# Patient Record
Sex: Male | Born: 1950 | Race: White | Hispanic: No | Marital: Married | Smoking: Former smoker
Health system: Southern US, Community
[De-identification: ages and names within clinical notes are randomized; demographics above are authoritative.]

## PROBLEM LIST (undated history)

## (undated) DIAGNOSIS — K648 Other hemorrhoids: Secondary | ICD-10-CM

## (undated) DIAGNOSIS — Z8619 Personal history of other infectious and parasitic diseases: Secondary | ICD-10-CM

## (undated) DIAGNOSIS — R972 Elevated prostate specific antigen [PSA]: Secondary | ICD-10-CM

## (undated) DIAGNOSIS — K5790 Diverticulosis of intestine, part unspecified, without perforation or abscess without bleeding: Secondary | ICD-10-CM

## (undated) DIAGNOSIS — M1990 Unspecified osteoarthritis, unspecified site: Secondary | ICD-10-CM

## (undated) DIAGNOSIS — G629 Polyneuropathy, unspecified: Secondary | ICD-10-CM

## (undated) DIAGNOSIS — G3184 Mild cognitive impairment, so stated: Secondary | ICD-10-CM

## (undated) DIAGNOSIS — F32A Depression, unspecified: Secondary | ICD-10-CM

## (undated) DIAGNOSIS — F419 Anxiety disorder, unspecified: Secondary | ICD-10-CM

## (undated) DIAGNOSIS — R29898 Other symptoms and signs involving the musculoskeletal system: Secondary | ICD-10-CM

## (undated) DIAGNOSIS — K649 Unspecified hemorrhoids: Secondary | ICD-10-CM

## (undated) DIAGNOSIS — M542 Cervicalgia: Secondary | ICD-10-CM

## (undated) DIAGNOSIS — K449 Diaphragmatic hernia without obstruction or gangrene: Secondary | ICD-10-CM

## (undated) DIAGNOSIS — E785 Hyperlipidemia, unspecified: Secondary | ICD-10-CM

## (undated) DIAGNOSIS — C801 Malignant (primary) neoplasm, unspecified: Secondary | ICD-10-CM

## (undated) DIAGNOSIS — F329 Major depressive disorder, single episode, unspecified: Secondary | ICD-10-CM

## (undated) DIAGNOSIS — K5710 Diverticulosis of small intestine without perforation or abscess without bleeding: Secondary | ICD-10-CM

## (undated) DIAGNOSIS — G8929 Other chronic pain: Secondary | ICD-10-CM

## (undated) HISTORY — DX: Unspecified hemorrhoids: K64.9

## (undated) HISTORY — PX: APPENDECTOMY: SHX54

## (undated) HISTORY — PX: ANTERIOR FUSION CERVICAL SPINE: SUR626

## (undated) HISTORY — PX: REPLACEMENT TOTAL KNEE: SUR1224

## (undated) HISTORY — PX: TONSILLECTOMY AND ADENOIDECTOMY: SUR1326

## (undated) HISTORY — PX: CHOLECYSTECTOMY: SHX55

## (undated) HISTORY — DX: Depression, unspecified: F32.A

## (undated) HISTORY — PX: BACK SURGERY: SHX140

## (undated) HISTORY — DX: Diverticulosis of small intestine without perforation or abscess without bleeding: K57.10

## (undated) HISTORY — PX: JOINT REPLACEMENT: SHX530

## (undated) HISTORY — DX: Diverticulosis of intestine, part unspecified, without perforation or abscess without bleeding: K57.90

## (undated) HISTORY — DX: Other hemorrhoids: K64.8

## (undated) HISTORY — PX: FRACTURE SURGERY: SHX138

## (undated) HISTORY — DX: Diaphragmatic hernia without obstruction or gangrene: K44.9

## (undated) HISTORY — DX: Major depressive disorder, single episode, unspecified: F32.9

## (undated) HISTORY — PX: SHOULDER SURGERY: SHX246

---

## 1988-05-24 HISTORY — PX: ELBOW FRACTURE SURGERY: SHX616

## 2002-05-24 HISTORY — PX: ELBOW FRACTURE SURGERY: SHX616

## 2003-05-25 HISTORY — PX: TOTAL KNEE ARTHROPLASTY: SHX125

## 2004-06-22 ENCOUNTER — Ambulatory Visit (HOSPITAL_COMMUNITY): Admission: RE | Admit: 2004-06-22 | Discharge: 2004-06-22 | Payer: Self-pay | Admitting: Neurosurgery

## 2005-01-15 ENCOUNTER — Encounter: Admission: RE | Admit: 2005-01-15 | Discharge: 2005-01-15 | Payer: Self-pay | Admitting: Orthopedic Surgery

## 2005-01-19 ENCOUNTER — Ambulatory Visit (HOSPITAL_COMMUNITY): Admission: RE | Admit: 2005-01-19 | Discharge: 2005-01-19 | Payer: Self-pay | Admitting: Orthopedic Surgery

## 2005-02-12 ENCOUNTER — Ambulatory Visit (HOSPITAL_COMMUNITY): Admission: RE | Admit: 2005-02-12 | Discharge: 2005-02-12 | Payer: Self-pay | Admitting: Chiropractic Medicine

## 2005-02-15 ENCOUNTER — Ambulatory Visit (HOSPITAL_COMMUNITY): Admission: RE | Admit: 2005-02-15 | Discharge: 2005-02-15 | Payer: Self-pay | Admitting: Sports Medicine

## 2006-05-24 HISTORY — PX: CERVICAL DISCECTOMY: SHX98

## 2007-07-21 ENCOUNTER — Inpatient Hospital Stay (HOSPITAL_COMMUNITY): Admission: AD | Admit: 2007-07-21 | Discharge: 2007-07-25 | Payer: Self-pay | Admitting: Neurosurgery

## 2007-09-21 ENCOUNTER — Encounter: Admission: RE | Admit: 2007-09-21 | Discharge: 2007-09-21 | Payer: Self-pay | Admitting: Neurosurgery

## 2007-09-22 ENCOUNTER — Inpatient Hospital Stay (HOSPITAL_COMMUNITY): Admission: AD | Admit: 2007-09-22 | Discharge: 2007-10-27 | Payer: Self-pay | Admitting: Neurosurgery

## 2007-09-23 ENCOUNTER — Ambulatory Visit: Payer: Self-pay | Admitting: Internal Medicine

## 2007-11-14 ENCOUNTER — Ambulatory Visit: Payer: Self-pay | Admitting: Infectious Diseases

## 2007-11-14 DIAGNOSIS — M8618 Other acute osteomyelitis, other site: Secondary | ICD-10-CM | POA: Insufficient documentation

## 2007-11-14 LAB — CONVERTED CEMR LAB
CO2: 27 meq/L (ref 19–32)
CRP: 0.1 mg/dL (ref <0.6)
Creatinine, Ser: 0.84 mg/dL (ref 0.40–1.50)
Eosinophils Absolute: 1.9 10*3/uL — ABNORMAL HIGH (ref 0.0–0.7)
Eosinophils Relative: 22 % — ABNORMAL HIGH (ref 0–5)
Glucose, Bld: 86 mg/dL (ref 70–99)
HCT: 39.1 % (ref 39.0–52.0)
Lymphs Abs: 2.6 10*3/uL (ref 0.7–4.0)
MCV: 92.7 fL (ref 78.0–100.0)
Platelets: 276 10*3/uL (ref 150–400)
Sed Rate: 4 mm/hr (ref 0–16)
Total Bilirubin: 0.3 mg/dL (ref 0.3–1.2)
WBC: 8.4 10*3/uL (ref 4.0–10.5)

## 2007-11-15 ENCOUNTER — Ambulatory Visit: Payer: Self-pay | Admitting: Infectious Diseases

## 2007-11-16 ENCOUNTER — Encounter: Payer: Self-pay | Admitting: Infectious Diseases

## 2007-11-16 ENCOUNTER — Emergency Department (HOSPITAL_COMMUNITY): Admission: EM | Admit: 2007-11-16 | Discharge: 2007-11-17 | Payer: Self-pay | Admitting: Emergency Medicine

## 2007-11-29 ENCOUNTER — Encounter: Payer: Self-pay | Admitting: Neurosurgery

## 2007-12-05 ENCOUNTER — Telehealth: Payer: Self-pay | Admitting: Infectious Diseases

## 2007-12-14 ENCOUNTER — Ambulatory Visit: Payer: Self-pay | Admitting: Infectious Diseases

## 2008-01-12 ENCOUNTER — Ambulatory Visit: Payer: Self-pay | Admitting: Unknown Physician Specialty

## 2008-01-18 ENCOUNTER — Encounter: Payer: Self-pay | Admitting: Unknown Physician Specialty

## 2008-01-22 ENCOUNTER — Ambulatory Visit: Payer: Self-pay | Admitting: Unknown Physician Specialty

## 2008-01-23 ENCOUNTER — Encounter: Payer: Self-pay | Admitting: Unknown Physician Specialty

## 2008-01-25 ENCOUNTER — Telehealth: Payer: Self-pay | Admitting: Infectious Diseases

## 2008-02-22 ENCOUNTER — Encounter: Payer: Self-pay | Admitting: Unknown Physician Specialty

## 2008-03-07 ENCOUNTER — Telehealth: Payer: Self-pay | Admitting: Infectious Diseases

## 2008-03-11 ENCOUNTER — Encounter: Admission: RE | Admit: 2008-03-11 | Discharge: 2008-03-11 | Payer: Self-pay | Admitting: Neurosurgery

## 2008-05-02 ENCOUNTER — Ambulatory Visit: Payer: Self-pay | Admitting: Family Medicine

## 2008-09-24 ENCOUNTER — Encounter: Payer: Self-pay | Admitting: Infectious Diseases

## 2009-04-24 ENCOUNTER — Encounter: Admission: RE | Admit: 2009-04-24 | Discharge: 2009-04-24 | Payer: Self-pay | Admitting: Neurosurgery

## 2009-05-13 ENCOUNTER — Encounter: Admission: RE | Admit: 2009-05-13 | Discharge: 2009-05-13 | Payer: Self-pay | Admitting: Neurosurgery

## 2009-05-20 ENCOUNTER — Ambulatory Visit: Payer: Self-pay | Admitting: Sports Medicine

## 2009-05-24 HISTORY — PX: ANTERIOR FUSION CERVICAL SPINE: SUR626

## 2009-05-30 ENCOUNTER — Inpatient Hospital Stay (HOSPITAL_COMMUNITY): Admission: RE | Admit: 2009-05-30 | Discharge: 2009-06-04 | Payer: Self-pay | Admitting: Neurosurgery

## 2009-07-28 ENCOUNTER — Encounter: Admission: RE | Admit: 2009-07-28 | Discharge: 2009-07-28 | Payer: Self-pay | Admitting: Neurosurgery

## 2009-08-12 ENCOUNTER — Ambulatory Visit: Payer: Self-pay | Admitting: Internal Medicine

## 2009-08-13 ENCOUNTER — Ambulatory Visit: Payer: Self-pay | Admitting: Internal Medicine

## 2010-06-13 ENCOUNTER — Encounter: Payer: Self-pay | Admitting: Neurosurgery

## 2010-06-14 ENCOUNTER — Encounter: Payer: Self-pay | Admitting: Orthopedic Surgery

## 2010-06-14 ENCOUNTER — Encounter: Payer: Self-pay | Admitting: Family Medicine

## 2010-08-04 ENCOUNTER — Ambulatory Visit: Payer: Self-pay | Admitting: Neurosurgery

## 2010-08-09 LAB — APTT: aPTT: 31 seconds (ref 24–37)

## 2010-08-09 LAB — CBC
HCT: 37 % — ABNORMAL LOW (ref 39.0–52.0)
Hemoglobin: 12.8 g/dL — ABNORMAL LOW (ref 13.0–17.0)
RBC: 3.91 MIL/uL — ABNORMAL LOW (ref 4.22–5.81)
WBC: 5.3 10*3/uL (ref 4.0–10.5)

## 2010-08-09 LAB — PROTIME-INR: INR: 1.03 (ref 0.00–1.49)

## 2010-08-09 LAB — URINALYSIS, ROUTINE W REFLEX MICROSCOPIC
Bilirubin Urine: NEGATIVE
Ketones, ur: NEGATIVE mg/dL
Nitrite: NEGATIVE
pH: 5.5 (ref 5.0–8.0)

## 2010-08-09 LAB — DIFFERENTIAL
Basophils Absolute: 0 10*3/uL (ref 0.0–0.1)
Basophils Relative: 0 % (ref 0–1)
Eosinophils Absolute: 0.1 10*3/uL (ref 0.0–0.7)
Monocytes Absolute: 0.4 10*3/uL (ref 0.1–1.0)
Neutro Abs: 2.7 10*3/uL (ref 1.7–7.7)
Neutrophils Relative %: 51 % (ref 43–77)

## 2010-08-09 LAB — COMPREHENSIVE METABOLIC PANEL
Alkaline Phosphatase: 104 U/L (ref 39–117)
BUN: 11 mg/dL (ref 6–23)
Chloride: 102 mEq/L (ref 96–112)
Glucose, Bld: 95 mg/dL (ref 70–99)
Potassium: 4.3 mEq/L (ref 3.5–5.1)
Total Bilirubin: 0.2 mg/dL — ABNORMAL LOW (ref 0.3–1.2)

## 2010-08-09 LAB — TYPE AND SCREEN: ABO/RH(D): O POS

## 2010-08-09 LAB — MRSA PCR SCREENING: MRSA by PCR: NEGATIVE

## 2010-08-09 LAB — ABO/RH: ABO/RH(D): O POS

## 2010-08-18 ENCOUNTER — Other Ambulatory Visit: Payer: Self-pay | Admitting: Neurosurgery

## 2010-08-18 DIAGNOSIS — M4722 Other spondylosis with radiculopathy, cervical region: Secondary | ICD-10-CM

## 2010-08-21 ENCOUNTER — Ambulatory Visit
Admission: RE | Admit: 2010-08-21 | Discharge: 2010-08-21 | Disposition: A | Discharge status: Home / Self Care | Payer: 59 | Source: Ambulatory Visit | Attending: Neurosurgery | Admitting: Neurosurgery

## 2010-08-21 ENCOUNTER — Other Ambulatory Visit: Payer: Self-pay | Admitting: Neurosurgery

## 2010-08-21 DIAGNOSIS — M4722 Other spondylosis with radiculopathy, cervical region: Secondary | ICD-10-CM

## 2010-10-06 NOTE — Discharge Summary (Signed)
NAME:  Mike Farley, Mike Farley NO.:  [id number]   MEDICAL RECORD NO.:  [id number]          PATIENT TYPE:  INP   LOCATION:  3019                         FACILITY:  [facility]   PHYSICIAN:  Payton Doughty, M.D.      DATE OF BIRTH:  05-14-1951   DATE OF ADMISSION:  09/23/2007  DATE OF DISCHARGE:  10/27/2007                               DISCHARGE SUMMARY   ADMITTING DIAGNOSES:  1. Esophageal injury.  2. Cervical osteomyelitis.   DISCHARGE DIAGNOSES:  1. Esophageal injury.  2. Cervical osteomyelitis.   PROCEDURES:  1. Incision and drainage.  2. Reexploration of cervical osteomyelitis.  3. Wound change by Dr. Venetia Maxon and Dr. Jearld Fenton.   COMPLICATIONS:  None.   DISCHARGE STATUS:  Alive and well per techs.   HOSPITAL COURSE:  A 60 year old gentleman who underwent anterior  decompression fusion in February.  Initially, he did well, developed  intractable neck pain.  Initial films were unremarkable.  He visited Dr.  Venetia Maxon on Sep 23, 2007, and was found to have probable prevertebral  abscess.  Dr. Venetia Maxon admitted him and took him to the operating room and  explored with Dr. Jearld Fenton.  No esophageal leak was noted and it was felt  that he had a walled-off infection.  Hardware was removed and new bone  graft was placed.  Postoperatively, he had a NG tube in for a while,  then because it was felt that he would need this for a while he had a  PEG placed in mid May approximately 15th.  He did well with that.  He  was seen by ID, placed on intravenous antibiotics which kept going and  stayed in his collar and then had a leak study that showed minimal if  any leak.  At that time, he began undergoing dressing changes by Dr.  Jearld Fenton to his neck.  The incision was explored and found to have no  visible leak.  An another swallow study was done that showed minimal if  any leak.  Third swallow done a week later demonstrated no leak and he  was started on clear liquids and a soft diet.  He was then  switched to  regular diet and he has had no leakages, tolerating and swallowing.  He  does have some hoarseness in his voice suggestive of vocal cord paresis,  but he has no difficulty swallowing.  He has been on IV antibiotics for  a month and ID has recommended that he be switched to oral Augmentin,  which is done today.  Swallow study today showed no leak on a regular  diet, so he is being discharge  home to the care of his family.  He will be on Augmentin and Percocet.  He is going to follow up in my office next week and I will arrange for  his follow up visits with Dr. Jearld Fenton to inspect his vocal cord, Dr.  Sampson Goon from infectious disease and with doctor who placed his PEG  for its removal.           ______________________________  Payton Doughty, M.D.  MWR/MEDQ  D:  10/27/2007  T:  10/28/2007  Job:  [id number]

## 2010-10-06 NOTE — Op Note (Signed)
NAME:  Mike Farley, Mike Farley            ACCOUNT NO.:  [id number]   MEDICAL RECORD NO.:  [id number]          PATIENT TYPE:  AMB   LOCATION:  SDS                          FACILITY:  [facility]   PHYSICIAN:  Payton Doughty, M.D.      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   OPERATIVE PROCEDURE:  C6-7 anterior cervical compression fusion with a  Reflex hybrid plate.   SURGEON:  Payton Doughty, M.D.   ANESTHESIA:  General endotracheal.   PREPARATION:  Betadine prep with alcohol wipe.   COMPLICATIONS:  None.   NURSE ASSISTANT:  [place].   PROCEDURE IN DETAIL:  This is a 59 year old gentleman with disk at C6-7  and left C7 radiculopathy.  He has had a prior disk done at C5-6.  He  was taken to the operating room and smoothly anesthetized and intubated  and placed supine on the operating table.  Following shave, prep and  drape in the usual sterile fashion the skin was incised in the midline  at the medial border of the sternocleidomastoid muscle two  fingerbreadths below the carotid tubercle.  The platysma was identified,  elevated, divided and undermined.  Sternocleidomastoid was identified  and medial dissection revealed the carotid artery tracked laterally to  the left, trachea and esophagus retracted laterally to the right  exposing the bones of the anterior cervical spine.  Marker was placed.  Intraoperative x-ray obtained to confirm correctness level and after  confirming correctness of level the anterior osteophyte was removed,  diskectomy carried out under gross observation.  Disk space spreaders  were placed and the operating microscope was then brought in.  We used  microdissection technique to remove the remaining disks, dissect the  posterior annulus, divide the posterior annulus and the posterior  longitudinal ligament and explore the neural foramina bilaterally.  On  the left side there was a disk fragment extending out into the neural  foramen that was removed without difficulty resulting in decompression  of the left C7 root.  Following complete decompression an 8 mm bone  graft was fashioned from patellar allograft and tapped into place.  A 16  mm Reflex hybrid plate was placed with 12 mm screws, two in C6 and two  in C7.  Intraoperative x-ray showed good placement of bone graft, plate  and screws.  The wound was irrigated.  Hemostasis assured.  The esophagus was inspected and found to be free of  lesions.  Successive layers of 2-0 Vicryl and 3-0 Vicryl were used to  close.  Benzoin and Steri-Strips were placed, occlusive Telfa and OpSite  and the patient returned to the recovery room in good condition.           ______________________________  Payton Doughty, M.D.     MWR/MEDQ  D:  07/21/2007  T:  07/22/2007  Job:  [id number]

## 2010-10-06 NOTE — Op Note (Signed)
NAME:  HERNANDEZ, LOSASSO NO.:  [id number]   MEDICAL RECORD NO.:  [id number]          PATIENT TYPE:  INP   LOCATION:  3307                         FACILITY:  [facility]   PHYSICIAN:  Suzanna Obey, M.D.       DATE OF BIRTH:  Oct 11, 1950   DATE OF PROCEDURE:  09/24/2007  DATE OF DISCHARGE:                               OPERATIVE REPORT   This is a note for assistant on a secondary specialty of laryngology  called for evaluation of esophageal leak.  Dr. Venetia Maxon proceeded with the  opening of the neck and removal of hardware and graft.  The esophagus  was examined and there was no obvious perforation identified.  A bougie  tube was placed and the palpation of the esophagus felt like there was  adequate muscle lining around all of the esophagus.  The screw had  pulled out and there may have been a leak in the very posterior midline  aspect of the esophagus which could not be visualized.  There was no  active leakage of any contents.  A feeding tube was placed by palpation  and was positioned into the stomach and auscultation was performed  identifying its position.  The wound was copiously irrigated and Penrose  drains were placed.  The patient had been informed about this aspect of  his esophageal leak and infection.  We discussed the long-term possible  care necessary for this.  He understands the seriousness and risk of  this leak and all of his questions were answered and consent was  obtained for the esophageal component of the surgery.           ______________________________  Suzanna Obey, M.D.     JB/MEDQ  D:  09/24/2007  T:  09/24/2007  Job:  [id number]

## 2010-10-06 NOTE — Consult Note (Signed)
NAME:  Mike, Farley NO.:  [id number]   MEDICAL RECORD NO.:  [id number]           PATIENT TYPE:   LOCATION:                                 FACILITY:   PHYSICIAN:  Mike Heckler, MD      DATE OF BIRTH:  11/26/1950   DATE OF CONSULTATION:  09/30/2007  DATE OF DISCHARGE:                                 CONSULTATION   REFERRING PHYSICIAN:  Clydene Farley, M.D.   CHIEF COMPLAINT:  Need for enteral nutrition, evaluate for gastrostomy  tube placement.   HISTORY OF PRESENT ILLNESS:  Mike Farley is a pleasant 60 year old  white male from [place], [place].  He is a physical therapist.  The patient had had previous cervical fusion performed by Dr. Jeral Farley  many years ago.  The patient underwent a redo anterior cervical fusion  in February 2009 by Dr. Trey Farley.  Postoperative course was complicated  by infection.  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  General surgery is consulted regarding the need to maintain  enteral nutrition in the possibility of gastrostomy tube placement.   PAST MEDICAL HISTORY:  1. Status post cervical anterior fusion.  2. History of anxiety.  3. Status post laparoscopic cholecystectomy.  4. Status post left total knee arthrodesis on the right.   MEDICATIONS:  See chart.   ALLERGIES:  None known.   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   PHYSICAL EXAMINATION:  GENERAL:  A 60 year old well-developed, well-  nourished white male, in no acute distress.  VITAL SIGNS:  Afebrile.  Vital signs stable.  HEENT:  Shows it to be normocephalic, atraumatic.  Sclerae clear.  Conjunctiva clear.  Dentition fair.  Mucous membranes moist.  Voice  normal.  Cervical collar is in place.  Dressing on the left neck with  dry gauze.  No drainage.  LUNGS:  Clear to auscultation bilaterally.  CARDIAC:  Exam shows regular rate and rhythm without murmur.  Peripheral  pulses are full.  EXTREMITIES:  Nontender without edema.  ABDOMEN:  Soft without distention.  Bowel sounds are present.  Well-  healed surgical wounds consistent with previous laparoscopic  cholecystectomy.  There is a small umbilical hernia which is at least  partially reducible and relatively asymptomatic.  Left upper quadrant is  soft, nontender.  No evidence of hepatosplenomegaly.  NEUROLOGICALLY:  The patient is alert and oriented without focal  deficit.   IMPRESSION:  1. Status post anterior fusion cervical spine  2. Postoperative infection with osteomyelitis, development of      esophageal fistula.  3. Need for enteral nutrition, consider gastrostomy tube placement.   PLAN:  I discussed the case with Dr. Colon Farley.  The patient was also  seen today by Dr. Norton Farley from ENT.   PLAN:  Dr. Jearld Farley' plan is to take the patient to the operating room next  week for wound debridement and open packing.  In the interim they plan  to replace the Panda feeding tube to maintain enteral nutrition.  At  this point, there are essentially 3  options.  First option would be to  simply continue feedings per the Panda tube.  Second option would be to  consider endoscopic percutaneous gastrostomy placement, if the  esophageal injury is minor enough to allow for safe intubation of the  esophagus with the endoscope.  We will require guidance from Dr. Suzanna Farley regarding this procedure.  If this is desired potentially Dr.  Jimmye Farley or Dr. Violeta Farley in our practice could perform this  procedure under sedation and local anesthesia.  Finally, open  gastrostomy is a option in the operating room under general anesthesia  if that is felt to be the only option available to maintain enteral  nourishment in this patient.   I have discussed this with Dr. Colon Farley and with the patient.  He  understands.  We will follow up early next week with Dr. Suzanna Farley and  make a final decision regarding the route and  mode of enteral nutrition  for this pleasant patient.      Mike Heckler, MD  Electronically Signed     TMG/MEDQ  D:  09/30/2007  T:  09/30/2007  Job:  [id number]   cc:   Mike Farley, M.D.

## 2010-10-06 NOTE — H&P (Signed)
NAME:  Mike Farley, Mike Farley NO.:  [id number]   MEDICAL RECORD NO.:  [id number]           PATIENT TYPE:   LOCATION:                                 FACILITY:   PHYSICIAN:  Payton Doughty, M.D.           DATE OF BIRTH:   DATE OF ADMISSION:  07/21/2007  DATE OF DISCHARGE:                              HISTORY & PHYSICAL   ADMITTED DIAGNOSIS:  Herniated disk at C6-C7.   This is a very nice 60 year old right-handed white gentleman who in 1990  had an anterior decompression and fusion by Dr. Jeral Fruit at C5-C6.  He has  done well.  He has had increasing pain in his neck and now into his left  arm.  EMG showed a left C7 radiculopathy.  MR shows disk disease at 3-4  and 4-5 and also herniated disk at 6-7.  He has undergone a couple of  epidural steroids to no avail, and is now admitted for an anterior  decompression fusion.   PAST MEDICAL HISTORY:  Unremarkable.   MEDICATIONS:  He uses Seroquel 200 mg at night and aspirin.   ALLERGIES:  He has no allergies.   PAST SURGICAL HISTORY:  Other surgeries include left elbow tenoplasty  and right knee operations and right knee replacement by Dr. Gavin Potters.   SOCIAL HISTORY:  He does not smoke.  He is a light social drinker.  He  is a physical therapist.   REVIEW OF SYSTEMS:  Marked for glasses, sinus problems, headache, arm  pain, neck pain.   PHYSICAL EXAMINATION:  HEENT:  Within normal limits.  NECK:  He has reasonable range of motion in his neck, but turning his  head towards the left does reproduce his arm pain as does bending his  neck to the left.  CHEST:  Clear.  CARDIAC:  Regular rate and rhythm.  ABDOMEN:  Nontender with no hepatosplenomegaly.  EXTREMITIES:  Without clubbing or cyanosis.  Peripheral pulses are good.  GU:  Deferred.  NEUROLOGICAL:  He is awake, alert, and oriented.  His cranial nerves are  intact.  Motor exam shows 5/5 strength throughout the upper and lower  extremities save for left triceps it is 5-/5.   He has left C7 sensory  dysesthesia.  Reflexes are absent at the left triceps, one at the  left  biceps, two at the right triceps, two at the right biceps.  Hoffmann's  is negative.   MR results been reviewed.   CLINICAL IMPRESSION:  Left C7 radiculopathy related to herniated disk.   PLAN:  For an anterior decompression fusion at C6-C7.  The risks and  benefits have been discussed with him.  He wishes to proceed.           ______________________________  Payton Doughty, M.D.     MWR/MEDQ  D:  07/21/2007  T:  07/22/2007  Job:  [id number]

## 2010-10-06 NOTE — Op Note (Signed)
NAME:  Mike Farley, Mike Farley            ACCOUNT NO.:  [id number]   MEDICAL RECORD NO.:  [id number]          PATIENT TYPE:  INP   LOCATION:  3307                         FACILITY:  [facility]   PHYSICIAN:  Danae Orleans. Venetia Maxon, M.D.  DATE OF BIRTH:  29-Mar-1951   DATE OF PROCEDURE:  09/23/2007  DATE OF DISCHARGE:                               OPERATIVE REPORT   PREOPERATIVE DIAGNOSES:  1. Esophageal perforation.  2. Osteomyelitis.  3. Prevertebral abscess following anterior cervical diskectomy and      fusion.   POSTOPERATIVE DIAGNOSES:  1. Esophageal perforation.  2. Osteomyelitis.  3. Prevertebral abscess following anterior cervical diskectomy and      fusion.   PROCEDURE:  1. Exploration of the neck wound.  2. Removal of hardware.  3. Revision of anterior cervical graft.  4. Placement of pan feeding tube   SURGEON:  Maeola Harman, MD.   ASSISTANT:  Suzanna Obey, MD.   ANESTHESIA:  General endotracheal anesthesia.   ESTIMATED BLOOD LOSS:  Minimal.   COMPLICATIONS:  None.   DISPOSITION:  Recovery.   INDICATIONS:  Mike Farley is a 60 year old man who previously  undergone anterior cervical decompression and fusion in February 2009,  who  recently developed progressively more severe neck pain and was  found to have prevertebral infection and osteomyelitis.  Earlier today,  he had a barium swallow which showed an esophageal perforation.  It was  elected to take the patient to surgery for exploration of the neck and  esophageal perforation.   PROCEDURE:  Mr. Betsch was brought to the operating room.  Following  satisfactory and uncomplicated induction of general endotracheal  anesthesia plus intravenous lines, he was placed in a supine position on  the operating table.  His neck was placed in neutral alignment on  horseshoe head holder.  He was placed in 5 pounds of halter traction.  Previous incision was a C6-7 incision on the left side of the midline.  It was reopened after  prepping and draping in the usual sterile fashion  and infiltrating the skin with local lidocaine.  The previous incision  was reopened using blunt dissection.  The prevertebral fascia was  identified after identifying the sternocleidomastoid muscle and then  using blunt dissection to keep the carotid sheath lateral to the  trachea and the esophagus kept medial.  A bougie was placed into the  esophagus when it became very difficult to tell because of densely  matted granulated tissue where the esophagus was and there appeared to  be a ballotable collection which was not easily entered.  The esophagus  appeared to be pushed higher and more medial than it was typical but  knowing its location, both combination of blunt dissection and hemostat  dissection was performed to ensure the pocket which was full of purulent  material directly overlying the previously placed cervical spinal  hardware.  The previously placed Reflex Hybrid Plating System was  removed.  The bone graft that had been previously placed was destroyed  in multiple pieces and these were removed.  There was a significant  amount of infected material within the interspace  and the endplates were  curetted with a variety of Carlens curettes to more normal-appearing  bleeding bone.  There appeared to be epidural granulation tissue and all  this was decompressed.  It was not felt necessary to go all the way to  the spinal cord dura but to remove this epidural granulation tissue at  the base of the interspace which was done very carefully under loop  magnification, but again not to the dura.  Initially, after trial sizing  a 10-mm machine bone allograft, a wedge was used and tamped into  position but it appeared to be too easily removed and subsequently a 12-  mm graft was selected and using in-line traction on the neck, it was  placed in the interspace and tamped into position and this appeared to  be much more securely fit.  I was  not able to dislodge this with a nerve  hook.  I did not want to put any new hardware in because the esophageal  perforation was just overlying the previously placed plate and Dr. Jearld Fenton  was in the opinion that this would likely be the healing of the  esophageal perforation.  At this point, Dr. Jearld Fenton attempted to identify  the source of the esophageal perforation.  It was felt that it was deep  to the esophagus and it would not be easy to identify and he did not  feel this would be something closable, so consequently it was elected to  place a Penrose drain secured with nylon stitches and not to close his  neck wound in an effort to be able to allow any blood or esophageal  contents or saliva to drain freely from the wound.  A bulky dressing was  placed.  The patient was placed in an Aspen collar at the conclusion of  the case.  Prior to closing the wounds and placing the Penrose drain,  the bougie was removed and a Panda tube was place and air was passed in  through the Selmer with auscultation over the stomach.  It was possible  to hear the air within the stomach.  The lateral C-spine radiograph was  obtained.  While it was difficult to visualize the C6-7 level, it  appeared that the bone graft was well-positioned.  An abdominal x-ray  was obtained to demonstrate the Panda tub within the stomach.  The  patient was then extubated in the operating room and taken to recovery  in stable and satisfactory addition having tolerated this operative  procedure well.      Danae Orleans. Venetia Maxon, M.D.  Electronically Signed     JDS/MEDQ  D:  09/23/2007  T:  09/24/2007  Job:  [id number]   cc:   Payton Doughty, M.D.  Suzanna Obey, M.D.

## 2010-10-06 NOTE — Op Note (Signed)
NAME:  Mike Farley, Mike Farley NO.:  [id number]   MEDICAL RECORD NO.:  [id number]          PATIENT TYPE:  INP   LOCATION:  3019                         FACILITY:  [facility]   PHYSICIAN:  Suzanna Obey, M.D.       DATE OF BIRTH:  Dec 13, 1950   DATE OF PROCEDURE:  10/05/2007  DATE OF DISCHARGE:                               OPERATIVE REPORT   PREOPERATIVE DIAGNOSES:  1. Osteomyelitis of the vertebrae.  2. Esophageal fistula.   POSTOPERATIVE DIAGNOSES:  1. Osteomyelitis of the vertebrae.  2. Esophageal fistula.   SURGICAL PROCEDURE:  Removal of Penrose drain with debridement of wound  and examination of cervical spine by Dr. Trey Sailors and packing with a  clean gauze.   ANESTHESIA:  General.   ESTIMATED BLOOD LOSS:  Less than 5 mL.   INDICATIONS:  This is a 60 year old who has had an infection of his  cervical graft and fusion that was operated on last week, and an  infection was found and an esophageal leak was identified on barium  swallow.  He has now had a Penrose drain for a bit over a week and is  still leaking a brownish slick material consistent with likely saliva.  Because of this, it was felt that it would be more advantages for  granulation tissue formation to perform packing changes, so the patient  was brought to the operating room for examination and packing placement.  He was informed of the risks and benefits of the procedure and options  were discussed.  All questions were answered, and consent was obtained.   OPERATION:  The patient was taken to the operating room and placed in  the supine position.  After adequate LMA anesthesia, the Penrose drain  was removed after prepping and draping in the usual sterile manner.  The  wound was opened with finger dissection down to the cervical spine.  Dr.  Trey Sailors examined the graft and the spine and felt like it was solid and  had no evidence of progressing osteomyelitis.  The wound was debrided of  some exudate, and  it actually looked very clean.  There was no obvious  significant saliva within the wound.  The wound was then cleaned up and  then the clean gauze was placed down to the spine and packed the wound.  The patient was then awakened and brought to the recovery room in stable  condition.  Counts were correct.           ______________________________  Suzanna Obey, M.D.    JB/MEDQ  D:  10/05/2007  T:  10/05/2007  Job:  [id number]   cc:   Payton Doughty, M.D.

## 2010-10-06 NOTE — Discharge Summary (Signed)
NAME:  Mike Farley, Mike Farley NO.:  [id number]   MEDICAL RECORD NO.:  [id number]          PATIENT TYPE:  INP   LOCATION:  3016                         FACILITY:  [facility]   PHYSICIAN:  Payton Doughty, M.D.      DATE OF BIRTH:  [birth year]   DATE OF ADMISSION:  07/21/2007  DATE OF DISCHARGE:  07/25/2007                               DISCHARGE SUMMARY   ADMISSION DIAGNOSIS:  Herniated disk, C6-7.   DISCHARGE DIAGNOSIS:  Herniated disk, C6-7.   PROCEDURE:  Anterior cervical decompression and fusion at C6-7.   SURGEON:  Payton Doughty, MD   COMPLICATIONS:  None.   DISCHARGE:  Satisfied.   HISTORY:  A 60 year old right-handed white gentleman.  His history and  physical is recounted on the chart.  He had a 5-6 disk done 18 years  ago.  Has spondylitic disease and left C7 radiculopathy.  MRI shows disk  at 6-7 off the left and he is admitted for an anterior decompression and  fusion.   Exam was intact.  He had a left C7 radiculopathy.  He was admitted after  ascertainment of normal laboratory values and underwent an anterior  decompression and fusion at C6-7.  Postoperatively he did relatively  well, had a fair amount of neck pain, probably owing to the rest of the  spondylytic disease in his neck.  Somewhat slow to mobilize.  Had a  little bit of dysphagia.  Films showed good, patent airway.   By July 24, 2007 he was up and about, eating reasonably well.  Strength  is good.  Because of inclement weather he spent one extra night in the  hospital feeling it was unsafe for him to get out and about in snow and  icy conditions.   On July 25, 2007 he is up and about, awake, alert, and oriented.  His  strength is full.  His incision is dry and well healing.  Airway is  fine.  He is being discharged home to the care of his family with  Percocet for pain.  His followup will be in the [facility] offices in  about 10 days with an x-ray.           ______________________________  Payton Doughty, M.D.     MWR/MEDQ  D:  07/25/2007  T:  07/25/2007  Job:  [id number]

## 2011-02-12 LAB — URINALYSIS, ROUTINE W REFLEX MICROSCOPIC
Bilirubin Urine: NEGATIVE
Glucose, UA: NEGATIVE
Hgb urine dipstick: NEGATIVE
Specific Gravity, Urine: 1.006
Urobilinogen, UA: 0.2

## 2011-02-12 LAB — COMPREHENSIVE METABOLIC PANEL
ALT: 17
AST: 21
Albumin: 4.5
Alkaline Phosphatase: 69
Calcium: 9.7
GFR calc Af Amer: >60
Potassium: 4.8
Sodium: 138
Total Protein: 6.4

## 2011-02-12 LAB — CBC
Hemoglobin: 13.9
MCHC: 34.6
Platelets: 247
RDW: 13

## 2011-02-12 LAB — DIFFERENTIAL
Basophils Relative: 1
Eosinophils Absolute: 0.1
Lymphs Abs: 2.1
Monocytes Absolute: 0.5
Monocytes Relative: 8

## 2011-02-12 LAB — PROTIME-INR: INR: 1

## 2011-02-17 LAB — PREALBUMIN: Prealbumin: 18.7

## 2011-02-17 LAB — BASIC METABOLIC PANEL
BUN: 9
CO2: 32
Calcium: 8.8
Chloride: 102
Creatinine, Ser: 0.74
GFR calc Af Amer: >60
GFR calc non Af Amer: >60
Glucose, Bld: 110 — ABNORMAL HIGH
Potassium: 4
Sodium: 142

## 2011-02-17 LAB — COMPREHENSIVE METABOLIC PANEL
Alkaline Phosphatase: 139 — ABNORMAL HIGH
BUN: 15
CO2: 30
Chloride: 101
Glucose, Bld: 117 — ABNORMAL HIGH
Potassium: 3.5
Total Bilirubin: 0.2 — ABNORMAL LOW

## 2011-02-17 LAB — TSH: TSH: 1.194

## 2011-02-18 LAB — POCT I-STAT, CHEM 8
Calcium, Ion: 1.17
Glucose, Bld: 101 — ABNORMAL HIGH
HCT: 37 — ABNORMAL LOW
TCO2: 26

## 2011-12-16 ENCOUNTER — Ambulatory Visit: Payer: Self-pay | Admitting: Neurosurgery

## 2011-12-28 ENCOUNTER — Ambulatory Visit: Payer: Self-pay | Admitting: Neurosurgery

## 2014-12-31 ENCOUNTER — Other Ambulatory Visit: Payer: Self-pay | Admitting: Family Medicine

## 2015-05-12 LAB — CBC AND DIFFERENTIAL
HCT: 40 % — AB (ref 41–53)
Hemoglobin: 13.5 g/dL (ref 13.5–17.5)
Platelets: 299 10*3/uL (ref 150–399)
WBC: 13.7 10^3/mL

## 2015-05-12 LAB — PSA: PSA: 2.19

## 2015-06-27 ENCOUNTER — Encounter: Payer: Self-pay | Admitting: Internal Medicine

## 2015-06-27 ENCOUNTER — Ambulatory Visit (INDEPENDENT_AMBULATORY_CARE_PROVIDER_SITE_OTHER): Payer: 59 | Admitting: Internal Medicine

## 2015-06-27 VITALS — BP 120/72 | HR 81 | Temp 98.1°F | Resp 18 | Ht 68.0 in | Wt 170.0 lb

## 2015-06-27 DIAGNOSIS — M25561 Pain in right knee: Secondary | ICD-10-CM | POA: Diagnosis not present

## 2015-06-27 DIAGNOSIS — R079 Chest pain, unspecified: Secondary | ICD-10-CM | POA: Diagnosis not present

## 2015-06-27 DIAGNOSIS — G479 Sleep disorder, unspecified: Secondary | ICD-10-CM | POA: Diagnosis not present

## 2015-06-27 DIAGNOSIS — Z23 Encounter for immunization: Secondary | ICD-10-CM

## 2015-06-27 DIAGNOSIS — M542 Cervicalgia: Secondary | ICD-10-CM

## 2015-06-27 DIAGNOSIS — M25562 Pain in left knee: Secondary | ICD-10-CM

## 2015-06-27 DIAGNOSIS — Z0000 Encounter for general adult medical examination without abnormal findings: Secondary | ICD-10-CM

## 2015-06-27 NOTE — Progress Notes (Signed)
Patient ID: Mike Farley, male   DOB: 12/26/1950, 65 y.o.   MRN: [id number]   Subjective:    Patient ID: Mike Farley, male    DOB: 17-May-1951, 65 y.o.   MRN: [id number]  HPI  Patient with past history of previous knee surgeries, neck surgery with esophageal injury and resulting chronic pain.  He comes in today to establish care.  Has been seeing Dr Jeananne Rama.  Also sees Dr Yves Dill for prostate evaluation.  Sees Dr Glenna Fellows for his chronic pain and he prescribes oxycodone.  He stays active.  Exercises regularly.  No chest pain with increased activity.  Does report noticing some sob at times.  He also reports a stabbing chest pain.  Started about 4-6 weeks ago.  Last for a few seconds and resolves.  May occur up to 5-6 times per day.  No cough or congestion.  No acid reflux.  No abdominal pain or cramping.  Bowels stable.  Overdue colonoscopy (per his report).     Past Medical History  Diagnosis Date  . Depression     years ago (minor)   Past Surgical History  Procedure Laterality Date  . Cholecystectomy    . Appendectomy    . Tonsillectomy and adenoidectomy    . Total knee arthroplasty Right 2005    x 4  . Elbow fracture surgery Left 2004  . Cervical discectomy  2008    posterior  . Anterior fusion cervical spine  2011   Family History  Problem Relation Age of Onset  . Arthritis Mother   . Cancer Mother     colon & breast cnacer  . Mental illness Mother     Depression & bipolar  . Cancer Father     lung & prostate cancer  . Alcohol abuse Brother   . Kidney disease Brother    Social History   Social History  . Marital Status: Married    Spouse Name: N/A  . Number of Children: N/A  . Years of Education: N/A   Social History Main Topics  . Smoking status: Former Research scientist (life sciences)  . Smokeless tobacco: Current User  . Alcohol Use: No  . Drug Use: None  . Sexual Activity: Not Asked   Other Topics Concern  . None   Social History Narrative    Outpatient Encounter  Prescriptions as of 06/27/2015  Medication Sig  . Oxycodone HCl 20 MG TABS   . SEROQUEL XR 300 MG 24 hr tablet TAKE ONE TABLET AT BEDTIME  . TESTIM 50 MG/5GM (1%) GEL    No facility-administered encounter medications on file as of 06/27/2015.    Review of Systems  Constitutional: Negative for appetite change and unexpected weight change.  HENT: Negative for congestion and sinus pressure.   Eyes: Negative for pain and visual disturbance.  Respiratory: Positive for shortness of breath. Negative for cough and chest tightness.   Cardiovascular: Positive for chest pain. Negative for palpitations and leg swelling.  Gastrointestinal: Negative for nausea, vomiting, abdominal pain and diarrhea.  Genitourinary: Negative for dysuria and difficulty urinating.  Musculoskeletal: Negative for joint swelling.       Has chronic neck pain.  Followed by Dr Glenna Fellows.   Skin: Negative for color change and rash.  Neurological: Negative for dizziness, light-headedness and headaches.  Hematological: Negative for adenopathy. Does not bruise/bleed easily.  Psychiatric/Behavioral: Negative for dysphoric mood and agitation.       Has been on seroquel for years.  Helps him sleep.  Objective:    Physical Exam  Constitutional: He appears well-developed and well-nourished. No distress.  HENT:  Nose: Nose normal.  Mouth/Throat: Oropharynx is clear and moist.  Eyes: Conjunctivae are normal. Right eye exhibits no discharge. Left eye exhibits no discharge.  Neck: Neck supple. No thyromegaly present.  Cardiovascular: Normal rate and regular rhythm.   Pulmonary/Chest: Effort normal and breath sounds normal. No respiratory distress.  Abdominal: Soft. Bowel sounds are normal. There is no tenderness.  Musculoskeletal: He exhibits no edema or tenderness.  Skin: No rash noted. No erythema.  Psychiatric: He has a normal mood and affect. His behavior is normal.    BP 120/72 mmHg  Pulse 81  Temp(Src) 98.1 F  (36.7 C) (Oral)  Resp 18  Ht 5\' 8"  (1.727 m)  Wt 170 lb (77.111 kg)  BMI 25.85 kg/m2  SpO2 97% Wt Readings from Last 3 Encounters:  06/27/15 170 lb (77.111 kg)  12/14/07 162 lb 4.8 oz (73.619 kg)  11/14/07 161 lb 14.4 oz (73.437 kg)       Assessment & Plan:   Problem List Items Addressed This Visit    Chest pain - Primary    Chest pain as outlined.  EKG revealed SR with no acute ischemic changes.  The sharp pain is atypical.  Does report some sob with exertion at times.  Still exercises regularly.  Will have cardiology evaluate with question of need for any further cardiac w/up.  Check cxr.  Check routine labs, including cholesterol.        Relevant Orders   EKG 12-Lead (Completed)   DG Chest 2 View   Ambulatory referral to Cardiology   Comprehensive metabolic panel   Lipid panel   Health care maintenance    Sees Dr Yves Dill for prostate checks.  He follows psa.  Check cholesterol.  Obtain records when due f/u colonoscopy.        Knee pain    Has had multiple knee surgeries.  Stable pain.  Exercises.  Does not keep him from doing things he needs to do.  Follow.        Neck pain    Has chronic neck pain s/p multiple surgeries.  Sees Dr Carloyn Manner.  He prescribes his pain medication.        Sleep difficulties    Has been on seroquel for years.  Helps him sleep.  Follow.  Sleeps well on the medication.        Relevant Orders   CBC with Differential/Platelet   TSH    Other Visit Diagnoses    Routine general medical examination at a health care facility        Relevant Orders    Varicella zoster antibody, IgG      I spent 45 minutes with the patient and more than 50% of the time was spent in consultation regarding the above.     Einar Pheasant, MD

## 2015-06-27 NOTE — Progress Notes (Signed)
Pre-visit discussion using our clinic review tool. No additional management support is needed unless otherwise documented below in the visit note.  

## 2015-06-29 ENCOUNTER — Encounter: Payer: Self-pay | Admitting: Internal Medicine

## 2015-06-29 DIAGNOSIS — M25569 Pain in unspecified knee: Secondary | ICD-10-CM | POA: Insufficient documentation

## 2015-06-29 DIAGNOSIS — G479 Sleep disorder, unspecified: Secondary | ICD-10-CM | POA: Insufficient documentation

## 2015-06-29 DIAGNOSIS — M542 Cervicalgia: Secondary | ICD-10-CM | POA: Insufficient documentation

## 2015-06-29 DIAGNOSIS — R079 Chest pain, unspecified: Secondary | ICD-10-CM | POA: Insufficient documentation

## 2015-06-29 DIAGNOSIS — Z0000 Encounter for general adult medical examination without abnormal findings: Secondary | ICD-10-CM | POA: Insufficient documentation

## 2015-06-29 NOTE — Assessment & Plan Note (Signed)
Sees Dr Yves Dill for prostate checks.  He follows psa.  Check cholesterol.  Obtain records when due f/u colonoscopy.

## 2015-06-29 NOTE — Assessment & Plan Note (Signed)
Chest pain as outlined.  EKG revealed SR with no acute ischemic changes.  The sharp pain is atypical.  Does report some sob with exertion at times.  Still exercises regularly.  Will have cardiology evaluate with question of need for any further cardiac w/up.  Check cxr.  Check routine labs, including cholesterol.

## 2015-06-29 NOTE — Assessment & Plan Note (Signed)
Has been on seroquel for years.  Helps him sleep.  Follow.  Sleeps well on the medication.

## 2015-06-29 NOTE — Assessment & Plan Note (Signed)
Has had multiple knee surgeries.  Stable pain.  Exercises.  Does not keep him from doing things he needs to do.  Follow.

## 2015-06-29 NOTE — Assessment & Plan Note (Signed)
Has chronic neck pain s/p multiple surgeries.  Sees Dr Carloyn Manner.  He prescribes his pain medication.

## 2015-06-30 ENCOUNTER — Ambulatory Visit
Admission: RE | Admit: 2015-06-30 | Discharge: 2015-06-30 | Disposition: A | Discharge status: Home / Self Care | Payer: 59 | Source: Ambulatory Visit | Attending: Internal Medicine | Admitting: Internal Medicine

## 2015-06-30 DIAGNOSIS — R079 Chest pain, unspecified: Secondary | ICD-10-CM

## 2015-06-30 DIAGNOSIS — M5134 Other intervertebral disc degeneration, thoracic region: Secondary | ICD-10-CM | POA: Insufficient documentation

## 2015-07-14 DIAGNOSIS — R011 Cardiac murmur, unspecified: Secondary | ICD-10-CM | POA: Insufficient documentation

## 2015-07-18 ENCOUNTER — Other Ambulatory Visit (INDEPENDENT_AMBULATORY_CARE_PROVIDER_SITE_OTHER): Payer: 59

## 2015-07-18 DIAGNOSIS — Z0000 Encounter for general adult medical examination without abnormal findings: Secondary | ICD-10-CM

## 2015-07-18 DIAGNOSIS — R079 Chest pain, unspecified: Secondary | ICD-10-CM

## 2015-07-18 DIAGNOSIS — G479 Sleep disorder, unspecified: Secondary | ICD-10-CM

## 2015-07-18 LAB — TSH: TSH: 4.71 u[IU]/mL — AB (ref 0.35–4.50)

## 2015-07-18 LAB — CBC WITH DIFFERENTIAL/PLATELET
BASOS ABS: 0 10*3/uL (ref 0.0–0.1)
Basophils Relative: 0.3 % (ref 0.0–3.0)
EOS PCT: 1.7 % (ref 0.0–5.0)
Eosinophils Absolute: 0.1 10*3/uL (ref 0.0–0.7)
HEMATOCRIT: 41.2 % (ref 39.0–52.0)
HEMOGLOBIN: 13.9 g/dL (ref 13.0–17.0)
LYMPHS PCT: 33.3 % (ref 12.0–46.0)
Lymphs Abs: 2.1 10*3/uL (ref 0.7–4.0)
MCHC: 33.8 g/dL (ref 30.0–36.0)
MCV: 89.4 fl (ref 78.0–100.0)
MONOS PCT: 7.2 % (ref 3.0–12.0)
Monocytes Absolute: 0.5 10*3/uL (ref 0.1–1.0)
NEUTROS PCT: 57.5 % (ref 43.0–77.0)
Neutro Abs: 3.7 10*3/uL (ref 1.4–7.7)
Platelets: 245 10*3/uL (ref 150.0–400.0)
RBC: 4.61 Mil/uL (ref 4.22–5.81)
RDW: 14.1 % (ref 11.5–15.5)
WBC: 6.4 10*3/uL (ref 4.0–10.5)

## 2015-07-18 LAB — LIPID PANEL
CHOL/HDL RATIO: 3
Cholesterol: 204 mg/dL — ABNORMAL HIGH (ref 0–200)
HDL: 71.6 mg/dL (ref >39.00)
LDL Cholesterol: 115 mg/dL — ABNORMAL HIGH (ref 0–99)
NONHDL: 132.59
TRIGLYCERIDES: 89 mg/dL (ref 0.0–149.0)
VLDL: 17.8 mg/dL (ref 0.0–40.0)

## 2015-07-18 LAB — COMPREHENSIVE METABOLIC PANEL
ALBUMIN: 4.6 g/dL (ref 3.5–5.2)
ALK PHOS: 94 U/L (ref 39–117)
ALT: 19 U/L (ref 0–53)
AST: 21 U/L (ref 0–37)
BILIRUBIN TOTAL: 0.5 mg/dL (ref 0.2–1.2)
BUN: 15 mg/dL (ref 6–23)
CO2: 31 mEq/L (ref 19–32)
Calcium: 9.5 mg/dL (ref 8.4–10.5)
Chloride: 102 mEq/L (ref 96–112)
Creatinine, Ser: 0.92 mg/dL (ref 0.40–1.50)
GFR: 87.72 mL/min (ref >60.00)
GLUCOSE: 109 mg/dL — AB (ref 70–99)
POTASSIUM: 3.8 meq/L (ref 3.5–5.1)
Sodium: 140 mEq/L (ref 135–145)
TOTAL PROTEIN: 6.9 g/dL (ref 6.0–8.3)

## 2015-07-20 ENCOUNTER — Other Ambulatory Visit: Payer: Self-pay | Admitting: Internal Medicine

## 2015-07-20 DIAGNOSIS — R7989 Other specified abnormal findings of blood chemistry: Secondary | ICD-10-CM

## 2015-07-20 DIAGNOSIS — R739 Hyperglycemia, unspecified: Secondary | ICD-10-CM

## 2015-07-20 NOTE — Progress Notes (Signed)
Order placed for f/u labs.  

## 2015-07-21 ENCOUNTER — Encounter: Payer: Self-pay | Admitting: *Deleted

## 2015-07-21 LAB — VARICELLA ZOSTER ANTIBODY, IGG: VARICELLA IGG: 2662 {index} — AB (ref <135.00)

## 2015-07-29 ENCOUNTER — Encounter: Payer: Self-pay | Admitting: Internal Medicine

## 2015-09-01 ENCOUNTER — Other Ambulatory Visit: Payer: 59

## 2015-09-04 ENCOUNTER — Other Ambulatory Visit (INDEPENDENT_AMBULATORY_CARE_PROVIDER_SITE_OTHER): Payer: 59

## 2015-09-04 DIAGNOSIS — R946 Abnormal results of thyroid function studies: Secondary | ICD-10-CM | POA: Diagnosis not present

## 2015-09-04 DIAGNOSIS — R739 Hyperglycemia, unspecified: Secondary | ICD-10-CM

## 2015-09-04 DIAGNOSIS — R7989 Other specified abnormal findings of blood chemistry: Secondary | ICD-10-CM

## 2015-09-04 LAB — HEMOGLOBIN A1C: HEMOGLOBIN A1C: 6 % (ref 4.6–6.5)

## 2015-09-04 LAB — TSH: TSH: 5.81 u[IU]/mL — ABNORMAL HIGH (ref 0.35–4.50)

## 2015-09-05 LAB — GLUCOSE, FASTING: GLUCOSE, FASTING: 113 mg/dL — AB (ref 65–99)

## 2015-09-08 ENCOUNTER — Encounter: Payer: Self-pay | Admitting: *Deleted

## 2015-09-08 ENCOUNTER — Other Ambulatory Visit: Payer: 59

## 2015-09-17 ENCOUNTER — Other Ambulatory Visit: Payer: Self-pay

## 2015-09-17 MED ORDER — QUETIAPINE FUMARATE ER 300 MG PO TB24: 300.0000 mg | ORAL_TABLET | Freq: Every day | ORAL | Status: DC

## 2015-09-17 NOTE — Telephone Encounter (Signed)
[facility] requesting refills

## 2015-10-21 ENCOUNTER — Emergency Department
Admission: EM | Admit: 2015-10-21 | Discharge: 2015-10-21 | Disposition: A | Discharge status: Home / Self Care | Payer: 59 | Attending: Emergency Medicine | Admitting: Emergency Medicine

## 2015-10-21 DIAGNOSIS — Y999 Unspecified external cause status: Secondary | ICD-10-CM | POA: Insufficient documentation

## 2015-10-21 DIAGNOSIS — Z87891 Personal history of nicotine dependence: Secondary | ICD-10-CM | POA: Diagnosis not present

## 2015-10-21 DIAGNOSIS — S0101XA Laceration without foreign body of scalp, initial encounter: Secondary | ICD-10-CM | POA: Diagnosis present

## 2015-10-21 DIAGNOSIS — Y939 Activity, unspecified: Secondary | ICD-10-CM | POA: Diagnosis not present

## 2015-10-21 DIAGNOSIS — W2203XA Walked into furniture, initial encounter: Secondary | ICD-10-CM | POA: Diagnosis not present

## 2015-10-21 DIAGNOSIS — Z23 Encounter for immunization: Secondary | ICD-10-CM | POA: Insufficient documentation

## 2015-10-21 DIAGNOSIS — F329 Major depressive disorder, single episode, unspecified: Secondary | ICD-10-CM | POA: Insufficient documentation

## 2015-10-21 DIAGNOSIS — Y929 Unspecified place or not applicable: Secondary | ICD-10-CM | POA: Diagnosis not present

## 2015-10-21 MED ORDER — LIDOCAINE-EPINEPHRINE-TETRACAINE (LET) SOLUTION
3.0000 mL | Freq: Once | NASAL | Status: AC
  Administered 2015-10-21: 3 mL via TOPICAL

## 2015-10-21 MED ORDER — TETANUS-DIPHTH-ACELL PERTUSSIS 5-2.5-18.5 LF-MCG/0.5 IM SUSP
0.5000 mL | Freq: Once | INTRAMUSCULAR | Status: AC
  Administered 2015-10-21: 0.5 mL via INTRAMUSCULAR

## 2015-10-21 NOTE — Discharge Instructions (Signed)
Laceration Care, Adult  A laceration is a cut that goes through all layers of the skin. The cut also goes into the tissue that is right under the skin. Some cuts heal on their own. Others need to be closed with stitches (sutures), staples, skin adhesive strips, or wound glue. Taking care of your cut lowers your risk of infection and helps your cut to heal better.  HOW TO TAKE CARE OF YOUR CUT  For stitches or staples:  · Keep the wound clean and dry.  · If you were given a bandage (dressing), you should change it at least one time per day or as told by your doctor. You should also change it if it gets wet or dirty.  · Keep the wound completely dry for the first 24 hours or as told by your doctor. After that time, you may take a shower or a bath. However, make sure that the wound is not soaked in water until after the stitches or staples have been removed.  · Clean the wound one time each day or as told by your doctor:    Wash the wound with soap and water.    Rinse the wound with water until all of the soap comes off.    Pat the wound dry with a clean towel. Do not rub the wound.  · After you clean the wound, put a thin layer of antibiotic ointment on it as told by your doctor. This ointment:    Helps to prevent infection.    Keeps the bandage from sticking to the wound.  · Have your stitches or staples removed as told by your doctor.  If your doctor used skin adhesive strips:   · Keep the wound clean and dry.  · If you were given a bandage, you should change it at least one time per day or as told by your doctor. You should also change it if it gets dirty or wet.  · Do not get the skin adhesive strips wet. You can take a shower or a bath, but be careful to keep the wound dry.  · If the wound gets wet, pat it dry with a clean towel. Do not rub the wound.  · Skin adhesive strips fall off on their own. You can trim the strips as the wound heals. Do not remove any strips that are still stuck to the wound. They will  fall off after a while.  If your doctor used wound glue:  · Try to keep your wound dry, but you may briefly wet it in the shower or bath. Do not soak the wound in water, such as by swimming.  · After you take a shower or a bath, gently pat the wound dry with a clean towel. Do not rub the wound.  · Do not do any activities that will make you really sweaty until the skin glue has fallen off on its own.  · Do not apply liquid, cream, or ointment medicine to your wound while the skin glue is still on.  · If you were given a bandage, you should change it at least one time per day or as told by your doctor. You should also change it if it gets dirty or wet.  · If a bandage is placed over the wound, do not let the tape for the bandage touch the skin glue.  · Do not pick at the glue. The skin glue usually stays on for 5-10 days. Then, it   or when wound glue stays in place and the wound is healed. Make sure to wear a sunscreen of at least 30 SPF.  Take over-the-counter and prescription medicines only as told by your doctor.  If you were given antibiotic medicine or ointment, take or apply it as told by your doctor. Do not stop using the antibiotic even if your wound is getting better.  Do not scratch or pick at the wound.  Keep all follow-up visits as told by your doctor. This is important.  Check your wound every day for signs of infection. Watch for:  Redness, swelling, or pain.  Fluid, blood, or pus.  Raise (elevate) the injured area above the level of your heart while you are sitting or lying down, if possible. GET HELP IF:  You got a tetanus shot and you have any of these problems at the injection site:  Swelling.  Very bad pain.  Redness.  Bleeding.  You have a fever.  A wound that was  closed breaks open.  You notice a bad smell coming from your wound or your bandage.  You notice something coming out of the wound, such as wood or glass.  Medicine does not help your pain.  You have more redness, swelling, or pain at the site of your wound.  You have fluid, blood, or pus coming from your wound.  You notice a change in the color of your skin near your wound.  You need to change the bandage often because fluid, blood, or pus is coming from the wound.  You start to have a new rash.  You start to have numbness around the wound. GET HELP RIGHT AWAY IF:  You have very bad swelling around the wound.  Your pain suddenly gets worse and is very bad.  You notice painful lumps near the wound or on skin that is anywhere on your body.  You have a red streak going away from your wound.  The wound is on your hand or foot and you cannot move a finger or toe like you usually can.  The wound is on your hand or foot and you notice that your fingers or toes look pale or bluish.   This information is not intended to replace advice given to you by your health care provider. Make sure you discuss any questions you have with your health care provider.   Document Released: 10/27/2007 Document Revised: 09/24/2014 Document Reviewed: 05/06/2014 Elsevier Interactive Patient Education [employer].   See your provider in 10-12 days for staple removal. Keep the area clean with soap (shampoo) and water.

## 2015-10-21 NOTE — ED Notes (Signed)
Pt discharged to home.  Family member driving.  Discharge instructions reviewed.  Verbalized understanding.  No questions or concerns at this time.  Teach back verified.  Pt in NAD.  No items left in ED.   

## 2015-10-21 NOTE — ED Provider Notes (Signed)
[facility] Emergency Department Provider Note ____________________________________________  Time seen: 2017  I have reviewed the triage vital signs and the nursing notes.  HISTORY  Chief Complaint  Head Laceration  HPI Mike Farley is a 65 y.o. male sensitivity ED from a local urgent care center for evaluation management of a laceration to his scalp. Patient describes he was attempting to empty the trash, when he leaned over and actually hit the top of his head on a neck, causing a laceration of top of the scalp. He presents with bleeding currently controlled. He denies any loss of consciousness, nausea, vomiting, dizziness, or headache.He rates his overall discomfort at a 6/10 in triage.  Past Medical History  Diagnosis Date  . Depression     years ago (minor)    Patient Active Problem List   Diagnosis Date Noted  . Chest pain 06/29/2015  . Neck pain 06/29/2015  . Knee pain 06/29/2015  . Sleep difficulties 06/29/2015  . Health care maintenance 06/29/2015  . ACUTE OSTEOMYELITIS OTHER SPECIFIED SITE 11/14/2007    Past Surgical History  Procedure Laterality Date  . Cholecystectomy    . Appendectomy    . Tonsillectomy and adenoidectomy    . Total knee arthroplasty Right 2005    x 4  . Elbow fracture surgery Left 2004  . Cervical discectomy  2008    posterior  . Anterior fusion cervical spine  2011    Current Outpatient Rx  Name  Route  Sig  Dispense  Refill  . Oxycodone HCl 20 MG TABS               . QUEtiapine (SEROQUEL XR) 300 MG 24 hr tablet   Oral   Take 1 tablet (300 mg total) by mouth at bedtime.   30 tablet   1   . TESTIM 50 MG/5GM (1%) GEL                 Dispense as written.     Allergies Amoxicillin  Family History  Problem Relation Age of Onset  . Arthritis Mother   . Cancer Mother     colon & breast cnacer  . Mental illness Mother     Depression & bipolar  . Cancer Father     lung & prostate cancer   . Alcohol abuse Brother   . Kidney disease Brother     Social History Social History  Substance Use Topics  . Smoking status: Former Research scientist (life sciences)  . Smokeless tobacco: Current User  . Alcohol Use: No   Review of Systems  Constitutional: Negative for fever. Musculoskeletal: Negative for back pain. Skin: Negative for rash. Scalp lac as above Neurological: Negative for headaches, focal weakness or numbness. ____________________________________________  PHYSICAL EXAM:  VITAL SIGNS: ED Triage Vitals  Enc Vitals Group     BP --      Pulse --      Resp --      Temp --      Temp src --      SpO2 --      Weight --      Height --      Head Cir --      Peak Flow --      Pain Score --      Pain Loc --      Pain Edu? --      Excl. in [place]? --     Constitutional: Alert and oriented. Well appearing and in no  distress. Head: Normocephalic and atraumatic, except for a linear laceration to the anterior scalp. No active bleeding.  Respiratory: Normal respiratory effort.  Musculoskeletal: Nontender with normal range of motion in all extremities.  Neurologic:  Normal gait without ataxia. Normal speech and language. No gross focal neurologic deficits are appreciated. Skin:  Skin is warm, dry and intact. No rash noted. ____________________________________________  PROCEDURES  Tdap  LACERATION REPAIR Performed by: Melvenia Needles Authorized by: Melvenia Needles Consent: Verbal consent obtained. Risks and benefits: risks, benefits and alternatives were discussed Consent given by: patient Patient identity confirmed: provided demographic data Prepped and Draped in normal sterile fashion Wound explored  Laceration Location: scalp  Laceration Length: 2.5 cm  No Foreign Bodies seen or palpated  Anesthesia: topical infiltration  Local anesthetic: lidocaine-epinephrine-tetracaine  Anesthetic total: 3 ml  Irrigation method: syringe Amount of cleaning:  standard  Skin closure: staples  Number of staples: 4  Patient tolerance: Patient tolerated the procedure well with no immediate complications. ____________________________________________  INITIAL IMPRESSION / ASSESSMENT AND PLAN / ED COURSE  Patient with an acute scalp laceration status post age for repair. He will be discharged wound care instruction will follow-up with his primary care provider and 7010 days for staple removal. ____________________________________________  FINAL CLINICAL IMPRESSION(S) / ED DIAGNOSES  Final diagnoses:  Scalp laceration, initial encounter     Melvenia Needles, PA-C 10/22/15 0133  Lisa Roca, MD 10/24/15 [phone]

## 2015-10-21 NOTE — ED Notes (Signed)
Pt states he hit head on dresser trying to empty trash.  No LOC.

## 2015-10-23 ENCOUNTER — Other Ambulatory Visit: Payer: Self-pay | Admitting: Family Medicine

## 2015-10-23 DIAGNOSIS — W19XXXS Unspecified fall, sequela: Secondary | ICD-10-CM

## 2015-10-23 NOTE — Progress Notes (Signed)
Patient fell several weeks ago on right shoulder now painful with some limited range of motion will get x-ray and consider referral.

## 2015-10-24 ENCOUNTER — Ambulatory Visit
Admission: RE | Admit: 2015-10-24 | Discharge: 2015-10-24 | Disposition: A | Discharge status: Home / Self Care | Payer: 59 | Source: Ambulatory Visit | Attending: Family Medicine | Admitting: Family Medicine

## 2015-10-24 DIAGNOSIS — M25511 Pain in right shoulder: Secondary | ICD-10-CM | POA: Insufficient documentation

## 2015-10-24 DIAGNOSIS — W19XXXS Unspecified fall, sequela: Secondary | ICD-10-CM

## 2015-10-27 ENCOUNTER — Encounter: Payer: Self-pay | Admitting: Family Medicine

## 2015-10-28 ENCOUNTER — Ambulatory Visit (INDEPENDENT_AMBULATORY_CARE_PROVIDER_SITE_OTHER): Payer: 59 | Admitting: Internal Medicine

## 2015-10-28 ENCOUNTER — Encounter: Payer: Self-pay | Admitting: Internal Medicine

## 2015-10-28 VITALS — BP 110/70 | HR 64 | Temp 98.6°F | Wt 164.8 lb

## 2015-10-28 DIAGNOSIS — R079 Chest pain, unspecified: Secondary | ICD-10-CM

## 2015-10-28 DIAGNOSIS — S0101XD Laceration without foreign body of scalp, subsequent encounter: Secondary | ICD-10-CM | POA: Diagnosis not present

## 2015-10-28 DIAGNOSIS — M25511 Pain in right shoulder: Secondary | ICD-10-CM

## 2015-10-28 MED ORDER — MELOXICAM 7.5 MG PO TABS: 7.5000 mg | ORAL_TABLET | Freq: Every day | ORAL | Status: DC

## 2015-10-28 NOTE — Progress Notes (Signed)
Pre visit review using our clinic review tool, if applicable. No additional management support is needed unless otherwise documented below in the visit note. 

## 2015-10-28 NOTE — Progress Notes (Signed)
Patient ID: Mike Farley, male   DOB: [birth year], 65 y.o.   MRN: [id number]   Subjective:    Patient ID: Mike Farley, male    DOB: 1950/09/04, 65 y.o.   MRN: [id number]  HPI  Patient here for a scheduled follow up.  Recently evaluated in ER for scalp laceration.  Staples in place.  Here to have removed.  No headache.  No dizziness or light headedness.  No chest pain. Saw cardiology.  Negative stress test.  No further chest pain.  No sob.  No acid reflux.  No abdominal pain or cramping.  Bowels stable.  Reports increased right shoulder pain.  Persistent.  Has been doing therapy.  Persistent pain.  Request referral to ortho.     Past Medical History  Diagnosis Date  . Depression     years ago (minor)   Past Surgical History  Procedure Laterality Date  . Cholecystectomy    . Appendectomy    . Tonsillectomy and adenoidectomy    . Total knee arthroplasty Right 2005    x 4  . Elbow fracture surgery Left 2004  . Cervical discectomy  2008    posterior  . Anterior fusion cervical spine  2011   Family History  Problem Relation Age of Onset  . Arthritis Mother   . Cancer Mother     colon & breast cnacer  . Mental illness Mother     Depression & bipolar  . Cancer Father     lung & prostate cancer  . Alcohol abuse Brother   . Kidney disease Brother    Social History   Social History  . Marital Status: Married    Spouse Name: N/A  . Number of Children: N/A  . Years of Education: N/A   Social History Main Topics  . Smoking status: Former Research scientist (life sciences)  . Smokeless tobacco: Current User  . Alcohol Use: No  . Drug Use: None  . Sexual Activity: Not Asked   Other Topics Concern  . None   Social History Narrative    Outpatient Encounter Prescriptions as of 10/28/2015  Medication Sig  . Oxycodone HCl 20 MG TABS   . QUEtiapine (SEROQUEL XR) 300 MG 24 hr tablet Take 1 tablet (300 mg total) by mouth at bedtime.  . TESTIM 50 MG/5GM (1%) GEL   . meloxicam (MOBIC) 7.5 MG tablet  Take 1 tablet (7.5 mg total) by mouth daily.   No facility-administered encounter medications on file as of 10/28/2015.    Review of Systems  Constitutional: Negative for appetite change and unexpected weight change.  HENT: Negative for congestion and sinus pressure.   Respiratory: Negative for cough, chest tightness and shortness of breath.   Cardiovascular: Negative for chest pain, palpitations and leg swelling.  Gastrointestinal: Negative for nausea, vomiting, abdominal pain and diarrhea.  Genitourinary: Negative for dysuria and difficulty urinating.  Musculoskeletal: Negative for back pain.       Right shoulder pain as outlined.   Skin: Negative for color change and rash.  Neurological: Negative for dizziness, light-headedness and headaches.  Psychiatric/Behavioral: Negative for dysphoric mood and agitation.       Objective:    Physical Exam  Constitutional: He appears well-developed and well-nourished. No distress.  HENT:  Nose: Nose normal.  Mouth/Throat: Oropharynx is clear and moist.  Neck: Neck supple. No thyromegaly present.  Cardiovascular: Normal rate and regular rhythm.   Pulmonary/Chest: Effort normal and breath sounds normal. No respiratory distress.  Abdominal: Soft. Bowel sounds are  normal. There is no tenderness.  Musculoskeletal: He exhibits no edema or tenderness.  Increased right shoulder pain with full extension.    Lymphadenopathy:    He has no cervical adenopathy.  Skin: No rash noted. No erythema.  Psychiatric: He has a normal mood and affect. His behavior is normal.    BP 110/70 mmHg  Pulse 64  Temp(Src) 98.6 F (37 C) (Oral)  Wt 164 lb 12.8 oz (74.753 kg) Wt Readings from Last 3 Encounters:  10/28/15 164 lb 12.8 oz (74.753 kg)  10/21/15 164 lb (74.39 kg)  06/27/15 170 lb (77.111 kg)     Lab Results  Component Value Date   WBC 6.4 07/18/2015   HGB 13.9 07/18/2015   HCT 41.2 07/18/2015   PLT 245.0 07/18/2015   GLUCOSE 109* 07/18/2015    CHOL 204* 07/18/2015   TRIG 89.0 07/18/2015   HDL 71.60 07/18/2015   LDLCALC 115* 07/18/2015   ALT 19 07/18/2015   AST 21 07/18/2015   NA 140 07/18/2015   K 3.8 07/18/2015   CL 102 07/18/2015   CREATININE 0.92 07/18/2015   BUN 15 07/18/2015   CO2 31 07/18/2015   TSH 5.81* 09/04/2015   PSA 2.19 05/12/2015   INR 1.03 05/29/2009   HGBA1C 6.0 09/04/2015    Dg Shoulder Right  10/24/2015  CLINICAL DATA:  Pain after fall 6 weeks ago EXAM: RIGHT SHOULDER - 2+ VIEW COMPARISON:  None. FINDINGS: Degenerative changes in the [facility] joint.  No fracture or dislocation. IMPRESSION: Degenerative changes.  No fractures. Electronically Signed   By: Dorise Bullion III M.D   On: 10/24/2015 16:24       Assessment & Plan:   Problem List Items Addressed This Visit    Chest pain    Saw cardiology.  Negative stress test.  No further chest pain.        Right shoulder pain - Primary    Degenerative changes on xray.  Has been doing therapy.  Persistent pain.  Refer to Dr Roland Rack.        Relevant Orders   Ambulatory referral to Orthopedic Surgery   Scalp laceration    Staples in place.  Removed.  He tolerated well.          I spent 25 minutes with the patient and more than 50% of the time was spent in consultation regarding the above.     Einar Pheasant, MD

## 2015-11-02 ENCOUNTER — Encounter: Payer: Self-pay | Admitting: Internal Medicine

## 2015-11-02 DIAGNOSIS — S0101XA Laceration without foreign body of scalp, initial encounter: Secondary | ICD-10-CM | POA: Insufficient documentation

## 2015-11-02 DIAGNOSIS — Z9889 Other specified postprocedural states: Secondary | ICD-10-CM | POA: Insufficient documentation

## 2015-11-02 NOTE — Assessment & Plan Note (Signed)
Saw cardiology.  Negative stress test.  No further chest pain.

## 2015-11-02 NOTE — Assessment & Plan Note (Signed)
Degenerative changes on xray.  Has been doing therapy.  Persistent pain.  Refer to Dr Roland Rack.

## 2015-11-02 NOTE — Assessment & Plan Note (Signed)
Staples in place.  Removed.  He tolerated well.

## 2015-11-19 ENCOUNTER — Telehealth: Payer: Self-pay | Admitting: Internal Medicine

## 2015-11-19 ENCOUNTER — Ambulatory Visit: Payer: Self-pay | Admitting: Family Medicine

## 2015-11-19 NOTE — Telephone Encounter (Signed)
Attempted to reach patient, left a vM to return my call. thanks

## 2015-11-19 NOTE — Telephone Encounter (Signed)
Pt lvm on my extension because he has a question for Dr. Nicki Reaper about his Seroquel.

## 2015-11-20 ENCOUNTER — Telehealth: Payer: Self-pay | Admitting: *Deleted

## 2015-11-20 MED ORDER — QUETIAPINE FUMARATE ER 300 MG PO TB24: 300.0000 mg | ORAL_TABLET | Freq: Every day | ORAL | Status: DC

## 2015-11-20 NOTE — Telephone Encounter (Signed)
Refilled

## 2015-11-20 NOTE — Telephone Encounter (Signed)
Patient has requested a medication refill for seroquel Mike Farley

## 2015-11-24 NOTE — Telephone Encounter (Signed)
Attempted to reach patient, left VM. thanks

## 2016-01-15 ENCOUNTER — Other Ambulatory Visit: Payer: Self-pay | Admitting: Internal Medicine

## 2016-03-08 ENCOUNTER — Other Ambulatory Visit: Payer: Self-pay | Admitting: Internal Medicine

## 2016-05-07 ENCOUNTER — Telehealth: Payer: Self-pay | Admitting: Internal Medicine

## 2016-05-07 NOTE — Telephone Encounter (Signed)
Pt called and wanted is discuss a medication change. He is getting ready to retire and the SEROQUEL XR 300 MG 24 hr tablet will be too much money. Pharmacists advise that he call and see if Dr. Nicki Reaper could prescribe something equivalent without the Xr. Please advise, thank you!  Call [phone]

## 2016-05-07 NOTE — Telephone Encounter (Signed)
Please advise 

## 2016-05-08 NOTE — Telephone Encounter (Signed)
Please call pharmacy and see if we can change to seroquel 300mg  q day.  Will insurance cover this medication?  They should be able to let us know about insurance coverage.  Thanks

## 2016-05-11 NOTE — Telephone Encounter (Signed)
Spoke with Nicole Kindred at [place]. She wanted to make sure that the instructions was the same. I told her that Dr. Nicki Reaper kept it the same

## 2016-05-11 NOTE — Telephone Encounter (Signed)
Called pharmacy and changed to regular instead of extended release. LM on patients phone about the change and if there is any problems to let us know.

## 2016-05-11 NOTE — Telephone Encounter (Signed)
[facility] pharmacy has requested a returned call in reference to the Rx of [place] [phone]

## 2016-05-11 NOTE — Telephone Encounter (Signed)
Pt requested a update  Pt contact [phone]

## 2016-05-12 ENCOUNTER — Telehealth: Payer: Self-pay | Admitting: Internal Medicine

## 2016-05-12 NOTE — Telephone Encounter (Signed)
Called patient back urged to go to [facility] he declined at this time. If symptoms woresn he will . Patient states he has not taken temperature, cough no productive.  Please advise on what you would suggest.

## 2016-05-12 NOTE — Telephone Encounter (Signed)
Pt called and needed to reschedule an appt that he had with Dr. Lacinda Axon on Friday. Pt rescheduled with M. Arnett for 12/26, but in the mean time pt wants to know if Dr. Nicki Reaper would call something in for him. Pt is c/o a low grade fever, chest congestion, and green phlegm. Please advise, thank you!  [place], [place]  Call pt @ [phone]

## 2016-05-12 NOTE — Telephone Encounter (Signed)
Agree with need for evaluation given fever, etc.

## 2016-05-13 ENCOUNTER — Telehealth: Payer: Self-pay | Admitting: Internal Medicine

## 2016-05-13 NOTE — Telephone Encounter (Signed)
Pt returning Kristen's phone call. Please cb [phone]

## 2016-05-13 NOTE — Telephone Encounter (Signed)
Spoke with patient and advised him to go to urgent care for evaluation.   He verbalized understanding.

## 2016-05-13 NOTE — Telephone Encounter (Signed)
Left message to call.

## 2016-05-13 NOTE — Telephone Encounter (Signed)
See previous telephone encounter documentation dated 05/07/16

## 2016-05-14 ENCOUNTER — Ambulatory Visit: Payer: 59 | Admitting: Family Medicine

## 2016-05-18 ENCOUNTER — Ambulatory Visit: Payer: 59 | Admitting: Family

## 2016-05-24 HISTORY — PX: ROTATOR CUFF REPAIR: SHX139

## 2016-06-11 DIAGNOSIS — M4302 Spondylolysis, cervical region: Secondary | ICD-10-CM | POA: Diagnosis not present

## 2016-06-11 DIAGNOSIS — M4722 Other spondylosis with radiculopathy, cervical region: Secondary | ICD-10-CM | POA: Diagnosis not present

## 2016-06-11 DIAGNOSIS — M5030 Other cervical disc degeneration, unspecified cervical region: Secondary | ICD-10-CM | POA: Diagnosis not present

## 2016-07-15 DIAGNOSIS — M19011 Primary osteoarthritis, right shoulder: Secondary | ICD-10-CM | POA: Diagnosis not present

## 2016-07-15 DIAGNOSIS — G8929 Other chronic pain: Secondary | ICD-10-CM | POA: Diagnosis not present

## 2016-07-15 DIAGNOSIS — M75121 Complete rotator cuff tear or rupture of right shoulder, not specified as traumatic: Secondary | ICD-10-CM | POA: Diagnosis not present

## 2016-07-15 DIAGNOSIS — M25511 Pain in right shoulder: Secondary | ICD-10-CM | POA: Diagnosis not present

## 2016-09-04 ENCOUNTER — Other Ambulatory Visit: Payer: Self-pay | Admitting: Internal Medicine

## 2016-09-06 NOTE — Telephone Encounter (Signed)
I don't see this on his current med list, or an old list, please advise for refill, thanks

## 2016-09-07 NOTE — Telephone Encounter (Signed)
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

## 2016-09-07 NOTE — Telephone Encounter (Signed)
Please call pt and see why needed.  Let him know we do not have this on his med list.

## 2016-09-08 NOTE — Telephone Encounter (Signed)
Ok's refill for valtrex.

## 2016-09-10 DIAGNOSIS — G894 Chronic pain syndrome: Secondary | ICD-10-CM | POA: Diagnosis not present

## 2016-09-10 DIAGNOSIS — M4302 Spondylolysis, cervical region: Secondary | ICD-10-CM | POA: Diagnosis not present

## 2016-09-10 DIAGNOSIS — M4722 Other spondylosis with radiculopathy, cervical region: Secondary | ICD-10-CM | POA: Diagnosis not present

## 2016-10-12 DIAGNOSIS — M19011 Primary osteoarthritis, right shoulder: Secondary | ICD-10-CM | POA: Diagnosis not present

## 2016-10-12 DIAGNOSIS — F1190 Opioid use, unspecified, uncomplicated: Secondary | ICD-10-CM | POA: Insufficient documentation

## 2016-10-12 DIAGNOSIS — M75121 Complete rotator cuff tear or rupture of right shoulder, not specified as traumatic: Secondary | ICD-10-CM | POA: Diagnosis not present

## 2016-10-20 ENCOUNTER — Other Ambulatory Visit: Payer: Self-pay | Admitting: Internal Medicine

## 2016-10-20 NOTE — Telephone Encounter (Signed)
ok'd rx for seroquel #30 with 1 refill.  Needs physical scheduled within the next 2 months.

## 2016-10-20 NOTE — Telephone Encounter (Signed)
Last given 03/08/16 Last o/v 10/28/15 No f/u

## 2016-10-22 ENCOUNTER — Encounter: Payer: Self-pay | Admitting: Emergency Medicine

## 2016-10-22 ENCOUNTER — Emergency Department
Admission: EM | Admit: 2016-10-22 | Discharge: 2016-10-22 | Disposition: A | Discharge status: Home / Self Care | Payer: Medicare Other | Attending: Emergency Medicine | Admitting: Emergency Medicine

## 2016-10-22 DIAGNOSIS — Y999 Unspecified external cause status: Secondary | ICD-10-CM | POA: Diagnosis not present

## 2016-10-22 DIAGNOSIS — Y939 Activity, unspecified: Secondary | ICD-10-CM | POA: Diagnosis not present

## 2016-10-22 DIAGNOSIS — F17290 Nicotine dependence, other tobacco product, uncomplicated: Secondary | ICD-10-CM | POA: Diagnosis not present

## 2016-10-22 DIAGNOSIS — Y929 Unspecified place or not applicable: Secondary | ICD-10-CM | POA: Diagnosis not present

## 2016-10-22 DIAGNOSIS — S0502XA Injury of conjunctiva and corneal abrasion without foreign body, left eye, initial encounter: Secondary | ICD-10-CM | POA: Insufficient documentation

## 2016-10-22 DIAGNOSIS — X58XXXA Exposure to other specified factors, initial encounter: Secondary | ICD-10-CM | POA: Insufficient documentation

## 2016-10-22 DIAGNOSIS — Z79899 Other long term (current) drug therapy: Secondary | ICD-10-CM | POA: Insufficient documentation

## 2016-10-22 DIAGNOSIS — S0592XA Unspecified injury of left eye and orbit, initial encounter: Secondary | ICD-10-CM | POA: Diagnosis present

## 2016-10-22 MED ORDER — EYE WASH OPHTH SOLN
1.0000 [drp] | OPHTHALMIC | Status: DC | PRN
  Filled 2016-10-22: qty 118

## 2016-10-22 MED ORDER — DICLOFENAC SODIUM 0.1 % OP SOLN: 1.0000 [drp] | Freq: Four times a day (QID) | OPHTHALMIC | 0 refills | Status: DC

## 2016-10-22 MED ORDER — TETRACAINE HCL 0.5 % OP SOLN
1.0000 [drp] | Freq: Once | OPHTHALMIC | Status: AC
  Administered 2016-10-22: 1 [drp] via OPHTHALMIC

## 2016-10-22 MED ORDER — HYDROCODONE-ACETAMINOPHEN 5-325 MG PO TABS: 1.0000 | ORAL_TABLET | ORAL | 0 refills | Status: DC | PRN

## 2016-10-22 MED ORDER — FLUORESCEIN SODIUM 0.6 MG OP STRP
ORAL_STRIP | OPHTHALMIC | Status: AC
  Filled 2016-10-22: qty 1

## 2016-10-22 MED ORDER — FLUORESCEIN SODIUM 0.6 MG OP STRP
1.0000 | ORAL_STRIP | Freq: Once | OPHTHALMIC | Status: AC
  Administered 2016-10-22: 1 via OPHTHALMIC

## 2016-10-22 MED ORDER — TOBRAMYCIN 0.3 % OP SOLN: 2.0000 [drp] | OPHTHALMIC | 0 refills | Status: DC

## 2016-10-22 MED ORDER — TETRACAINE HCL 0.5 % OP SOLN
OPHTHALMIC | Status: AC
  Filled 2016-10-22: qty 2

## 2016-10-22 NOTE — ED Triage Notes (Addendum)
Pt presents to ED via wheelchair c/o left eye pain since this morning,. Pt denies any known injury to eye. States has tried rinsing eye out without relief. Pt denies welding, states feels like his eye is burning.

## 2016-10-22 NOTE — ED Provider Notes (Signed)
[facility] Emergency Department Provider Note ____________________________________________  Time seen: 7:03 AM  I have reviewed the triage vital signs and the nursing notes.  HISTORY  Chief Complaint  Eye Pain   HPI Mike Farley is a 66 y.o. male is here complaining of left eye pain since this morning. Patient states that he was working with some flowers last evening and went to bed without any eye pain. He is unaware of any foreign body and denies any photophobia. He states that he does have sensation of a foreign body in his eye. Patient tried rinsing his eye out at home without any relief. He rates his pain as a 9/10.     Past Medical History:  Diagnosis Date  . Depression    years ago (minor)    Patient Active Problem List   Diagnosis Date Noted  . Scalp laceration 11/02/2015  . Right shoulder pain 11/02/2015  . Chest pain 06/29/2015  . Neck pain 06/29/2015  . Knee pain 06/29/2015  . Sleep difficulties 06/29/2015  . Health care maintenance 06/29/2015  . ACUTE OSTEOMYELITIS OTHER SPECIFIED SITE 11/14/2007    Past Surgical History:  Procedure Laterality Date  . ANTERIOR FUSION CERVICAL SPINE  2011  . APPENDECTOMY    . CERVICAL DISCECTOMY  2008   posterior  . CHOLECYSTECTOMY    . ELBOW FRACTURE SURGERY Left 2004  . TONSILLECTOMY AND ADENOIDECTOMY    . TOTAL KNEE ARTHROPLASTY Right 2005   x 4    Prior to Admission medications   Medication Sig Start Date End Date Taking? Authorizing Provider  diclofenac (VOLTAREN) 0.1 % ophthalmic solution Place 1 drop into the left eye 4 (four) times daily. 10/22/16   Johnn Hai, PA-C  HYDROcodone-acetaminophen (NORCO/VICODIN) 5-325 MG tablet Take 1 tablet by mouth every 4 (four) hours as needed for moderate pain. 10/22/16   Johnn Hai, PA-C  meloxicam (MOBIC) 7.5 MG tablet Take 1 tablet (7.5 mg total) by mouth daily. 10/28/15   Einar Pheasant, MD  Oxycodone HCl 20 MG TABS  05/27/10    [provider]  QUEtiapine (SEROQUEL) 300 MG tablet TAKE 1 TABLET BY MOUTH AT BEDTIME 10/20/16   Einar Pheasant, MD  SEROQUEL XR 300 MG 24 hr tablet TAKE 1 TABLET BY MOUTH AT BEDTIME 03/08/16   Einar Pheasant, MD  TESTIM 50 MG/5GM (1%) GEL  05/08/15   [provider]  tobramycin (TOBREX) 0.3 % ophthalmic solution Place 2 drops into the left eye every 4 (four) hours. 10/22/16   Johnn Hai, PA-C  valACYclovir (VALTREX) 500 MG tablet TAKE ONE TABLET TWICE A DAY 09/08/16   Einar Pheasant, MD    Allergies Amoxicillin  Family History  Problem Relation Age of Onset  . Arthritis Mother   . Cancer Mother        colon & breast cnacer  . Mental illness Mother        Depression & bipolar  . Cancer Father        lung & prostate cancer  . Alcohol abuse Brother   . Kidney disease Brother     Social History Social History  Substance Use Topics  . Smoking status: Former Research scientist (life sciences)  . Smokeless tobacco: Current User  . Alcohol use No    Review of Systems  Constitutional: Negative for fever. Eyes: Positive for left eye pain. ENT: Negative for sore throat. Cardiovascular: Negative for chest pain. Respiratory: Negative for shortness of breath. Skin: Negative for rash. Neurological: Negative for  headaches, focal weakness or numbness. ____________________________________________  PHYSICAL EXAM:  VITAL SIGNS: ED Triage Vitals  Enc Vitals Group     BP 10/22/16 0651 (!) 154/88     Pulse Rate 10/22/16 0651 80     Resp 10/22/16 0651 20     Temp 10/22/16 0651 98 F (36.7 C)     Temp Source 10/22/16 0651 Oral     SpO2 10/22/16 0651 99 %     Weight 10/22/16 0649 164 lb (74.4 kg)     Height 10/22/16 0649 5\' 9"  (1.753 m)     Head Circumference --      Peak Flow --      Pain Score 10/22/16 0649 9     Pain Loc --      Pain Edu? --      Excl. in [place]? --     Constitutional: Alert and oriented. Well appearing and in no distress. Head: Normocephalic and  atraumatic. Eyes: Left conjunctiva is minimally injected. No obvious foreign bodies noted. PERRL. Normal extraocular movements. Tetracaine was placed in the eye. Upper lid was everted no foreign body was noted. No obvious injury was present. Fluorescein stain was placed and at approximately 5:00 there is a very superficial corneal abrasion without foreign body present. Visual acuity was checked and documented by the nurse.  I was flushed with eyewash. Neck: Supple.  Cardiovascular: Normal rate, regular rhythm. Normal distal pulses. Respiratory: Normal respiratory effort. No wheezes/rales/rhonchi. Neurologic:  Normal gait without ataxia. Normal speech and language. No gross focal neurologic deficits are appreciated. Skin:  Skin is warm, dry and intact. No rash noted. Psychiatric: Mood and affect are normal. Patient exhibits appropriate insight and judgment.   INITIAL IMPRESSION / ASSESSMENT AND PLAN / ED COURSE  Patient tolerated I exam well and stated that after the tetracaine he had no continued pain. He was given a prescription for Tobrex ophthalmic solution 2 drops every 4 hours while awake. Patient was also given a prescription for Norco as needed for pain #12 no refill. Patient requested a number eyedrops for pain. He was given a prescription for Voltaren eyedrops however these were not obtainable at the pharmacy. Patient was called in a prescription for Acular 1 drop left eye 4 times a day. If he continues to have any problems with his eye he is to follow-up with our [place].    ____________________________________________  FINAL CLINICAL IMPRESSION(S) / ED DIAGNOSES  Final diagnoses:  Abrasion of left cornea, initial encounter     Johnn Hai, PA-C 10/22/16 1115    Schuyler Amor, MD 10/22/16 1622

## 2016-10-22 NOTE — Discharge Instructions (Signed)
Follow up with [facility] if any continued problems Norco as needed for pain.  Tobrex to left eye every 4 hours while awake. Wear sunglasses for protection from bright light

## 2016-10-22 NOTE — ED Notes (Signed)
Visual Acuity Screen performed per this RN:  Left Eye 20/50 Right Eye 20/30

## 2016-12-13 ENCOUNTER — Other Ambulatory Visit: Payer: Self-pay | Admitting: Internal Medicine

## 2016-12-13 NOTE — Telephone Encounter (Signed)
Last OV was 10/28/15, please advise for refill, no follow up scheduled, thanks

## 2016-12-14 NOTE — Telephone Encounter (Signed)
Please schedule f/u appt.  Once scheduled, ok to refill until appt.

## 2016-12-14 NOTE — Telephone Encounter (Signed)
Left a message with family member for patient to return a call to schedule a follow up appt with Dr. Nicki Reaper to get refill on medications. Thanks

## 2016-12-15 NOTE — Telephone Encounter (Signed)
Pt wants to know if he can have a refill pt appt was made. Please advise? SEROQUEL XR 300 MG 24 hr tablet  Pharmacy is [place], [place]  Call pt @ [phone]. Thank you!

## 2016-12-15 NOTE — Telephone Encounter (Signed)
Ok to refill 

## 2016-12-15 NOTE — Telephone Encounter (Signed)
Pt called and scheduled an appt for 10/5 @ 3 pm.

## 2016-12-15 NOTE — Telephone Encounter (Signed)
Pharmacy called requesting this medication. Please advise, thank you!

## 2016-12-22 DIAGNOSIS — M4722 Other spondylosis with radiculopathy, cervical region: Secondary | ICD-10-CM | POA: Diagnosis not present

## 2017-01-08 DIAGNOSIS — M67921 Unspecified disorder of synovium and tendon, right upper arm: Secondary | ICD-10-CM | POA: Insufficient documentation

## 2017-01-08 DIAGNOSIS — M19011 Primary osteoarthritis, right shoulder: Secondary | ICD-10-CM | POA: Insufficient documentation

## 2017-01-08 DIAGNOSIS — M7541 Impingement syndrome of right shoulder: Secondary | ICD-10-CM | POA: Insufficient documentation

## 2017-01-13 DIAGNOSIS — N401 Enlarged prostate with lower urinary tract symptoms: Secondary | ICD-10-CM | POA: Diagnosis not present

## 2017-01-13 DIAGNOSIS — E291 Testicular hypofunction: Secondary | ICD-10-CM | POA: Diagnosis not present

## 2017-01-13 DIAGNOSIS — N486 Induration penis plastica: Secondary | ICD-10-CM | POA: Diagnosis not present

## 2017-01-13 DIAGNOSIS — N451 Epididymitis: Secondary | ICD-10-CM | POA: Diagnosis not present

## 2017-01-13 DIAGNOSIS — N5201 Erectile dysfunction due to arterial insufficiency: Secondary | ICD-10-CM | POA: Diagnosis not present

## 2017-01-13 DIAGNOSIS — Z79899 Other long term (current) drug therapy: Secondary | ICD-10-CM | POA: Diagnosis not present

## 2017-02-01 DIAGNOSIS — Z87891 Personal history of nicotine dependence: Secondary | ICD-10-CM | POA: Diagnosis not present

## 2017-02-01 DIAGNOSIS — M7541 Impingement syndrome of right shoulder: Secondary | ICD-10-CM | POA: Diagnosis not present

## 2017-02-01 DIAGNOSIS — M75121 Complete rotator cuff tear or rupture of right shoulder, not specified as traumatic: Secondary | ICD-10-CM | POA: Insufficient documentation

## 2017-02-01 DIAGNOSIS — Z880 Allergy status to penicillin: Secondary | ICD-10-CM | POA: Diagnosis not present

## 2017-02-01 DIAGNOSIS — M12811 Other specific arthropathies, not elsewhere classified, right shoulder: Secondary | ICD-10-CM | POA: Insufficient documentation

## 2017-02-01 DIAGNOSIS — M7551 Bursitis of right shoulder: Secondary | ICD-10-CM | POA: Diagnosis not present

## 2017-02-01 DIAGNOSIS — M65811 Other synovitis and tenosynovitis, right shoulder: Secondary | ICD-10-CM | POA: Diagnosis not present

## 2017-02-01 DIAGNOSIS — M24111 Other articular cartilage disorders, right shoulder: Secondary | ICD-10-CM | POA: Diagnosis not present

## 2017-02-01 DIAGNOSIS — M66311 Spontaneous rupture of flexor tendons, right shoulder: Secondary | ICD-10-CM | POA: Diagnosis not present

## 2017-02-01 DIAGNOSIS — M7521 Bicipital tendinitis, right shoulder: Secondary | ICD-10-CM | POA: Diagnosis not present

## 2017-02-01 DIAGNOSIS — S46011A Strain of muscle(s) and tendon(s) of the rotator cuff of right shoulder, initial encounter: Secondary | ICD-10-CM | POA: Diagnosis not present

## 2017-02-01 DIAGNOSIS — M19011 Primary osteoarthritis, right shoulder: Secondary | ICD-10-CM | POA: Insufficient documentation

## 2017-02-01 DIAGNOSIS — M75101 Unspecified rotator cuff tear or rupture of right shoulder, not specified as traumatic: Secondary | ICD-10-CM | POA: Insufficient documentation

## 2017-02-02 DIAGNOSIS — M19011 Primary osteoarthritis, right shoulder: Secondary | ICD-10-CM | POA: Diagnosis not present

## 2017-02-02 DIAGNOSIS — M7551 Bursitis of right shoulder: Secondary | ICD-10-CM | POA: Diagnosis not present

## 2017-02-02 DIAGNOSIS — S46011A Strain of muscle(s) and tendon(s) of the rotator cuff of right shoulder, initial encounter: Secondary | ICD-10-CM | POA: Diagnosis not present

## 2017-02-02 DIAGNOSIS — M7521 Bicipital tendinitis, right shoulder: Secondary | ICD-10-CM | POA: Diagnosis not present

## 2017-02-02 DIAGNOSIS — M65811 Other synovitis and tenosynovitis, right shoulder: Secondary | ICD-10-CM | POA: Diagnosis not present

## 2017-02-02 DIAGNOSIS — M24111 Other articular cartilage disorders, right shoulder: Secondary | ICD-10-CM | POA: Diagnosis not present

## 2017-02-03 DIAGNOSIS — M25511 Pain in right shoulder: Secondary | ICD-10-CM | POA: Diagnosis not present

## 2017-02-07 DIAGNOSIS — M25511 Pain in right shoulder: Secondary | ICD-10-CM | POA: Diagnosis not present

## 2017-02-10 DIAGNOSIS — M25511 Pain in right shoulder: Secondary | ICD-10-CM | POA: Diagnosis not present

## 2017-02-14 DIAGNOSIS — M25511 Pain in right shoulder: Secondary | ICD-10-CM | POA: Diagnosis not present

## 2017-02-17 DIAGNOSIS — M25511 Pain in right shoulder: Secondary | ICD-10-CM | POA: Diagnosis not present

## 2017-02-21 DIAGNOSIS — M25511 Pain in right shoulder: Secondary | ICD-10-CM | POA: Diagnosis not present

## 2017-02-24 DIAGNOSIS — M25511 Pain in right shoulder: Secondary | ICD-10-CM | POA: Diagnosis not present

## 2017-02-25 ENCOUNTER — Encounter: Payer: Self-pay | Admitting: Internal Medicine

## 2017-02-25 ENCOUNTER — Ambulatory Visit (INDEPENDENT_AMBULATORY_CARE_PROVIDER_SITE_OTHER): Payer: Medicare Other | Admitting: Internal Medicine

## 2017-02-25 VITALS — BP 136/84 | HR 77 | Temp 98.6°F | Resp 12 | Ht 69.0 in | Wt 161.6 lb

## 2017-02-25 DIAGNOSIS — G479 Sleep disorder, unspecified: Secondary | ICD-10-CM

## 2017-02-25 DIAGNOSIS — R972 Elevated prostate specific antigen [PSA]: Secondary | ICD-10-CM | POA: Diagnosis not present

## 2017-02-25 DIAGNOSIS — R739 Hyperglycemia, unspecified: Secondary | ICD-10-CM

## 2017-02-25 DIAGNOSIS — Z9889 Other specified postprocedural states: Secondary | ICD-10-CM

## 2017-02-25 DIAGNOSIS — Z13220 Encounter for screening for lipoid disorders: Secondary | ICD-10-CM | POA: Diagnosis not present

## 2017-02-25 NOTE — Progress Notes (Signed)
Patient ID: Mike Farley, male   DOB: 04-05-1951, 66 y.o.   MRN: [id number]   Subjective:    Patient ID: Mike Farley, male    DOB: 1951/04/13, 66 y.o.   MRN: [id number]  HPI  Patient here for a scheduled follow up.  He is s/p rotator cuff repair 02/01/17.  Extensive surgery.  Has done well.  Still in a brace.  Can only do passive rom now.  States otherwise is doing relatively well.  Seeing urology for elevated psa.  S/p biopsy.  Continues f/u with urology.  No chest pain.  No sob.  No acid reflux.  No abdominal pain.  Bowels moving.     Past Medical History:  Diagnosis Date  . Depression    years ago (minor)   Past Surgical History:  Procedure Laterality Date  . ANTERIOR FUSION CERVICAL SPINE  2011  . APPENDECTOMY    . CERVICAL DISCECTOMY  2008   posterior  . CHOLECYSTECTOMY    . ELBOW FRACTURE SURGERY Left 2004  . TONSILLECTOMY AND ADENOIDECTOMY    . TOTAL KNEE ARTHROPLASTY Right 2005   x 4   Family History  Problem Relation Age of Onset  . Arthritis Mother   . Cancer Mother        colon & breast cnacer  . Mental illness Mother        Depression & bipolar  . Cancer Father        lung & prostate cancer  . Alcohol abuse Brother   . Kidney disease Brother    Social History   Social History  . Marital status: Married    Spouse name: N/A  . Number of children: N/A  . Years of education: N/A   Social History Main Topics  . Smoking status: Former Research scientist (life sciences)  . Smokeless tobacco: Current User  . Alcohol use No  . Drug use: Unknown  . Sexual activity: Not Asked   Other Topics Concern  . None   Social History Narrative  . None    Outpatient Encounter Prescriptions as of 02/25/2017  Medication Sig  . meloxicam (MOBIC) 7.5 MG tablet Take 1 tablet (7.5 mg total) by mouth daily.  . Oxycodone HCl 20 MG TABS   . SEROQUEL XR 300 MG 24 hr tablet TAKE 1 TABLET BY MOUTH AT BEDTIME  . valACYclovir (VALTREX) 500 MG tablet TAKE ONE TABLET TWICE A DAY  . [DISCONTINUED]  HYDROcodone-acetaminophen (NORCO/VICODIN) 5-325 MG tablet Take 1 tablet by mouth every 4 (four) hours as needed for moderate pain.  . TESTIM 50 MG/5GM (1%) GEL   . [DISCONTINUED] diclofenac (VOLTAREN) 0.1 % ophthalmic solution Place 1 drop into the left eye 4 (four) times daily.  . [DISCONTINUED] QUEtiapine (SEROQUEL) 300 MG tablet TAKE 1 TABLET BY MOUTH AT BEDTIME  . [DISCONTINUED] tobramycin (TOBREX) 0.3 % ophthalmic solution Place 2 drops into the left eye every 4 (four) hours.   No facility-administered encounter medications on file as of 02/25/2017.     Review of Systems  Constitutional: Negative for appetite change and unexpected weight change.  HENT: Negative for congestion and sinus pressure.   Respiratory: Negative for cough, chest tightness and shortness of breath.   Cardiovascular: Negative for chest pain, palpitations and leg swelling.  Gastrointestinal: Negative for abdominal pain, diarrhea, nausea and vomiting.  Genitourinary: Negative for difficulty urinating and dysuria.  Musculoskeletal: Negative for joint swelling.       S/p shoulder surgery s/p rotator cuff surgery.   Skin: Negative for  color change and rash.  Neurological: Negative for dizziness, light-headedness and headaches.  Psychiatric/Behavioral: Negative for agitation and dysphoric mood.       Objective:    Physical Exam  Constitutional: He appears well-developed and well-nourished. No distress.  HENT:  Nose: Nose normal.  Mouth/Throat: Oropharynx is clear and moist.  Neck: Neck supple. No thyromegaly present.  Cardiovascular: Normal rate and regular rhythm.   Pulmonary/Chest: Effort normal and breath sounds normal. No respiratory distress.  Abdominal: Soft. Bowel sounds are normal. There is no tenderness.  Musculoskeletal: He exhibits no edema or tenderness.  Arm in sling/brace    Lymphadenopathy:    He has no cervical adenopathy.  Skin: No rash noted. No erythema.  Psychiatric: He has a normal mood  and affect. His behavior is normal.    BP 136/84 (BP Location: Left Arm, Patient Position: Sitting, Cuff Size: Normal)   Pulse 77   Temp 98.6 F (37 C) (Oral)   Resp 12   Ht 5' 9"  (1.753 m)   Wt 161 lb 9.6 oz (73.3 kg)   SpO2 97%   BMI 23.86 kg/m  Wt Readings from Last 3 Encounters:  02/25/17 161 lb 9.6 oz (73.3 kg)  10/22/16 164 lb (74.4 kg)  10/28/15 164 lb 12.8 oz (74.8 kg)     Lab Results  Component Value Date   WBC 6.4 07/18/2015   HGB 13.9 07/18/2015   HCT 41.2 07/18/2015   PLT 245.0 07/18/2015   GLUCOSE 109 (H) 07/18/2015   CHOL 204 (H) 07/18/2015   TRIG 89.0 07/18/2015   HDL 71.60 07/18/2015   LDLCALC 115 (H) 07/18/2015   ALT 19 07/18/2015   AST 21 07/18/2015   NA 140 07/18/2015   K 3.8 07/18/2015   CL 102 07/18/2015   CREATININE 0.92 07/18/2015   BUN 15 07/18/2015   CO2 31 07/18/2015   TSH 5.81 (H) 09/04/2015   PSA 2.19 05/12/2015   INR 1.03 05/29/2009   HGBA1C 6.0 09/04/2015       Assessment & Plan:   Problem List Items Addressed This Visit    Elevated PSA    Followed by urology.  S/p biopsy.        History of shoulder surgery    S/p surgery as outlined.  Continue f/u with ortho.        Hyperglycemia    Low carb diet and exercise.  Follow met b and a1c.        Relevant Orders   CBC with Differential/Platelet   Comprehensive metabolic panel   Hemoglobin A1c   Sleep difficulties    Has been on seroquel for years.  Follow.  Stable.       Relevant Orders   TSH    Other Visit Diagnoses    Screening cholesterol level    -  Primary   Relevant Orders   Lipid panel       Einar Pheasant, MD

## 2017-02-28 ENCOUNTER — Encounter: Payer: Self-pay | Admitting: Internal Medicine

## 2017-02-28 DIAGNOSIS — M25511 Pain in right shoulder: Secondary | ICD-10-CM | POA: Diagnosis not present

## 2017-02-28 DIAGNOSIS — R739 Hyperglycemia, unspecified: Secondary | ICD-10-CM | POA: Insufficient documentation

## 2017-02-28 DIAGNOSIS — R972 Elevated prostate specific antigen [PSA]: Secondary | ICD-10-CM | POA: Insufficient documentation

## 2017-02-28 NOTE — Assessment & Plan Note (Signed)
S/p surgery as outlined.  Continue f/u with ortho.

## 2017-02-28 NOTE — Assessment & Plan Note (Signed)
Followed by urology.  S/p biopsy.

## 2017-02-28 NOTE — Assessment & Plan Note (Signed)
Has been on seroquel for years.  Follow.  Stable.

## 2017-02-28 NOTE — Assessment & Plan Note (Signed)
Low carb diet and exercise.  Follow met b and a1c.   

## 2017-03-01 DIAGNOSIS — Z23 Encounter for immunization: Secondary | ICD-10-CM | POA: Diagnosis not present

## 2017-03-03 DIAGNOSIS — M25511 Pain in right shoulder: Secondary | ICD-10-CM | POA: Diagnosis not present

## 2017-03-10 ENCOUNTER — Other Ambulatory Visit (INDEPENDENT_AMBULATORY_CARE_PROVIDER_SITE_OTHER): Payer: Medicare Other

## 2017-03-10 DIAGNOSIS — Z13220 Encounter for screening for lipoid disorders: Secondary | ICD-10-CM

## 2017-03-10 DIAGNOSIS — R739 Hyperglycemia, unspecified: Secondary | ICD-10-CM

## 2017-03-10 DIAGNOSIS — G479 Sleep disorder, unspecified: Secondary | ICD-10-CM

## 2017-03-10 DIAGNOSIS — M25511 Pain in right shoulder: Secondary | ICD-10-CM | POA: Diagnosis not present

## 2017-03-10 LAB — CBC WITH DIFFERENTIAL/PLATELET
BASOS PCT: 0.3 % (ref 0.0–3.0)
Basophils Absolute: 0 10*3/uL (ref 0.0–0.1)
EOS PCT: 5.2 % — AB (ref 0.0–5.0)
Eosinophils Absolute: 0.3 10*3/uL (ref 0.0–0.7)
HEMATOCRIT: 38.6 % — AB (ref 39.0–52.0)
HEMOGLOBIN: 12.9 g/dL — AB (ref 13.0–17.0)
LYMPHS PCT: 42.1 % (ref 12.0–46.0)
Lymphs Abs: 2.5 10*3/uL (ref 0.7–4.0)
MCHC: 33.5 g/dL (ref 30.0–36.0)
MCV: 91 fl (ref 78.0–100.0)
MONOS PCT: 8.1 % (ref 3.0–12.0)
Monocytes Absolute: 0.5 10*3/uL (ref 0.1–1.0)
Neutro Abs: 2.6 10*3/uL (ref 1.4–7.7)
Neutrophils Relative %: 44.3 % (ref 43.0–77.0)
Platelets: 243 10*3/uL (ref 150.0–400.0)
RBC: 4.25 Mil/uL (ref 4.22–5.81)
RDW: 13 % (ref 11.5–15.5)
WBC: 6 10*3/uL (ref 4.0–10.5)

## 2017-03-10 LAB — LIPID PANEL
Cholesterol: 191 mg/dL (ref 0–200)
HDL: 62.6 mg/dL (ref >39.00)
LDL CALC: 112 mg/dL — AB (ref 0–99)
NONHDL: 128.44
Total CHOL/HDL Ratio: 3
Triglycerides: 83 mg/dL (ref 0.0–149.0)
VLDL: 16.6 mg/dL (ref 0.0–40.0)

## 2017-03-10 LAB — COMPREHENSIVE METABOLIC PANEL
ALBUMIN: 4.4 g/dL (ref 3.5–5.2)
ALT: 25 U/L (ref 0–53)
AST: 26 U/L (ref 0–37)
Alkaline Phosphatase: 91 U/L (ref 39–117)
BILIRUBIN TOTAL: 0.6 mg/dL (ref 0.2–1.2)
BUN: 21 mg/dL (ref 6–23)
CALCIUM: 9.6 mg/dL (ref 8.4–10.5)
CO2: 32 mEq/L (ref 19–32)
CREATININE: 0.92 mg/dL (ref 0.40–1.50)
Chloride: 101 mEq/L (ref 96–112)
GFR: 87.28 mL/min (ref >60.00)
Glucose, Bld: 100 mg/dL — ABNORMAL HIGH (ref 70–99)
Potassium: 4.2 mEq/L (ref 3.5–5.1)
SODIUM: 141 meq/L (ref 135–145)
Total Protein: 6.9 g/dL (ref 6.0–8.3)

## 2017-03-10 LAB — TSH: TSH: 4.56 u[IU]/mL — AB (ref 0.35–4.50)

## 2017-03-10 LAB — HEMOGLOBIN A1C: HEMOGLOBIN A1C: 5.7 % (ref 4.6–6.5)

## 2017-03-14 MED ORDER — ROSUVASTATIN CALCIUM 10 MG PO TABS: 10.0000 mg | ORAL_TABLET | Freq: Every day | ORAL | 1 refills | Status: DC

## 2017-03-14 NOTE — Addendum Note (Signed)
Addended by: Francella Solian on: 03/14/2017 03:59 PM   Modules accepted: Orders

## 2017-03-15 ENCOUNTER — Other Ambulatory Visit: Payer: Self-pay | Admitting: Internal Medicine

## 2017-03-15 DIAGNOSIS — R972 Elevated prostate specific antigen [PSA]: Secondary | ICD-10-CM | POA: Diagnosis not present

## 2017-03-15 DIAGNOSIS — Z1211 Encounter for screening for malignant neoplasm of colon: Secondary | ICD-10-CM

## 2017-03-15 DIAGNOSIS — D649 Anemia, unspecified: Secondary | ICD-10-CM

## 2017-03-15 DIAGNOSIS — N401 Enlarged prostate with lower urinary tract symptoms: Secondary | ICD-10-CM | POA: Diagnosis not present

## 2017-03-15 NOTE — Progress Notes (Signed)
Order placed for GI referral.   

## 2017-03-17 DIAGNOSIS — M25511 Pain in right shoulder: Secondary | ICD-10-CM | POA: Diagnosis not present

## 2017-03-24 DIAGNOSIS — M25511 Pain in right shoulder: Secondary | ICD-10-CM | POA: Diagnosis not present

## 2017-03-29 DIAGNOSIS — M47812 Spondylosis without myelopathy or radiculopathy, cervical region: Secondary | ICD-10-CM | POA: Diagnosis not present

## 2017-03-29 DIAGNOSIS — M25511 Pain in right shoulder: Secondary | ICD-10-CM | POA: Diagnosis not present

## 2017-03-29 DIAGNOSIS — Z6823 Body mass index (BMI) 23.0-23.9, adult: Secondary | ICD-10-CM | POA: Diagnosis not present

## 2017-03-30 DIAGNOSIS — R972 Elevated prostate specific antigen [PSA]: Secondary | ICD-10-CM | POA: Diagnosis not present

## 2017-03-30 DIAGNOSIS — N401 Enlarged prostate with lower urinary tract symptoms: Secondary | ICD-10-CM | POA: Diagnosis not present

## 2017-03-30 DIAGNOSIS — N5201 Erectile dysfunction due to arterial insufficiency: Secondary | ICD-10-CM | POA: Diagnosis not present

## 2017-04-04 ENCOUNTER — Other Ambulatory Visit: Payer: Self-pay | Admitting: Internal Medicine

## 2017-04-04 DIAGNOSIS — M25511 Pain in right shoulder: Secondary | ICD-10-CM | POA: Diagnosis not present

## 2017-04-04 NOTE — Telephone Encounter (Signed)
Last script 03/08/16  #30 4rf O/v 02/25/17 F/u 05/10/17

## 2017-04-05 DIAGNOSIS — M75121 Complete rotator cuff tear or rupture of right shoulder, not specified as traumatic: Secondary | ICD-10-CM | POA: Diagnosis not present

## 2017-04-05 DIAGNOSIS — M7541 Impingement syndrome of right shoulder: Secondary | ICD-10-CM | POA: Diagnosis not present

## 2017-04-05 DIAGNOSIS — M24111 Other articular cartilage disorders, right shoulder: Secondary | ICD-10-CM | POA: Diagnosis not present

## 2017-04-05 DIAGNOSIS — M75101 Unspecified rotator cuff tear or rupture of right shoulder, not specified as traumatic: Secondary | ICD-10-CM | POA: Diagnosis not present

## 2017-04-05 DIAGNOSIS — M67921 Unspecified disorder of synovium and tendon, right upper arm: Secondary | ICD-10-CM | POA: Diagnosis not present

## 2017-04-05 DIAGNOSIS — M12811 Other specific arthropathies, not elsewhere classified, right shoulder: Secondary | ICD-10-CM | POA: Diagnosis not present

## 2017-04-05 DIAGNOSIS — M19011 Primary osteoarthritis, right shoulder: Secondary | ICD-10-CM | POA: Diagnosis not present

## 2017-04-05 NOTE — Telephone Encounter (Signed)
ok'd rx for seroquel #30 with 3 refills.

## 2017-04-08 DIAGNOSIS — Z23 Encounter for immunization: Secondary | ICD-10-CM | POA: Diagnosis not present

## 2017-04-21 DIAGNOSIS — M25511 Pain in right shoulder: Secondary | ICD-10-CM | POA: Diagnosis not present

## 2017-04-25 ENCOUNTER — Other Ambulatory Visit: Payer: Self-pay | Admitting: Internal Medicine

## 2017-04-25 ENCOUNTER — Telehealth: Payer: Self-pay | Admitting: Radiology

## 2017-04-25 DIAGNOSIS — M75121 Complete rotator cuff tear or rupture of right shoulder, not specified as traumatic: Secondary | ICD-10-CM | POA: Diagnosis not present

## 2017-04-25 DIAGNOSIS — E7800 Pure hypercholesterolemia, unspecified: Secondary | ICD-10-CM

## 2017-04-25 DIAGNOSIS — R7989 Other specified abnormal findings of blood chemistry: Secondary | ICD-10-CM

## 2017-04-25 NOTE — Telephone Encounter (Signed)
PT coming in tomorrow for labs, please place future orders. Thank you.

## 2017-04-25 NOTE — Telephone Encounter (Signed)
Order placed for f/u labs.  In non fasting.  Just needs tsh and liver panel checked.

## 2017-04-25 NOTE — Progress Notes (Signed)
Order placed for f/u labs.  

## 2017-04-26 ENCOUNTER — Other Ambulatory Visit (INDEPENDENT_AMBULATORY_CARE_PROVIDER_SITE_OTHER): Payer: Medicare Other

## 2017-04-26 DIAGNOSIS — E7800 Pure hypercholesterolemia, unspecified: Secondary | ICD-10-CM

## 2017-04-26 LAB — HEPATIC FUNCTION PANEL
ALBUMIN: 4.9 g/dL (ref 3.5–5.2)
ALK PHOS: 83 U/L (ref 39–117)
ALT: 15 U/L (ref 0–53)
AST: 20 U/L (ref 0–37)
Bilirubin, Direct: 0.1 mg/dL (ref 0.0–0.3)
TOTAL PROTEIN: 7.3 g/dL (ref 6.0–8.3)
Total Bilirubin: 0.5 mg/dL (ref 0.2–1.2)

## 2017-04-26 LAB — TSH: TSH: 1.07 u[IU]/mL (ref 0.35–4.50)

## 2017-04-27 ENCOUNTER — Encounter: Payer: Self-pay | Admitting: *Deleted

## 2017-05-05 DIAGNOSIS — M25511 Pain in right shoulder: Secondary | ICD-10-CM | POA: Diagnosis not present

## 2017-05-10 ENCOUNTER — Ambulatory Visit: Payer: Medicare Other | Admitting: Internal Medicine

## 2017-05-10 DIAGNOSIS — L03031 Cellulitis of right toe: Secondary | ICD-10-CM | POA: Diagnosis not present

## 2017-05-11 DIAGNOSIS — M25511 Pain in right shoulder: Secondary | ICD-10-CM | POA: Diagnosis not present

## 2017-05-23 DIAGNOSIS — M12811 Other specific arthropathies, not elsewhere classified, right shoulder: Secondary | ICD-10-CM | POA: Diagnosis not present

## 2017-05-23 DIAGNOSIS — M67921 Unspecified disorder of synovium and tendon, right upper arm: Secondary | ICD-10-CM | POA: Diagnosis not present

## 2017-05-23 DIAGNOSIS — M19011 Primary osteoarthritis, right shoulder: Secondary | ICD-10-CM | POA: Diagnosis not present

## 2017-05-23 DIAGNOSIS — M75101 Unspecified rotator cuff tear or rupture of right shoulder, not specified as traumatic: Secondary | ICD-10-CM | POA: Diagnosis not present

## 2017-05-23 DIAGNOSIS — M7541 Impingement syndrome of right shoulder: Secondary | ICD-10-CM | POA: Diagnosis not present

## 2017-05-23 DIAGNOSIS — M75121 Complete rotator cuff tear or rupture of right shoulder, not specified as traumatic: Secondary | ICD-10-CM | POA: Diagnosis not present

## 2017-05-23 DIAGNOSIS — M24111 Other articular cartilage disorders, right shoulder: Secondary | ICD-10-CM | POA: Diagnosis not present

## 2017-05-24 HISTORY — PX: COLONOSCOPY: SHX174

## 2017-05-26 DIAGNOSIS — M21622 Bunionette of left foot: Secondary | ICD-10-CM | POA: Diagnosis not present

## 2017-05-26 DIAGNOSIS — M79672 Pain in left foot: Secondary | ICD-10-CM | POA: Diagnosis not present

## 2017-05-26 DIAGNOSIS — L03031 Cellulitis of right toe: Secondary | ICD-10-CM | POA: Diagnosis not present

## 2017-05-27 ENCOUNTER — Other Ambulatory Visit: Payer: Self-pay | Admitting: Internal Medicine

## 2017-05-30 DIAGNOSIS — Z4789 Encounter for other orthopedic aftercare: Secondary | ICD-10-CM | POA: Diagnosis not present

## 2017-05-31 DIAGNOSIS — D649 Anemia, unspecified: Secondary | ICD-10-CM | POA: Diagnosis not present

## 2017-05-31 DIAGNOSIS — Z1211 Encounter for screening for malignant neoplasm of colon: Secondary | ICD-10-CM | POA: Diagnosis not present

## 2017-06-03 DIAGNOSIS — Z4789 Encounter for other orthopedic aftercare: Secondary | ICD-10-CM | POA: Diagnosis not present

## 2017-06-07 DIAGNOSIS — Z4789 Encounter for other orthopedic aftercare: Secondary | ICD-10-CM | POA: Diagnosis not present

## 2017-06-09 DIAGNOSIS — Z4789 Encounter for other orthopedic aftercare: Secondary | ICD-10-CM | POA: Diagnosis not present

## 2017-06-15 DIAGNOSIS — Z4789 Encounter for other orthopedic aftercare: Secondary | ICD-10-CM | POA: Diagnosis not present

## 2017-06-20 DIAGNOSIS — Z4789 Encounter for other orthopedic aftercare: Secondary | ICD-10-CM | POA: Diagnosis not present

## 2017-06-21 ENCOUNTER — Encounter: Payer: Self-pay | Admitting: Anesthesiology

## 2017-06-21 ENCOUNTER — Other Ambulatory Visit: Payer: Self-pay | Admitting: Podiatry

## 2017-06-21 DIAGNOSIS — M21622 Bunionette of left foot: Secondary | ICD-10-CM | POA: Diagnosis not present

## 2017-06-23 DIAGNOSIS — K640 First degree hemorrhoids: Secondary | ICD-10-CM | POA: Diagnosis not present

## 2017-06-23 DIAGNOSIS — D126 Benign neoplasm of colon, unspecified: Secondary | ICD-10-CM | POA: Diagnosis not present

## 2017-06-23 DIAGNOSIS — K635 Polyp of colon: Secondary | ICD-10-CM | POA: Diagnosis not present

## 2017-06-23 DIAGNOSIS — K5730 Diverticulosis of large intestine without perforation or abscess without bleeding: Secondary | ICD-10-CM | POA: Diagnosis not present

## 2017-06-23 DIAGNOSIS — Z1211 Encounter for screening for malignant neoplasm of colon: Secondary | ICD-10-CM | POA: Diagnosis not present

## 2017-06-23 DIAGNOSIS — K648 Other hemorrhoids: Secondary | ICD-10-CM | POA: Diagnosis not present

## 2017-06-24 ENCOUNTER — Encounter: Payer: Self-pay | Admitting: Internal Medicine

## 2017-06-24 ENCOUNTER — Ambulatory Visit (INDEPENDENT_AMBULATORY_CARE_PROVIDER_SITE_OTHER): Payer: Medicare Other | Admitting: Internal Medicine

## 2017-06-24 VITALS — BP 136/78 | HR 72 | Temp 98.3°F | Resp 18 | Wt 167.6 lb

## 2017-06-24 DIAGNOSIS — Z23 Encounter for immunization: Secondary | ICD-10-CM | POA: Diagnosis not present

## 2017-06-24 DIAGNOSIS — Z9889 Other specified postprocedural states: Secondary | ICD-10-CM | POA: Diagnosis not present

## 2017-06-24 DIAGNOSIS — R739 Hyperglycemia, unspecified: Secondary | ICD-10-CM | POA: Diagnosis not present

## 2017-06-24 NOTE — Progress Notes (Signed)
Patient ID: Mike Farley, male   DOB: December 31, 1950, 67 y.o.   MRN: [id number]   Subjective:    Patient ID: Mike Farley, male    DOB: [birth year], 67 y.o.   MRN: [id number]  HPI  Patient here for a scheduled follow up.  Having increased pain in his shoulder.  Seeing Dr Jene Every at [facility].  Has surgery 02/01/17.  Reinjured.  Now with increased pain.  Affecting his activity.  Limited rom.  Taking oxycodone.  Has MRI scheduled for next week.  No chest pain.  No sob.  No acid reflux.  No abdominal pain.  Bowels moving.     Past Medical History:  Diagnosis Date  . Cervical pain (neck)    limited mobility  . Chronic pain   . Depression    years ago (minor)   Past Surgical History:  Procedure Laterality Date  . ANTERIOR FUSION CERVICAL SPINE  2011  . APPENDECTOMY    . CERVICAL DISCECTOMY  2008   posterior  . CHOLECYSTECTOMY    . ELBOW FRACTURE SURGERY Left 2004  . TONSILLECTOMY AND ADENOIDECTOMY    . TOTAL KNEE ARTHROPLASTY Right 2005   x 4   Family History  Problem Relation Age of Onset  . Arthritis Mother   . Cancer Mother        colon & breast cnacer  . Mental illness Mother        Depression & bipolar  . Cancer Father        lung & prostate cancer  . Alcohol abuse Brother   . Kidney disease Brother    Social History   Socioeconomic History  . Marital status: Married    Spouse name: None  . Number of children: None  . Years of education: None  . Highest education level: None  Social Needs  . Financial resource strain: None  . Food insecurity - worry: None  . Food insecurity - inability: None  . Transportation needs - medical: None  . Transportation needs - non-medical: None  Occupational History  . None  Tobacco Use  . Smoking status: Former Research scientist (life sciences)  . Smokeless tobacco: Current User  Substance and Sexual Activity  . Alcohol use: Yes    Alcohol/week: 0.0 oz    Comment: socially  . Drug use: None  . Sexual activity: None  Other Topics Concern  . None    Social History Narrative  . None    Outpatient Encounter Medications as of 06/24/2017  Medication Sig  . Multiple Vitamin (MULTIVITAMIN) tablet Take 1 tablet by mouth daily.  . Oxycodone HCl 20 MG TABS   . rosuvastatin (CRESTOR) 10 MG tablet TAKE 1 TABLET BY MOUTH DAILY  . SEROQUEL XR 300 MG 24 hr tablet TAKE 1 TABLET BY MOUTH AT BEDTIME  . TESTIM 50 MG/5GM (1%) GEL   . valACYclovir (VALTREX) 500 MG tablet TAKE ONE TABLET TWICE A DAY  . meloxicam (MOBIC) 7.5 MG tablet Take 1 tablet (7.5 mg total) by mouth daily. (Patient not taking: Reported on 06/24/2017)  . [DISCONTINUED] QUEtiapine (SEROQUEL) 300 MG tablet TAKE 1 TABLET BY MOUTH AT BEDTIME (Patient not taking: Reported on 06/21/2017)   No facility-administered encounter medications on file as of 06/24/2017.     Review of Systems  Constitutional: Negative for appetite change and unexpected weight change.  HENT: Negative for congestion and sinus pressure.   Respiratory: Negative for cough, chest tightness and shortness of breath.   Cardiovascular: Negative for chest pain, palpitations and leg  swelling.  Gastrointestinal: Negative for abdominal pain, diarrhea, nausea and vomiting.  Genitourinary: Negative for difficulty urinating and dysuria.  Musculoskeletal: Negative for myalgias.       Shoulder pain as outlined.    Skin: Negative for color change and rash.  Neurological: Negative for dizziness, light-headedness and headaches.  Psychiatric/Behavioral: Negative for agitation and dysphoric mood.       Objective:    Physical Exam  Constitutional: He appears well-developed and well-nourished. No distress.  HENT:  Nose: Nose normal.  Mouth/Throat: Oropharynx is clear and moist.  Neck: Neck supple. No thyromegaly present.  Cardiovascular: Normal rate and regular rhythm.  Pulmonary/Chest: Effort normal and breath sounds normal. No respiratory distress.  Abdominal: Soft. Bowel sounds are normal. There is no tenderness.   Musculoskeletal: He exhibits no edema or tenderness.  Limited rom shoulder with increased pain with abduction.    Lymphadenopathy:    He has no cervical adenopathy.  Skin: No rash noted. No erythema.  Psychiatric: He has a normal mood and affect. His behavior is normal.    BP 136/78 (BP Location: Left Arm, Patient Position: Sitting, Cuff Size: Normal)   Pulse 72   Temp 98.3 F (36.8 C) (Oral)   Resp 18   Wt 167 lb 9.6 oz (76 kg)   SpO2 98%   BMI 24.05 kg/m  Wt Readings from Last 3 Encounters:  06/24/17 167 lb 9.6 oz (76 kg)  02/25/17 161 lb 9.6 oz (73.3 kg)  10/22/16 164 lb (74.4 kg)     Lab Results  Component Value Date   WBC 6.0 03/10/2017   HGB 12.9 (L) 03/10/2017   HCT 38.6 (L) 03/10/2017   PLT 243.0 03/10/2017   GLUCOSE 100 (H) 03/10/2017   CHOL 191 03/10/2017   TRIG 83.0 03/10/2017   HDL 62.60 03/10/2017   LDLCALC 112 (H) 03/10/2017   ALT 15 04/26/2017   AST 20 04/26/2017   NA 141 03/10/2017   K 4.2 03/10/2017   CL 101 03/10/2017   CREATININE 0.92 03/10/2017   BUN 21 03/10/2017   CO2 32 03/10/2017   TSH 1.07 04/26/2017   PSA 2.19 05/12/2015   INR 1.03 05/29/2009   HGBA1C 5.7 03/10/2017       Assessment & Plan:   Problem List Items Addressed This Visit    History of shoulder surgery    S/p shoulder surgery.  Reinjured.  Planning for MRI next week.  States may need another surgery.       Hyperglycemia    Low carb diet and exercise.  Follow met b and a1c.         Other Visit Diagnoses    Need for 23-polyvalent pneumococcal polysaccharide vaccine    -  Primary   Relevant Orders   Pneumococcal polysaccharide vaccine 23-valent greater than or equal to 2yo subcutaneous/IM (Completed)       Einar Pheasant, MD

## 2017-06-26 ENCOUNTER — Encounter: Payer: Self-pay | Admitting: Internal Medicine

## 2017-06-26 NOTE — Assessment & Plan Note (Signed)
S/p shoulder surgery.  Reinjured.  Planning for MRI next week.  States may need another surgery.

## 2017-06-26 NOTE — Assessment & Plan Note (Signed)
Low carb diet and exercise.  Follow met b and a1c.

## 2017-06-28 ENCOUNTER — Other Ambulatory Visit: Payer: Self-pay | Admitting: Internal Medicine

## 2017-06-28 DIAGNOSIS — Z6824 Body mass index (BMI) 24.0-24.9, adult: Secondary | ICD-10-CM | POA: Diagnosis not present

## 2017-06-28 DIAGNOSIS — M47812 Spondylosis without myelopathy or radiculopathy, cervical region: Secondary | ICD-10-CM | POA: Diagnosis not present

## 2017-06-28 NOTE — Discharge Instructions (Signed)
[facility] °[facility] ° °POST OPERATIVE INSTRUCTIONS FOR DR. TROXLER AND DR. FOWLER °[facility] ° ° °1. Take your medication as prescribed.  Pain medication should be taken only as needed. ° °2. Keep the dressing clean, dry and intact. ° °3. Keep your foot elevated above the heart level for the first 48 hours. ° °4. Walking to the bathroom and brief periods of walking are acceptable, unless we have instructed you to be non-weight bearing. ° °5. Always wear your post-op shoe when walking.  Always use your crutches if you are to be non-weight bearing. ° °6. Do not take a shower. Baths are permissible as long as the foot is kept out of the water.  ° °7. Every hour you are awake:  °- Bend your knee 15 times. °- Flex foot 15 times °- Massage calf 15 times ° °8. Call [facility] ([phone]) if any of the following problems occur: °- You develop a temperature or fever. °- The bandage becomes saturated with blood. °- Medication does not stop your pain. °- Injury of the foot occurs. °- Any symptoms of infection including redness, odor, or red streaks running from wound. ° ° °General Anesthesia, Adult, Care After °These instructions provide you with information about caring for yourself after your procedure. Your health care provider may also give you more specific instructions. Your treatment has been planned according to current medical practices, but problems sometimes occur. Call your health care provider if you have any problems or questions after your procedure. °What can I expect after the procedure? °After the procedure, it is common to have: °· Vomiting. °· A sore throat. °· Mental slowness. ° °It is common to feel: °· Nauseous. °· Cold or shivery. °· Sleepy. °· Tired. °· Sore or achy, even in parts of your body where you did not have surgery. ° °Follow these instructions at home: °For at least 24 hours after the procedure: °· Do not: °? Participate in  activities where you could fall or become injured. °? Drive. °? Use heavy machinery. °? Drink alcohol. °? Take sleeping pills or medicines that cause drowsiness. °? Make important decisions or sign legal documents. °? Take care of children on your own. °· Rest. °Eating and drinking °· If you vomit, drink water, juice, or soup when you can drink without vomiting. °· Drink enough fluid to keep your urine clear or pale yellow. °· Make sure you have little or no nausea before eating solid foods. °· Follow the diet recommended by your health care provider. °General instructions °· Have a responsible adult stay with you until you are awake and alert. °· Return to your normal activities as told by your health care provider. Ask your health care provider what activities are safe for you. °· Take over-the-counter and prescription medicines only as told by your health care provider. °· If you smoke, do not smoke without supervision. °· Keep all follow-up visits as told by your health care provider. This is important. °Contact a health care provider if: °· You continue to have nausea or vomiting at home, and medicines are not helpful. °· You cannot drink fluids or start eating again. °· You cannot urinate after 8-12 hours. °· You develop a skin rash. °· You have fever. °· You have increasing redness at the site of your procedure. °Get help right away if: °· You have difficulty breathing. °· You have chest pain. °· You have unexpected bleeding. °· You feel that you   are having a life-threatening or urgent problem. °This information is not intended to replace advice given to you by your health care provider. Make sure you discuss any questions you have with your health care provider. °Document Released: 08/16/2000 Document Revised: 10/13/2015 Document Reviewed: 04/24/2015 °Elsevier Interactive Patient Education © 2018 [employer] ° °

## 2017-06-29 ENCOUNTER — Ambulatory Visit: Admission: RE | Admit: 2017-06-29 | Payer: Medicare Other | Source: Ambulatory Visit | Admitting: Podiatry

## 2017-06-29 HISTORY — DX: Other chronic pain: G89.29

## 2017-06-29 HISTORY — DX: Cervicalgia: M54.2

## 2017-06-29 SURGERY — EXCISION, EXOSTOSIS, TOE
Anesthesia: Choice | Laterality: Left

## 2017-07-01 DIAGNOSIS — J09X2 Influenza due to identified novel influenza A virus with other respiratory manifestations: Secondary | ICD-10-CM | POA: Diagnosis not present

## 2017-07-06 DIAGNOSIS — M24111 Other articular cartilage disorders, right shoulder: Secondary | ICD-10-CM | POA: Diagnosis not present

## 2017-07-06 DIAGNOSIS — M7581 Other shoulder lesions, right shoulder: Secondary | ICD-10-CM | POA: Diagnosis not present

## 2017-07-06 DIAGNOSIS — M19011 Primary osteoarthritis, right shoulder: Secondary | ICD-10-CM | POA: Diagnosis not present

## 2017-07-06 DIAGNOSIS — Z9889 Other specified postprocedural states: Secondary | ICD-10-CM | POA: Diagnosis not present

## 2017-07-10 DIAGNOSIS — J181 Lobar pneumonia, unspecified organism: Secondary | ICD-10-CM | POA: Diagnosis not present

## 2017-07-11 DIAGNOSIS — M75101 Unspecified rotator cuff tear or rupture of right shoulder, not specified as traumatic: Secondary | ICD-10-CM | POA: Diagnosis not present

## 2017-07-11 DIAGNOSIS — M7541 Impingement syndrome of right shoulder: Secondary | ICD-10-CM | POA: Diagnosis not present

## 2017-07-11 DIAGNOSIS — M75121 Complete rotator cuff tear or rupture of right shoulder, not specified as traumatic: Secondary | ICD-10-CM | POA: Diagnosis not present

## 2017-07-11 DIAGNOSIS — M24111 Other articular cartilage disorders, right shoulder: Secondary | ICD-10-CM | POA: Diagnosis not present

## 2017-07-11 DIAGNOSIS — M12811 Other specific arthropathies, not elsewhere classified, right shoulder: Secondary | ICD-10-CM | POA: Diagnosis not present

## 2017-07-12 ENCOUNTER — Ambulatory Visit
Admission: RE | Admit: 2017-07-12 | Discharge: 2017-07-12 | Disposition: A | Discharge status: Home / Self Care | Payer: Medicare Other | Source: Ambulatory Visit | Attending: Family Medicine | Admitting: Family Medicine

## 2017-07-12 ENCOUNTER — Other Ambulatory Visit: Payer: Self-pay | Admitting: Family Medicine

## 2017-07-12 DIAGNOSIS — J440 Chronic obstructive pulmonary disease with acute lower respiratory infection: Secondary | ICD-10-CM | POA: Diagnosis not present

## 2017-07-12 DIAGNOSIS — R05 Cough: Secondary | ICD-10-CM | POA: Diagnosis not present

## 2017-07-12 DIAGNOSIS — J209 Acute bronchitis, unspecified: Secondary | ICD-10-CM | POA: Insufficient documentation

## 2017-07-13 ENCOUNTER — Ambulatory Visit (INDEPENDENT_AMBULATORY_CARE_PROVIDER_SITE_OTHER): Payer: Medicare Other | Admitting: Internal Medicine

## 2017-07-13 ENCOUNTER — Encounter: Payer: Self-pay | Admitting: Internal Medicine

## 2017-07-13 DIAGNOSIS — J069 Acute upper respiratory infection, unspecified: Secondary | ICD-10-CM | POA: Diagnosis not present

## 2017-07-13 DIAGNOSIS — Z9889 Other specified postprocedural states: Secondary | ICD-10-CM

## 2017-07-13 MED ORDER — PREDNISONE 10 MG PO TABS: ORAL_TABLET | ORAL | 0 refills | Status: DC

## 2017-07-13 NOTE — Patient Instructions (Signed)
nasacort nasal spray - 2 sprays each nostril one time per day.    Can use saline nasal spray - flush nose at least 2x/day  Take the prednisone as directed.    Complete the doxycycline.

## 2017-07-13 NOTE — Progress Notes (Signed)
Patient ID: Mike Farley, male   DOB: 12/18/1950, 67 y.o.   MRN: [id number]   Subjective:    Patient ID: Mike Farley, male    DOB: 08/29/1950, 67 y.o.   MRN: [id number]  HPI  Patient here as a work in with concerns regarding persistent cough and congestion.  Symptoms started 3 weeks ago.  Diagnosed with flu.  Flu swab positive.  Has developed persistent increased cough.  Some chest tightness and burning - with the cough.  Cough persistent.  Fatigue from the increased cough.  No vomiting.  No acid reflux.  No diarrhea.  Some sinus congestion and pressure and colored mucus production.  No sore throat now.  Was reevaluated on 07/10/17 secondary to persistent symptoms.  Was placed on doxycycline.  Also having persistent left shoulder pain.   Just evaluated by Dr Marlou Starks.  Planning for PT.     Past Medical History:  Diagnosis Date  . Cervical pain (neck)    limited mobility  . Chronic pain   . Depression    years ago (minor)   Past Surgical History:  Procedure Laterality Date  . ANTERIOR FUSION CERVICAL SPINE  2011  . APPENDECTOMY    . CERVICAL DISCECTOMY  2008   posterior  . CHOLECYSTECTOMY    . ELBOW FRACTURE SURGERY Left 2004  . TONSILLECTOMY AND ADENOIDECTOMY    . TOTAL KNEE ARTHROPLASTY Right 2005   x 4   Family History  Problem Relation Age of Onset  . Arthritis Mother   . Cancer Mother        colon & breast cnacer  . Mental illness Mother        Depression & bipolar  . Cancer Father        lung & prostate cancer  . Alcohol abuse Brother   . Kidney disease Brother    Social History   Socioeconomic History  . Marital status: Married    Spouse name: None  . Number of children: None  . Years of education: None  . Highest education level: None  Social Needs  . Financial resource strain: None  . Food insecurity - worry: None  . Food insecurity - inability: None  . Transportation needs - medical: None  . Transportation needs - non-medical: None  Occupational  History  . None  Tobacco Use  . Smoking status: Former Research scientist (life sciences)  . Smokeless tobacco: Current User  Substance and Sexual Activity  . Alcohol use: Yes    Alcohol/week: 0.0 oz    Comment: socially  . Drug use: None  . Sexual activity: None  Other Topics Concern  . None  Social History Narrative  . None    Outpatient Encounter Medications as of 07/13/2017  Medication Sig  . meloxicam (MOBIC) 7.5 MG tablet Take 1 tablet (7.5 mg total) by mouth daily. (Patient not taking: Reported on 06/24/2017)  . Multiple Vitamin (MULTIVITAMIN) tablet Take 1 tablet by mouth daily.  . Oxycodone HCl 20 MG TABS   . predniSONE (DELTASONE) 10 MG tablet Take 6 tablets x 1 day and then decrease by 1/2 tablet per day until down to zero mg.  Marland Kitchen QUEtiapine (SEROQUEL) 300 MG tablet TAKE 1 TABLET BY MOUTH AT BEDTIME  . rosuvastatin (CRESTOR) 10 MG tablet TAKE 1 TABLET BY MOUTH DAILY  . SEROQUEL XR 300 MG 24 hr tablet TAKE 1 TABLET BY MOUTH AT BEDTIME  . TESTIM 50 MG/5GM (1%) GEL   . valACYclovir (VALTREX) 500 MG tablet TAKE ONE TABLET  TWICE A DAY   No facility-administered encounter medications on file as of 07/13/2017.     Review of Systems  Constitutional: Positive for fatigue. Negative for fever.  HENT: Positive for congestion, postnasal drip and sinus pressure. Negative for sore throat.   Respiratory: Positive for cough. Negative for shortness of breath.   Cardiovascular: Negative for chest pain, palpitations and leg swelling.  Gastrointestinal: Negative for diarrhea, nausea and vomiting.  Genitourinary: Negative for difficulty urinating and dysuria.  Musculoskeletal: Negative for joint swelling and myalgias.  Skin: Negative for color change and rash.  Neurological: Negative for dizziness, light-headedness and headaches.  Psychiatric/Behavioral: Negative for agitation and dysphoric mood.       Objective:    Physical Exam  Constitutional: He appears well-developed and well-nourished. No distress.    HENT:  Minimal tenderness to palpation over the sinuses.  Nares - slightly erythematous turbinates.    Eyes: Conjunctivae are normal. Right eye exhibits no discharge. Left eye exhibits no discharge.  Neck: Neck supple.  Cardiovascular: Normal rate and regular rhythm.  Pulmonary/Chest: Effort normal and breath sounds normal. No respiratory distress.  Some increased cough with forced expiration.    Musculoskeletal: He exhibits no edema.  Lymphadenopathy:    He has no cervical adenopathy.  Skin: No rash noted. No erythema.  Psychiatric: He has a normal mood and affect. His behavior is normal.    BP 120/70 (BP Location: Left Arm, Patient Position: Sitting, Cuff Size: Normal)   Pulse 75   Temp 98.7 F (37.1 C) (Oral)   Resp 20   Wt 166 lb 6.4 oz (75.5 kg)   SpO2 96%   BMI 23.88 kg/m  Wt Readings from Last 3 Encounters:  07/13/17 166 lb 6.4 oz (75.5 kg)  06/24/17 167 lb 9.6 oz (76 kg)  02/25/17 161 lb 9.6 oz (73.3 kg)     Lab Results  Component Value Date   WBC 6.0 03/10/2017   HGB 12.9 (L) 03/10/2017   HCT 38.6 (L) 03/10/2017   PLT 243.0 03/10/2017   GLUCOSE 100 (H) 03/10/2017   CHOL 191 03/10/2017   TRIG 83.0 03/10/2017   HDL 62.60 03/10/2017   LDLCALC 112 (H) 03/10/2017   ALT 15 04/26/2017   AST 20 04/26/2017   NA 141 03/10/2017   K 4.2 03/10/2017   CL 101 03/10/2017   CREATININE 0.92 03/10/2017   BUN 21 03/10/2017   CO2 32 03/10/2017   TSH 1.07 04/26/2017   PSA 2.19 05/12/2015   INR 1.03 05/29/2009   HGBA1C 5.7 03/10/2017    Dg Chest 2 View  Result Date: 07/12/2017 CLINICAL DATA:  Cough. EXAM: CHEST  2 VIEW COMPARISON:  Radiographs of June 30, 2015. FINDINGS: The heart size and mediastinal contours are within normal limits. Both lungs are clear. No pneumothorax or pleural effusion is noted. The visualized skeletal structures are unremarkable. IMPRESSION: No active cardiopulmonary disease. Electronically Signed   By: Marijo Conception, M.D.   On: 07/12/2017  15:46       Assessment & Plan:   Problem List Items Addressed This Visit    History of shoulder surgery    S/p shoulder surgery.  Now with increased pain.  Saw Dr Marlou Starks.  Planning for physical therapy.        URI (upper respiratory infection)    Recently had flu.  Now with persistent cough and congestion as outlined.  On doxycycline.  Will complete secondary to concern regarding possible bacterial infection - after flu.  Will also  treat with prednisone taper as directed.  nasacort nasal spray and saline as directed.  Mucinex.  Follow.  Just had cxr - no pneumonia.  Call with update.            Einar Pheasant, MD

## 2017-07-14 ENCOUNTER — Encounter: Payer: Self-pay | Admitting: Internal Medicine

## 2017-07-16 ENCOUNTER — Other Ambulatory Visit: Payer: Self-pay | Admitting: Internal Medicine

## 2017-07-16 ENCOUNTER — Encounter: Payer: Self-pay | Admitting: Internal Medicine

## 2017-07-16 DIAGNOSIS — J069 Acute upper respiratory infection, unspecified: Secondary | ICD-10-CM | POA: Insufficient documentation

## 2017-07-16 MED ORDER — DOXYCYCLINE HYCLATE 100 MG PO TABS
100.0000 mg | ORAL_TABLET | Freq: Two times a day (BID) | ORAL | 0 refills | Status: DC
Start: 2017-07-16 — End: 2017-09-22

## 2017-07-16 NOTE — Progress Notes (Signed)
rx sent in for doxycycline #14 with no refills.

## 2017-07-16 NOTE — Assessment & Plan Note (Signed)
S/p shoulder surgery.  Now with increased pain.  Saw Dr Marlou Starks.  Planning for physical therapy.

## 2017-07-16 NOTE — Assessment & Plan Note (Signed)
Recently had flu.  Now with persistent cough and congestion as outlined.  On doxycycline.  Will complete secondary to concern regarding possible bacterial infection - after flu.  Will also treat with prednisone taper as directed.  nasacort nasal spray and saline as directed.  Mucinex.  Follow.  Just had cxr - no pneumonia.  Call with update.

## 2017-08-01 ENCOUNTER — Encounter: Payer: Self-pay | Admitting: Internal Medicine

## 2017-08-02 NOTE — Telephone Encounter (Signed)
Left message for patient to call office on home phone called to see how patient is feeling today and to advise symptoms persist he needs to be revaluated.

## 2017-08-03 DIAGNOSIS — M19011 Primary osteoarthritis, right shoulder: Secondary | ICD-10-CM | POA: Diagnosis not present

## 2017-08-03 DIAGNOSIS — Z9889 Other specified postprocedural states: Secondary | ICD-10-CM | POA: Diagnosis not present

## 2017-08-03 DIAGNOSIS — M7541 Impingement syndrome of right shoulder: Secondary | ICD-10-CM | POA: Diagnosis not present

## 2017-09-15 ENCOUNTER — Other Ambulatory Visit: Payer: Self-pay | Admitting: Internal Medicine

## 2017-09-20 ENCOUNTER — Telehealth: Payer: Self-pay | Admitting: Internal Medicine

## 2017-09-20 ENCOUNTER — Encounter: Payer: Self-pay | Admitting: Internal Medicine

## 2017-09-20 DIAGNOSIS — M21622 Bunionette of left foot: Secondary | ICD-10-CM | POA: Diagnosis not present

## 2017-09-20 MED ORDER — VALACYCLOVIR HCL 500 MG PO TABS: 500.0000 mg | ORAL_TABLET | Freq: Two times a day (BID) | ORAL | 0 refills | Status: DC

## 2017-09-20 NOTE — Telephone Encounter (Signed)
Ok to fill? Last filled 08/2016

## 2017-09-20 NOTE — Telephone Encounter (Signed)
Pt needs a refill on valACYclovir (VALTREX) 500 MG tablet sent to  [facility]  He feels like he is about to have a break out

## 2017-09-20 NOTE — Telephone Encounter (Signed)
I sent in a prescription for valtrex to [facility].  Pt notified.

## 2017-09-20 NOTE — Telephone Encounter (Signed)
Need more info.  What symptoms?  How often uses? etc

## 2017-09-20 NOTE — Telephone Encounter (Signed)
See my chart encounter.

## 2017-09-21 ENCOUNTER — Other Ambulatory Visit: Payer: Self-pay | Admitting: Podiatry

## 2017-09-21 NOTE — Anesthesia Preprocedure Evaluation (Addendum)
Anesthesia Evaluation  Patient identified by MRN, date of birth, ID band Patient awake    Reviewed: Allergy & Precautions, H&P , NPO status , Patient's Chart, lab work & pertinent test results  Airway Mallampati: I  TM Distance: >3 FB Neck ROM: Limited   Comment: Neck range of motion is limited, although still ok.  He has a thin neck with a good mallampati- I think will be fine from an airway standpoint.  He otherwise has minimal medical issues. Dental no notable dental hx.    Pulmonary former smoker,   Recent URI but symptoms improving and he feels well. Pulmonary exam normal        Cardiovascular negative cardio ROS Normal cardiovascular exam     Neuro/Psych negative neurological ROS     GI/Hepatic negative GI ROS, Neg liver ROS,   Endo/Other    Renal/GU negative Renal ROS  negative genitourinary   Musculoskeletal   Abdominal   Peds  Hematology negative hematology ROS (+)   Anesthesia Other Findings   Reproductive/Obstetrics negative OB ROS                            Anesthesia Physical Anesthesia Plan  ASA: II  Anesthesia Plan: General LMA   Post-op Pain Management:    Induction:   PONV Risk Score and Plan:   Airway Management Planned:   Additional Equipment:   Intra-op Plan:   Post-operative Plan:   Informed Consent: I have reviewed the patients History and Physical, chart, labs and discussed the procedure including the risks, benefits and alternatives for the proposed anesthesia with the patient or authorized representative who has indicated his/her understanding and acceptance.     Plan Discussed with:   Anesthesia Plan Comments:         Anesthesia Quick Evaluation

## 2017-09-22 ENCOUNTER — Ambulatory Visit (INDEPENDENT_AMBULATORY_CARE_PROVIDER_SITE_OTHER): Payer: Medicare Other | Admitting: Internal Medicine

## 2017-09-22 VITALS — BP 122/78 | HR 80 | Temp 98.2°F | Resp 18 | Wt 164.8 lb

## 2017-09-22 DIAGNOSIS — Z13220 Encounter for screening for lipoid disorders: Secondary | ICD-10-CM | POA: Diagnosis not present

## 2017-09-22 DIAGNOSIS — R7989 Other specified abnormal findings of blood chemistry: Secondary | ICD-10-CM | POA: Diagnosis not present

## 2017-09-22 DIAGNOSIS — R739 Hyperglycemia, unspecified: Secondary | ICD-10-CM | POA: Diagnosis not present

## 2017-09-22 DIAGNOSIS — D649 Anemia, unspecified: Secondary | ICD-10-CM | POA: Diagnosis not present

## 2017-09-22 DIAGNOSIS — R972 Elevated prostate specific antigen [PSA]: Secondary | ICD-10-CM

## 2017-09-22 DIAGNOSIS — G479 Sleep disorder, unspecified: Secondary | ICD-10-CM

## 2017-09-22 DIAGNOSIS — J069 Acute upper respiratory infection, unspecified: Secondary | ICD-10-CM

## 2017-09-22 MED ORDER — DOXYCYCLINE HYCLATE 100 MG PO TABS: 100.0000 mg | ORAL_TABLET | Freq: Two times a day (BID) | ORAL | 0 refills | Status: DC

## 2017-09-22 NOTE — Patient Instructions (Signed)
Saline nasal spray - flush nose at least 2-3x/day  nasacort nasal spray - 2 sprays each nostril one time per day.  Do this in the evening.     Take a probiotic daily while you are on the antibiotics and for two weeks after completing antibiotics.

## 2017-09-22 NOTE — Progress Notes (Signed)
Patient ID: Mike Farley, male   DOB: 10-15-1950, 67 y.o.   MRN: [id number]   Subjective:    Patient ID: Mike Farley, male    DOB: 03/14/1951, 67 y.o.   MRN: [id number]  HPI  Patient here for a scheduled follow up. Reports that over the last 7-10 days has noticed increased sinus pressure and drainage.  Also chest congestion.  Some cough.  No documented fever.  On claritin.  Using afrin.  Some colored mucus.  Symptoms progressing.  Eating.  No nausea or vomiting.  No chest pain or sob.  Bowels moving.  Still with shoulder pain and limitations.  Stable.  Wants to decreased seroquel.  Would like to try something else for sleep.  States was put on seroquel years ago.     Past Medical History:  Diagnosis Date  . Cervical pain (neck)    limited mobility  . Chronic pain   . Depression    years ago (minor)   Past Surgical History:  Procedure Laterality Date  . ANTERIOR FUSION CERVICAL SPINE  2011   4-5 levels  . APPENDECTOMY    . CERVICAL DISCECTOMY  2008   posterior  . CHOLECYSTECTOMY    . ELBOW FRACTURE SURGERY Left 2004  . SHOULDER SURGERY Right   . TONSILLECTOMY AND ADENOIDECTOMY    . TOTAL KNEE ARTHROPLASTY Right 2005   x 4   Family History  Problem Relation Age of Onset  . Arthritis Mother   . Cancer Mother        colon & breast cnacer  . Mental illness Mother        Depression & bipolar  . Cancer Father        lung & prostate cancer  . Alcohol abuse Brother   . Kidney disease Brother    Social History   Socioeconomic History  . Marital status: Married    Spouse name: Not on file  . Number of children: Not on file  . Years of education: Not on file  . Highest education level: Not on file  Occupational History  . Not on file  Social Needs  . Financial resource strain: Not on file  . Food insecurity:    Worry: Not on file    Inability: Not on file  . Transportation needs:    Medical: Not on file    Non-medical: Not on file  Tobacco Use  . Smoking  status: Former Research scientist (life sciences)  . Smokeless tobacco: Current User  Substance and Sexual Activity  . Alcohol use: Yes    Alcohol/week: 0.0 oz    Comment: socially  . Drug use: Not on file  . Sexual activity: Not on file  Lifestyle  . Physical activity:    Days per week: Not on file    Minutes per session: Not on file  . Stress: Not on file  Relationships  . Social connections:    Talks on phone: Not on file    Gets together: Not on file    Attends religious service: Not on file    Active member of club or organization: Not on file    Attends meetings of clubs or organizations: Not on file    Relationship status: Not on file  Other Topics Concern  . Not on file  Social History Narrative  . Not on file    Outpatient Encounter Medications as of 09/22/2017  Medication Sig  . doxycycline (VIBRA-TABS) 100 MG tablet Take 1 tablet (100 mg total) by  mouth 2 (two) times daily.  Marland Kitchen ibuprofen (ADVIL,MOTRIN) 200 MG tablet Take 600 mg by mouth every 6 (six) hours as needed.  . Multiple Vitamin (MULTIVITAMIN) tablet Take 1 tablet by mouth daily.  . naproxen sodium (ALEVE) 220 MG tablet Take 220 mg by mouth.  . Oxycodone HCl 20 MG TABS   . QUEtiapine (SEROQUEL) 300 MG tablet TAKE ONE TABLET AT BEDTIME  . rosuvastatin (CRESTOR) 10 MG tablet TAKE 1 TABLET BY MOUTH DAILY  . valACYclovir (VALTREX) 500 MG tablet Take 1 tablet (500 mg total) by mouth 2 (two) times daily.  . [DISCONTINUED] doxycycline (VIBRA-TABS) 100 MG tablet Take 1 tablet (100 mg total) by mouth 2 (two) times daily. (Patient not taking: Reported on 09/20/2017)  . [DISCONTINUED] meloxicam (MOBIC) 7.5 MG tablet Take 1 tablet (7.5 mg total) by mouth daily. (Patient not taking: Reported on 06/24/2017)  . [DISCONTINUED] predniSONE (DELTASONE) 10 MG tablet Take 6 tablets x 1 day and then decrease by 1/2 tablet per day until down to zero mg. (Patient not taking: Reported on 09/20/2017)  . [DISCONTINUED] TESTIM 50 MG/5GM (1%) GEL    No  facility-administered encounter medications on file as of 09/22/2017.     Review of Systems  Constitutional: Negative for appetite change, fever and unexpected weight change.  HENT: Positive for congestion, postnasal drip and sinus pressure.   Respiratory: Positive for cough. Negative for chest tightness and shortness of breath.        Chest congestion.   Cardiovascular: Negative for chest pain, palpitations and leg swelling.  Gastrointestinal: Negative for abdominal pain, diarrhea, nausea and vomiting.  Genitourinary: Negative for difficulty urinating and dysuria.  Musculoskeletal: Negative for joint swelling and myalgias.  Skin: Negative for color change and rash.  Neurological: Negative for dizziness, light-headedness and headaches.  Psychiatric/Behavioral: Negative for agitation and dysphoric mood.       Objective:    Physical Exam  Constitutional: He appears well-developed and well-nourished. No distress.  HENT:  Mouth/Throat: Oropharynx is clear and moist.  Nares- slightly erythematous turbinates.  Minimal tenderness to palpation over the sinuses.    Neck: Neck supple.  Cardiovascular: Normal rate and regular rhythm.  Pulmonary/Chest: Effort normal and breath sounds normal. No respiratory distress.  Abdominal: Soft. Bowel sounds are normal. There is no tenderness.  Musculoskeletal: He exhibits no edema or tenderness.  Lymphadenopathy:    He has no cervical adenopathy.  Skin: No rash noted. No erythema.  Psychiatric: He has a normal mood and affect. His behavior is normal.    BP 122/78 (BP Location: Left Arm, Patient Position: Sitting, Cuff Size: Normal)   Pulse 80   Temp 98.2 F (36.8 C) (Oral)   Resp 18   Wt 164 lb 12.8 oz (74.8 kg)   SpO2 98%   BMI 23.65 kg/m  Wt Readings from Last 3 Encounters:  09/22/17 164 lb 12.8 oz (74.8 kg)  07/13/17 166 lb 6.4 oz (75.5 kg)  06/24/17 167 lb 9.6 oz (76 kg)     Lab Results  Component Value Date   WBC 6.0 03/10/2017    HGB 12.9 (L) 03/10/2017   HCT 38.6 (L) 03/10/2017   PLT 243.0 03/10/2017   GLUCOSE 100 (H) 03/10/2017   CHOL 191 03/10/2017   TRIG 83.0 03/10/2017   HDL 62.60 03/10/2017   LDLCALC 112 (H) 03/10/2017   ALT 15 04/26/2017   AST 20 04/26/2017   NA 141 03/10/2017   K 4.2 03/10/2017   CL 101 03/10/2017   CREATININE 0.92  03/10/2017   BUN 21 03/10/2017   CO2 32 03/10/2017   TSH 1.07 04/26/2017   PSA 2.19 05/12/2015   INR 1.03 05/29/2009   HGBA1C 5.7 03/10/2017    Dg Chest 2 View  Result Date: 07/12/2017 CLINICAL DATA:  Cough. EXAM: CHEST  2 VIEW COMPARISON:  Radiographs of June 30, 2015. FINDINGS: The heart size and mediastinal contours are within normal limits. Both lungs are clear. No pneumothorax or pleural effusion is noted. The visualized skeletal structures are unremarkable. IMPRESSION: No active cardiopulmonary disease. Electronically Signed   By: Marijo Conception, M.D.   On: 07/12/2017 15:46       Assessment & Plan:   Problem List Items Addressed This Visit    Elevated PSA    S/p biopsy.  Has been followed by urology.       Relevant Orders   PSA   Hyperglycemia    Low carb diet and exercise.  Follow met b and a1c.        Relevant Orders   Hemoglobin A1c   Hepatic function panel   Basic metabolic panel   Sleep difficulties    On seroquel.  Will taper.  Will decrease to 286m q day.  Will notify me how doing with current issues then taper.  Discussed melatonin.  Follow.        URI (upper respiratory infection)    With sinusitis/uri.  Treat with doxycycline.  Saline nasal spray and nasacort nasal spray as directed.  Discussed the need to stop continued use of afrin.  Follow.         Other Visit Diagnoses    Anemia, unspecified type    -  Primary   Relevant Orders   CBC with Differential/Platelet   TSH   Ferritin   Elevated TSH       Screening cholesterol level       Relevant Orders   Lipid panel       SEinar Pheasant MD

## 2017-09-26 NOTE — Discharge Instructions (Signed)
[facility] °[facility] ° °POST OPERATIVE INSTRUCTIONS FOR DR. TROXLER AND DR. FOWLER °[facility] ° ° °1. Take your medication as prescribed.  Pain medication should be taken only as needed. ° °2. Keep the dressing clean, dry and intact. ° °3. Keep your foot elevated above the heart level for the first 48 hours. ° °4. Walking to the bathroom and brief periods of walking are acceptable, unless we have instructed you to be non-weight bearing. ° °5. Always wear your post-op shoe when walking.  Always use your crutches if you are to be non-weight bearing. ° °6. Do not take a shower. Baths are permissible as long as the foot is kept out of the water.  ° °7. Every hour you are awake:  °- Bend your knee 15 times. °- Flex foot 15 times °- Massage calf 15 times ° °8. Call [facility] ([phone]) if any of the following problems occur: °- You develop a temperature or fever. °- The bandage becomes saturated with blood. °- Medication does not stop your pain. °- Injury of the foot occurs. °- Any symptoms of infection including redness, odor, or red streaks running from wound. ° ° °General Anesthesia, Adult, Care After °These instructions provide you with information about caring for yourself after your procedure. Your health care provider may also give you more specific instructions. Your treatment has been planned according to current medical practices, but problems sometimes occur. Call your health care provider if you have any problems or questions after your procedure. °What can I expect after the procedure? °After the procedure, it is common to have: °· Vomiting. °· A sore throat. °· Mental slowness. ° °It is common to feel: °· Nauseous. °· Cold or shivery. °· Sleepy. °· Tired. °· Sore or achy, even in parts of your body where you did not have surgery. ° °Follow these instructions at home: °For at least 24 hours after the procedure: °· Do not: °? Participate in  activities where you could fall or become injured. °? Drive. °? Use heavy machinery. °? Drink alcohol. °? Take sleeping pills or medicines that cause drowsiness. °? Make important decisions or sign legal documents. °? Take care of children on your own. °· Rest. °Eating and drinking °· If you vomit, drink water, juice, or soup when you can drink without vomiting. °· Drink enough fluid to keep your urine clear or pale yellow. °· Make sure you have little or no nausea before eating solid foods. °· Follow the diet recommended by your health care provider. °General instructions °· Have a responsible adult stay with you until you are awake and alert. °· Return to your normal activities as told by your health care provider. Ask your health care provider what activities are safe for you. °· Take over-the-counter and prescription medicines only as told by your health care provider. °· If you smoke, do not smoke without supervision. °· Keep all follow-up visits as told by your health care provider. This is important. °Contact a health care provider if: °· You continue to have nausea or vomiting at home, and medicines are not helpful. °· You cannot drink fluids or start eating again. °· You cannot urinate after 8-12 hours. °· You develop a skin rash. °· You have fever. °· You have increasing redness at the site of your procedure. °Get help right away if: °· You have difficulty breathing. °· You have chest pain. °· You have unexpected bleeding. °· You feel that you   are having a life-threatening or urgent problem. °This information is not intended to replace advice given to you by your health care provider. Make sure you discuss any questions you have with your health care provider. °Document Released: 08/16/2000 Document Revised: 10/13/2015 Document Reviewed: 04/24/2015 °Elsevier Interactive Patient Education © 2018 [employer] ° °

## 2017-09-27 DIAGNOSIS — M47812 Spondylosis without myelopathy or radiculopathy, cervical region: Secondary | ICD-10-CM | POA: Diagnosis not present

## 2017-09-28 ENCOUNTER — Encounter: Payer: Self-pay | Admitting: Internal Medicine

## 2017-09-28 ENCOUNTER — Encounter
Admission: RE | Disposition: A | Discharge status: Home / Self Care | Payer: Self-pay | Source: Ambulatory Visit | Attending: Podiatry

## 2017-09-28 ENCOUNTER — Ambulatory Visit: Payer: Medicare Other | Admitting: Anesthesiology

## 2017-09-28 ENCOUNTER — Ambulatory Visit
Admission: RE | Admit: 2017-09-28 | Discharge: 2017-09-28 | Disposition: A | Discharge status: Home / Self Care | Payer: Medicare Other | Source: Ambulatory Visit | Attending: Podiatry | Admitting: Podiatry

## 2017-09-28 DIAGNOSIS — Z880 Allergy status to penicillin: Secondary | ICD-10-CM | POA: Diagnosis not present

## 2017-09-28 DIAGNOSIS — R011 Cardiac murmur, unspecified: Secondary | ICD-10-CM | POA: Insufficient documentation

## 2017-09-28 DIAGNOSIS — Z96651 Presence of right artificial knee joint: Secondary | ICD-10-CM | POA: Diagnosis not present

## 2017-09-28 DIAGNOSIS — M19011 Primary osteoarthritis, right shoulder: Secondary | ICD-10-CM | POA: Insufficient documentation

## 2017-09-28 DIAGNOSIS — Z885 Allergy status to narcotic agent status: Secondary | ICD-10-CM | POA: Diagnosis not present

## 2017-09-28 DIAGNOSIS — Z87891 Personal history of nicotine dependence: Secondary | ICD-10-CM | POA: Insufficient documentation

## 2017-09-28 DIAGNOSIS — M21622 Bunionette of left foot: Secondary | ICD-10-CM | POA: Diagnosis not present

## 2017-09-28 DIAGNOSIS — Z981 Arthrodesis status: Secondary | ICD-10-CM | POA: Insufficient documentation

## 2017-09-28 HISTORY — PX: BONE EXCISION: SHX6730

## 2017-09-28 SURGERY — BONE EXCISION
Anesthesia: General | Laterality: Left | Wound class: Clean

## 2017-09-28 MED ORDER — LIDOCAINE HCL (PF) 1 % IJ SOLN
INTRAMUSCULAR | Status: DC | PRN
  Administered 2017-09-28: 5 mL

## 2017-09-28 MED ORDER — POVIDONE-IODINE 7.5 % EX SOLN
Freq: Once | CUTANEOUS | Status: AC
  Administered 2017-09-28: 13:00:00 via TOPICAL

## 2017-09-28 MED ORDER — DEXAMETHASONE SODIUM PHOSPHATE 4 MG/ML IJ SOLN
INTRAMUSCULAR | Status: DC | PRN
  Administered 2017-09-28: 4 mg via INTRAVENOUS

## 2017-09-28 MED ORDER — LIDOCAINE HCL (CARDIAC) PF 100 MG/5ML IV SOSY
PREFILLED_SYRINGE | INTRAVENOUS | Status: DC | PRN
  Administered 2017-09-28: 40 mg via INTRATRACHEAL

## 2017-09-28 MED ORDER — CLINDAMYCIN PHOSPHATE 900 MG/50ML IV SOLN
900.0000 mg | INTRAVENOUS | Status: AC
  Administered 2017-09-28: 900 mg via INTRAVENOUS

## 2017-09-28 MED ORDER — PROPOFOL 10 MG/ML IV BOLUS
INTRAVENOUS | Status: DC | PRN
  Administered 2017-09-28: 160 mg via INTRAVENOUS

## 2017-09-28 MED ORDER — MIDAZOLAM HCL 5 MG/5ML IJ SOLN
INTRAMUSCULAR | Status: DC | PRN
  Administered 2017-09-28: 1 mg via INTRAVENOUS

## 2017-09-28 MED ORDER — LACTATED RINGERS IV SOLN
INTRAVENOUS | Status: DC
  Administered 2017-09-28: 13:00:00 via INTRAVENOUS

## 2017-09-28 MED ORDER — GLYCOPYRROLATE 0.2 MG/ML IJ SOLN
INTRAMUSCULAR | Status: DC | PRN
  Administered 2017-09-28: 0.1 mg via INTRAVENOUS

## 2017-09-28 MED ORDER — FENTANYL CITRATE (PF) 100 MCG/2ML IJ SOLN: 25.0000 ug | INTRAMUSCULAR | Status: DC | PRN

## 2017-09-28 MED ORDER — ONDANSETRON HCL 4 MG/2ML IJ SOLN
INTRAMUSCULAR | Status: DC | PRN
  Administered 2017-09-28: 4 mg via INTRAVENOUS

## 2017-09-28 MED ORDER — BUPIVACAINE HCL (PF) 0.5 % IJ SOLN
INTRAMUSCULAR | Status: DC | PRN
  Administered 2017-09-28: 5 mL

## 2017-09-28 MED ORDER — ONDANSETRON HCL 4 MG PO TABS: 4.0000 mg | ORAL_TABLET | Freq: Four times a day (QID) | ORAL | Status: DC | PRN

## 2017-09-28 MED ORDER — FENTANYL CITRATE (PF) 100 MCG/2ML IJ SOLN
INTRAMUSCULAR | Status: DC | PRN
  Administered 2017-09-28: 50 ug via INTRAVENOUS

## 2017-09-28 MED ORDER — ONDANSETRON HCL 4 MG/2ML IJ SOLN: 4.0000 mg | Freq: Four times a day (QID) | INTRAMUSCULAR | Status: DC | PRN

## 2017-09-28 SURGICAL SUPPLY — 48 items
APL SKNCLS STERI-STRIP NONHPOA (GAUZE/BANDAGES/DRESSINGS) ×1
BANDAGE ELASTIC 4 VELCRO NS (GAUZE/BANDAGES/DRESSINGS) ×2 IMPLANT
BENZOIN TINCTURE PRP APPL 2/3 (GAUZE/BANDAGES/DRESSINGS) ×2 IMPLANT
BLADE MED AGGRESSIVE (BLADE) ×1 IMPLANT
BLADE OSC/SAGITTAL MD 5.5X18 (BLADE) IMPLANT
BLADE SURG 15 STRL LF DISP TIS (BLADE) IMPLANT
BLADE SURG 15 STRL SS (BLADE)
BNDG CMPR 75X41 PLY HI ABS (GAUZE/BANDAGES/DRESSINGS) ×1
BNDG COHESIVE 4X5 TAN STRL (GAUZE/BANDAGES/DRESSINGS) ×2 IMPLANT
BNDG ESMARK 4X12 TAN STRL LF (GAUZE/BANDAGES/DRESSINGS) ×2 IMPLANT
BNDG GAUZE 4.5X4.1 6PLY STRL (MISCELLANEOUS) ×2 IMPLANT
BNDG STRETCH 4X75 STRL LF (GAUZE/BANDAGES/DRESSINGS) ×2 IMPLANT
CANISTER SUCT 1200ML W/VALVE (MISCELLANEOUS) ×2 IMPLANT
COVER LIGHT HANDLE UNIVERSAL (MISCELLANEOUS) ×4 IMPLANT
CUFF TOURN SGL QUICK 18 (TOURNIQUET CUFF) ×1 IMPLANT
DRAPE FLUOR MINI C-ARM 54X84 (DRAPES) ×2 IMPLANT
DURAPREP 26ML APPLICATOR (WOUND CARE) ×2 IMPLANT
ELECT REM PT RETURN 9FT ADLT (ELECTROSURGICAL) ×2
ELECTRODE REM PT RTRN 9FT ADLT (ELECTROSURGICAL) ×1 IMPLANT
GAUZE PETRO XEROFOAM 1X8 (MISCELLANEOUS) ×2 IMPLANT
GAUZE SPONGE 4X4 12PLY STRL (GAUZE/BANDAGES/DRESSINGS) ×2 IMPLANT
GLOVE BIO SURGEON STRL SZ7.5 (GLOVE) ×2 IMPLANT
GLOVE INDICATOR 8.0 STRL GRN (GLOVE) ×2 IMPLANT
GOWN STRL REUS W/ TWL LRG LVL3 (GOWN DISPOSABLE) ×2 IMPLANT
GOWN STRL REUS W/TWL LRG LVL3 (GOWN DISPOSABLE) ×4
K-WIRE DBL END TROCAR 6X.045 (WIRE)
K-WIRE DBL END TROCAR 6X.062 (WIRE)
KIT TURNOVER KIT A (KITS) ×2 IMPLANT
KWIRE DBL END TROCAR 6X.045 (WIRE) IMPLANT
KWIRE DBL END TROCAR 6X.062 (WIRE) IMPLANT
NS IRRIG 500ML POUR BTL (IV SOLUTION) ×2 IMPLANT
PACK EXTREMITY ARMC (MISCELLANEOUS) ×2 IMPLANT
PIN BALLS 3/8 F/.045 WIRE (MISCELLANEOUS) ×1 IMPLANT
RASP SM TEAR CROSS CUT (RASP) ×1 IMPLANT
STOCKINETTE IMPERVIOUS LG (DRAPES) ×2 IMPLANT
STRAP BODY AND KNEE 60X3 (MISCELLANEOUS) ×2 IMPLANT
STRIP CLOSURE SKIN 1/4X4 (GAUZE/BANDAGES/DRESSINGS) ×2 IMPLANT
SUT ETHILON 4-0 (SUTURE)
SUT ETHILON 4-0 FS2 18XMFL BLK (SUTURE)
SUT ETHILON 5-0 FS-2 18 BLK (SUTURE) IMPLANT
SUT MNCRL 5-0+ PC-1 (SUTURE) IMPLANT
SUT MONOCRYL 5-0 (SUTURE) ×1
SUT VIC AB 2-0 SH 27 (SUTURE)
SUT VIC AB 2-0 SH 27XBRD (SUTURE) IMPLANT
SUT VIC AB 3-0 SH 27 (SUTURE)
SUT VIC AB 3-0 SH 27X BRD (SUTURE) IMPLANT
SUT VIC AB 4-0 FS2 27 (SUTURE) ×1 IMPLANT
SUTURE ETHLN 4-0 FS2 18XMF BLK (SUTURE) IMPLANT

## 2017-09-28 NOTE — Assessment & Plan Note (Signed)
On seroquel.  Will taper.  Will decrease to 200mg  q day.  Will notify me how doing with current issues then taper.  Discussed melatonin.  Follow.

## 2017-09-28 NOTE — Anesthesia Procedure Notes (Signed)
Procedure Name: LMA Insertion Date/Time: 09/28/2017 1:23 PM Performed by: Mayme Genta, CRNA Pre-anesthesia Checklist: Patient identified, Emergency Drugs available, Suction available, Timeout performed and Patient being monitored Patient Re-evaluated:Patient Re-evaluated prior to induction Oxygen Delivery Method: Circle system utilized Preoxygenation: Pre-oxygenation with 100% oxygen Induction Type: IV induction LMA: LMA inserted LMA Size: 4.0 Number of attempts: 1 Placement Confirmation: positive ETCO2 and breath sounds checked- equal and bilateral Tube secured with: Tape

## 2017-09-28 NOTE — Assessment & Plan Note (Signed)
With sinusitis/uri.  Treat with doxycycline.  Saline nasal spray and nasacort nasal spray as directed.  Discussed the need to stop continued use of afrin.  Follow.

## 2017-09-28 NOTE — Assessment & Plan Note (Signed)
Low carb diet and exercise.  Follow met b and a1c.   

## 2017-09-28 NOTE — H&P (Signed)
HISTORY AND PHYSICAL INTERVAL NOTE:  09/28/2017  1:09 PM  Coy Saunas  has presented today for surgery, with the diagnosis of Talior's bunion-Left foot.  The various methods of treatment have been discussed with the patient.  No guarantees were given.  After consideration of risks, benefits and other options for treatment, the patient has consented to surgery.  I have reviewed the patients' chart and labs.    Patient Vitals for the past 24 hrs:  BP Temp Pulse SpO2 Weight  09/28/17 1233 132/84 98.1 F (36.7 C) 78 99 % 74.4 kg (164 lb)    A history and physical examination was performed in my office.  The patient was reexamined.  There have been no changes to this history and physical examination.  Samara Deist A

## 2017-09-28 NOTE — Anesthesia Postprocedure Evaluation (Signed)
Anesthesia Post Note  Patient: Mike Farley  Procedure(s) Performed: BONE EXCISION-TAILORS  EXOSTECTOMY (Left )  Patient location during evaluation: PACU Anesthesia Type: General Level of consciousness: awake and alert Pain management: pain level controlled Vital Signs Assessment: post-procedure vital signs reviewed and stable Respiratory status: spontaneous breathing Cardiovascular status: blood pressure returned to baseline Postop Assessment: no headache Anesthetic complications: no    Jaci Standard, III,  [place] D

## 2017-09-28 NOTE — Assessment & Plan Note (Signed)
S/p biopsy.  Has been followed by urology.

## 2017-09-28 NOTE — Transfer of Care (Signed)
Immediate Anesthesia Transfer of Care Note  Patient: Mike Farley  Procedure(s) Performed: BONE EXCISION-TAILORS  EXOSTECTOMY (Left )  Patient Location: PACU  Anesthesia Type: General LMA  Level of Consciousness: awake, alert  and patient cooperative  Airway and Oxygen Therapy: Patient Spontanous Breathing and Patient connected to supplemental oxygen  Post-op Assessment: Post-op Vital signs reviewed, Patient's Cardiovascular Status Stable, Respiratory Function Stable, Patent Airway and No signs of Nausea or vomiting  Post-op Vital Signs: Reviewed and stable  Complications: No apparent anesthesia complications

## 2017-09-28 NOTE — Op Note (Signed)
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 that presents today for surgical intervention.  The risks/benefits/alternatives/complications have been discussed and consent has been given.    Procedure:  Patient was brought into the OR and placed on the operating table in thesupine position. After anesthesia was obtained theleft lower extremity was prepped and draped in usual sterile fashion.  Attention was directed to the lateral aspect of the left fifth metatarsal where a dorsomedial incision was performed.  Sharp and blunt dissection carried down to the capsule.  Longitudinal capsulotomy was then performed.  The metatarsophalangeal joint was then exposed.  Prominence of the distal lateral fifth metatarsal head was noted.  At this time with a power saw the prominence was removed.  This was further smoothed with a power rasp both lateral and plantar.  The wound was flushed with copious amounts irrigation.  Good removal of bone was noted.  Closure was performed with a 4-0 Vicryl for the capsule and subcutaneous tissue and a 5-0 Monocryl for skin.  Bulky sterile dressing was then applied.    Patient tolerated the procedure and anesthesia well.  Was transported from the OR to the PACU with all vital signs stable and vascular status intact. To be discharged per routine protocol.  Will follow up in approximately 1 week in the outpatient clinic.

## 2017-09-29 ENCOUNTER — Other Ambulatory Visit: Payer: Self-pay | Admitting: Internal Medicine

## 2017-09-29 ENCOUNTER — Encounter: Payer: Self-pay | Admitting: Podiatry

## 2017-10-03 DIAGNOSIS — M19011 Primary osteoarthritis, right shoulder: Secondary | ICD-10-CM | POA: Diagnosis not present

## 2017-10-03 DIAGNOSIS — Z9889 Other specified postprocedural states: Secondary | ICD-10-CM | POA: Diagnosis not present

## 2017-10-03 DIAGNOSIS — M67921 Unspecified disorder of synovium and tendon, right upper arm: Secondary | ICD-10-CM | POA: Diagnosis not present

## 2017-10-03 DIAGNOSIS — M75121 Complete rotator cuff tear or rupture of right shoulder, not specified as traumatic: Secondary | ICD-10-CM | POA: Diagnosis not present

## 2017-10-03 DIAGNOSIS — M12811 Other specific arthropathies, not elsewhere classified, right shoulder: Secondary | ICD-10-CM | POA: Diagnosis not present

## 2017-10-03 DIAGNOSIS — M7541 Impingement syndrome of right shoulder: Secondary | ICD-10-CM | POA: Diagnosis not present

## 2017-10-03 DIAGNOSIS — M24111 Other articular cartilage disorders, right shoulder: Secondary | ICD-10-CM | POA: Diagnosis not present

## 2017-10-04 DIAGNOSIS — M2012 Hallux valgus (acquired), left foot: Secondary | ICD-10-CM | POA: Diagnosis not present

## 2017-10-06 ENCOUNTER — Ambulatory Visit (INDEPENDENT_AMBULATORY_CARE_PROVIDER_SITE_OTHER): Payer: Medicare Other

## 2017-10-06 ENCOUNTER — Encounter: Payer: Self-pay | Admitting: Internal Medicine

## 2017-10-06 VITALS — BP 138/86 | HR 81 | Temp 98.7°F | Resp 14 | Ht 68.0 in | Wt 164.8 lb

## 2017-10-06 DIAGNOSIS — Z1159 Encounter for screening for other viral diseases: Secondary | ICD-10-CM | POA: Diagnosis not present

## 2017-10-06 DIAGNOSIS — H918X9 Other specified hearing loss, unspecified ear: Secondary | ICD-10-CM | POA: Diagnosis not present

## 2017-10-06 DIAGNOSIS — Z0000 Encounter for general adult medical examination without abnormal findings: Secondary | ICD-10-CM

## 2017-10-06 NOTE — Patient Instructions (Addendum)
Mike Farley , Thank you for taking time to come for your Medicare Wellness Visit. I appreciate your ongoing commitment to your health goals. Please review the following plan we discussed and let me know if I can assist you in the future.   Follow up as needed.    Bring a copy of your [place] and/or Living Will to be scanned into chart.  Audiology referral placed; follow as directed.   Have a great day!  These are the goals we discussed: Goals    . Increase physical activity     Exercise as tolerated       This is a list of the screening recommended for you and due dates:  Health Maintenance  Topic Date Due  .  Hepatitis C: One time screening is recommended by Center for Disease Control  (CDC) for  adults born from [birth year] through 1965.   [birth year]  . Colon Cancer Screening  04/15/2011  . Flu Shot  12/22/2017  . Tetanus Vaccine  10/20/2025  . Pneumonia vaccines  Completed    Colonoscopy, Adult A colonoscopy is an exam to look at the entire large intestine. During the exam, a lubricated, bendable tube is inserted into the anus and then passed into the rectum, colon, and other parts of the large intestine. A colonoscopy is often done as a part of normal colorectal screening or in response to certain symptoms, such as anemia, persistent diarrhea, abdominal pain, and blood in the stool. The exam can help screen for and diagnose medical problems, including:  Tumors.  Polyps.  Inflammation.  Areas of bleeding.  Tell a health care provider about:  Any allergies you have.  All medicines you are taking, including vitamins, herbs, eye drops, creams, and over-the-counter medicines.  Any problems you or family members have had with anesthetic medicines.  Any blood disorders you have.  Any surgeries you have had.  Any medical conditions you have.  Any problems you have had passing stool. What are the risks? Generally, this is a safe procedure.  However, problems may occur, including:  Bleeding.  A tear in the intestine.  A reaction to medicines given during the exam.  Infection (rare).  What happens before the procedure? Eating and drinking restrictions Follow instructions from your health care provider about eating and drinking, which may include:  A few days before the procedure - follow a low-fiber diet. Avoid nuts, seeds, dried fruit, raw fruits, and vegetables.  1-3 days before the procedure - follow a clear liquid diet. Drink only clear liquids, such as clear broth or bouillon, black coffee or tea, clear juice, clear soft drinks or sports drinks, gelatin dessert, and popsicles. Avoid any liquids that contain red or purple dye.  On the day of the procedure - do not eat or drink anything during the 2 hours before the procedure, or within the time period that your health care provider recommends.  Bowel prep If you were prescribed an oral bowel prep to clean out your colon:  Take it as told by your health care provider. Starting the day before your procedure, you will need to drink a large amount of medicated liquid. The liquid will cause you to have multiple loose stools until your stool is almost clear or light green.  If your skin or anus gets irritated from diarrhea, you may use these to relieve the irritation: ? Medicated wipes, such as adult wet wipes with aloe and vitamin E. ? A skin soothing-product  like petroleum jelly.  If you vomit while drinking the bowel prep, take a break for up to 60 minutes and then begin the bowel prep again. If vomiting continues and you cannot take the bowel prep without vomiting, call your health care provider.  General instructions  Ask your health care provider about changing or stopping your regular medicines. This is especially important if you are taking diabetes medicines or blood thinners.  Plan to have someone take you home from the hospital or clinic. What happens during  the procedure?  An IV tube may be inserted into one of your veins.  You will be given medicine to help you relax (sedative).  To reduce your risk of infection: ? Your health care team will wash or sanitize their hands. ? Your anal area will be washed with soap.  You will be asked to lie on your side with your knees bent.  Your health care provider will lubricate a long, thin, flexible tube. The tube will have a camera and a light on the end.  The tube will be inserted into your anus.  The tube will be gently eased through your rectum and colon.  Air will be delivered into your colon to keep it open. You may feel some pressure or cramping.  The camera will be used to take images during the procedure.  A small tissue sample may be removed from your body to be examined under a microscope (biopsy). If any potential problems are found, the tissue will be sent to a lab for testing.  If small polyps are found, your health care provider may remove them and have them checked for cancer cells.  The tube that was inserted into your anus will be slowly removed. The procedure may vary among health care providers and hospitals. What happens after the procedure?  Your blood pressure, heart rate, breathing rate, and blood oxygen level will be monitored until the medicines you were given have worn off.  Do not drive for 24 hours after the exam.  You may have a small amount of blood in your stool.  You may pass gas and have mild abdominal cramping or bloating due to the air that was used to inflate your colon during the exam.  It is up to you to get the results of your procedure. Ask your health care provider, or the department performing the procedure, when your results will be ready. This information is not intended to replace advice given to you by your health care provider. Make sure you discuss any questions you have with your health care provider. Document Released: 05/07/2000 Document  Revised: 03/10/2016 Document Reviewed: 07/22/2015 Elsevier Interactive Patient Education  2018 [employer].   Hearing Loss Hearing loss is a partial or total loss of the ability to hear. This can be temporary or permanent, and it can happen in one or both ears. Hearing loss may be referred to as deafness. Medical care is necessary to treat hearing loss properly and to prevent the condition from getting worse. Your hearing may partially or completely come back, depending on what caused your hearing loss and how severe it is. In some cases, hearing loss is permanent. What are the causes? Common causes of hearing loss include:  Too much wax in the ear canal.  Infection of the ear canal or middle ear.  Fluid in the middle ear.  Injury to the ear or surrounding area.  An object stuck in the ear.  Prolonged exposure to loud  sounds, such as music.  Less common causes of hearing loss include:  Tumors in the ear.  Viral or bacterial infections, such as meningitis.  A hole in the eardrum (perforated eardrum).  Problems with the hearing nerve that sends signals between the brain and the ear.  Certain medicines.  What are the signs or symptoms? Symptoms of this condition may include:  Difficulty telling the difference between sounds.  Difficulty following a conversation when there is background noise.  Lack of response to sounds in your environment. This may be most noticeable when you do not respond to startling sounds.  Needing to turn up the volume on the television, radio, etc.  Ringing in the ears.  Dizziness.  Pain in the ears.  How is this diagnosed? This condition is diagnosed based on a physical exam and a hearing test (audiometry). The audiometry test will be performed by a hearing specialist (audiologist). You may also be referred to an ear, nose, and throat (ENT) specialist (otolaryngologist). How is this treated? Treatment for recent onset of hearing loss may  include:  Ear wax removal.  Being prescribed medicines to prevent infection (antibiotics).  Being prescribed medicines to reduce inflammation (corticosteroids).  Follow these instructions at home:  If you were prescribed an antibiotic medicine, take it as told by your health care provider. Do not stop taking the antibiotic even if you start to feel better.  Take over-the-counter and prescription medicines only as told by your health care provider.  Avoid loud noises.  Return to your normal activities as told by your health care provider. Ask your health care provider what activities are safe for you.  Keep all follow-up visits as told by your health care provider. This is important. Contact a health care provider if:  You feel dizzy.  You develop new symptoms.  You vomit or feel nauseous.  You have a fever. Get help right away if:  You develop sudden changes in your vision.  You have severe ear pain.  You have new or increased weakness.  You have a severe headache. This information is not intended to replace advice given to you by your health care provider. Make sure you discuss any questions you have with your health care provider. Document Released: 05/10/2005 Document Revised: 10/16/2015 Document Reviewed: 09/25/2014 Elsevier Interactive Patient Education  2018 [employer].

## 2017-10-06 NOTE — Progress Notes (Addendum)
Subjective:   Mike Farley is a 67 y.o. male who presents for an Initial Medicare Annual Wellness Visit.  Review of Systems    No ROS.  Medicare Wellness Visit. Additional risk factors are reflected in the social history.  Cardiac Risk Factors include: advanced age (>67men, >44 women);male gender     Objective:    Today's Vitals   10/06/17 1105  BP: 138/86  Pulse: 81  Resp: 14  Temp: 98.7 F (37.1 C)  TempSrc: Oral  SpO2: 98%  Weight: 164 lb 12.8 oz (74.8 kg)  Height: 5\' 8"  (1.727 m)   Body mass index is 25.06 kg/m.  Advanced Directives 09/28/2017 10/22/2016  Does Patient Have a Medical Advance Directive? Yes No  Type of Paramedic of [place];Living will -  Copy of [place] in Chart? No - copy requested -  Would patient like information on creating a medical advance directive? - No - Patient declined    Current Medications (verified) Outpatient Encounter Medications as of 10/06/2017  Medication Sig  . ibuprofen (ADVIL,MOTRIN) 200 MG tablet Take 600 mg by mouth every 6 (six) hours as needed.  . Multiple Vitamin (MULTIVITAMIN) tablet Take 1 tablet by mouth daily.  . naproxen sodium (ALEVE) 220 MG tablet Take 220 mg by mouth.  . Oxycodone HCl 20 MG TABS   . Pseudoephedrine HCl (NASAL DECONGESTANT PO) Take by mouth.  . QUEtiapine (SEROQUEL) 300 MG tablet TAKE ONE TABLET AT BEDTIME  . rosuvastatin (CRESTOR) 10 MG tablet TAKE 1 TABLET BY MOUTH DAILY  . Triamcinolone Acetonide (NASACORT ALLERGY 24HR NA) Place 1 spray into the nose at bedtime as needed.  . valACYclovir (VALTREX) 500 MG tablet TAKE ONE TABLET TWICE DAILY  . [DISCONTINUED] doxycycline (VIBRA-TABS) 100 MG tablet Take 1 tablet (100 mg total) by mouth 2 (two) times daily.   No facility-administered encounter medications on file as of 10/06/2017.     Allergies (verified) Morphine and Amoxicillin   History: Past Medical History:  Diagnosis Date  . Cervical  pain (neck)    limited mobility  . Chronic pain   . Depression    years ago (minor)   Past Surgical History:  Procedure Laterality Date  . ANTERIOR FUSION CERVICAL SPINE  2011   4-5 levels  . APPENDECTOMY    . BONE EXCISION Left 09/28/2017   Procedure: BONE EXCISION-TAILORS  EXOSTECTOMY;  Surgeon: Samara Deist, DPM;  Location: [facility];  Service: Podiatry;  Laterality: Left;  IVA LOCAL  . CERVICAL DISCECTOMY  2008   posterior  . CHOLECYSTECTOMY    . ELBOW FRACTURE SURGERY Left 2004  . SHOULDER SURGERY Right   . TONSILLECTOMY AND ADENOIDECTOMY    . TOTAL KNEE ARTHROPLASTY Right 2005   x 4   Family History  Problem Relation Age of Onset  . Arthritis Mother   . Cancer Mother        colon & breast cnacer  . Mental illness Mother        Depression & bipolar  . Cancer Father        lung & prostate cancer  . Alcohol abuse Brother   . Kidney disease Brother    Social History   Socioeconomic History  . Marital status: Married    Spouse name: Not on file  . Number of children: Not on file  . Years of education: Not on file  . Highest education level: Not on file  Occupational History  . Not on file  Social Needs  . Financial resource strain: Not hard at all  . Food insecurity:    Worry: Never true    Inability: Never true  . Transportation needs:    Medical: No    Non-medical: No  Tobacco Use  . Smoking status: Former Research scientist (life sciences)  . Smokeless tobacco: Current User    Types: Chew  Substance and Sexual Activity  . Alcohol use: Yes    Alcohol/week: 0.0 oz    Comment: socially  . Drug use: Not on file  . Sexual activity: Not on file  Lifestyle  . Physical activity:    Days per week: Not on file    Minutes per session: Not on file  . Stress: Not on file  Relationships  . Social connections:    Talks on phone: Not on file    Gets together: Not on file    Attends religious service: Not on file    Active member of club or organization: Not on file     Attends meetings of clubs or organizations: Not on file    Relationship status: Not on file  Other Topics Concern  . Not on file  Social History Narrative  . Not on file    Tobacco Counseling Ready to quit: Not Answered Counseling given: No   Clinical Intake:                        Activities of Daily Living In your present state of health, do you have any difficulty performing the following activities: 10/06/2017 09/28/2017  Hearing? Y N  Vision? N N  Difficulty concentrating or making decisions? N N  Walking or climbing stairs? Y N  Dressing or bathing? N N  Doing errands, shopping? N -  Preparing Food and eating ? N -  Using the Toilet? N -  In the past six months, have you accidently leaked urine? N -  Do you have problems with loss of bowel control? N -  Managing your Medications? N -  Managing your Finances? N -  Housekeeping or managing your Housekeeping? N -  Some recent data might be hidden     Immunizations and Health Maintenance Immunization History  Administered Date(s) Administered  . Influenza-Unspecified 04/22/2015  . Pneumococcal Conjugate-13 06/27/2015  . Pneumococcal Polysaccharide-23 06/24/2017  . Tdap 10/21/2015   Health Maintenance Due  Topic Date Due  . Hepatitis C Screening  1950-07-29  . COLONOSCOPY  04/15/2011    Patient Care Team: Einar Pheasant, MD as PCP - General (Internal Medicine)  Indicate any recent Medical Services you may have received from other than [facility] providers in the past year (date may be approximate).     Assessment:   This is a routine wellness examination for Mike Farley.  The goal of the wellness visit is to assist the patient how to close the gaps in care and create a preventative care plan for the patient.   The roster of all physicians providing medical care to patient is listed in the Snapshot section of the chart.  Osteoporosis risk reviewed.    Safety issues reviewed; Smoke and carbon monoxide  detectors in the home. No firearms in the home. Wears seatbelts when driving or riding with others. No violence in the home.  They do not have excessive sun exposure.  Discussed the need for sun protection: hats, long sleeves and the use of sunscreen if there is significant sun exposure.  Patient is alert, normal appearance, oriented to  person/place/and time.  Correctly identified the president of the [place] and recalls of 3/3 words. Performs simple calculations and can read correct time from watch face. Displays appropriate judgement.  No new identified risk were noted.  No failures at ADL's or IADL's.    BMI- discussed the importance of a healthy diet, water intake and the benefits of aerobic exercise. Educational material provided.   24 hour diet recall: Regular diet  Dental- every 6 months.  Eye- Visual acuity not assessed per patient preference since they have regular follow up with the ophthalmologist.    Sleep patterns- Sleeps through the night without issues. Taking medication as prescribed.   Colonoscopy discussed. Educational material provided.   Audiology referral placed per patient request.   Patient Concerns: None at this time. Follow up with PCP as needed.  Hearing/Vision screen Hearing Screening Comments: Patient has difficiulty hearing conversational tones in a crowd.  Audiology referral placed.  Vision Screening Comments: Followed by Dr. Chong Sicilian Wears corrective lenses.  Visual acuity not assessed per patient preference since they have regular follow up with the ophthalmologist  Dietary issues and exercise activities discussed: Current Exercise Habits: The patient does not participate in regular exercise at present  Goals    . Increase physical activity     Exercise as tolerated      Depression Screen PHQ 2/9 Scores 10/06/2017 02/25/2017 06/27/2015  PHQ - 2 Score 0 0 0    Fall Risk Fall Risk  10/06/2017 02/25/2017 06/27/2015  Falls in the past year? No No No     Cognitive Function: MMSE - Mini Mental State Exam 10/06/2017  Orientation to time 5  Orientation to Place 5  Registration 3  Attention/ Calculation 5  Recall 3  Language- name 2 objects 2  Language- repeat 1  Language- follow 3 step command 3  Language- read & follow direction 1  Write a sentence 1  Copy design 1  Total score 30        Screening Tests Health Maintenance  Topic Date Due  . Hepatitis C Screening  1951-05-10  . COLONOSCOPY  04/15/2011  . INFLUENZA VACCINE  12/22/2017  . TETANUS/TDAP  10/20/2025  . PNA vac Low Risk Adult  Completed     Plan:    End of life planning; Advance aging; Advanced directives discussed. Copy of current HCPOA/Living Will requested.    I have personally reviewed and noted the following in the patient's chart:   . Medical and social history . Use of alcohol, tobacco or illicit drugs  . Current medications and supplements . Functional ability and status . Nutritional status . Physical activity . Advanced directives . List of other physicians . Hospitalizations, surgeries, and ER visits in previous 12 months . Vitals . Screenings to include cognitive, depression, and falls . Referrals and appointments  In addition, I have reviewed and discussed with patient certain preventive protocols, quality metrics, and best practice recommendations. A written personalized care plan for preventive services as well as general preventive health recommendations were provided to patient.     Varney Biles, LPN   05/24/7508    Reviewed above information.  Agree with assessment and plan.   Dr Nicki Reaper

## 2017-10-10 NOTE — Telephone Encounter (Signed)
Need copy of colonoscopy from Dr Elliot's office.  Please have them fax.

## 2017-10-26 DIAGNOSIS — H903 Sensorineural hearing loss, bilateral: Secondary | ICD-10-CM | POA: Diagnosis not present

## 2017-10-31 ENCOUNTER — Other Ambulatory Visit: Payer: Self-pay | Admitting: Internal Medicine

## 2017-11-10 ENCOUNTER — Other Ambulatory Visit: Payer: Self-pay | Admitting: Internal Medicine

## 2017-11-10 NOTE — Telephone Encounter (Signed)
Last OV 09/22/2017 Next OV 01/31/2018 Last refill 09/15/2017

## 2017-11-11 NOTE — Telephone Encounter (Signed)
Need to clarify what dose of seroquel pt is taking.  Had wanted to decrease dose.  Per our discussion, had discussed decreasing to 200mg  per day.  Do not mind refilling, just need to clarify.

## 2017-11-13 NOTE — Telephone Encounter (Signed)
Need to clarify dose he needs refilled.  Thanks  See previous note.

## 2017-11-14 MED ORDER — QUETIAPINE FUMARATE 200 MG PO TABS: 200.0000 mg | ORAL_TABLET | Freq: Every day | ORAL | 1 refills | Status: DC

## 2017-11-14 NOTE — Telephone Encounter (Signed)
Patient stated that he is willing to try the seroquel at a reduce dose he just had the 300 mg filled 11/09/17 BUt next dosage he would be glad to try 200 mg dose.Marland Kitchen

## 2017-11-14 NOTE — Telephone Encounter (Signed)
rx sent in for seroquel 200mg  #30 with one refill.  If he just got 300mg  seroquel, then have him take 300mg  alternating with 200mg  qod for two weeks and if doing ok, then continue 200mg  q day.  Let us know if any problems.

## 2017-11-18 DIAGNOSIS — L821 Other seborrheic keratosis: Secondary | ICD-10-CM | POA: Diagnosis not present

## 2017-11-18 DIAGNOSIS — D485 Neoplasm of uncertain behavior of skin: Secondary | ICD-10-CM | POA: Diagnosis not present

## 2017-11-23 NOTE — Telephone Encounter (Signed)
Patient notified

## 2017-11-30 ENCOUNTER — Other Ambulatory Visit: Payer: Self-pay | Admitting: Internal Medicine

## 2017-12-08 ENCOUNTER — Other Ambulatory Visit: Payer: Self-pay | Admitting: Internal Medicine

## 2017-12-12 DIAGNOSIS — M7541 Impingement syndrome of right shoulder: Secondary | ICD-10-CM | POA: Diagnosis not present

## 2017-12-12 DIAGNOSIS — M12811 Other specific arthropathies, not elsewhere classified, right shoulder: Secondary | ICD-10-CM | POA: Diagnosis not present

## 2017-12-12 DIAGNOSIS — M75101 Unspecified rotator cuff tear or rupture of right shoulder, not specified as traumatic: Secondary | ICD-10-CM | POA: Diagnosis not present

## 2017-12-12 DIAGNOSIS — M24111 Other articular cartilage disorders, right shoulder: Secondary | ICD-10-CM | POA: Diagnosis not present

## 2017-12-12 DIAGNOSIS — M19011 Primary osteoarthritis, right shoulder: Secondary | ICD-10-CM | POA: Diagnosis not present

## 2017-12-12 DIAGNOSIS — M67921 Unspecified disorder of synovium and tendon, right upper arm: Secondary | ICD-10-CM | POA: Diagnosis not present

## 2017-12-12 DIAGNOSIS — M75121 Complete rotator cuff tear or rupture of right shoulder, not specified as traumatic: Secondary | ICD-10-CM | POA: Diagnosis not present

## 2017-12-12 NOTE — Telephone Encounter (Signed)
Last OV 5/19, last refill 11/14/17. Ok to fill?

## 2017-12-12 NOTE — Telephone Encounter (Signed)
Per last conversation with pt, he wanted to decrease dose to 200mg .  200mg  seroquel was sent in for him.  Should not need to 300mg  dose now.  Let me know if a problem.

## 2017-12-25 DIAGNOSIS — J209 Acute bronchitis, unspecified: Secondary | ICD-10-CM | POA: Diagnosis not present

## 2017-12-25 DIAGNOSIS — R05 Cough: Secondary | ICD-10-CM | POA: Diagnosis not present

## 2017-12-26 ENCOUNTER — Encounter: Payer: Self-pay | Admitting: Internal Medicine

## 2017-12-26 NOTE — Telephone Encounter (Signed)
Called and scheduled patient for appt with Dr. Nicki Reaper tomorrow morning.

## 2017-12-27 ENCOUNTER — Encounter: Payer: Self-pay | Admitting: Internal Medicine

## 2017-12-27 ENCOUNTER — Ambulatory Visit (INDEPENDENT_AMBULATORY_CARE_PROVIDER_SITE_OTHER): Payer: Medicare Other | Admitting: Internal Medicine

## 2017-12-27 DIAGNOSIS — G479 Sleep disorder, unspecified: Secondary | ICD-10-CM | POA: Diagnosis not present

## 2017-12-27 DIAGNOSIS — J069 Acute upper respiratory infection, unspecified: Secondary | ICD-10-CM

## 2017-12-27 MED ORDER — PREDNISONE 10 MG PO TABS: ORAL_TABLET | ORAL | 0 refills | Status: DC

## 2017-12-27 MED ORDER — QUETIAPINE FUMARATE 300 MG PO TABS: 300.0000 mg | ORAL_TABLET | Freq: Every day | ORAL | 1 refills | Status: DC

## 2017-12-27 MED ORDER — DOXYCYCLINE HYCLATE 100 MG PO TABS: 100.0000 mg | ORAL_TABLET | Freq: Two times a day (BID) | ORAL | 0 refills | Status: DC

## 2017-12-27 NOTE — Progress Notes (Signed)
Patient ID: Mike Farley, male   DOB: 1951/05/19, 67 y.o.   MRN: [id number]   Subjective:    Patient ID: Mike Farley, male    DOB: 24-Aug-1950, 67 y.o.   MRN: [id number]  HPI  Patient here as a work in with concerns regarding cough and congestion.  States symptoms started approximately one week ago.  Reports increased sinus congestion.  Some drainage.  States have moved into his chest.  Increased cough - productive.  No wheezing.  No fever.  No vomiting or diarrhea.  Eating.  Was seen at [facility].  Instructed to take mucinex and delsym.  Symptoms have progressed.  He also reports that he feels he needs seroquel dose increased.  Was previously on 300mg  daily.  Wanted to decrease the dose.  Now on 200mg  q day and feels he needs to increase back to the 300mg  dose.     Past Medical History:  Diagnosis Date  . Cervical pain (neck)    limited mobility  . Chronic pain   . Depression    years ago (minor)   Past Surgical History:  Procedure Laterality Date  . ANTERIOR FUSION CERVICAL SPINE  2011   4-5 levels  . APPENDECTOMY    . BONE EXCISION Left 09/28/2017   Procedure: BONE EXCISION-TAILORS  EXOSTECTOMY;  Surgeon: Samara Deist, DPM;  Location: [facility];  Service: Podiatry;  Laterality: Left;  IVA LOCAL  . CERVICAL DISCECTOMY  2008   posterior  . CHOLECYSTECTOMY    . ELBOW FRACTURE SURGERY Left 2004  . SHOULDER SURGERY Right   . TONSILLECTOMY AND ADENOIDECTOMY    . TOTAL KNEE ARTHROPLASTY Right 2005   x 4   Family History  Problem Relation Age of Onset  . Arthritis Mother   . Cancer Mother        colon & breast cnacer  . Mental illness Mother        Depression & bipolar  . Cancer Father        lung & prostate cancer  . Alcohol abuse Brother   . Kidney disease Brother    Social History   Socioeconomic History  . Marital status: Married    Spouse name: Not on file  . Number of children: Not on file  . Years of education: Not on file  . Highest  education level: Not on file  Occupational History  . Not on file  Social Needs  . Financial resource strain: Not hard at all  . Food insecurity:    Worry: Never true    Inability: Never true  . Transportation needs:    Medical: No    Non-medical: No  Tobacco Use  . Smoking status: Former Research scientist (life sciences)  . Smokeless tobacco: Current User    Types: Chew  Substance and Sexual Activity  . Alcohol use: Yes    Alcohol/week: 0.0 oz    Comment: socially  . Drug use: Not on file  . Sexual activity: Not on file  Lifestyle  . Physical activity:    Days per week: Not on file    Minutes per session: Not on file  . Stress: Not on file  Relationships  . Social connections:    Talks on phone: Not on file    Gets together: Not on file    Attends religious service: Not on file    Active member of club or organization: Not on file    Attends meetings of clubs or organizations: Not on file  Relationship status: Not on file  Other Topics Concern  . Not on file  Social History Narrative  . Not on file    Outpatient Encounter Medications as of 12/27/2017  Medication Sig  . doxycycline (VIBRA-TABS) 100 MG tablet Take 1 tablet (100 mg total) by mouth 2 (two) times daily.  Marland Kitchen ibuprofen (ADVIL,MOTRIN) 200 MG tablet Take 600 mg by mouth every 6 (six) hours as needed.  . meloxicam (MOBIC) 15 MG tablet   . Multiple Vitamin (MULTIVITAMIN) tablet Take 1 tablet by mouth daily.  . naproxen sodium (ALEVE) 220 MG tablet Take 220 mg by mouth.  . Oxycodone HCl 20 MG TABS   . predniSONE (DELTASONE) 10 MG tablet Take 6 tablets x 1 day and then decrease by 1/2 tablet per day until down to zero mg.  . Pseudoephedrine HCl (NASAL DECONGESTANT PO) Take by mouth.  . QUEtiapine (SEROQUEL) 300 MG tablet Take 1 tablet (300 mg total) by mouth at bedtime.  . rosuvastatin (CRESTOR) 10 MG tablet TAKE ONE TABLET BY MOUTH EVERY DAY  . Triamcinolone Acetonide (NASACORT ALLERGY 24HR NA) Place 1 spray into the nose at bedtime as  needed.  . valACYclovir (VALTREX) 500 MG tablet TAKE ONE TABLET TWICE DAILY  . [DISCONTINUED] QUEtiapine (SEROQUEL) 200 MG tablet Take 1 tablet (200 mg total) by mouth at bedtime.   No facility-administered encounter medications on file as of 12/27/2017.     Review of Systems  Constitutional: Negative for appetite change and fever.  HENT: Positive for congestion, postnasal drip and sinus pressure.   Respiratory: Positive for cough. Negative for chest tightness, shortness of breath and wheezing.   Cardiovascular: Negative for chest pain, palpitations and leg swelling.  Gastrointestinal: Negative for abdominal pain, diarrhea, nausea and vomiting.  Musculoskeletal: Negative for joint swelling and myalgias.  Skin: Negative for color change and rash.  Neurological: Negative for dizziness, light-headedness and headaches.  Psychiatric/Behavioral: Negative for agitation and dysphoric mood.       Objective:    Physical Exam  Constitutional: He appears well-developed and well-nourished. No distress.  HENT:  Mouth/Throat: Oropharynx is clear and moist.  Nares - slightly erythematous turbinates.    Neck: Neck supple.  Cardiovascular: Normal rate and regular rhythm.  Pulmonary/Chest: Effort normal and breath sounds normal.  Increased cough with forced expiration.    Musculoskeletal: He exhibits no edema.  Lymphadenopathy:    He has no cervical adenopathy.  Skin: No rash noted. No erythema.  Psychiatric: He has a normal mood and affect. His behavior is normal.    BP 120/70 (BP Location: Left Arm, Patient Position: Sitting, Cuff Size: Normal)   Pulse 84   Temp 98.6 F (37 C) (Oral)   Resp 18   Wt 160 lb 9.6 oz (72.8 kg)   SpO2 97%   BMI 24.42 kg/m  Wt Readings from Last 3 Encounters:  12/27/17 160 lb 9.6 oz (72.8 kg)  10/06/17 164 lb 12.8 oz (74.8 kg)  09/28/17 164 lb (74.4 kg)     Lab Results  Component Value Date   WBC 6.0 03/10/2017   HGB 12.9 (L) 03/10/2017   HCT 38.6 (L)  03/10/2017   PLT 243.0 03/10/2017   GLUCOSE 100 (H) 03/10/2017   CHOL 191 03/10/2017   TRIG 83.0 03/10/2017   HDL 62.60 03/10/2017   LDLCALC 112 (H) 03/10/2017   ALT 15 04/26/2017   AST 20 04/26/2017   NA 141 03/10/2017   K 4.2 03/10/2017   CL 101 03/10/2017   CREATININE  0.92 03/10/2017   BUN 21 03/10/2017   CO2 32 03/10/2017   TSH 1.07 04/26/2017   PSA 2.19 05/12/2015   INR 1.03 05/29/2009   HGBA1C 5.7 03/10/2017       Assessment & Plan:   Problem List Items Addressed This Visit    Sleep difficulties    Has been on seroquel 300mg  previously.  Had wanted to taper.  Now on 200mg  daily.  Wants to increase back up to 300mg .  Follow.        URI (upper respiratory infection)    Sinusitis/URI with increased cough.  Will treat with doxycycline.  Probiotic as directed.  Prednisone taper as directed.  Continue mucinex.  Saline nasal spray and nasacort nasal spray as directed.  Follow.            Einar Pheasant, MD

## 2017-12-27 NOTE — Patient Instructions (Signed)
Saline nasal spray - flush nose at least 2-3x/day  nasacort nasal spray - 2 sprays each nostril one time per day.  Do this in the evening.    Take the antibiotic twice a day as directed.    Take a probiotic daily while you are on the antibiotic and for two weeks after completing the antibiotic.    Examples of probiotics:  Florastor, culturelle or align  Take the prednisone taper as directed.

## 2017-12-28 ENCOUNTER — Encounter: Payer: Self-pay | Admitting: Internal Medicine

## 2017-12-28 NOTE — Assessment & Plan Note (Signed)
Sinusitis/URI with increased cough.  Will treat with doxycycline.  Probiotic as directed.  Prednisone taper as directed.  Continue mucinex.  Saline nasal spray and nasacort nasal spray as directed.  Follow.

## 2017-12-28 NOTE — Assessment & Plan Note (Signed)
Has been on seroquel 300mg  previously.  Had wanted to taper.  Now on 200mg  daily.  Wants to increase back up to 300mg .  Follow.

## 2018-01-02 ENCOUNTER — Encounter: Payer: Self-pay | Admitting: Internal Medicine

## 2018-01-02 NOTE — Telephone Encounter (Signed)
Patient was seen last week and treated with doxy and prednisone. Do you recommend reevaluation or trying something different?

## 2018-01-08 ENCOUNTER — Encounter: Payer: Self-pay | Admitting: Internal Medicine

## 2018-01-10 ENCOUNTER — Encounter: Payer: Self-pay | Admitting: Family Medicine

## 2018-01-10 ENCOUNTER — Ambulatory Visit (INDEPENDENT_AMBULATORY_CARE_PROVIDER_SITE_OTHER): Payer: Medicare Other | Admitting: Family Medicine

## 2018-01-10 ENCOUNTER — Encounter

## 2018-01-10 VITALS — BP 132/86 | HR 89 | Temp 98.4°F | Wt 159.0 lb

## 2018-01-10 DIAGNOSIS — J320 Chronic maxillary sinusitis: Secondary | ICD-10-CM

## 2018-01-10 MED ORDER — FEXOFENADINE HCL 180 MG PO TABS: 180.0000 mg | ORAL_TABLET | Freq: Every day | ORAL | 2 refills | Status: DC

## 2018-01-10 MED ORDER — SULFAMETHOXAZOLE-TRIMETHOPRIM 800-160 MG PO TABS: 1.0000 | ORAL_TABLET | Freq: Two times a day (BID) | ORAL | 0 refills | Status: AC

## 2018-01-10 NOTE — Progress Notes (Signed)
Subjective:    Patient ID: Mike Farley, male    DOB: 20-Mar-1951, 67 y.o.   MRN: [id number]  HPI  Presents to clinic c/o congestion for 4 weeks. He was treated with course of doxycycline starting 12/27/17 and feels no better after that medication.  Patient has also been using Nasacort nasal spray with minimal relief.  States nasal discharge was green at first, but now is thick and yellow.  It is draining down back of throat causing him to cough and having to constantly clear his throat.  Reports about 10 years ago he had sinus surgery and felt better for a long time after this, but over the past year or so his severe sinus symptoms have started to return.  He does not take oral antihistamine daily.   Patient Active Problem List   Diagnosis Date Noted  . URI (upper respiratory infection) 07/16/2017  . Elevated PSA 02/28/2017  . Hyperglycemia 02/28/2017  . Scalp laceration 11/02/2015  . History of shoulder surgery 11/02/2015  . Chest pain 06/29/2015  . Neck pain 06/29/2015  . Knee pain 06/29/2015  . Sleep difficulties 06/29/2015  . Health care maintenance 06/29/2015   Social History   Tobacco Use  . Smoking status: Former Research scientist (life sciences)  . Smokeless tobacco: Current User    Types: Chew  Substance Use Topics  . Alcohol use: Yes    Alcohol/week: 0.0 standard drinks    Comment: socially   Review of Systems    Constitutional: Negative for chills, fatigue and fever.  HENT: +congestion, ear pain, sinus pain, clearing throat.   Eyes: Negative.   Respiratory: +cough. Negative for shortness of breath and wheezing.   Cardiovascular: Negative for chest pain, palpitations and leg swelling.  Gastrointestinal: Negative for abdominal pain, diarrhea, nausea and vomiting.  Genitourinary: Negative for dysuria, frequency and urgency.  Musculoskeletal: Negative for arthralgias and myalgias.  Skin: Negative for color change, pallor and rash.  Neurological: Negative for syncope, light-headedness and  headaches.  Psychiatric/Behavioral: The patient is not nervous/anxious.    Objective:   Physical Exam  Constitutional: He is oriented to person, place, and time. He appears well-developed and well-nourished. No distress.  HENT:  Head: Normocephalic and atraumatic.  Nose: Right sinus exhibits maxillary sinus tenderness. Left sinus exhibits maxillary sinus tenderness.  +thick nasal discharge and PND  Eyes: Conjunctivae and EOM are normal. Right eye exhibits no discharge. Left eye exhibits no discharge. No scleral icterus.  Neck: Neck supple. No tracheal deviation present.  Cardiovascular: Normal rate and regular rhythm.  Pulmonary/Chest: Effort normal and breath sounds normal. No respiratory distress. He has no wheezes. He has no rales.  Lung sounds clear, suspect cough is related to severe PND  Neurological: He is alert and oriented to person, place, and time.  Skin: Skin is warm and dry. No pallor.  Psychiatric: He has a normal mood and affect. His behavior is normal.  Nursing note and vitals reviewed.  Vitals:   01/10/18 1329  BP: 132/86  Pulse: 89  Temp: 98.4 F (36.9 C)  SpO2: 94%      Assessment & Plan:    Chronic sinusitis --patient will take course of Bactrim.  Bactrim was chosen due to patient recently being on doxycycline and reporting an amoxicillin allergy.  He will begin daily Allegra to help congestion symptoms.  He is advised to continue Nasacort as well for nasal congestion.   Postnasal drip- Allegra and Nasacort should help improve the symptoms.  Also discussed doing regular saline  nasal washes to help with congestion.  ENT referral given due to long-standing history of chronic sinus issues.  Keep regular follow up in November 2019 as scheduled.

## 2018-01-10 NOTE — Patient Instructions (Signed)
Sinus Rinse What is a sinus rinse? A sinus rinse is a simple home treatment that is used to rinse your sinuses with a sterile mixture of salt and water (saline solution). Sinuses are air-filled spaces in your skull behind the bones of your face and forehead that open into your nasal cavity. You will use the following:  Saline solution.  Neti pot or spray bottle. This releases the saline solution into your nose and through your sinuses. Neti pots and spray bottles can be purchased at your local pharmacy, a health food store, or online.  When would I do a sinus rinse? A sinus rinse can help to clear mucus, dirt, dust, or pollen from the nasal cavity. You may do a sinus rinse when you have a cold, a virus, nasal allergy symptoms, a sinus infection, or stuffiness in the nose or sinuses. If you are considering a sinus rinse:  Ask your child's health care provider before performing a sinus rinse on your child.  Do not do a sinus rinse if you have had ear or nasal surgery, ear infection, or blocked ears.  How do I do a sinus rinse?  Wash your hands.  Disinfect your device according to the directions provided and then dry it.  Use the solution that comes with your device or one that is sold separately in stores. Follow the mixing directions on the package.  Fill your device with the amount of saline solution as directed by the device instructions.  Stand over a sink and tilt your head sideways over the sink.  Place the spout of the device in your upper nostril (the one closer to the ceiling).  Gently pour or squeeze the saline solution into the nasal cavity. The liquid should drain to the lower nostril if you are not overly congested.  Gently blow your nose. Blowing too hard may cause ear pain.  Repeat in the other nostril.  Clean and rinse your device with clean water and then air-dry it. Are there risks of a sinus rinse? Sinus rinse is generally very safe and effective. However,  there are a few risks, which include:  A burning sensation in the sinuses. This may happen if you do not make the saline solution as directed. Make sure to follow all directions when making the saline solution.  Infection from contaminated water. This is rare, but possible.  Nasal irritation.  This information is not intended to replace advice given to you by your health care provider. Make sure you discuss any questions you have with your health care provider. Document Released: 12/05/2013 Document Revised: 04/06/2016 Document Reviewed: 09/25/2013 Elsevier Interactive Patient Education  2017 Else[employer] Inc.  

## 2018-01-17 DIAGNOSIS — J329 Chronic sinusitis, unspecified: Secondary | ICD-10-CM | POA: Diagnosis not present

## 2018-01-19 DIAGNOSIS — J320 Chronic maxillary sinusitis: Secondary | ICD-10-CM | POA: Diagnosis not present

## 2018-01-20 ENCOUNTER — Other Ambulatory Visit: Payer: Self-pay | Admitting: Neurosurgery

## 2018-01-20 DIAGNOSIS — M47812 Spondylosis without myelopathy or radiculopathy, cervical region: Secondary | ICD-10-CM

## 2018-01-20 DIAGNOSIS — Z6823 Body mass index (BMI) 23.0-23.9, adult: Secondary | ICD-10-CM | POA: Diagnosis not present

## 2018-01-27 ENCOUNTER — Other Ambulatory Visit: Payer: Medicare Other

## 2018-01-31 ENCOUNTER — Ambulatory Visit: Payer: Medicare Other | Admitting: Internal Medicine

## 2018-02-07 DIAGNOSIS — J320 Chronic maxillary sinusitis: Secondary | ICD-10-CM | POA: Diagnosis not present

## 2018-02-14 ENCOUNTER — Ambulatory Visit
Admission: RE | Admit: 2018-02-14 | Discharge: 2018-02-14 | Disposition: A | Discharge status: Home / Self Care | Payer: Medicare Other | Source: Ambulatory Visit | Attending: Neurosurgery | Admitting: Neurosurgery

## 2018-02-14 ENCOUNTER — Other Ambulatory Visit: Payer: Self-pay | Admitting: Internal Medicine

## 2018-02-14 DIAGNOSIS — M50321 Other cervical disc degeneration at C4-C5 level: Secondary | ICD-10-CM | POA: Insufficient documentation

## 2018-02-14 DIAGNOSIS — M4802 Spinal stenosis, cervical region: Secondary | ICD-10-CM | POA: Diagnosis not present

## 2018-02-14 DIAGNOSIS — I999 Unspecified disorder of circulatory system: Secondary | ICD-10-CM | POA: Diagnosis not present

## 2018-02-14 DIAGNOSIS — M47812 Spondylosis without myelopathy or radiculopathy, cervical region: Secondary | ICD-10-CM

## 2018-02-14 DIAGNOSIS — Z981 Arthrodesis status: Secondary | ICD-10-CM | POA: Insufficient documentation

## 2018-02-15 NOTE — Telephone Encounter (Signed)
Last OV 01/10/18 Next OV 04/10/18 Last refill 12/27/17

## 2018-03-03 DIAGNOSIS — Z6823 Body mass index (BMI) 23.0-23.9, adult: Secondary | ICD-10-CM | POA: Diagnosis not present

## 2018-03-03 DIAGNOSIS — M47812 Spondylosis without myelopathy or radiculopathy, cervical region: Secondary | ICD-10-CM | POA: Diagnosis not present

## 2018-03-06 ENCOUNTER — Other Ambulatory Visit: Payer: Self-pay | Admitting: Neurosurgery

## 2018-03-06 DIAGNOSIS — M47812 Spondylosis without myelopathy or radiculopathy, cervical region: Secondary | ICD-10-CM

## 2018-03-13 ENCOUNTER — Other Ambulatory Visit: Payer: Self-pay | Admitting: Internal Medicine

## 2018-03-13 DIAGNOSIS — Z23 Encounter for immunization: Secondary | ICD-10-CM | POA: Diagnosis not present

## 2018-03-15 ENCOUNTER — Other Ambulatory Visit: Payer: Self-pay | Admitting: Neurosurgery

## 2018-03-15 DIAGNOSIS — M47812 Spondylosis without myelopathy or radiculopathy, cervical region: Secondary | ICD-10-CM

## 2018-03-16 ENCOUNTER — Ambulatory Visit
Admission: RE | Admit: 2018-03-16 | Discharge: 2018-03-16 | Disposition: A | Discharge status: Home / Self Care | Payer: Medicare Other | Source: Ambulatory Visit | Attending: Neurosurgery | Admitting: Neurosurgery

## 2018-03-16 ENCOUNTER — Other Ambulatory Visit: Payer: Self-pay | Admitting: Neurosurgery

## 2018-03-16 ENCOUNTER — Other Ambulatory Visit: Payer: Self-pay | Admitting: Internal Medicine

## 2018-03-16 ENCOUNTER — Other Ambulatory Visit: Payer: Medicare Other

## 2018-03-16 DIAGNOSIS — M47812 Spondylosis without myelopathy or radiculopathy, cervical region: Secondary | ICD-10-CM

## 2018-03-16 MED ORDER — IOPAMIDOL (ISOVUE-M 300) INJECTION 61%: 1.0000 mL | Freq: Once | INTRAMUSCULAR | Status: DC | PRN

## 2018-03-16 MED ORDER — DEXAMETHASONE SODIUM PHOSPHATE 4 MG/ML IJ SOLN: 6.0000 mg | Freq: Once | INTRAMUSCULAR | Status: DC

## 2018-03-16 MED ORDER — DIAZEPAM 5 MG PO TABS
5.0000 mg | ORAL_TABLET | Freq: Once | ORAL | Status: AC
  Administered 2018-03-16: 5 mg via ORAL

## 2018-03-16 NOTE — Discharge Instructions (Signed)

## 2018-03-20 ENCOUNTER — Other Ambulatory Visit: Payer: Medicare Other

## 2018-03-27 DIAGNOSIS — Z6824 Body mass index (BMI) 24.0-24.9, adult: Secondary | ICD-10-CM | POA: Diagnosis not present

## 2018-03-27 DIAGNOSIS — M47812 Spondylosis without myelopathy or radiculopathy, cervical region: Secondary | ICD-10-CM | POA: Diagnosis not present

## 2018-03-30 ENCOUNTER — Encounter: Payer: Self-pay | Admitting: Internal Medicine

## 2018-03-31 NOTE — Telephone Encounter (Signed)
PT HAS BEEN RESCHEDULED

## 2018-04-03 ENCOUNTER — Other Ambulatory Visit (INDEPENDENT_AMBULATORY_CARE_PROVIDER_SITE_OTHER): Payer: Medicare Other

## 2018-04-03 DIAGNOSIS — R739 Hyperglycemia, unspecified: Secondary | ICD-10-CM

## 2018-04-03 DIAGNOSIS — D649 Anemia, unspecified: Secondary | ICD-10-CM | POA: Diagnosis not present

## 2018-04-03 DIAGNOSIS — R972 Elevated prostate specific antigen [PSA]: Secondary | ICD-10-CM | POA: Diagnosis not present

## 2018-04-03 DIAGNOSIS — Z1159 Encounter for screening for other viral diseases: Secondary | ICD-10-CM | POA: Diagnosis not present

## 2018-04-03 DIAGNOSIS — Z13220 Encounter for screening for lipoid disorders: Secondary | ICD-10-CM | POA: Diagnosis not present

## 2018-04-03 LAB — CBC WITH DIFFERENTIAL/PLATELET
BASOS ABS: 0 10*3/uL (ref 0.0–0.1)
Basophils Relative: 0.3 % (ref 0.0–3.0)
EOS ABS: 0.2 10*3/uL (ref 0.0–0.7)
Eosinophils Relative: 3.9 % (ref 0.0–5.0)
HCT: 36.8 % — ABNORMAL LOW (ref 39.0–52.0)
Hemoglobin: 12.6 g/dL — ABNORMAL LOW (ref 13.0–17.0)
LYMPHS ABS: 1.7 10*3/uL (ref 0.7–4.0)
LYMPHS PCT: 35.9 % (ref 12.0–46.0)
MCHC: 34.4 g/dL (ref 30.0–36.0)
MCV: 91.2 fl (ref 78.0–100.0)
MONO ABS: 0.4 10*3/uL (ref 0.1–1.0)
Monocytes Relative: 9.2 % (ref 3.0–12.0)
NEUTROS ABS: 2.5 10*3/uL (ref 1.4–7.7)
Neutrophils Relative %: 50.7 % (ref 43.0–77.0)
PLATELETS: 188 10*3/uL (ref 150.0–400.0)
RBC: 4.03 Mil/uL — ABNORMAL LOW (ref 4.22–5.81)
RDW: 13 % (ref 11.5–15.5)
WBC: 4.9 10*3/uL (ref 4.0–10.5)

## 2018-04-03 LAB — BASIC METABOLIC PANEL
BUN: 19 mg/dL (ref 6–23)
CHLORIDE: 102 meq/L (ref 96–112)
CO2: 30 mEq/L (ref 19–32)
CREATININE: 0.92 mg/dL (ref 0.40–1.50)
Calcium: 9.4 mg/dL (ref 8.4–10.5)
GFR: 87 mL/min (ref >60.00)
Glucose, Bld: 105 mg/dL — ABNORMAL HIGH (ref 70–99)
Potassium: 4 mEq/L (ref 3.5–5.1)
Sodium: 140 mEq/L (ref 135–145)

## 2018-04-03 LAB — PSA: PSA: 2.7 ng/mL (ref 0.10–4.00)

## 2018-04-03 LAB — LIPID PANEL
CHOL/HDL RATIO: 2
Cholesterol: 163 mg/dL (ref 0–200)
HDL: 71.3 mg/dL (ref >39.00)
LDL CALC: 74 mg/dL (ref 0–99)
NonHDL: 91.84
TRIGLYCERIDES: 91 mg/dL (ref 0.0–149.0)
VLDL: 18.2 mg/dL (ref 0.0–40.0)

## 2018-04-03 LAB — HEPATIC FUNCTION PANEL
ALK PHOS: 95 U/L (ref 39–117)
ALT: 21 U/L (ref 0–53)
AST: 25 U/L (ref 0–37)
Albumin: 4.4 g/dL (ref 3.5–5.2)
BILIRUBIN DIRECT: 0.1 mg/dL (ref 0.0–0.3)
BILIRUBIN TOTAL: 0.4 mg/dL (ref 0.2–1.2)
Total Protein: 6.6 g/dL (ref 6.0–8.3)

## 2018-04-03 LAB — TSH: TSH: 4.14 u[IU]/mL (ref 0.35–4.50)

## 2018-04-03 LAB — HEMOGLOBIN A1C: Hgb A1c MFr Bld: 5.5 % (ref 4.6–6.5)

## 2018-04-03 LAB — FERRITIN: FERRITIN: 78.2 ng/mL (ref 22.0–322.0)

## 2018-04-04 ENCOUNTER — Encounter: Payer: Self-pay | Admitting: Internal Medicine

## 2018-04-04 LAB — HEPATITIS C ANTIBODY
HEP C AB: NONREACTIVE
SIGNAL TO CUT-OFF: 0.02 (ref <1.00)

## 2018-04-05 ENCOUNTER — Ambulatory Visit (INDEPENDENT_AMBULATORY_CARE_PROVIDER_SITE_OTHER): Payer: Medicare Other | Admitting: Internal Medicine

## 2018-04-05 ENCOUNTER — Encounter: Payer: Self-pay | Admitting: Internal Medicine

## 2018-04-05 ENCOUNTER — Telehealth: Payer: Self-pay

## 2018-04-05 VITALS — BP 120/74 | HR 74 | Temp 98.2°F | Resp 18 | Wt 169.4 lb

## 2018-04-05 DIAGNOSIS — G479 Sleep disorder, unspecified: Secondary | ICD-10-CM

## 2018-04-05 DIAGNOSIS — Z01818 Encounter for other preprocedural examination: Secondary | ICD-10-CM | POA: Diagnosis not present

## 2018-04-05 DIAGNOSIS — R739 Hyperglycemia, unspecified: Secondary | ICD-10-CM | POA: Diagnosis not present

## 2018-04-05 DIAGNOSIS — M542 Cervicalgia: Secondary | ICD-10-CM

## 2018-04-05 DIAGNOSIS — D649 Anemia, unspecified: Secondary | ICD-10-CM | POA: Diagnosis not present

## 2018-04-05 DIAGNOSIS — R972 Elevated prostate specific antigen [PSA]: Secondary | ICD-10-CM

## 2018-04-05 LAB — CBC WITH DIFFERENTIAL/PLATELET
BASOS PCT: 0.2 % (ref 0.0–3.0)
Basophils Absolute: 0 10*3/uL (ref 0.0–0.1)
EOS PCT: 2.6 % (ref 0.0–5.0)
Eosinophils Absolute: 0.2 10*3/uL (ref 0.0–0.7)
HEMATOCRIT: 38.2 % — AB (ref 39.0–52.0)
Hemoglobin: 12.9 g/dL — ABNORMAL LOW (ref 13.0–17.0)
LYMPHS PCT: 26.7 % (ref 12.0–46.0)
Lymphs Abs: 1.7 10*3/uL (ref 0.7–4.0)
MCHC: 33.9 g/dL (ref 30.0–36.0)
MCV: 92.1 fl (ref 78.0–100.0)
Monocytes Absolute: 0.6 10*3/uL (ref 0.1–1.0)
Monocytes Relative: 8.9 % (ref 3.0–12.0)
NEUTROS ABS: 3.8 10*3/uL (ref 1.4–7.7)
Neutrophils Relative %: 61.6 % (ref 43.0–77.0)
PLATELETS: 210 10*3/uL (ref 150.0–400.0)
RBC: 4.14 Mil/uL — ABNORMAL LOW (ref 4.22–5.81)
RDW: 13.3 % (ref 11.5–15.5)
WBC: 6.2 10*3/uL (ref 4.0–10.5)

## 2018-04-05 LAB — IBC PANEL
Iron: 83 ug/dL (ref 42–165)
Saturation Ratios: 23.3 % (ref 20.0–50.0)
Transferrin: 254 mg/dL (ref 212.0–360.0)

## 2018-04-05 LAB — VITAMIN B12: Vitamin B-12: 451 pg/mL (ref 211–911)

## 2018-04-05 NOTE — Progress Notes (Signed)
Patient ID: Mike Farley, male   DOB: Sep 01, 1950, 67 y.o.   MRN: [id number]   Subjective:    Patient ID: Mike Farley, male    DOB: 1951/03/27, 67 y.o.   MRN: [id number]  HPI  Patient here for a scheduled follow up.  Planning for surgery 04/10/18.  Planning posterior fusion - C4-5.  Increased neck pain.  Previous injection helped some.  States otherwise he is doing well.  Tries to stay active.  No chest pain.  No sob.  No acid reflux.  No abdominal pain.  Bowels moving.  Discussed labs.  Slightly decreased hgb.  Discussed f/u check today and will check iron and B12.     Past Medical History:  Diagnosis Date  . Cervical pain (neck)    limited mobility  . Chronic pain   . Depression    years ago (minor)   Past Surgical History:  Procedure Laterality Date  . ANTERIOR FUSION CERVICAL SPINE  2011   4-5 levels  . APPENDECTOMY    . BONE EXCISION Left 09/28/2017   Procedure: BONE EXCISION-TAILORS  EXOSTECTOMY;  Surgeon: Samara Deist, DPM;  Location: [facility];  Service: Podiatry;  Laterality: Left;  IVA LOCAL  . CERVICAL DISCECTOMY  2008   posterior  . CHOLECYSTECTOMY    . ELBOW FRACTURE SURGERY Left 2004  . SHOULDER SURGERY Right   . TONSILLECTOMY AND ADENOIDECTOMY    . TOTAL KNEE ARTHROPLASTY Right 2005   x 4   Family History  Problem Relation Age of Onset  . Arthritis Mother   . Cancer Mother        colon & breast cnacer  . Mental illness Mother        Depression & bipolar  . Cancer Father        lung & prostate cancer  . Alcohol abuse Brother   . Kidney disease Brother    Social History   Socioeconomic History  . Marital status: Married    Spouse name: Not on file  . Number of children: Not on file  . Years of education: Not on file  . Highest education level: Not on file  Occupational History  . Not on file  Social Needs  . Financial resource strain: Not hard at all  . Food insecurity:    Worry: Never true    Inability: Never true  .  Transportation needs:    Medical: No    Non-medical: No  Tobacco Use  . Smoking status: Former Research scientist (life sciences)  . Smokeless tobacco: Current User    Types: Chew  Substance and Sexual Activity  . Alcohol use: Yes    Alcohol/week: 0.0 standard drinks    Comment: socially  . Drug use: Not on file  . Sexual activity: Not on file  Lifestyle  . Physical activity:    Days per week: Not on file    Minutes per session: Not on file  . Stress: Not on file  Relationships  . Social connections:    Talks on phone: Not on file    Gets together: Not on file    Attends religious service: Not on file    Active member of club or organization: Not on file    Attends meetings of clubs or organizations: Not on file    Relationship status: Not on file  Other Topics Concern  . Not on file  Social History Narrative  . Not on file    Outpatient Encounter Medications as of 04/05/2018  Medication Sig  . Multiple Vitamin (MULTIVITAMIN) tablet Take 1 tablet by mouth daily.  . Oxycodone HCl 20 MG TABS   . Pseudoephedrine HCl (NASAL DECONGESTANT PO) Take by mouth.  . QUEtiapine (SEROQUEL) 300 MG tablet TAKE ONE TABLET BY MOUTH AT BEDTIME  . rosuvastatin (CRESTOR) 10 MG tablet TAKE ONE TABLET EVERY DAY  . tiZANidine (ZANAFLEX) 4 MG tablet   . valACYclovir (VALTREX) 500 MG tablet TAKE ONE TABLET TWICE DAILY  . [DISCONTINUED] doxycycline (VIBRA-TABS) 100 MG tablet Take 1 tablet (100 mg total) by mouth 2 (two) times daily. (Patient not taking: Reported on 01/10/2018)  . [DISCONTINUED] fexofenadine (ALLEGRA) 180 MG tablet Take 1 tablet (180 mg total) by mouth daily.  . [DISCONTINUED] ibuprofen (ADVIL,MOTRIN) 200 MG tablet Take 600 mg by mouth as needed.   . [DISCONTINUED] meloxicam (MOBIC) 15 MG tablet   . [DISCONTINUED] naproxen sodium (ALEVE) 220 MG tablet Take 220 mg by mouth as needed.   . [DISCONTINUED] predniSONE (DELTASONE) 10 MG tablet Take 6 tablets x 1 day and then decrease by 1/2 tablet per day until down  to zero mg. (Patient not taking: Reported on 01/10/2018)  . [DISCONTINUED] Triamcinolone Acetonide (NASACORT ALLERGY 24HR NA) Place 1 spray into the nose at bedtime as needed.   No facility-administered encounter medications on file as of 04/05/2018.     Review of Systems  Constitutional: Negative for appetite change and unexpected weight change.  HENT: Negative for congestion and sinus pressure.   Respiratory: Negative for cough, chest tightness and shortness of breath.   Cardiovascular: Negative for chest pain, palpitations and leg swelling.  Gastrointestinal: Negative for abdominal pain, diarrhea, nausea and vomiting.  Genitourinary: Negative for difficulty urinating and dysuria.  Musculoskeletal: Positive for neck pain. Negative for joint swelling.  Skin: Negative for color change and rash.  Neurological: Negative for dizziness and headaches.  Psychiatric/Behavioral: Negative for agitation and dysphoric mood.       Objective:     Blood pressure rechecked by me:  130/72  Physical Exam  Constitutional: He appears well-developed and well-nourished. No distress.  HENT:  Nose: Nose normal.  Mouth/Throat: Oropharynx is clear and moist.  Neck: Neck supple.  Cardiovascular: Normal rate and regular rhythm.  Pulmonary/Chest: Effort normal and breath sounds normal. No respiratory distress.  Abdominal: Soft. Bowel sounds are normal. There is no tenderness.  Musculoskeletal: He exhibits no edema or tenderness.  Lymphadenopathy:    He has no cervical adenopathy.  Skin: No rash noted. No erythema.  Psychiatric: He has a normal mood and affect. His behavior is normal.    BP 120/74 (BP Location: Left Arm, Patient Position: Sitting, Cuff Size: Normal)   Pulse 74   Temp 98.2 F (36.8 C) (Oral)   Resp 18   Wt 169 lb 6.4 oz (76.8 kg)   SpO2 98%   BMI 25.76 kg/m  Wt Readings from Last 3 Encounters:  04/05/18 169 lb 6.4 oz (76.8 kg)  01/10/18 159 lb (72.1 kg)  12/27/17 160 lb 9.6 oz  (72.8 kg)     Lab Results  Component Value Date   WBC 6.2 04/05/2018   HGB 12.9 (L) 04/05/2018   HCT 38.2 (L) 04/05/2018   PLT 210.0 04/05/2018   GLUCOSE 105 (H) 04/03/2018   CHOL 163 04/03/2018   TRIG 91.0 04/03/2018   HDL 71.30 04/03/2018   LDLCALC 74 04/03/2018   ALT 21 04/03/2018   AST 25 04/03/2018   NA 140 04/03/2018   K 4.0 04/03/2018   CL  102 04/03/2018   CREATININE 0.92 04/03/2018   BUN 19 04/03/2018   CO2 30 04/03/2018   TSH 4.14 04/03/2018   PSA 2.70 04/03/2018   INR 1.03 05/29/2009   HGBA1C 5.5 04/03/2018    Dg Facet Jt Inj C/t Single Level Right W/fl/ct  Result Date: 03/16/2018 CLINICAL DATA:  Facet mediated cervicalgia. Adjacent segment disease. EXAM: BILATERAL C4-C5 CERVICAL FACET INJECTION FLUOROSCOPY TIME:  1 minutes and 24 seconds corresponding to a Dose Area Product of 109.81 Gy*m2 COMPARISON:  CT cervical spine 02/14/2018. PROCEDURE: Informed written consent was obtained.  Time-out was performed. An appropriate skin entry site was chosen, cleansed with Betadine, and anesthetized with 1% lidocaine. Due to severe bony overgrowth from the superior articular process of C5, the usual inferior to superior approach to the facet joint is not possible in this patient. Instead, a superior to inferior trajectory was used to gain access to the periarticular capsule posteriorly at both levels. A posterior approach was taken to the facet on the RIGHT and LEFT at C4-C5 using a 3.5 inch 25 gauge spinal needle. Periarticular positioning was confirmed by injecting a drop of Isovue-M 300. No vascular opacification is seen. In each joint, 3 mg of Decadron mixed with a few drops of 1% Lidocaine were instilled into the joint. The procedure was poorly tolerated. Despite the fact that he received preprocedure Valium, experienced a panic attack during the injection. This was managed with reassurance and expeditious completion of the injection. The patient was discharged twenty minutes  following the injection in good condition. IMPRESSION: Technically successful BILATERAL C4-C5 facet injection. The patient is not a good candidate for subsequent percutaneous steroid intervention, as he suffers from severe anxiety, not allayed by Valium, and experienced a panic attack during today's procedure. Electronically Signed   By: Staci Righter M.D.   On: 03/16/2018 09:06   Dg Facet Jt Inj L /s Single Level Left W/fl/ct  Result Date: 03/16/2018 CLINICAL DATA:  Facet mediated cervicalgia. Adjacent segment disease. EXAM: BILATERAL C4-C5 CERVICAL FACET INJECTION FLUOROSCOPY TIME:  1 minutes and 24 seconds corresponding to a Dose Area Product of 109.81 Gy*m2 COMPARISON:  CT cervical spine 02/14/2018. PROCEDURE: Informed written consent was obtained.  Time-out was performed. An appropriate skin entry site was chosen, cleansed with Betadine, and anesthetized with 1% lidocaine. Due to severe bony overgrowth from the superior articular process of C5, the usual inferior to superior approach to the facet joint is not possible in this patient. Instead, a superior to inferior trajectory was used to gain access to the periarticular capsule posteriorly at both levels. A posterior approach was taken to the facet on the RIGHT and LEFT at C4-C5 using a 3.5 inch 25 gauge spinal needle. Periarticular positioning was confirmed by injecting a drop of Isovue-M 300. No vascular opacification is seen. In each joint, 3 mg of Decadron mixed with a few drops of 1% Lidocaine were instilled into the joint. The procedure was poorly tolerated. Despite the fact that he received preprocedure Valium, experienced a panic attack during the injection. This was managed with reassurance and expeditious completion of the injection. The patient was discharged twenty minutes following the injection in good condition. IMPRESSION: Technically successful BILATERAL C4-C5 facet injection. The patient is not a good candidate for subsequent  percutaneous steroid intervention, as he suffers from severe anxiety, not allayed by Valium, and experienced a panic attack during today's procedure. Electronically Signed   By: Staci Righter M.D.   On: 03/16/2018 09:06  Assessment & Plan:   Problem List Items Addressed This Visit    Elevated PSA    S/p biopsy.  Has been evaluated by urology.  PSA 04/03/18 - 2.7.        Hyperglycemia    Low carb diet and exercise.  Follow met b and a1c.  a1c 04/03/18 - 5.5.        Neck pain    Is s/p previous neck surgeries.  Just evaluated by Dr Glenna Fellows.  Planning for posterior fusion C4-5.        Pre-op evaluation - Primary    No chest pain.  No sob.  EKG - SR with no acute ischemic changes.  I feel from a cardiac standpoint that pt is at a low risk to proceed with the planned surgery.  Will need close intra op and post op monitoring of his heart rate and blood pressure to avoid extremes.        Relevant Orders   EKG 12-Lead (Completed)   Sleep difficulties    Tried to decrease seroquel.  Did not tolerate decrease.  Doing well on current regimen.  Follow.         Other Visit Diagnoses    Anemia, unspecified type       Relevant Orders   CBC with Differential/Platelet (Completed)   Vitamin B12 (Completed)   IBC panel (Completed)       Einar Pheasant, MD

## 2018-04-05 NOTE — Telephone Encounter (Signed)
Copied from [place] [id number]. Topic: General - Other >> Apr 05, 2018  2:00 PM Leward Quan A wrote: Reason for CRM:   Patient called to leave this fax number for EKG to be faxed over to  Dr Glenna Fellows office. Patient is having surgery on 04/10/18  att.[facility] Fax# [phone]   **I have faxed to [facility]**

## 2018-04-06 ENCOUNTER — Encounter: Payer: Self-pay | Admitting: Internal Medicine

## 2018-04-06 DIAGNOSIS — M4322 Fusion of spine, cervical region: Secondary | ICD-10-CM | POA: Diagnosis not present

## 2018-04-08 ENCOUNTER — Encounter: Payer: Self-pay | Admitting: Internal Medicine

## 2018-04-08 DIAGNOSIS — Z01818 Encounter for other preprocedural examination: Secondary | ICD-10-CM | POA: Insufficient documentation

## 2018-04-08 NOTE — Assessment & Plan Note (Signed)
S/p biopsy.  Has been evaluated by urology.  PSA 04/03/18 - 2.7.

## 2018-04-08 NOTE — Assessment & Plan Note (Signed)
Tried to decrease seroquel.  Did not tolerate decrease.  Doing well on current regimen.  Follow.

## 2018-04-08 NOTE — Assessment & Plan Note (Signed)
Low carb diet and exercise.  Follow met b and a1c.  a1c 04/03/18 - 5.5.

## 2018-04-08 NOTE — Assessment & Plan Note (Signed)
No chest pain.  No sob.  EKG - SR with no acute ischemic changes.  I feel from a cardiac standpoint that pt is at a low risk to proceed with the planned surgery.  Will need close intra op and post op monitoring of his heart rate and blood pressure to avoid extremes.

## 2018-04-08 NOTE — Assessment & Plan Note (Signed)
Is s/p previous neck surgeries.  Just evaluated by Dr Glenna Fellows.  Planning for posterior fusion C4-5.

## 2018-04-10 ENCOUNTER — Ambulatory Visit: Payer: Medicare Other | Admitting: Internal Medicine

## 2018-04-10 DIAGNOSIS — E785 Hyperlipidemia, unspecified: Secondary | ICD-10-CM | POA: Diagnosis not present

## 2018-04-10 DIAGNOSIS — Z981 Arthrodesis status: Secondary | ICD-10-CM | POA: Diagnosis not present

## 2018-04-10 DIAGNOSIS — M47892 Other spondylosis, cervical region: Secondary | ICD-10-CM | POA: Diagnosis not present

## 2018-04-10 DIAGNOSIS — M47812 Spondylosis without myelopathy or radiculopathy, cervical region: Secondary | ICD-10-CM | POA: Diagnosis not present

## 2018-04-11 DIAGNOSIS — E785 Hyperlipidemia, unspecified: Secondary | ICD-10-CM | POA: Diagnosis not present

## 2018-04-14 ENCOUNTER — Other Ambulatory Visit: Payer: Self-pay | Admitting: Internal Medicine

## 2018-04-18 DIAGNOSIS — Z981 Arthrodesis status: Secondary | ICD-10-CM | POA: Diagnosis not present

## 2018-04-18 DIAGNOSIS — M47812 Spondylosis without myelopathy or radiculopathy, cervical region: Secondary | ICD-10-CM | POA: Diagnosis not present

## 2018-04-18 DIAGNOSIS — M4322 Fusion of spine, cervical region: Secondary | ICD-10-CM | POA: Diagnosis not present

## 2018-04-19 DIAGNOSIS — M47812 Spondylosis without myelopathy or radiculopathy, cervical region: Secondary | ICD-10-CM | POA: Insufficient documentation

## 2018-04-19 DIAGNOSIS — M47816 Spondylosis without myelopathy or radiculopathy, lumbar region: Secondary | ICD-10-CM | POA: Insufficient documentation

## 2018-05-12 ENCOUNTER — Other Ambulatory Visit: Payer: Self-pay | Admitting: Internal Medicine

## 2018-05-13 ENCOUNTER — Other Ambulatory Visit: Payer: Self-pay | Admitting: Internal Medicine

## 2018-05-14 ENCOUNTER — Encounter: Payer: Self-pay | Admitting: Internal Medicine

## 2018-05-15 ENCOUNTER — Encounter: Payer: Self-pay | Admitting: Internal Medicine

## 2018-05-15 MED ORDER — VALACYCLOVIR HCL 500 MG PO TABS: 500.0000 mg | ORAL_TABLET | Freq: Two times a day (BID) | ORAL | 0 refills | Status: DC

## 2018-05-16 ENCOUNTER — Other Ambulatory Visit: Payer: Self-pay

## 2018-05-16 MED ORDER — VALACYCLOVIR HCL 500 MG PO TABS: 500.0000 mg | ORAL_TABLET | Freq: Two times a day (BID) | ORAL | 0 refills | Status: DC

## 2018-05-19 ENCOUNTER — Telehealth: Payer: Self-pay

## 2018-05-19 ENCOUNTER — Other Ambulatory Visit: Payer: Self-pay | Admitting: *Deleted

## 2018-05-19 MED ORDER — VALACYCLOVIR HCL 500 MG PO TABS: 500.0000 mg | ORAL_TABLET | Freq: Two times a day (BID) | ORAL | 0 refills | Status: DC

## 2018-05-19 NOTE — Telephone Encounter (Signed)
Patient called to check on appointment status from referral.

## 2018-05-19 NOTE — Telephone Encounter (Signed)
Attempted to call patient.  No answer.  Please call and discuss with patient.

## 2018-05-24 HISTORY — PX: COLONOSCOPY: SHX174

## 2018-05-28 ENCOUNTER — Encounter: Payer: Self-pay | Admitting: Internal Medicine

## 2018-05-29 ENCOUNTER — Other Ambulatory Visit: Payer: Self-pay

## 2018-05-29 NOTE — Progress Notes (Signed)
Last OV 04/05/2018 Next OV 07/20/18 Last refill 04/15/2018  Patient is going out of town on the 8th so he needs an early refill

## 2018-05-29 NOTE — Progress Notes (Signed)
Need more info.  Need to know how long he is going to be gone.  He just got medication refilled 05/15/18.

## 2018-05-29 NOTE — Telephone Encounter (Signed)
Scheduled patient with Ander Purpura for tomorrow afternoon since pt is going out of town on Wednesday

## 2018-05-30 ENCOUNTER — Telehealth: Payer: Self-pay | Admitting: *Deleted

## 2018-05-30 ENCOUNTER — Ambulatory Visit (INDEPENDENT_AMBULATORY_CARE_PROVIDER_SITE_OTHER): Payer: Medicare Other | Admitting: Family Medicine

## 2018-05-30 ENCOUNTER — Other Ambulatory Visit: Payer: Self-pay | Admitting: Internal Medicine

## 2018-05-30 ENCOUNTER — Encounter: Payer: Self-pay | Admitting: Family Medicine

## 2018-05-30 VITALS — BP 124/80 | HR 93 | Temp 99.0°F | Wt 168.4 lb

## 2018-05-30 DIAGNOSIS — R1013 Epigastric pain: Secondary | ICD-10-CM | POA: Diagnosis not present

## 2018-05-30 DIAGNOSIS — R110 Nausea: Secondary | ICD-10-CM

## 2018-05-30 LAB — COMPREHENSIVE METABOLIC PANEL
ALT: 13 U/L (ref 0–53)
AST: 20 U/L (ref 0–37)
Albumin: 4.4 g/dL (ref 3.5–5.2)
Alkaline Phosphatase: 106 U/L (ref 39–117)
BUN: 20 mg/dL (ref 6–23)
CHLORIDE: 99 meq/L (ref 96–112)
CO2: 31 meq/L (ref 19–32)
Calcium: 9.9 mg/dL (ref 8.4–10.5)
Creatinine, Ser: 1.1 mg/dL (ref 0.40–1.50)
GFR: 70.75 mL/min (ref >60.00)
GLUCOSE: 120 mg/dL — AB (ref 70–99)
POTASSIUM: 4.1 meq/L (ref 3.5–5.1)
SODIUM: 137 meq/L (ref 135–145)
Total Bilirubin: 0.3 mg/dL (ref 0.2–1.2)
Total Protein: 6.9 g/dL (ref 6.0–8.3)

## 2018-05-30 LAB — CBC
HEMATOCRIT: 38.2 % — AB (ref 39.0–52.0)
HEMOGLOBIN: 12.7 g/dL — AB (ref 13.0–17.0)
MCHC: 33.2 g/dL (ref 30.0–36.0)
MCV: 89.2 fl (ref 78.0–100.0)
Platelets: 246 10*3/uL (ref 150.0–400.0)
RBC: 4.28 Mil/uL (ref 4.22–5.81)
RDW: 13.3 % (ref 11.5–15.5)
WBC: 8.4 10*3/uL (ref 4.0–10.5)

## 2018-05-30 MED ORDER — ONDANSETRON 4 MG PO TBDP: 4.0000 mg | ORAL_TABLET | Freq: Three times a day (TID) | ORAL | 0 refills | Status: DC | PRN

## 2018-05-30 MED ORDER — OMEPRAZOLE 20 MG PO CPDR: 20.0000 mg | DELAYED_RELEASE_CAPSULE | Freq: Every day | ORAL | 3 refills | Status: DC

## 2018-05-30 NOTE — Progress Notes (Signed)
Called pt  Unable to leave message

## 2018-05-30 NOTE — Progress Notes (Signed)
Subjective:    Patient ID: Mike Farley, male    DOB: [birth year], 68 y.o.   MRN: [id number]  HPI  Patient presents to clinic complaining of nausea feeling for the past 9 to 10 days.  Patient denies any vomiting, diarrhea or urinary issues.  States his stomach just feels off, does not have a great appetite due to this feeling.  Forces himself to eat small amounts of food, but fills up quickly due to nauseous feeling.  Denies fever or chills.  Patient has tried taking over-the-counter Pepto-Bismol as well as some Dramamine, but these have not helped symptoms improve at all.  Denies any feelings of heartburn up into the chest.  Recent lab work reviewed and is unremarkable for any abnormality: CBC Latest Ref Rng & Units 04/05/2018 04/03/2018 03/10/2017  WBC 4.0 - 10.5 K/uL 6.2 4.9 6.0  Hemoglobin 13.0 - 17.0 g/dL 12.9(L) 12.6(L) 12.9(L)  Hematocrit 39.0 - 52.0 % 38.2(L) 36.8(L) 38.6(L)  Platelets 150.0 - 400.0 K/uL 210.0 188.0 243.0   BMP Latest Ref Rng & Units 04/03/2018 03/10/2017 07/18/2015  Glucose 70 - 99 mg/dL 105(H) 100(H) 109(H)  BUN 6 - 23 mg/dL _0 Creatinine 0.40 - 1.50 mg/dL 0.92 0.92 0.92  Sodium 135 - 145 mEq/L 140 141 140  Potassium 3.5 - 5.1 mEq/L 4.0 4.2 3.8  Chloride 96 - 112 mEq/L 102 101 102  CO2 19 - 32 mEq/L 30 32 31  Calcium 8.4 - 10.5 mg/dL 9.4 9.6 9.5   Hepatic Function Latest Ref Rng & Units 04/03/2018 04/26/2017 03/10/2017  Total Protein 6.0 - 8.3 g/dL 6.6 7.3 6.9  Albumin 3.5 - 5.2 g/dL 4.4 4.9 4.4  AST 0 - 37 U/L _1 ALT 0 - 53 U/L _2 Alk Phosphatase 39 - 117 U/L 95 83 91  Total Bilirubin 0.2 - 1.2 mg/dL 0.4 0.5 0.6  Bilirubin, Direct 0.0 - 0.3 mg/dL 0.1 0.1 -    Patient Active Problem List   Diagnosis Date Noted  . Pre-op evaluation 04/08/2018  . URI (upper respiratory infection) 07/16/2017  . Elevated PSA 02/28/2017  . Hyperglycemia 02/28/2017  . Scalp laceration 11/02/2015  . History of shoulder surgery 11/02/2015  .  Chest pain 06/29/2015  . Neck pain 06/29/2015  . Knee pain 06/29/2015  . Sleep difficulties 06/29/2015  . Health care maintenance 06/29/2015   Social History   Tobacco Use  . Smoking status: Former Research scientist (life sciences)  . Smokeless tobacco: Current User    Types: Chew  Substance Use Topics  . Alcohol use: Yes    Alcohol/week: 0.0 standard drinks    Comment: socially   Review of Systems   Constitutional: Negative for chills, fatigue and fever.  HENT: Negative for congestion, ear pain, sinus pain and sore throat.   Eyes: Negative.   Respiratory: Negative for cough, shortness of breath and wheezing.   Cardiovascular: Negative for chest pain, palpitations and leg swelling.  Gastrointestinal: +nausea. Negative for abdominal pain, diarrhea, and vomiting.  Genitourinary: Negative for dysuria, frequency and urgency.  Musculoskeletal: Negative for arthralgias and myalgias.  Skin: Negative for color change, pallor and rash.  Neurological: Negative for syncope, light-headedness and headaches.  Psychiatric/Behavioral: The patient is not nervous/anxious.    Objective:   Physical Exam  Constitutional: He is oriented to person, place, and time. He appears well-developed and well-nourished. No distress.  HENT:  Head: Normocephalic and atraumatic.  Eyes: Pupils are equal, round, and reactive to light. Conjunctivae and  EOM are normal. No scleral icterus.  Neck: Normal range of motion. Neck supple. No tracheal deviation present.  Cardiovascular: Normal rate, regular rhythm and normal heart sounds.  Pulmonary/Chest: Effort normal and breath sounds normal. No respiratory distress. He has no wheezes. He has no rales.   Neurological: He is alert and oriented to person, place, and time. Gait normal  Skin: Skin is warm and dry. He is not diaphoretic. No pallor.  Psychiatric: He has a normal mood and affect. His behavior is normal.   Nursing note and vitals reviewed.  Vitals:   05/30/18 1424  BP: 124/80    Pulse: 93  Temp: 99 F (37.2 C)  SpO2: 97%   Wt Readings from Last 3 Encounters:  05/30/18 168 lb 6.4 oz (76.4 kg)  04/05/18 169 lb 6.4 oz (76.8 kg)  01/10/18 159 lb (72.1 kg)      Assessment & Plan:   Nausea, possible dyspepsia - we will recheck CBC and CMP to compare to lab work done in November to see if there is a difference that could possibly explain symptoms of nausea.  Patient will trial taking omeprazole 20 mg once per day to see if this helps of symptoms to improve.  Patient also given Zofran to take as needed, advised to trial taking this 30 minutes before meal to see if this helps calm his nausea and also he can eat.  If nausea continues to persist, and lab work/medications do not help [place] to determine cause, neck step in plan of care would be to consider some sort of imaging either a abdominal ultrasound or abdominal CT scan.  Patient will keep regularly scheduled follow-up with PCP as planned.  He will return to clinic sooner if issues arise or if current symptoms persist or worsen.

## 2018-05-30 NOTE — Patient Instructions (Signed)
Nausea, Adult Nausea is feeling sick to your stomach or feeling that you are about to throw up (vomit). Feeling sick to your stomach is usually not serious, but it may be an early sign of a more serious medical problem. As you feel sicker to your stomach, you may throw up. If you throw up, or if you are not able to drink enough fluids, there is a risk that you may lose too much water in your body (get dehydrated). If you lose too much water in your body, you may:  Feel tired.  Feel thirsty.  Have a dry mouth.  Have cracked lips.  Go pee (urinate) less often. Older adults and people who have other diseases or a weak body defense system (immune system) have a higher risk of losing too much water in the body. The main goals of treating this condition are:  To relieve your nausea.  To ensure your nausea occurs less often.  To prevent throwing up and losing too much fluid. Follow these instructions at home: Watch your symptoms for any changes. Tell your doctor about them. Follow these instructions as told by your doctor. Eating and drinking      Take an ORS (oral rehydration solution). This is a drink that is sold at pharmacies and stores.  Drink clear fluids in small amounts as you are able. These include: ? Water. ? Ice chips. ? Fruit juice that has water added (diluted fruit juice). ? Low-calorie sports drinks.  Eat bland, easy-to-digest foods in small amounts as you are able, such as: ? Bananas. ? Applesauce. ? Rice. ? Low-fat (lean) meats. ? Toast. ? Crackers.  Avoid drinking fluids that have a lot of sugar or caffeine in them. This includes energy drinks, sports drinks, and soda.  Avoid alcohol.  Avoid spicy or fatty foods. General instructions  Take over-the-counter and prescription medicines only as told by your doctor.  Rest at home while you get better.  Drink enough fluid to keep your pee (urine) pale yellow.  Take slow and deep breaths when you feel  sick to your stomach.  Avoid food or things that have strong smells.  Wash your hands often with soap and water. If you cannot use soap and water, use hand sanitizer.  Make sure that all people in your home wash their hands well and often.  Keep all follow-up visits as told by your doctor. This is important. Contact a doctor if:  You feel sicker to your stomach.  You feel sick to your stomach for more than 2 days.  You throw up.  You are not able to drink fluids without throwing up.  You have new symptoms.  You have a fever.  You have a headache.  You have muscle cramps.  You have a rash.  You have pain while peeing.  You feel light-headed or dizzy. Get help right away if:  You have pain in your chest, neck, arm, or jaw.  You feel very weak or you pass out (faint).  You have throw up that is bright red or looks like coffee grounds.  You have bloody or black poop (stools) or poop that looks like tar.  You have a very bad headache, a stiff neck, or both.  You have very bad pain, cramping, or bloating in your belly (abdomen).  You have trouble breathing or you are breathing very quickly.  Your heart is beating very quickly.  Your skin feels cold and clammy.  You feel confused.    You have signs of losing too much water in your body, such as: ? Dark pee, very little pee, or no pee. ? Cracked lips. ? Dry mouth. ? Sunken eyes. ? Sleepiness. ? Weakness. These symptoms may be an emergency. Do not wait to see if the symptoms will go away. Get medical help right away. Call your local emergency services (911 in the [place] Do not drive yourself to the hospital. Summary  Nausea is feeling sick to your stomach or feeling that you are about to throw up (vomit).  If you throw up, or if you are not able to drink enough fluids, there is a risk that you may lose too much water in your body (get dehydrated).  Eat and drink what your doctor tells you. Take  over-the-counter and prescription medicines only as told by your doctor.  Contact a doctor right away if your symptoms get worse or you have new symptoms.  Keep all follow-up visits as told by your doctor. This is important. This information is not intended to replace advice given to you by your health care provider. Make sure you discuss any questions you have with your health care provider. Document Released: 04/29/2011 Document Revised: 10/18/2017 Document Reviewed: 10/18/2017 Elsevier Interactive Patient Education  2019 [place] is a feeling of pain, discomfort, burning, or fullness in the upper part of your belly (abdomen). It can come and go. It may occur often or rarely. Indigestion tends to happen while you are eating or right after you have finished eating. Indigestion may be a symptom of another condition. It may be worse:  At night.  When bending over.  While lying down. Follow these instructions at home: Eating and drinking   Follow an eating plan as told by your doctor.  You may need to avoid foods and drinks such as: ? Chocolate and cocoa. ? Peppermint and mint flavorings. ? Garlic and onions. ? Horseradish. ? Spicy and acidic foods, such as:  Peppers.  Chili powder and curry powder.  Vinegar.  Hot sauces and BBQ sauce. ? Citrus fruits, such as:  Oranges.  Lemons.  Limes. ? Tomato-based foods, such as:  Red sauce and pizza with red sauce.  Chili.  Salsa. ? Fried and fatty foods, such as:  Donuts.  [place] fries and potato chips.  High-fat dressings. ? High-fat meats, such as:  Hot dogs and sausage.  Rib eye steak.  Ham and bacon. ? High-fat dairy items, such as:  Whole milk.  Butter.  Cream cheese. ? Coffee and tea (with or without caffeine). ? Drinks that contain alcohol. ? Energy drinks and sports drinks. ? Carbonated drinks or sodas. ? Citrus fruit juices.  Eat small meals often. Avoid eating  large meals.  Avoid drinking large amounts of liquid with your meals.  Avoid eating meals during the 2-3 hours before bedtime.  Avoid lying down right after you eat.  Avoid exercise for 2 hours after you eat. Lifestyle      Maintain a healthy weight. Ask your doctor what weight is healthy for you. If you need to lose weight, work with your doctor.  Exercise for at least 30 minutes on 5 or more days each week, or as told by your doctor. ? Avoid exercises that include bending forward. This can make your symptoms worse.  Wear loose clothes. Do not wear anything tight around your waist.  Do not use any products that contain nicotine or tobacco, including cigarettes, e-cigarettes, and chewing tobacco. These  can make your symptoms worse. If you need help quitting, ask your doctor.  Raise (elevate) the head of your bed about 6 inches (15 cm) when you sleep.  Try to lower your stress. If you need help doing this, ask your doctor. General instructions  Take over-the-counter and prescription medicines only as told by your doctor. ? Do not take aspirin, ibuprofen, or other NSAIDs unless your doctor says it is okay.  Pay attention to any changes in your symptoms.  Keep all follow-up visits as told by your doctor. This is important. Contact a doctor if:  You have new symptoms.  You lose weight and you do not know why it is happening.  You have trouble swallowing, or it hurts to swallow.  Your symptoms do not get better with treatment.  Your symptoms last for more than 2 days.  You have a fever.  You throw up (vomit). Get help right away if:  You have pain in your arms, neck, jaw, teeth, or back.  You feel sweaty, dizzy, or light-headed.  You pass out (faint).  You have chest pain or shortness of breath.  You cannot stop throwing up, or you throw up blood.  Your poop (stool) is bloody or black.  You have very bad pain in your belly. These symptoms may represent a  serious problem that is an emergency. Do not wait to see if the symptoms will go away. Get medical help right away. Call your local emergency services (911 in the [place] Do not drive yourself to the hospital. Summary  Indigestion is a feeling of pain, discomfort, burning, or fullness in the upper part of your belly. It tends to happen while you are eating or right after you have finished eating.  Follow an eating plan and other lifestyle changes as told by your doctor.  Take over-the-counter and prescription medicines only as told by your doctor. Do not take aspirin, ibuprofen, or other NSAIDs unless your doctor says it is okay.  Contact your doctor if your symptoms do not get better or they get worse.  Some symptoms may represent a serious problem that is an emergency. Do not wait to see if the symptoms will go away. Get medical help right away. This information is not intended to replace advice given to you by your health care provider. Make sure you discuss any questions you have with your health care provider. Document Released: 06/12/2010 Document Revised: 10/10/2017 Document Reviewed: 10/10/2017 Elsevier Interactive Patient Education  2019 [employer].

## 2018-05-30 NOTE — Telephone Encounter (Signed)
Copied from [place] [id number]. Topic: General - Other >> May 30, 2018  3:21 PM Yvette Rack wrote: Reason for CRM: pt calling stating that the provider was sending over his QUEtiapine (SEROQUEL) 300 MG tablet he will be leaving out of town between tomorrow and Friday

## 2018-05-30 NOTE — Telephone Encounter (Signed)
Duplicate message see other phone note

## 2018-05-30 NOTE — Progress Notes (Signed)
Patient says he did not pick up a refill in December. He is going to call pharmacy and see if he has refill at pharmacy

## 2018-05-31 MED ORDER — QUETIAPINE FUMARATE 300 MG PO TABS: 300.0000 mg | ORAL_TABLET | Freq: Every day | ORAL | 1 refills | Status: DC

## 2018-05-31 NOTE — Progress Notes (Signed)
rx sent in for seroquel since due for refill and pharmacy confirmed due.

## 2018-05-31 NOTE — Progress Notes (Signed)
Left message letting pt know. 

## 2018-05-31 NOTE — Progress Notes (Signed)
Pharmacy has sent over a refill request for this rx, he did fill his seroquel on 05/08/18 but not on 05/15/18 and he still has some left. He is not sure when they are coming back from vacation. That is why he was wanting to get an early refill. They have not left to go out of town yet.

## 2018-06-01 ENCOUNTER — Encounter: Payer: Self-pay | Admitting: Internal Medicine

## 2018-06-01 NOTE — Telephone Encounter (Signed)
He said he would like to see Dr. Vira Agar but whoever can see him first is okay with him

## 2018-06-01 NOTE — Telephone Encounter (Signed)
I am ok with placing an order for GI, but per Lauren's note, she had mentioned possible imaging.  If persistent symptoms, may need to be reevaluated.   I will place order for GI referral.  Does he have a preference of which GI MD he wants to see.

## 2018-06-02 ENCOUNTER — Encounter: Payer: Self-pay | Admitting: Internal Medicine

## 2018-06-02 NOTE — Telephone Encounter (Signed)
Pt states that [facility] (Dr. Vira Agar) is not able to see him until 06/13/2018. Pt is wanting to get in ASAP with any GI specialist as he feels "deathly ill" for 12 days now. Please advise of referral/status.

## 2018-06-05 ENCOUNTER — Encounter: Payer: Self-pay | Admitting: Internal Medicine

## 2018-06-05 ENCOUNTER — Other Ambulatory Visit: Payer: Self-pay | Admitting: Unknown Physician Specialty

## 2018-06-05 DIAGNOSIS — R634 Abnormal weight loss: Secondary | ICD-10-CM

## 2018-06-05 DIAGNOSIS — R1084 Generalized abdominal pain: Secondary | ICD-10-CM | POA: Diagnosis not present

## 2018-06-05 DIAGNOSIS — R110 Nausea: Secondary | ICD-10-CM | POA: Diagnosis not present

## 2018-06-05 NOTE — Telephone Encounter (Signed)
Ok to send office visit and labs.  Thanks.

## 2018-06-05 NOTE — Telephone Encounter (Signed)
Pts wife states that her husband is seeing Dr. Tiffany Kocher today and is at his appt now and would like to see if this info can be faxed to them? She is unaware of the fax number but the location is [place]. Please advise.

## 2018-06-05 NOTE — Telephone Encounter (Signed)
Info faxed to [facility]

## 2018-06-05 NOTE — Telephone Encounter (Signed)
Will they need office note from visit with Lauren as well or just labs?

## 2018-06-06 ENCOUNTER — Ambulatory Visit: Payer: Medicare Other | Admitting: Nurse Practitioner

## 2018-06-06 ENCOUNTER — Encounter: Payer: Self-pay | Admitting: Internal Medicine

## 2018-06-06 DIAGNOSIS — K228 Other specified diseases of esophagus: Secondary | ICD-10-CM | POA: Diagnosis not present

## 2018-06-06 DIAGNOSIS — R110 Nausea: Secondary | ICD-10-CM | POA: Diagnosis not present

## 2018-06-06 DIAGNOSIS — R1013 Epigastric pain: Secondary | ICD-10-CM | POA: Diagnosis not present

## 2018-06-06 DIAGNOSIS — K219 Gastro-esophageal reflux disease without esophagitis: Secondary | ICD-10-CM | POA: Diagnosis not present

## 2018-06-06 DIAGNOSIS — K2950 Unspecified chronic gastritis without bleeding: Secondary | ICD-10-CM | POA: Diagnosis not present

## 2018-06-06 DIAGNOSIS — K3189 Other diseases of stomach and duodenum: Secondary | ICD-10-CM | POA: Diagnosis not present

## 2018-06-06 DIAGNOSIS — K2970 Gastritis, unspecified, without bleeding: Secondary | ICD-10-CM | POA: Diagnosis not present

## 2018-06-06 DIAGNOSIS — K208 Other esophagitis: Secondary | ICD-10-CM | POA: Diagnosis not present

## 2018-06-07 ENCOUNTER — Ambulatory Visit
Admission: RE | Admit: 2018-06-07 | Discharge: 2018-06-07 | Disposition: A | Discharge status: Home / Self Care | Payer: Medicare Other | Source: Ambulatory Visit | Attending: Unknown Physician Specialty | Admitting: Unknown Physician Specialty

## 2018-06-07 DIAGNOSIS — R634 Abnormal weight loss: Secondary | ICD-10-CM | POA: Insufficient documentation

## 2018-06-07 DIAGNOSIS — K5710 Diverticulosis of small intestine without perforation or abscess without bleeding: Secondary | ICD-10-CM | POA: Diagnosis not present

## 2018-06-07 DIAGNOSIS — K420 Umbilical hernia with obstruction, without gangrene: Secondary | ICD-10-CM | POA: Diagnosis not present

## 2018-06-07 MED ORDER — IOHEXOL 300 MG/ML  SOLN
100.0000 mL | Freq: Once | INTRAMUSCULAR | Status: AC | PRN
  Administered 2018-06-07: 100 mL via INTRAVENOUS

## 2018-06-10 ENCOUNTER — Encounter: Payer: Self-pay | Admitting: Internal Medicine

## 2018-06-13 ENCOUNTER — Other Ambulatory Visit: Payer: Self-pay | Admitting: Internal Medicine

## 2018-06-20 ENCOUNTER — Other Ambulatory Visit: Payer: Self-pay

## 2018-06-20 ENCOUNTER — Encounter: Payer: Self-pay | Admitting: Nurse Practitioner

## 2018-06-20 ENCOUNTER — Ambulatory Visit: Payer: Medicare Other | Attending: Nurse Practitioner | Admitting: Nurse Practitioner

## 2018-06-20 VITALS — BP 138/89 | HR 76 | Temp 98.0°F | Ht 69.0 in | Wt 158.0 lb

## 2018-06-20 DIAGNOSIS — M899 Disorder of bone, unspecified: Secondary | ICD-10-CM

## 2018-06-20 DIAGNOSIS — M5481 Occipital neuralgia: Secondary | ICD-10-CM | POA: Diagnosis not present

## 2018-06-20 DIAGNOSIS — Z79891 Long term (current) use of opiate analgesic: Secondary | ICD-10-CM

## 2018-06-20 DIAGNOSIS — G894 Chronic pain syndrome: Secondary | ICD-10-CM

## 2018-06-20 DIAGNOSIS — G8929 Other chronic pain: Secondary | ICD-10-CM

## 2018-06-20 DIAGNOSIS — Z789 Other specified health status: Secondary | ICD-10-CM

## 2018-06-20 DIAGNOSIS — Z79899 Other long term (current) drug therapy: Secondary | ICD-10-CM | POA: Diagnosis not present

## 2018-06-20 DIAGNOSIS — M542 Cervicalgia: Secondary | ICD-10-CM | POA: Insufficient documentation

## 2018-06-20 NOTE — Patient Instructions (Signed)

## 2018-06-20 NOTE — Progress Notes (Signed)
Patient's Name: Mike Farley  MRN: [id number]  Referring Provider: Glenna Fellows, MD  DOB: 11-Oct-1950  PCP: Einar Pheasant, MD  DOS: 06/20/2018  Note by: Dionisio Worth NP  Service setting: Ambulatory outpatient  Specialty: Interventional Pain Management  Location: [facility] (AMB) Pain Management Facility    Patient type: New Patient    Primary Reason(s) for Visit: Initial Patient Evaluation CC: Neck Pain  HPI  Mike Farley is a 68 y.o. year old, male patient, who comes today for an initial evaluation. He has Neck pain; Knee pain; Sleep difficulties; Health care maintenance; Status post rotator cuff repair; Elevated PSA; Hyperglycemia; Pre-op evaluation; Arthritis of right acromioclavicular joint; Biceps tendinopathy, right; Cardiac murmur; Cervical spondylosis without myelopathy; Chronic, continuous use of opioids; Complete tear of right rotator cuff; Rotator cuff tear arthropathy, right; Primary localized osteoarthrosis of right shoulder; Shoulder pain; Subacromial impingement, right; Chronic pain syndrome; Long term current use of opiate analgesic; Pharmacologic therapy; Disorder of skeletal system; Problems influencing health status; Chronic neck pain (Primary Area of Pain); and Occipital neuralgia of right side (Secondary Area of Pain) on their problem list.. His primarily concern today is the Neck Pain  Pain Assessment: Location: Upper Neck Radiating: radiaties down to med neck Onset: More than a month ago Duration: Chronic pain Quality: Stabbing, Throbbing, Aching Severity: 6 /10 (subjective, self-reported pain score)  Note: Reported level is compatible with observation. Clinically the patient looks like a 1/10 A 1/10 is viewed as "Mild" and described as nagging, annoying, but not interfering with basic activities of daily living (ADL). Mike Farley is able to eat, bathe, get dressed, do toileting (being able to get on and off the toilet and perform personal hygiene functions), transfer (move  in and out of bed or a chair without assistance), and maintain continence (able to control bladder and bowel functions). Physiologic parameters such as blood pressure and heart rate apear wnl. Information on the proper use of the pain scale provided to the patient today. When using our objective Pain Scale, levels between 6 and 10/10 are said to belong in an emergency room, as it progressively worsens from a 6/10, described as severely limiting, requiring emergency care not usually available at an outpatient pain management facility. At a 6/10 level, communication becomes difficult and requires great effort. Assistance to reach the emergency department may be required. Facial flushing and profuse sweating along with potentially dangerous increases in heart rate and blood pressure will be evident. Effect on ADL: limits my daily activities Timing: Constant Modifying factors: medications, rest BP: 138/89  HR: 76  Onset and Duration: Gradual and Present longer than 3 months Cause of pain: Unknown Severity: Getting worse, NAS-11 at its worse: 9/10, NAS-11 at its best: 5/10, NAS-11 now: 6/10 and NAS-11 on the average: not listed/10 Timing: Afternoon, During activity or exercise and After activity or exercise Aggravating Factors: Intercourse (sex) and Motion Alleviating Factors: Medications, Resting and Sleeping Associated Problems: none listed  Quality of Pain: Aching, Agonizing, Distressing, Sharp, Stabbing, Tender and Throbbing Previous Examinations or Tests: CT scan, MRI scan, X-rays, Neurological evaluation and Neurosurgical evaluation Previous Treatments: Narcotic medications  The patient comes into the clinics today for the first time for a chronic pain management evaluation.  According to the patient his primary area of pain is in his neck.  He has undergone several cervical fusions last being November 2019 C4-5 by Dr. Carloyn Manner.  He feels like he is doing well with the surgery.  He is having pain in  the  back of the head.  He denies headaches.  He denies any radiating pain.  He denies any interventional therapy.  He is done home physical therapy.  He has had recent CT cervical spine.  Today I took the time to provide the patient with information regarding this pain practice. The patient was informed that the practice is divided into two sections: an interventional pain management section, as well as a completely separate and distinct medication management section. I explained that there are procedure days for interventional therapies, and evaluation days for follow-ups and medication management. Because of the amount of documentation required during both, they are kept separated. This means that there is the possibility that he may be scheduled for a procedure on one day, and medication management the next. I have also informed him that because of staffing and facility limitations, this practice will no longer take patients for medication management only. To illustrate the reasons for this, I gave the patient the example of surgeons, and how inappropriate it would be to refer a patient to his/her care, just to write for the post-surgical antibiotics on a surgery done by a different surgeon.   Because interventional pain management is part of the board-certified specialty for the doctors, the patient was informed that joining this practice means that they are open to any and all interventional therapies. I made it clear that this does not mean that they will be forced to have any procedures done. What this means is that I believe interventional therapies to be essential part of the diagnosis and proper management of chronic pain conditions. Therefore, patients not interested in these interventional alternatives will be better served under the care of a different practitioner.  The patient was also made aware of my Comprehensive Pain Management Safety Guidelines where by joining this practice, they limit all  of their nerve blocks and joint injections to those done by our practice, for as long as we are retained to manage their care. Historic Controlled Substance Pharmacotherapy Review  PMP and historical list of controlled substances: Oxycodone 20 mg, lorazepam 1 mg, testament 1% gel, Highest opioid analgesic regimen found: Oxycodone 20 mg 3 tablets every 4 hours ( last fill date 05/02/2018) oxycodone 160 mg/day Most recent opioid analgesic: Oxycodone 20 mg 3 tablets every 4 hours ( last fill date 05/02/2018) oxycodone 160 mg/day Current opioid analgesics: Oxycodone 20 mg 3 tablets every 4 hours ( last fill date 05/02/2018) oxycodone 160 mg/day Highest recorded MME/day: 540 mg/day MME/day: 540 mg/day Medications: The patient did not bring the medication(s) to the appointment, as requested in our "New Patient Package" Pharmacodynamics: Desired effects: Analgesia: The patient reports >50% benefit. Reported improvement in function: The patient reports medication allows him to accomplish basic ADLs. Clinically meaningful improvement in function (CMIF): Sustained CMIF goals met Perceived effectiveness: Described as relatively effective, allowing for increase in activities of daily living (ADL) Undesirable effects: Side-effects or Adverse reactions: None reported Historical Monitoring: The patient  has no history on file for drug. List of all UDS Test(s): No results found for: MDMA, COCAINSCRNUR, PCPSCRNUR, PCPQUANT, CANNABQUANT, THCU, [place] List of all Serum Drug Screening Test(s):  No results found for: AMPHSCRSER, BARBSCRSER, BENZOSCRSER, COCAINSCRSER, PCPSCRSER, PCPQUANT, THCSCRSER, CANNABQUANT, OPIATESCRSER, OXYSCRSER, PROPOXSCRSER Historical Background Evaluation: [place] PDMP: Six (6) year initial data search conducted.             [place] Department of public safety, offender search: Editor, commissioning Information) Non-contributory Risk Assessment Profile: Aberrant behavior: None observed or detected today Risk  factors for fatal opioid overdose: None identified today Fatal overdose hazard ratio (HR): Calculation deferred Non-fatal overdose hazard ratio (HR): Calculation deferred Risk of opioid abuse or dependence: 0.7-3.0% with doses ? 36 MME/day and 6.1-26% with doses ? 120 MME/day. Substance use disorder (SUD) risk level: Pending results of Medical Psychology Evaluation for SUD Opioid risk tool (ORT) (Total Score): 0  ORT Scoring interpretation table:  Score <3 = Low Risk for SUD  Score between 4-7 = Moderate Risk for SUD  Score >8 = High Risk for Opioid Abuse   PHQ-2 Depression Scale:  Total score:    PHQ-2 Scoring interpretation table: (Score and probability of major depressive disorder)  Score 0 = No depression  Score 1 = 15.4% Probability  Score 2 = 21.1% Probability  Score 3 = 38.4% Probability  Score 4 = 45.5% Probability  Score 5 = 56.4% Probability  Score 6 = 78.6% Probability   PHQ-9 Depression Scale:  Total score:    PHQ-9 Scoring interpretation table:  Score 0-4 = No depression  Score 5-9 = Mild depression  Score 10-14 = Moderate depression  Score 15-19 = Moderately severe depression  Score 20-27 = Severe depression (2.4 times higher risk of SUD and 2.89 times higher risk of overuse)   Pharmacologic Plan: Pending ordered tests and/or consults  Meds  The patient has a current medication list which includes the following prescription(s): multivitamin, omeprazole, ondansetron, ondansetron, oxycodone hcl, pseudoephedrine hcl, quetiapine, rosuvastatin, sucralfate, and valacyclovir.  Current Outpatient Medications on File Prior to Visit  Medication Sig  . Multiple Vitamin (MULTIVITAMIN) tablet Take 1 tablet by mouth daily.  Marland Kitchen omeprazole (PRILOSEC) 20 MG capsule Take 1 capsule (20 mg total) by mouth daily.  . ondansetron (ZOFRAN ODT) 4 MG disintegrating tablet Take 1 tablet (4 mg total) by mouth every 8 (eight) hours as needed for nausea or vomiting.  . ondansetron (ZOFRAN) 4  MG tablet Take 4 mg by mouth every 8 (eight) hours as needed for nausea or vomiting.  . Oxycodone HCl 20 MG TABS Take 20 mg by mouth 3 (three) times daily. Per patient  . Pseudoephedrine HCl (NASAL DECONGESTANT PO) Take by mouth.  . QUEtiapine (SEROQUEL) 300 MG tablet Take 1 tablet (300 mg total) by mouth at bedtime.  . rosuvastatin (CRESTOR) 10 MG tablet TAKE ONE TABLET BY MOUTH EVERY DAY  . sucralfate (CARAFATE) 1 g tablet Take 1 g by mouth 4 (four) times daily -  with meals and at bedtime.  . valACYclovir (VALTREX) 500 MG tablet Take 1 tablet (500 mg total) by mouth 2 (two) times daily.   No current facility-administered medications on file prior to visit.    Imaging Review  Cervical Imaging:  Cervical MR w/wo contrast:  Results for orders placed during the hospital encounter of 04/24/09  MR Cervical Spine W Wo Contrast   Narrative Clinical Data: Worsening right arm and scapular pain.  Previous fusion.   MRI CERVICAL SPINE WITHOUT AND WITH CONTRAST   Technique:  Multiplanar and multiecho pulse sequences of the cervical spine, to include the craniocervical junction and cervicothoracic junction, were obtained according to standard protocol without and with intravenous contrast.   Contrast: 14 ml Multihance   Comparison: 03/11/2089 and multiple previous   Findings: There is no abnormality at the foramen magnum.  There are mild degenerative changes at the C1-2 articulation but no slippage or narrowing of the spinal canal.   At C2-3, there is facet degeneration worse on the left than the right with  anterolisthesis of 1 mm.  The disc bulges mildly.  The central canal sufficiently patent.  There is mild foraminal encroachment, not grossly compressive.   At C3-4, there is spondylosis with endplate osteophytes covering bulging disc material.  The ventral subarachnoid space is effaced and the cord is indented slightly.  There is foraminal stenosis bilaterally because of osteophytic  encroachment that could effect either C4 nerve root.   At C4-5, there is spondylosis with endplate osteophytes and bulging disc material.  The ventral subarachnoid space is effaced but the cord is not appear deformed.  There is facet degeneration on the right.  There is foraminal stenosis, right more than left.  Either C5 nerve root could be affected, more likely the right.   There is been previous anterior cervical discectomy and fusion from C5-C7.  Fusion appears solid.  The foramina at C5-6 appear widely patent.  There is foraminal stenosis on the right at C6-7 that could possibly affect the C7 nerve root.   At C7-T1, there is facet degeneration with 3 mm of anterolisthesis. The disc bulges mildly.  There is no compressive effect upon the cord.  There is mild foraminal narrowing bilaterally without definite neural compression.   At T1-2, there is facet degeneration with 2 mm of anterolisthesis. There is shallow protrusion of disc material more towards the right.  This is not appear to affect the cord.  There is mild foraminal narrowing on the right.   There is an old healed compression fracture of T3.   Compared to the previous exam, with findings appear quite similar.   IMPRESSION: C1-2:  Degenerative changes that could be a cause of neck pain.  No slippage or encroachment upon the neural spaces.   C2-3:  Spondylosis and left-sided facet arthropathy.  No apparent neural compression.   C3-4:  Spondylosis with canal narrowing.  Effacement subarachnoid space and slight indentation upon the ventral aspect of the cord. Neural foraminal stenosis bilaterally.   C4-5:  Spondylosis with effacement ventral subarachnoid space but no cord deformity.  Neural foraminal stenosis bilaterally.   Solid fusion from C5-C7.  Foraminal stenosis on the right as C6-7 could effect the C7 nerve root.   C7-T1:  Facet arthropathy with 3 mm anterolisthesis.  Mild foraminal narrowing without  definite neural compression.   T1-2:  Facet arthropathy with 2 mm of anterolisthesis.  Shallow right paracentral disc herniation.  Mild foraminal narrowing bilaterally.  No gross neural compression.   I cannot see any definite change since the previous examination.  Provider: Weyman Rodney  Cervical CT wo contrast:  Results for orders placed during the hospital encounter of 02/14/18  CT CERVICAL SPINE WO CONTRAST   Narrative CLINICAL DATA:  68 year old male status post prior cervical spine surgeries. Left shoulder pain and right hand numbness.  EXAM: CT CERVICAL SPINE WITHOUT CONTRAST  TECHNIQUE: Multidetector CT imaging of the cervical spine was performed without intravenous contrast. Multiplanar CT image reconstructions were also generated.  COMPARISON:  Cervical spine CT 12/28/2011 and earlier.  FINDINGS: Alignment: Increase straightening of cervical lordosis since 2013. Cervicothoracic junction alignment remains normal. Mild chronic anterolisthesis of C2 on C3. C1-C2 alignment remains normal.  Skull base and vertebrae: Visualized skull base is intact. No acute osseous abnormality identified. Degenerative and postoperative findings detailed below.  Soft tissues and spinal canal: Negative visible noncontrast brain parenchyma. Negative noncontrast neck soft tissues aside from mild calcified carotid bifurcation atherosclerosis.  Disc levels:  Occiput-C1: Chronic but progressed and severe right occipital condyle-C1 joint  degeneration. Vacuum phenomena now (coronal series 7, image 34) superimposed on increased chronic osteophytosis and subchondral sclerosis.  C1-C2: Chronic but increased right far lateral joint space loss with subchondral sclerosis (series 7, image 26).  C2-C3: Interbody and posterior element ankylosis has developed since 2013 and is solid.  C3-C4: Solid interbody and posterior element ankylosis has developed since 2013. Chronic bulky endplate  spurring, most pronounced anteriorly. Chronic spinal and foraminal stenosis at this level appears not significantly changed.  C4-C5: Severe disc space loss with bulky circumferential disc osteophyte complex and moderate to severe bilateral facet hypertrophy. Interbody and facet vacuum phenomena. Moderate to severe bilateral C5 foraminal stenosis. Mild spinal stenosis suspected at this level, similar to that at C3-C4.  C5-C6:  Chronic solid ankylosis or arthrodesis.  Stable since 2013.  C6-C7:  Chronic solid arthrodesis.  Stable since 2013.  C7-T1: Progressed and solid interbody and posterior element arthrodesis with stable bilateral posterior laminar fusion hardware in place.  T1-T2: Moderate facet hypertrophy and anterior eccentric disc osteophyte complex. Probable mild spinal stenosis and moderate bilateral T1 foraminal stenosis.  Upper chest: Intact visible upper thoracic levels. Negative lung apices. Negative noncontrast thoracic inlet.  Other: Visualized paranasal sinuses and mastoids are well pneumatized.  IMPRESSION: 1. Progressed cervical spinal ankylosis/arthrodesis since 2013. There is now solid ankylosis except at the C1-C2 and C4-C5 levels. 2. Severe C4-C5 disc and facet degeneration with suspected mild spinal stenosis and moderate to severe bilateral C5 foraminal stenosis. 3. Progressed and up to severe joint degeneration at the right occiput-C1 and C1-C2 joint spaces. 4. 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   Electronically Signed   By: Genevie Ann M.D.   On: 02/14/2018 14:07     Cervical CT w/wo contrast:  Results for orders placed during the hospital encounter of 07/28/09  CT Cervical Spine W Wo Contrast   Narrative Clinical Data: Severe neck pain and right arm pain.  Follow-up fusion.  C3-C4 degenerative disc disease.   CT CERVICAL SPINE WITHOUT AND WITH CONTRAST   Technique:  Multidetector CT imaging of the  cervical spine was performed without and with contrast. Multiplanar CT image reconstructions were also generated   Contrast: 100 ml Omnipaque-300.   Comparison: Intraoperative radiograph 05/30/2009.  Cervical spine MRI 04/24/2009.   Findings: Completed fusion is present at the vertebral bodies of C5- C7.  There has been posterior instrumentation at C7-T1.  3 mm of anterolisthesis of C7 on T1 remains unchanged.  Posterolateral morcellated bone graft.  Rod and screw fixation is present at C7- T1.  The right pedicle screw extends into the inferior articular process of C7, approaching the facet joint surface.  The left C7 screw extends into the facet joint space.  The T1 screw on the right is positioned within the pars interarticularis.  Believe left posterior screw protrudes into the superior aspect of the left T1- T2 neural foramen.  There is no evidence of hardware failure or loosening.  C3-C4 cervical spondylosis is present with 1 mm retrolisthesis.  Disc osteophyte complexes are present at C3-C4 and C4-C5.  2 mm anterolisthesis of C3 on C4.  Atlantodental degenerative disease.  Craniocervical alignment normal.   After contrast administration, there is no abnormal enhancement in the operative bed or fluid collection identified.  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  Bilateral pleural apical thickening is noted in the lungs.   C2-C3:  Left foraminal stenosis associated with severe facet hypertrophy.  Mild right foraminal stenosis due to uncovertebral spurring.   C3-C4:  Broad-based disc osteophyte complex with moderate central stenosis.  Left greater than right foraminal stenosis from uncovertebral spurring.   C4-C5:  Symmetric bilateral foraminal stenosis associated with uncovertebral spurring and facet arthrosis.   C7-T1:  Mild to moderate bony central stenosis.  Foramina appear adequately patent.    IMPRESSION:   1.  C7-T1 posterior rod and pedicle screw fixation, with hardware as described above.  No evidence of hardware failure.  Unchanged retrolisthesis of C7 on T1.  Posterolateral morcellated bone graft. 2.  Upper cervical spondylosis as described above. 3.  No abnormal enhancing fluid collections in the operative bed.  Provider: Coralee Pesa   Cervical CT w contrast:  Results for orders placed during the hospital encounter of 09/21/07  CT Cervical Spine W Contrast   Narrative Clinical Data:  68 year old male status post anterior decompression of the cervical spine at C3-4 and C4-5 with recurrent pain in the left shoulder.  Dysphagia.   MYELOGRAM INJECTION   Technique:  Informed consent was obtained from the patient prior to the procedure, including potential complications of headache, allergy, infection and pain.  A timeout procedure was performed. With the patient prone, the lower back was prepped with Betadine. 1% Lidocaine was used for local anesthesia.  Lumbar puncture was performed at the left paramidline L3-4level using a 22 gauge needle with return of clear CSF.  10cc of Omnipaque 300was injected into the subarachnoid space .   IMPRESSION: Successful injection of  intrathecal contrast for myelography.   MYELOGRAM CERVICAL AND THORACIC   Technique: ]Following injection of intrathecal Omnipaque contrast, spine imaging in multiple projections was performed using fluoroscopy.   Comparison: MRI cervical spine 09/05/2007   Findings: Conventional myelographic images demonstrate a near total block of contrast at the level of T2-3.  Very little contrast is seen above this level.  The thoracic spine is unremarkable below this level.  Anterior cervical fusion hardware is present at C6-7 lateral view does shows some contrast layering along the ventral surface.  There is an extradural defect at C3-4 which appears to be a disc osteophyte complex.  Details at  cervicothoracic junction are limited.   Fluoroscopy Time: 2.07 minutes   IMPRESSION:   1.  Near total block of contrast at the T2 level.  Please see the CT report below.   CT MYELOGRAPHY CERVICAL SPINE   Technique:  CT imaging of the cervical spine was performed after intrathecal contrast administration. Multiplanar CT image reconstructions were also generated.   Findings:  There is anterior fusion hardware at the C6-7 level. The patient has previous mature bony fusion at C5-6.  There is significant lucency surrounding both screws, particularly the left screw at C6.  There is circumferential lucency about the bone graft material at this C6-7 level.  There is lucency along the superior surface of the C7 screws.  There is significant soft tissue posterior to diffuse level.  This measures 6 mm at the disc level. The canal is narrowed to 6 mm at this level as well.  Slight anterolisthesis at C7-T1 is evident.  The soft tissue behind C6-7 is significantly increased from prior MRI scan.   C2-3: Mild left foraminal narrowing is due to advanced facet hypertrophy and mild uncovertebral spurring.   C3-4: The diffuse disc osteophyte complex is present.  This results in mild moderate central canal stenosis biforaminal narrowing is worse left than right.  The central canal is narrowed to just  under 10 mm.   C4-5: A diffuse disc osteophyte complex is present.  This effaces the ventral CSF without clearly contacting the cord.  Moderate right and mild left foraminal narrowing is due to uncovertebral spurring and asymmetric right-sided facet hypertrophy.   C5-6: This is the fused level.  An osteophyte and left paramidline position effaces the CSF without clearly contacting the cord. There is no significant stenosis.   C6-7: Please see the above discussion regarding the lucency surrounding the screws, bone graft material, and posterior epidural soft tissues.  The right-sided screw at C6  appears to be mostly backed out of the bone and is backed out of the fusion plate by 2 mm.  The osseous foraminal narrowing is worse right than left.   C7-T1: The ventral soft tissue is still seen at the C7 level. Slight anterolisthesis measures 3 mm   There is no significant inferior extension of the ventral soft tissue beyond the C7-T1 level.   IMPRESSION:   1.  Extensive lucency surrounding the hardware at C6 with backing out of the right-sided screw concerning for infection. 2.  Lucency surrounding the bone graft material C6-7 level.  This is also concerning for infection. 3.  Significant increase in ventral epidural soft tissue mass narrowing the central canal at the C6-7 level to 6 mm.  This may representing epidural abscess. 4.  Previous mature fusion at C5-6. 5.  Multilevel spondylosis as described above.   CT MYELOGRAPHY THORACIC SPINE   Technique: CT imaging of the thoracic spine was performed after intrathecal contrast administration.  Multiplanar CT image reconstructions were also generated.   Findings:  The thoracic spine is imaged from C6-L1.  Contrast is somewhat dilute in the upper thoracic spine due to pooling posteriorly with the patient's kyphosis.  On the cervical spine CT, contrast at the T1 level was within normal limits.  There is a remote compression fracture with kyphotic angulation at to T3. Endplate Schmorl's nodes are evident at T5-6, T6-7 and T7-8. Additional Schmorl's nodes are seen throughout the lower thoracic spine.  A large hemangioma is evident at T12, measuring 18 mm maximally.  Mild disc bulges are present at T10-11 and also at T11- 12.  There is partial effacement ventral CSF without significant stenosis at either level.  Minimal disc bulging is present T7-8. The foramina appear patent bilaterally.   IMPRESSION:   1.  No significant inferior extension of the cervical epidural disease. 2.  Remote compression fracture of T3. 3.   Multiple Schmorl's nodes as described. 4.  Minimal disc bulges at T11-12 and T10-11 without significant stenosis at either level.  Provider: Mary Siler   Cervical CT outside: No results found for this or any previous visit. Cervical DG 1 view:  Results for orders placed during the hospital encounter of 09/22/07  DG Cervical Spine 1 View   Narrative Clinical Data: Removal of cervical hardware.   CERVICAL SPINE - 1 VIEW   Comparison: 09/22/2007   Findings: Metallic hardware within C6 and C7 has been removed. There is anatomic alignment through C6.  C7 is partially obscured. Degenerative changes from C3-C5 are present with large anterior osteophytes.   IMPRESSION: Removal of C6 and C7 metallic hardware.  Provider: Illa Level   Cervical DG 2-3 views:  Results for orders placed during the hospital encounter of 02/14/18  DG Cervical Spine 2 or 3 views   Narrative CLINICAL DATA:  Cervical spondylosis.  EXAM: CERVICAL SPINE - 2-3 VIEW  COMPARISON:  CT 12/28/2011.  FINDINGS: Diffuse multilevel degenerative change with prior multilevel bony fusion and posterior fusion again noted at C7-T1. Hardware intact. Anatomic alignment. No acute bony abnormality. Pulmonary apices are clear. Carotid vascular calcification.  IMPRESSION: 1. Diffuse multilevel degenerative change with prior multilevel bony fusion and posterior fusion again noted at C7-T1. Hardware intact. Anatomic alignment. No acute bony abnormality.  2.  Carotid vascular disease.   Electronically Signed   By: Marcello Moores  Register   On: 02/14/2018 13:40   Shoulder Imaging:  Results for orders placed during the hospital encounter of 10/24/15  DG Shoulder Right   Narrative CLINICAL DATA:  Pain after fall 6 weeks ago  EXAM: RIGHT SHOULDER - 2+ VIEW  COMPARISON:  None.  FINDINGS: Degenerative changes in the [facility] joint.  No fracture or dislocation.  IMPRESSION: Degenerative changes.  No  fractures.   Electronically Signed   By: Dorise Bullion III M.D   On: 10/24/2015 16:24    Shoulder-L DG: No results found for this or any previous visit.  Thoracic Imaging: . Thoracic MR w/wo contrast:  Results for orders placed during the hospital encounter of 09/22/07  MR Thoracic Spine W Wo Contrast   Narrative Clinical Data:  Prior cervical fusion 2 months ago.  Now with dysphagia and possible infection on the CT myelogram of 09/21/2007.   MRI CERVICAL AND THORACIC SPINE WITHOUT CONTRAST   Technique:  Multiplanar and multiecho pulse sequences of the cervical spine, to include the craniocervical junction and cervicothoracic junction, and the thoracic spine, were obtained according to standard protocol without intravenous contrast.   Comparison: CT myelogram 09/21/2007   MRI CERVICAL SPINE   Findings:  15 ml Multihance   There is multilevel advanced disc degeneration and spondylosis. There is a solid-appearing fusion at C5-6.  There is been anterior plate fusion of K4-Y1 on 07/21/2007.  There is extensive edema and enhancement throughout the bone marrow of C5,C6 and C7 compatible with infection.  There is thickening and enhancement of the ventral epidural tissues. There is fluid extending around the hardware through the disc space to the ventral epidural space compatible with early ventral epidural abscess. There is moderate spinal stenosis with some compression of the cord at this level.  The canal is narrowed to 7 mm in diameter.  There may be mild edema in the spinal cord at C5, C6, and C7. There is extensive prevertebral soft tissue swelling and enhancement compatible with infection.  In addition there appears to be an abscess surrounding the hardware at C6-7 and extending in the prevertebral tissues in the midline measuring approximately 16 x 25 mm.  Based on the CT myelogram the hardware has backed out of the vertebral bodies and there is lucency around the  screws compatible with infection.   C2-3 mild disc degeneration   C3-4:  Moderate to advanced disc degeneration and spondylosis. There is diffuse uncinate spurring with moderate spinal stenosis. The canal is narrowed to 7.4 mm and there is considerable foraminal narrowing bilaterally.   C4-5:  There is moderate to severe disc degeneration and spondylosis with diffuse uncinate spurring and moderate spinal stenosis with the canal measured to 8.6 mm.  There is bilateral facet arthropathy and by foraminal narrowing.   C5-6:  Prior interbody fusion.  There is enhancement of the vertebral body of C5 which may be reactive edema or possibly osteomyelitis which has spread from C6-7.  There is mild spinal stenosis due to uncinate spurring   C6-7 prior anterior discectomy and fusion with evidence of  osteomyelitis and moderate spinal stenosis and a prevertebral abscess. Early ventral epidural abscess is present due to direct extension from the disc space infection.  Please see above description.   C7-T1 there is approximate 3 mm of anterior slip with disc degeneration and disc bulging.   IMPRESSION: There has been recent anterior discectomy and fusion with hardware at C6-7.  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  There is ventral epidural enhancement with early epidural abscess and moderate spinal stenosis with some mild cord compression at this level.   Multilevel disc degeneration and spondylosis most prominent at C3-4 and C4-5.   MRI THORACIC SPINE   Findings: The thoracic alignment is normal.  There is a chronic compression fracture T3 which appears benign.  Negative for acute fracture.  There is some prevertebral soft tissue swelling extending down to T3 from the infection in the cervical spine as described in the above report.  No evidence of diskitis or osteomyelitis in the thoracic spine.  The cord has normal signal and there is  no cord compression.  There is a large hemangioma in the T12 vertebral body.   Disc degeneration with disc bulging and mild spurring are present at T3-4, T4-5, T7-8, T10-11, and T11-T12.  These areas did not show any cord compression.   IMPRESSION: Negative for diskitis or osteomyelitis in the thoracic spine. Multilevel disc degeneration and spondylosis without significant spinal stenosis.   Critical test results telephoned to Dr. Deirdre Peer at the time of interpretation on date 09/23/2007 at time 10:05 hours. These results were repeated back to the interpreting radiologist for verification.  Provider: Lynelle Doctor   Thoracic CT w contrast:  Results for orders placed during the hospital encounter of 09/21/07  CT Thoracic Spine W Contrast   Narrative Clinical Data:  68 year old male status post anterior decompression of the cervical spine at C3-4 and C4-5 with recurrent pain in the left shoulder.  Dysphagia.   MYELOGRAM INJECTION   Technique:  Informed consent was obtained from the patient prior to the procedure, including potential complications of headache, allergy, infection and pain.  A timeout procedure was performed. With the patient prone, the lower back was prepped with Betadine. 1% Lidocaine was used for local anesthesia.  Lumbar puncture was performed at the left paramidline L3-4level using a 22 gauge needle with return of clear CSF.  10cc of Omnipaque 300was injected into the subarachnoid space .   IMPRESSION: Successful injection of  intrathecal contrast for myelography.   MYELOGRAM CERVICAL AND THORACIC   Technique: ]Following injection of intrathecal Omnipaque contrast, spine imaging in multiple projections was performed using fluoroscopy.   Comparison: MRI cervical spine 09/05/2007   Findings: Conventional myelographic images demonstrate a near total block of contrast at the level of T2-3.  Very little contrast is seen above this level.  The thoracic  spine is unremarkable below this level.  Anterior cervical fusion hardware is present at C6-7 lateral view does shows some contrast layering along the ventral surface.  There is an extradural defect at C3-4 which appears to be a disc osteophyte complex.  Details at cervicothoracic junction are limited.   Fluoroscopy Time: 2.07 minutes   IMPRESSION:   1.  Near total block of contrast at the T2 level.  Please see the CT report below.   CT MYELOGRAPHY CERVICAL SPINE   Technique:  CT imaging of the cervical spine was performed after intrathecal contrast administration. Multiplanar CT image reconstructions were also generated.   Findings:  There is anterior fusion hardware at the C6-7 level. The patient has previous mature bony fusion at C5-6.  There is significant lucency surrounding both screws, particularly the left screw at C6.  There is circumferential lucency about the bone graft material at this C6-7 level.  There is lucency along the superior surface of the C7 screws.  There is significant soft tissue posterior to diffuse level.  This measures 6 mm at the disc level. The canal is narrowed to 6 mm at this level as well.  Slight anterolisthesis at C7-T1 is evident.  The soft tissue behind C6-7 is significantly increased from prior MRI scan.   C2-3: Mild left foraminal narrowing is due to advanced facet hypertrophy and mild uncovertebral spurring.   C3-4: The diffuse disc osteophyte complex is present.  This results in mild moderate central canal stenosis biforaminal narrowing is worse left than right.  The central canal is narrowed to just under 10 mm.   C4-5: A diffuse disc osteophyte complex is present.  This effaces the ventral CSF without clearly contacting the cord.  Moderate right and mild left foraminal narrowing is due to uncovertebral spurring and asymmetric right-sided facet hypertrophy.   C5-6: This is the fused level.  An osteophyte and left  paramidline position effaces the CSF without clearly contacting the cord. There is no significant stenosis.   C6-7: Please see the above discussion regarding the lucency surrounding the screws, bone graft material, and posterior epidural soft tissues.  The right-sided screw at C6 appears to be mostly backed out of the bone and is backed out of the fusion plate by 2 mm.  The osseous foraminal narrowing is worse right than left.   C7-T1: The ventral soft tissue is still seen at the C7 level. Slight anterolisthesis measures 3 mm   There is no significant inferior extension of the ventral soft tissue beyond the C7-T1 level.   IMPRESSION:   1.  Extensive lucency surrounding the hardware at C6 with backing out of the right-sided screw concerning for infection. 2.  Lucency surrounding the bone graft material C6-7 level.  This is also concerning for infection. 3.  Significant increase in ventral epidural soft tissue mass narrowing the central canal at the C6-7 level to 6 mm.  This may representing epidural abscess. 4.  Previous mature fusion at C5-6. 5.  Multilevel spondylosis as described above.   CT MYELOGRAPHY THORACIC SPINE   Technique: CT imaging of the thoracic spine was performed after intrathecal contrast administration.  Multiplanar CT image reconstructions were also generated.   Findings:  The thoracic spine is imaged from C6-L1.  Contrast is somewhat dilute in the upper thoracic spine due to pooling posteriorly with the patient's kyphosis.  On the cervical spine CT, contrast at the T1 level was within normal limits.  There is a remote compression fracture with kyphotic angulation at to T3. Endplate Schmorl's nodes are evident at T5-6, T6-7 and T7-8. Additional Schmorl's nodes are seen throughout the lower thoracic spine.  A large hemangioma is evident at T12, measuring 18 mm maximally.  Mild disc bulges are present at T10-11 and also at T11- 12.  There is partial  effacement ventral CSF without significant stenosis at either level.  Minimal disc bulging is present T7-8. The foramina appear patent bilaterally.   IMPRESSION:   1.  No significant inferior extension of the cervical epidural disease. 2.  Remote compression fracture of T3. 3.  Multiple Schmorl's nodes as described. 4.  Minimal disc bulges at  T11-12 and T10-11 without significant stenosis at either level.  Provider: Lorenda Cahill  Note: Available results from prior imaging studies were reviewed.        ROS  Cardiovascular History: No reported cardiovascular signs or symptoms such as High blood pressure, coronary artery disease, abnormal heart rate or rhythm, heart attack, blood thinner therapy or heart weakness and/or failure Pulmonary or Respiratory History: No reported pulmonary signs or symptoms such as wheezing and difficulty taking a deep full breath (Asthma), difficulty blowing air out (Emphysema), coughing up mucus (Bronchitis), persistent dry cough, or temporary stoppage of breathing during sleep Neurological History: No reported neurological signs or symptoms such as seizures, abnormal skin sensations, urinary and/or fecal incontinence, being born with an abnormal open spine and/or a tethered spinal cord Review of Past Neurological Studies: No results found for this or any previous visit. Psychological-Psychiatric History: No reported psychological or psychiatric signs or symptoms such as difficulty sleeping, anxiety, depression, delusions or hallucinations (schizophrenial), mood swings (bipolar disorders) or suicidal ideations or attempts Gastrointestinal History: No reported gastrointestinal signs or symptoms such as vomiting or evacuating blood, reflux, heartburn, alternating episodes of diarrhea and constipation, inflamed or scarred liver, or pancreas or irrregular and/or infrequent bowel movements Genitourinary History: No reported renal or genitourinary signs or symptoms such as  difficulty voiding or producing urine, peeing blood, non-functioning kidney, kidney stones, difficulty emptying the bladder, difficulty controlling the flow of urine, or chronic kidney disease Hematological History: No reported hematological signs or symptoms such as prolonged bleeding, low or poor functioning platelets, bruising or bleeding easily, hereditary bleeding problems, low energy levels due to low hemoglobin or being anemic Endocrine History: No reported endocrine signs or symptoms such as high or low blood sugar, rapid heart rate due to high thyroid levels, obesity or weight gain due to slow thyroid or thyroid disease Rheumatologic History: No reported rheumatological signs and symptoms such as fatigue, joint pain, tenderness, swelling, redness, heat, stiffness, decreased range of motion, with or without associated rash Musculoskeletal History: Negative for myasthenia gravis, muscular dystrophy, multiple sclerosis or malignant hyperthermia Work History: Retired  Allergies  Mike Farley is allergic to morphine and amoxicillin.  Laboratory Chemistry  Inflammation Markers Lab Results  Component Value Date   CRP 0.1 11/14/2007   ESRSEDRATE 4 11/14/2007   (CRP: Acute Phase) (ESR: Chronic Phase) Renal Function Markers Lab Results  Component Value Date   BUN 20 05/30/2018   CREATININE 1.10 05/30/2018   GFRAA  05/29/2009    >60        The eGFR has been calculated using the MDRD equation. This calculation has not been validated in all clinical situations. eGFR's persistently <60 mL/min signify possible Chronic Kidney Disease.   GFRNONAA >60 05/29/2009   Hepatic Function Markers Lab Results  Component Value Date   AST 20 05/30/2018   ALT 13 05/30/2018   ALBUMIN 4.4 05/30/2018   ALKPHOS 106 05/30/2018   Electrolytes Lab Results  Component Value Date   NA 137 05/30/2018   K 4.1 05/30/2018   CL 99 05/30/2018   CALCIUM 9.9 05/30/2018   Neuropathy Markers Lab Results   Component Value Date   YNWGNFAO13 086 04/05/2018   Bone Pathology Markers Lab Results  Component Value Date   ALKPHOS 106 05/30/2018   CALCIUM 9.9 05/30/2018   Coagulation Parameters Lab Results  Component Value Date   INR 1.03 05/29/2009   LABPROT 13.4 05/29/2009   APTT 31 05/29/2009   PLT 246.0 05/30/2018   Cardiovascular Markers Lab  Results  Component Value Date   HGB 12.7 (L) 05/30/2018   HCT 38.2 (L) 05/30/2018   Note: Lab results reviewed.  PFSH  Drug: Mike Farley  has no history on file for drug. Alcohol:  reports current alcohol use. Tobacco:  reports that he has quit smoking. His smokeless tobacco use includes chew. Medical:  has a past medical history of Cervical pain (neck), Chronic pain, and Depression. Family: family history includes Alcohol abuse in his brother; Arthritis in his mother; Cancer in his father and mother; Kidney disease in his brother; Mental illness in his mother.  Past Surgical History:  Procedure Laterality Date  . ANTERIOR FUSION CERVICAL SPINE  2011   4-5 levels  . APPENDECTOMY    . BONE EXCISION Left 09/28/2017   Procedure: BONE EXCISION-TAILORS  EXOSTECTOMY;  Surgeon: Samara Deist, DPM;  Location: [facility];  Service: Podiatry;  Laterality: Left;  IVA LOCAL  . CERVICAL DISCECTOMY  2008   posterior  . CHOLECYSTECTOMY    . ELBOW FRACTURE SURGERY Left 2004  . SHOULDER SURGERY Right   . TONSILLECTOMY AND ADENOIDECTOMY    . TOTAL KNEE ARTHROPLASTY Right 2005   x 4   Active Ambulatory Problems    Diagnosis Date Noted  . Neck pain 06/29/2015  . Knee pain 06/29/2015  . Sleep difficulties 06/29/2015  . Health care maintenance 06/29/2015  . Status post rotator cuff repair 11/02/2015  . Elevated PSA 02/28/2017  . Hyperglycemia 02/28/2017  . Pre-op evaluation 04/08/2018  . Arthritis of right acromioclavicular joint 02/01/2017  . Biceps tendinopathy, right 01/08/2017  . Cardiac murmur 07/14/2015  . Cervical spondylosis  without myelopathy 04/19/2018  . Chronic, continuous use of opioids 10/12/2016  . Complete tear of right rotator cuff 02/01/2017  . Rotator cuff tear arthropathy, right 02/01/2017  . Primary localized osteoarthrosis of right shoulder 01/08/2017  . Shoulder pain 01/15/2016  . Subacromial impingement, right 01/08/2017  . Chronic pain syndrome 06/20/2018  . Long term current use of opiate analgesic 06/20/2018  . Pharmacologic therapy 06/20/2018  . Disorder of skeletal system 06/20/2018  . Problems influencing health status 06/20/2018  . Chronic neck pain (Primary Area of Pain) 06/20/2018  . Occipital neuralgia of right side (Secondary Area of Pain) 06/20/2018   Resolved Ambulatory Problems    Diagnosis Date Noted  . ACUTE OSTEOMYELITIS OTHER SPECIFIED SITE 11/14/2007  . Chest pain 06/29/2015  . Scalp laceration 11/02/2015  . URI (upper respiratory infection) 07/16/2017   Past Medical History:  Diagnosis Date  . Cervical pain (neck)   . Chronic pain   . Depression    Constitutional Exam  General appearance: Well nourished, well developed, and well hydrated. In no apparent acute distress Vitals:   06/20/18 1437  BP: 138/89  Pulse: 76  Temp: 98 F (36.7 C)  SpO2: 99%  Weight: 158 lb (71.7 kg)  Height: 5' 9"  (1.753 m)   BMI Assessment: Estimated body mass index is 23.33 kg/m as calculated from the following:   Height as of this encounter: 5' 9"  (1.753 m).   Weight as of this encounter: 158 lb (71.7 kg).  BMI interpretation table: BMI level Category Range association with higher incidence of chronic pain  <18 kg/m2 Underweight   18.5-24.9 kg/m2 Ideal body weight   25-29.9 kg/m2 Overweight Increased incidence by 20%  30-34.9 kg/m2 Obese (Class I) Increased incidence by 68%  35-39.9 kg/m2 Severe obesity (Class II) Increased incidence by 136%  >40 kg/m2 Extreme obesity (Class III) Increased incidence by  254%   BMI Readings from Last 4 Encounters:  06/20/18 23.33 kg/m   05/30/18 25.61 kg/m  04/05/18 25.76 kg/m  01/10/18 24.18 kg/m   Wt Readings from Last 4 Encounters:  06/20/18 158 lb (71.7 kg)  05/30/18 168 lb 6.4 oz (76.4 kg)  04/05/18 169 lb 6.4 oz (76.8 kg)  01/10/18 159 lb (72.1 kg)  Psych/Mental status: Alert, oriented x 3 (person, place, & time)       Eyes: PERLA Respiratory: No evidence of acute respiratory distress  Cervical Spine Exam  Inspection: Well healed scar from previous spine surgery detected Alignment: Symmetrical Functional ROM: Decreased ROM      Stability: No instability detected Muscle strength & Tone: Functionally intact Sensory: Unimpaired Palpation: Complains of area being tender to palpation Cervical compression test for right occipital neuralgia  Upper Extremity (UE) Exam    Side: Right upper extremity  Side: Left upper extremity  Inspection: No masses, redness, swelling, or asymmetry. No contractures  Inspection: No masses, redness, swelling, or asymmetry. No contractures  Functional ROM: Unrestricted ROM          Functional ROM: Unrestricted ROM          Muscle strength & Tone: Functionally intact  Muscle strength & Tone: Functionally intact  Sensory: Unimpaired  Sensory: Unimpaired  Palpation: No palpable anomalies              Palpation: No palpable anomalies              Specialized Test(s): Deferred         Specialized Test(s): Deferred          Thoracic Spine Exam  Inspection: No masses, redness, or swelling Alignment: Symmetrical Functional ROM: Unrestricted ROM Stability: No instability detected Sensory: Unimpaired Muscle strength & Tone: No palpable anomalies  Lumbar Spine Exam  Inspection: No masses, redness, or swelling Alignment: Symmetrical Functional ROM: Unrestricted ROM      Stability: No instability detected Muscle strength & Tone: Functionally intact Sensory: Unimpaired Palpation: No palpable anomalies       Provocative Tests: Lumbar Hyperextension and rotation test: evaluation  deferred today       Patrick's Maneuver: evaluation deferred today                    Gait & Posture Assessment  Ambulation: Unassisted Gait: Relatively normal for age and body habitus Posture: WNL   Lower Extremity Exam    Side: Right lower extremity  Side: Left lower extremity  Inspection: No masses, redness, swelling, or asymmetry. No contractures  Inspection: No masses, redness, swelling, or asymmetry. No contractures  Functional ROM: Unrestricted ROM          Functional ROM: Unrestricted ROM          Muscle strength & Tone: Functionally intact  Muscle strength & Tone: Functionally intact  Sensory: Unimpaired  Sensory: Unimpaired  Palpation: No palpable anomalies  Palpation: No palpable anomalies   Assessment  Primary Diagnosis & Pertinent Problem List: The primary encounter diagnosis was Chronic neck pain (Primary Area of Pain). Diagnoses of Occipital neuralgia of right side (Secondary Area of Pain), Chronic pain syndrome, Long term current use of opiate analgesic, Pharmacologic therapy, Disorder of skeletal system, and Problems influencing health status were also pertinent to this visit.  Visit Diagnosis: 1. Chronic neck pain (Primary Area of Pain)   2. Occipital neuralgia of right side (Secondary Area of Pain)   3. Chronic pain syndrome   4. Long term current  use of opiate analgesic   5. Pharmacologic therapy   6. Disorder of skeletal system   7. Problems influencing health status    Plan of Care  Initial treatment plan:  Please be advised that as per protocol, today's visit has been an evaluation only. We have not taken over the patient's controlled substance management.  Problem-specific plan: No problem-specific Assessment & Plan notes found for this encounter.  Ordered Lab-work, Procedure(s), Referral(s), & Consult(s): Orders Placed This Encounter  Procedures  . Compliance Drug Analysis, Ur  . Comp. Metabolic Panel (12)  . Magnesium  . Vitamin B12  .  Sedimentation rate  . 25-Hydroxyvitamin D Lcms D2+D3  . C-reactive protein   Pharmacotherapy: Medications ordered:  No orders of the defined types were placed in this encounter.  Medications administered during this visit: Emanuelle Bastos. [place] had no medications administered during this visit.   Pharmacotherapy under consideration:  Opioid Analgesics: The patient was informed that there is no guarantee that he would be a candidate for opioid analgesics. The decision will be made following CDC guidelines. This decision will be based on the results of diagnostic studies, as well as Mike Farley risk profile.  Membrane stabilizer: To be determined at a later time Muscle relaxant: To be determined at a later time NSAID: To be determined at a later time Other analgesic(s): To be determined at a later time   Interventional therapies under consideration: Mike Farley was informed that there is no guarantee that he would be a candidate for interventional therapies. The decision will be based on the results of diagnostic studies, as well as Mike Farley risk profile.  Possible procedure(s): Diagnostic right-sided occipital nerve block Diagnostic right-sided cervical facet nerve block Possible right-sided occipital nerve radiofrequency ablation Possible right-sided cervical facet nerve block   Provider-requested follow-up: Return for 2nd Visit, w/ Dr. Dossie Arbour.  Future Appointments  Date Time Provider [place]  07/10/2018 10:30 AM Milinda Pointer, MD [facility] None  07/20/2018  9:00 AM Einar Pheasant, MD [facility]  10/10/2018 10:30 AM Einar Pheasant, MD [facility]  10/10/2018 11:00 AM O'Brien-Blaney, Bryson Corona, LPN [facility]    Primary Care Physician: Einar Pheasant, MD Location: [facility] Note by:  Date: 06/20/2018; Time: 3:36 PM  Pain Score Disclaimer: We use the NRS-11 scale. This is a self-reported, subjective measurement  of pain severity with only modest accuracy. It is used primarily to identify changes within a particular patient. It must be understood that outpatient pain scales are significantly less accurate that those used for research, where they can be applied under ideal controlled circumstances with minimal exposure to variables. In reality, the score is likely to be a combination of pain intensity and pain affect, where pain affect describes the degree of emotional arousal or changes in action readiness caused by the sensory experience of pain. Factors such as social and work situation, setting, emotional state, anxiety levels, expectation, and prior pain experience may influence pain perception and show large inter-individual differences that may also be affected by time variables.  Patient instructions provided during this appointment: Patient Instructions   ____________________________________________________________________________________________  Appointment Policy Summary  It is our goal and responsibility to provide the medical community with assistance in the evaluation and management of patients with chronic pain. Unfortunately our resources are limited. Because we do not have an unlimited amount of time, or available appointments, we are required to closely monitor and manage their use. The following rules exist to maximize their use:  Patient's responsibilities: 1. Punctuality:  At what time should I arrive? You should be physically present in our office 30 minutes before your scheduled appointment. Your scheduled appointment is with your assigned healthcare provider. However, it takes 5-10 minutes to be "checked-in", and another 15 minutes for the nurses to do the admission. If you arrive to our office at the time you were given for your appointment, you will end up being at least 20-25 minutes late to your appointment with the provider. 2. Tardiness:  What happens if I arrive only a few  minutes after my scheduled appointment time? You will need to reschedule your appointment. The cutoff is your appointment time. This is why it is so important that you arrive at least 30 minutes before that appointment. If you have an appointment scheduled for 10:00 AM and you arrive at 10:01, you will be required to reschedule your appointment.  3. Plan ahead:  Always assume that you will encounter traffic on your way in. Plan for it. If you are dependent on a driver, make sure they understand these rules and the need to arrive early. 4. Other appointments and responsibilities:  Avoid scheduling any other appointments before or after your pain clinic appointments.  5. Be prepared:  Write down everything that you need to discuss with your healthcare provider and give this information to the admitting nurse. Write down the medications that you will need refilled. Bring your pills and bottles (even the empty ones), to all of your appointments, except for those where a procedure is scheduled. 6. No children or pets:  Find someone to take care of them. It is not appropriate to bring them in. 7. Scheduling changes:  We request "advanced notification" of any changes or cancellations. 8. Advanced notification:  Defined as a time period of more than 24 hours prior to the originally scheduled appointment. This allows for the appointment to be offered to other patients. 9. Rescheduling:  When a visit is rescheduled, it will require the cancellation of the original appointment. For this reason they both fall within the category of "Cancellations".  10. Cancellations:  They require advanced notification. Any cancellation less than 24 hours before the  appointment will be recorded as a "No Show". 11. No Show:  Defined as an unkept appointment where the patient failed to notify or declare to the practice their intention or inability to keep the appointment.  Corrective process for repeat offenders:   1. Tardiness: Three (3) episodes of rescheduling due to late arrivals will be recorded as one (1) "No Show". 2. Cancellation or reschedule: Three (3) cancellations or rescheduling will be recorded as one (1) "No Show". 3. "No Shows": Three (3) "No Shows" within a 12 month period will result in discharge from the practice. ____________________________________________________________________________________________   ______________________________________________________________________________________________  Specialty Pain Scale  Introduction:  There are significant differences in how pain is reported. The word pain usually refers to physical pain, but it is also a common synonym of suffering. The medical community uses a scale from 0 (zero) to 10 (ten) to report pain level. Zero (0) is described as "no pain", while ten (10) is described as "the worse pain you can imagine". The problem with this scale is that physical pain is reported along with suffering. Suffering refers to mental pain, or more often yet it refers to any unpleasant feeling, emotion or aversion associated with the perception of harm or threat of harm. It is the psychological component of pain.  Pain Specialists prefer  to separate the two components. The pain scale used by this practice is the Verbal Numerical Rating Scale (VNRS-11). This scale is for the physical pain only. DO NOT INCLUDE how your pain psychologically affects you. This scale is for adults 12 years of age and older. It has 11 (eleven) levels. The 1st level is 0/10. This means: "right now, I have no pain". In the context of pain management, it also means: "right now, my physical pain is under control with the current therapy".  General Information:  The scale should reflect your current level of pain. Unless you are specifically asked for the level of your worst pain, or your average pain. If you are asked for one of these two, then it should be understood that it  is over the past 24 hours.  Levels 1 (one) through 5 (five) are described below, and can be treated as an outpatient. Ambulatory pain management facilities such as ours are more than adequate to treat these levels. Levels 6 (six) through 10 (ten) are also described below, however, these must be treated as a hospitalized patient. While levels 6 (six) and 7 (seven) may be evaluated at an urgent care facility, levels 8 (eight) through 10 (ten) constitute medical emergencies and as such, they belong in a hospital's emergency department. When having these levels (as described below), do not come to our office. Our facility is not equipped to manage these levels. Go directly to an urgent care facility or an emergency department to be evaluated.  Definitions:  Activities of Daily Living (ADL): Activities of daily living (ADL or ADLs) is a term used in healthcare to refer to people's daily self-care activities. Health professionals often use a person's ability or inability to perform ADLs as a measurement of their functional status, particularly in regard to people post injury, with disabilities and the elderly. There are two ADL levels: Basic and Instrumental. Basic Activities of Daily Living (BADL  or BADLs) consist of self-care tasks that include: Bathing and showering; personal hygiene and grooming (including brushing/combing/styling hair); dressing; Toilet hygiene (getting to the toilet, cleaning oneself, and getting back up); eating and self-feeding (not including cooking or chewing and swallowing); functional mobility, often referred to as "transferring", as measured by the ability to walk, get in and out of bed, and get into and out of a chair; the broader definition (moving from one place to another while performing activities) is useful for people with different physical abilities who are still able to get around independently. Basic ADLs include the things many people do when they get up in the morning and  get ready to go out of the house: get out of bed, go to the toilet, bathe, dress, groom, and eat. On the average, loss of function typically follows a particular order. Hygiene is the first to go, followed by loss of toilet use and locomotion. The last to go is the ability to eat. When there is only one remaining area in which the person is independent, there is a 62.9% chance that it is eating and only a 3.5% chance that it is hygiene. Instrumental Activities of Daily Living (IADL or IADLs) are not necessary for fundamental functioning, but they let an individual live independently in a community. IADL consist of tasks that include: cleaning and maintaining the house; home establishment and maintenance; care of others (including selecting and supervising caregivers); care of pets; child rearing; managing money; managing financials (investments, etc.); meal preparation and cleanup; shopping for groceries and necessities;  moving within the community; safety procedures and emergency responses; health management and maintenance (taking prescribed medications); and using the telephone or other form of communication.  Instructions:  Most patients tend to report their pain as a combination of two factors, their physical pain and their psychosocial pain. This last one is also known as "suffering" and it is reflection of how physical pain affects you socially and psychologically. From now on, report them separately.  From this point on, when asked to report your pain level, report only your physical pain. Use the following table for reference.  Pain Clinic Pain Levels (0-5/10)  Pain Level Score  Description  No Pain 0   Mild pain 1 Nagging, annoying, but does not interfere with basic activities of daily living (ADL). Patients are able to eat, bathe, get dressed, toileting (being able to get on and off the toilet and perform personal hygiene functions), transfer (move in and out of bed or a chair without  assistance), and maintain continence (able to control bladder and bowel functions). Blood pressure and heart rate are unaffected. A normal heart rate for a healthy adult ranges from 60 to 100 bpm (beats per minute).   Mild to moderate pain 2 Noticeable and distracting. Impossible to hide from other people. More frequent flare-ups. Still possible to adapt and function close to normal. It can be very annoying and may have occasional stronger flare-ups. With discipline, patients may get used to it and adapt.   Moderate pain 3 Interferes significantly with activities of daily living (ADL). It becomes difficult to feed, bathe, get dressed, get on and off the toilet or to perform personal hygiene functions. Difficult to get in and out of bed or a chair without assistance. Very distracting. With effort, it can be ignored when deeply involved in activities.   Moderately severe pain 4 Impossible to ignore for more than a few minutes. With effort, patients may still be able to manage work or participate in some social activities. Very difficult to concentrate. Signs of autonomic nervous system discharge are evident: dilated pupils (mydriasis); mild sweating (diaphoresis); sleep interference. Heart rate becomes elevated (>115 bpm). Diastolic blood pressure (lower number) rises above 100 mmHg. Patients find relief in laying down and not moving.   Severe pain 5 Intense and extremely unpleasant. Associated with frowning face and frequent crying. Pain overwhelms the senses.  Ability to do any activity or maintain social relationships becomes significantly limited. Conversation becomes difficult. Pacing back and forth is common, as getting into a comfortable position is nearly impossible. Pain wakes you up from deep sleep. Physical signs will be obvious: pupillary dilation; increased sweating; goosebumps; brisk reflexes; cold, clammy hands and feet; nausea, vomiting or dry heaves; loss of appetite; significant sleep  disturbance with inability to fall asleep or to remain asleep. When persistent, significant weight loss is observed due to the complete loss of appetite and sleep deprivation.  Blood pressure and heart rate becomes significantly elevated. Caution: If elevated blood pressure triggers a pounding headache associated with blurred vision, then the patient should immediately seek attention at an urgent or emergency care unit, as these may be signs of an impending stroke.    Emergency Department Pain Levels (6-10/10)  Emergency Room Pain 6 Severely limiting. Requires emergency care and should not be seen or managed at an outpatient pain management facility. Communication becomes difficult and requires great effort. Assistance to reach the emergency department may be required. Facial flushing and profuse sweating along with potentially dangerous increases  in heart rate and blood pressure will be evident.   Distressing pain 7 Self-care is very difficult. Assistance is required to transport, or use restroom. Assistance to reach the emergency department will be required. Tasks requiring coordination, such as bathing and getting dressed become very difficult.   Disabling pain 8 Self-care is no longer possible. At this level, pain is disabling. The individual is unable to do even the most "basic" activities such as walking, eating, bathing, dressing, transferring to a bed, or toileting. Fine motor skills are lost. It is difficult to think clearly.   Incapacitating pain 9 Pain becomes incapacitating. Thought processing is no longer possible. Difficult to remember your own name. Control of movement and coordination are lost.   The worst pain imaginable 10 At this level, most patients pass out from pain. When this level is reached, collapse of the autonomic nervous system occurs, leading to a sudden drop in blood pressure and heart rate. This in turn results in a temporary and dramatic drop in blood flow to the brain,  leading to a loss of consciousness. Fainting is one of the body's self defense mechanisms. Passing out puts the brain in a calmed state and causes it to shut down for a while, in order to begin the healing process.    Summary: 1. Refer to this scale when providing [place] with your pain level. 2. Be accurate and careful when reporting your pain level. This will help with your care. 3. Over-reporting your pain level will lead to loss of credibility. 4. Even a level of 1/10 means that there is pain and will be treated at our facility. 5. High, inaccurate reporting will be documented as "Symptom Exaggeration", leading to loss of credibility and suspicions of possible secondary gains such as obtaining more narcotics, or wanting to appear disabled, for fraudulent reasons. 6. Only pain levels of 5 or below will be seen at our facility. 7. Pain levels of 6 and above will be sent to the Emergency Department and the appointment cancelled. ______________________________________________________________________________________________

## 2018-06-24 LAB — COMP. METABOLIC PANEL (12)
AST: 27 IU/L (ref 0–40)
Albumin/Globulin Ratio: 2 (ref 1.2–2.2)
Albumin: 4.5 g/dL (ref 3.8–4.8)
Alkaline Phosphatase: 127 IU/L — ABNORMAL HIGH (ref 39–117)
BUN/Creatinine Ratio: 10 (ref 10–24)
BUN: 9 mg/dL (ref 8–27)
Bilirubin Total: 0.3 mg/dL (ref 0.0–1.2)
Calcium: 9.4 mg/dL (ref 8.6–10.2)
Chloride: 97 mmol/L (ref 96–106)
Creatinine, Ser: 0.87 mg/dL (ref 0.76–1.27)
GFR calc Af Amer: 103 mL/min/{1.73_m2} (ref >59)
GFR calc non Af Amer: 89 mL/min/{1.73_m2} (ref >59)
Globulin, Total: 2.2 g/dL (ref 1.5–4.5)
Glucose: 97 mg/dL (ref 65–99)
Potassium: 4.4 mmol/L (ref 3.5–5.2)
Sodium: 141 mmol/L (ref 134–144)
Total Protein: 6.7 g/dL (ref 6.0–8.5)

## 2018-06-24 LAB — 25-HYDROXY VITAMIN D LCMS D2+D3
25-Hydroxy, Vitamin D-2: 2.8 ng/mL
25-Hydroxy, Vitamin D-3: 26 ng/mL
25-Hydroxy, Vitamin D: 29 ng/mL — ABNORMAL LOW

## 2018-06-24 LAB — VITAMIN B12: Vitamin B-12: 635 pg/mL (ref 232–1245)

## 2018-06-24 LAB — SEDIMENTATION RATE: Sed Rate: 7 mm/hr (ref 0–30)

## 2018-06-24 LAB — C-REACTIVE PROTEIN: CRP: 3 mg/L (ref 0–10)

## 2018-06-24 LAB — MAGNESIUM: Magnesium: 2 mg/dL (ref 1.6–2.3)

## 2018-06-25 LAB — COMPLIANCE DRUG ANALYSIS, UR

## 2018-07-06 NOTE — Progress Notes (Signed)
Patient's Name: Mike Farley  MRN: [id number]  Referring Provider: Einar Pheasant, MD  DOB: [birth year]  PCP: Einar Pheasant, MD  DOS: 07/10/2018  Note by: Gaspar Cola, MD  Service setting: Ambulatory outpatient  Specialty: Interventional Pain Management  Location: [facility] (AMB) Pain Management Facility    Patient type: Established   Primary Reason(s) for Visit: Encounter for evaluation before starting new chronic pain management plan of care (Level of risk: moderate) CC: Headache (back)  HPI  Mike Farley is a 68 y.o. year old, male patient, who comes today for a follow-up evaluation to review the test results and decide on a treatment plan. 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); Complete tear of rotator cuff (Right); Rotator cuff tear arthropathy (Right); Primary localized osteoarthrosis of shoulder (Right); Subacromial impingement (Right); Chronic pain syndrome; Long term current use of opiate analgesic; Pharmacologic therapy; Disorder of skeletal system; Problems influencing health status; Chronic neck pain (Primary Area of Pain); Occipital neuralgia (Right); Osteoarthritis of AC (acromioclavicular) joint (Right); Chronic shoulder pain (Right); Vitamin D insufficiency; Opioid use (MME/day: 540 mg/day); Physical tolerance to opiate drug (Primary Problem); Chronic neck pain with history of cervical spinal surgery; History of cervical spinal surgery; 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 His primarily concern today is the Headache (back)  Pain  Assessment: Location: Other (Comment)(back) Head Radiating: denies Onset: More than a month ago Duration: Chronic pain(occipital neuralgia) Quality: Stabbing, Sharp Severity: 4 /10 (subjective, self-reported pain score)  Note: Reported level is inconsistent with clinical observations. Clinically the patient looks like a 3/10 A 3/10 is viewed as "Moderate" and described as significantly interfering with activities of daily living (ADL). It becomes difficult to feed, bathe, get dressed, get on and off the toilet or to perform personal hygiene functions. Difficult to get in and out of bed or a chair without assistance. Very distracting. With effort, it can be ignored when deeply involved in activities. Information on the proper use of the pain scale provided to the patient today. When using our objective Pain Scale, levels between 6 and 10/10 are said to belong in an emergency room, as it progressively worsens from a 6/10, described as severely limiting, requiring emergency care not usually available at an outpatient pain management facility. At a 6/10 level, communication becomes difficult and requires great effort. Assistance to reach the emergency department may be required. Facial flushing and profuse sweating along with potentially dangerous increases in heart rate and blood pressure will be evident. Timing: Constant Modifying factors: lying down BP: 114/80  HR: 80  Mike Farley comes in today for a follow-up visit after his initial evaluation on 06/20/2018. Today we went over the results of his tests. These were explained in "Layman's terms". During today's appointment we went over my diagnostic impression, as well as the proposed treatment plan.  According to the patient his primary area of pain is in his neck.  He has undergone several cervical fusions last being November 2019 C4-5 by Dr. Carloyn Manner.  He feels like he is doing well with the surgery.  He is having pain in the back of the head.  He  denies headaches.  He denies any radiating pain.  He  denies any interventional therapy.  He is done home physical therapy.  He has had recent CT cervical spine.  During today's appointment, I covered all of the test that we had ordered for him as well as those that he had ordered by other physicians.  We went over his recent cervical CT and it would seem that he has severe arthropathy involving the occipital-atlanto-axial region.  This is giving him pain in the suboccipital area with no radiation to the top of the head or above the years.  I have offered him an injection into those joints to see if that can provide him with some relief of the pain, but if it does not, then we will need to consider the possibility of doing medial branch blocks to determine if he may be a candidate for radiofrequency ablation.  Other alternatives include the possibility of using occipital peripheral nerve stimulators.  Having covered that, been I went on to talk to him about his opioids.  Clearly, he has been on those for many years without having stopped them.  Of concern is the fact that when I asked him about his opioid use he attempted to downplay it indicating that he was only taking oxycodone 20 mg 2 tablets every 8 hours.  This would be a total of 6 tablets/day or 120 mg/day of oxycodone.  Since I had already looked at the PMP, I told him to help me out with the math since it was not making any sense to me.  I went over each 1 of his prescriptions, which have been written for periods of 14 days, when he has been receiving 252 pills for each prescription.  If he was really taking the medicine like he said he was taking it, but this would have, to be 180 tablets every 30 days.  To be fair in clarify this I went ahead and counted old prescriptions that he had filled from 06/14/2017 all the way down to 06/10/2018.  He received a total of 17 prescriptions, each containing 252 pills.  For the purpose of getting an estimate amount  of pills that he is actually consuming, assuming that he has told me the truth that he is not hoarding or stockpiling any pills and that he is actually taking these pills, I have assumed that we are dealing with 16 prescriptions since the last one was failed on 06/10/2018, and I will know how long that was can the last.  So if we take this 16 prescriptions and multiply them by the 252 pills he received each time, then we have a grand total of 4032 pills.  If we did not take the amount of days between 06/14/2017 and 06/10/2018, this turns out to be 361 days.  If we did not take the 4032 pills and divided by the 361 days, we come up with an approximation of 11 pills/day or a total of approximately 220 mg/day of oxycodone.  After I did the math, he confessed that he has been using more than 6 pills/day and he quoted using about 10 pills/day.  This is still short by 1 pill/day on what he actually averages.  In any case, today I informed him that what I had to offer was combination of the interventional techniques + tapering his opioids down until we could completely stop them.  He indicated to me that he has already been trying that, which is also not true since I did a PMP search dating back to 2014 and  he has been getting the same amount of pills since then, without any decreases.  This makes me think that he is not being 100% honest with me or himself.  In any case, today I was very clear about the fact that should he decide to come on board, we will begin tapering his opioids down at a rate of 5 mg/week until we can completely stop them for the purpose of a "drug holiday" of at least 3 to 4 weeks.  I was also very clear that this "Drug Holiday" would be non-negotiable and once we started the drug taper, we would not be going back for any reason.  He indicated having understood and agreed with the plan.  Today I have provided the patient with written information regarding our medication policy, guidelines, and  recommendations as well as a copy of our medication agreement so that he can take at home and study it.  I have also requested that he bring back all of the pain medications that he may have at home, all of the remaining low-dose, hydrocodone's, codeine, morphine, Mestinon, any type of opioid that he may have at home so that we can count them and dispose of them in front of witnesses.  Once he does that, then we would start him on the appropriate taper.  If my calculations are correct, then he is taking approximately 330 MME's per day.  In considering the treatment plan options, Mike Farley was reminded that I no longer take patients for medication management only. I asked him to let me know if he had no intention of taking advantage of the interventional therapies, so that we could make arrangements to provide this space to someone interested. I also made it clear that undergoing interventional therapies for the purpose of getting pain medications is very inappropriate on the part of a patient, and it will not be tolerated in this practice. This type of behavior would suggest true addiction and therefore it requires referral to an addiction specialist.   Further details on both, my assessment(s), as well as the proposed treatment plan, please see below.  Controlled Substance Pharmacotherapy Assessment REMS (Risk Evaluation and Mitigation Strategy)  Analgesic: Oxycodone 20 mg 3 tablets every 4 hours ( last fill date 05/02/2018) (360 mg/day of oxycodone) (according to the patient he only takes 40 mg 3 times daily, which based on my calculations is not accurate.  Later on he admitted that he actually takes more than that and based on my math, he is averaging approximately 11 pills/day for a total of 220 mg/day of oxycodone). Highest recorded MME/day: 540 mg/day MME/day: 330 mg/day  Pill Count: None expected due to no prior prescriptions written by our practice. Landis Martins, RN  07/10/2018 10:23 AM   Sign when Signing Visit Safety precautions to be maintained throughout the outpatient stay will include: orient to surroundings, keep bed in low position, maintain call bell within reach at all times, provide assistance with transfer out of bed and ambulation.    Pharmacokinetics: Liberation and absorption (onset of action): WNL Distribution (time to peak effect): WNL Metabolism and excretion (duration of action): WNL         Pharmacodynamics: Desired effects: Analgesia: Mike Farley reports >50% benefit. Functional ability: Patient reports that medication allows him to accomplish basic ADLs Clinically meaningful improvement in function (CMIF): Sustained CMIF goals met Perceived effectiveness: Described as relatively effective, allowing for increase in activities of daily living (ADL) Undesirable effects: Side-effects or Adverse reactions:  None reported Monitoring: [place] PMP: Online review of the past 12-monthperiod previously conducted. Not applicable at this point since we have not taken over the patient's medication management yet. List of other Serum/Urine Drug Screening Test(s):  No results found. List of all UDS test(s) done:  Lab Results  Component Value Date   SUMMARY FINAL 06/20/2018   Last UDS on record: Summary  Date Value Ref Range Status  06/20/2018 FINAL  Final    Comment:    ==================================================================== [facility]RE COMP DRUG ANALYSIS,UR ==================================================================== Test                             Result       Flag       Units Drug Present and Declared for Prescription Verification   Oxycodone                      11216        EXPECTED   ng/mg creat   Oxymorphone                    10525        EXPECTED   ng/mg creat   Noroxycodone                   >17544       EXPECTED   ng/mg creat   Noroxymorphone                 6949         EXPECTED   ng/mg creat    Sources of oxycodone are  scheduled prescription medications.    Oxymorphone, noroxycodone, and noroxymorphone are expected    metabolites of oxycodone. Oxymorphone is also available as a    scheduled prescription medication.   Quetiapine                     PRESENT      EXPECTED Drug Absent but Declared for Prescription Verification   Ephedrine/Pseudoephedrine      Not Detected UNEXPECTED ==================================================================== Test                      Result    Flag   Units      Ref Range   Creatinine              57               mg/dL      >=20 ==================================================================== Declared Medications:  The flagging and interpretation on this report are based on the  following declared medications.  Unexpected results may arise from  inaccuracies in the declared medications.  **Note: The testing scope of this panel includes these medications:  Oxycodone  Pseudoephedrine  Quetiapine (Seroquel)  **Note: The testing scope of this panel does not include following  reported medications:  Multivitamin (MVI)  Omeprazole (Prilosec)  Ondansetron (Zofran)  Rosuvastatin (Crestor)  Sucralfate (Carafate)  Valacyclovir (Valtrex) ==================================================================== For clinical consultation, please call ([phone] ====================================================================    UDS interpretation: No unexpected findings.          Medication Assessment Form: Patient introduced to form today Treatment compliance: Treatment may start today if patient agrees with proposed plan. Evaluation of compliance is not applicable at this point Risk Assessment Profile: Aberrant behavior: See initial evaluations. None observed or detected today Comorbid factors increasing risk of overdose: See initial evaluation.  No additional risks detected today Opioid risk tool (ORT):  Opioid Risk  06/20/2018  Alcohol 0  Illegal Drugs 0   Rx Drugs 0  Alcohol 0  Illegal Drugs 0  Rx Drugs 0  Age between 16-45 years  0  History of Preadolescent Sexual Abuse 0  Psychological Disease 0  Depression 0  Opioid Risk Tool Scoring 0  Opioid Risk Interpretation Low Risk    ORT Scoring interpretation table:  Score <3 = Low Risk for SUD  Score between 4-7 = Moderate Risk for SUD  Score >8 = High Risk for Opioid Abuse   Risk of substance use disorder (SUD): Low  Risk Mitigation Strategies:  Patient opioid safety counseling: Completed today. Counseling provided to patient as per "Patient Counseling Document". Document signed by patient, attesting to counseling and understanding Patient-Prescriber Agreement (PPA): Obtained today.  Controlled substance notification to other providers: Written and sent today.  Pharmacologic Plan: Today we may be taking over the patient's pharmacological regimen. See below.             Laboratory Chemistry  Inflammation Markers (CRP: Acute Phase) (ESR: Chronic Phase) Lab Results  Component Value Date   CRP 3 06/20/2018   ESRSEDRATE 7 06/20/2018                         Rheumatology Markers No results found.  Renal Function Markers Lab Results  Component Value Date   BUN 9 06/20/2018   CREATININE 0.87 06/20/2018   BCR 10 06/20/2018   GFRAA 103 06/20/2018   GFRNONAA 89 06/20/2018                             Hepatic Function Markers Lab Results  Component Value Date   AST 27 06/20/2018   ALT 13 05/30/2018   ALBUMIN 4.5 06/20/2018   ALKPHOS 127 (H) 06/20/2018                        Electrolytes Lab Results  Component Value Date   NA 141 06/20/2018   K 4.4 06/20/2018   CL 97 06/20/2018   CALCIUM 9.4 06/20/2018   MG 2.0 06/20/2018                        Neuropathy Markers Lab Results  Component Value Date   VITAMINB12 635 06/20/2018   HGBA1C 5.5 04/03/2018                        CNS Tests No results found.  Bone Pathology Markers Lab Results  Component Value Date    25OHVITD1 29 (L) 06/20/2018   25OHVITD2 2.8 06/20/2018   25OHVITD3 26 06/20/2018                         Coagulation Parameters Lab Results  Component Value Date   INR 1.03 05/29/2009   LABPROT 13.4 05/29/2009   APTT 31 05/29/2009   PLT 246.0 05/30/2018                        Cardiovascular Markers Lab Results  Component Value Date   HGB 12.7 (L) 05/30/2018   HCT 38.2 (L) 05/30/2018  CA Markers No results found.  Endocrine Markers Lab Results  Component Value Date   TSH 4.14 04/03/2018                        Note: Lab results reviewed.  Recent Diagnostic Imaging Review  Cervical Imaging: Cervical MR w/wo contrast:  Results for orders placed during the hospital encounter of 04/24/09  MR Cervical Spine W Wo Contrast   Narrative Clinical Data: Worsening right arm and scapular pain.  Previous fusion.   MRI CERVICAL SPINE WITHOUT AND WITH CONTRAST   Technique:  Multiplanar and multiecho pulse sequences of the cervical spine, to include the craniocervical junction and cervicothoracic junction, were obtained according to standard protocol without and with intravenous contrast.   Contrast: 14 ml Multihance   Comparison: 03/11/2089 and multiple previous   Findings: There is no abnormality at the foramen magnum.  There are mild degenerative changes at the C1-2 articulation but no slippage or narrowing of the spinal canal.   At C2-3, there is facet degeneration worse on the left than the right with anterolisthesis of 1 mm.  The disc bulges mildly.  The central canal sufficiently patent.  There is mild foraminal encroachment, not grossly compressive.   At C3-4, there is spondylosis with endplate osteophytes covering bulging disc material.  The ventral subarachnoid space is effaced and the cord is indented slightly.  There is foraminal stenosis bilaterally because of osteophytic encroachment that could effect either C4 nerve root.   At  C4-5, there is spondylosis with endplate osteophytes and bulging disc material.  The ventral subarachnoid space is effaced but the cord is not appear deformed.  There is facet degeneration on the right.  There is foraminal stenosis, right more than left.  Either C5 nerve root could be affected, more likely the right.   There is been previous anterior cervical discectomy and fusion from C5-C7.  Fusion appears solid.  The foramina at C5-6 appear widely patent.  There is foraminal stenosis on the right at C6-7 that could possibly affect the C7 nerve root.   At C7-T1, there is facet degeneration with 3 mm of anterolisthesis. The disc bulges mildly.  There is no compressive effect upon the cord.  There is mild foraminal narrowing bilaterally without definite neural compression.   At T1-2, there is facet degeneration with 2 mm of anterolisthesis. There is shallow protrusion of disc material more towards the right.  This is not appear to affect the cord.  There is mild foraminal narrowing on the right.   There is an old healed compression fracture of T3.   Compared to the previous exam, with findings appear quite similar.   IMPRESSION: C1-2:  Degenerative changes that could be a cause of neck pain.  No slippage or encroachment upon the neural spaces.   C2-3:  Spondylosis and left-sided facet arthropathy.  No apparent neural compression.   C3-4:  Spondylosis with canal narrowing.  Effacement subarachnoid space and slight indentation upon the ventral aspect of the cord. Neural foraminal stenosis bilaterally.   C4-5:  Spondylosis with effacement ventral subarachnoid space but no cord deformity.  Neural foraminal stenosis bilaterally.   Solid fusion from C5-C7.  Foraminal stenosis on the right as C6-7 could effect the C7 nerve root.   C7-T1:  Facet arthropathy with 3 mm anterolisthesis.  Mild foraminal narrowing without definite neural compression.   T1-2:  Facet arthropathy with 2  mm of anterolisthesis.  Shallow right paracentral disc herniation.  Mild foraminal narrowing bilaterally.  No gross neural compression.   I cannot see any definite change since the previous examination.  Provider: Weyman Rodney   Cervical CT wo contrast:  Results for orders placed during the hospital encounter of 02/14/18  CT CERVICAL SPINE WO CONTRAST   Narrative CLINICAL DATA:  68 year old male status post prior cervical spine surgeries. Left shoulder pain and right hand numbness.  EXAM: CT CERVICAL SPINE WITHOUT CONTRAST  TECHNIQUE: Multidetector CT imaging of the cervical spine was performed without intravenous contrast. Multiplanar CT image reconstructions were also generated.  COMPARISON:  Cervical spine CT 12/28/2011 and earlier.  FINDINGS: Alignment: Increase straightening of cervical lordosis since 2013. Cervicothoracic junction alignment remains normal. Mild chronic anterolisthesis of C2 on C3. C1-C2 alignment remains normal.  Skull base and vertebrae: Visualized skull base is intact. No acute osseous abnormality identified. Degenerative and postoperative findings detailed below.  Soft tissues and spinal canal: Negative visible noncontrast brain parenchyma. Negative noncontrast neck soft tissues aside from mild calcified carotid bifurcation atherosclerosis.  Disc levels:  Occiput-C1: Chronic but progressed and severe right occipital condyle-C1 joint degeneration. Vacuum phenomena now (coronal series 7, image 34) superimposed on increased chronic osteophytosis and subchondral sclerosis.  C1-C2: Chronic but increased right far lateral joint space loss with subchondral sclerosis (series 7, image 26).  C2-C3: Interbody and posterior element ankylosis has developed since 2013 and is solid.  C3-C4: Solid interbody and posterior element ankylosis has developed since 2013. Chronic bulky endplate spurring, most pronounced anteriorly. Chronic spinal and foraminal  stenosis at this level appears not significantly changed.  C4-C5: Severe disc space loss with bulky circumferential disc osteophyte complex and moderate to severe bilateral facet hypertrophy. Interbody and facet vacuum phenomena. Moderate to severe bilateral C5 foraminal stenosis. Mild spinal stenosis suspected at this level, similar to that at C3-C4.  C5-C6:  Chronic solid ankylosis or arthrodesis.  Stable since 2013.  C6-C7:  Chronic solid arthrodesis.  Stable since 2013.  C7-T1: Progressed and solid interbody and posterior element arthrodesis with stable bilateral posterior laminar fusion hardware in place.  T1-T2: Moderate facet hypertrophy and anterior eccentric disc osteophyte complex. Probable mild spinal stenosis and moderate bilateral T1 foraminal stenosis.  Upper chest: Intact visible upper thoracic levels. Negative lung apices. Negative noncontrast thoracic inlet.  Other: Visualized paranasal sinuses and mastoids are well pneumatized.  IMPRESSION: 1. Progressed cervical spinal ankylosis/arthrodesis since 2013. There is now solid ankylosis except at the C1-C2 and C4-C5 levels. 2. Severe C4-C5 disc and facet degeneration with suspected mild spinal stenosis and moderate to severe bilateral C5 foraminal stenosis. 3. Progressed and up to severe joint degeneration at the right occiput-C1 and C1-C2 joint spaces. 4. 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   Electronically Signed   By: Genevie Ann M.D.   On: 02/14/2018 14:07    Cervical CT w/wo contrast:  Results for orders placed during the hospital encounter of 07/28/09  CT Cervical Spine W Wo Contrast   Narrative Clinical Data: Severe neck pain and right arm pain.  Follow-up fusion.  C3-C4 degenerative disc disease.   CT CERVICAL SPINE WITHOUT AND WITH CONTRAST   Technique:  Multidetector CT imaging of the cervical spine was performed without and with contrast. Multiplanar CT  image reconstructions were also generated   Contrast: 100 ml Omnipaque-300.   Comparison: Intraoperative radiograph 05/30/2009.  Cervical spine MRI 04/24/2009.   Findings: Completed fusion is present at the vertebral bodies of C5- C7.  There has been  posterior instrumentation at C7-T1.  3 mm of anterolisthesis of C7 on T1 remains unchanged.  Posterolateral morcellated bone graft.  Rod and screw fixation is present at C7- T1.  The right pedicle screw extends into the inferior articular process of C7, approaching the facet joint surface.  The left C7 screw extends into the facet joint space.  The T1 screw on the right is positioned within the pars interarticularis.  Believe left posterior screw protrudes into the superior aspect of the left T1- T2 neural foramen.  There is no evidence of hardware failure or loosening.  C3-C4 cervical spondylosis is present with 1 mm retrolisthesis.  Disc osteophyte complexes are present at C3-C4 and C4-C5.  2 mm anterolisthesis of C3 on C4.  Atlantodental degenerative disease.  Craniocervical alignment normal.   After contrast administration, there is no abnormal enhancement in the operative bed or fluid collection identified.  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  Bilateral pleural apical thickening is noted in the lungs.   C2-C3:  Left foraminal stenosis associated with severe facet hypertrophy.  Mild right foraminal stenosis due to uncovertebral spurring.   C3-C4:  Broad-based disc osteophyte complex with moderate central stenosis.  Left greater than right foraminal stenosis from uncovertebral spurring.   C4-C5:  Symmetric bilateral foraminal stenosis associated with uncovertebral spurring and facet arthrosis.   C7-T1:  Mild to moderate bony central stenosis.  Foramina appear adequately patent.   IMPRESSION:   1.  C7-T1 posterior rod and pedicle screw fixation, with  hardware as described above.  No evidence of hardware failure.  Unchanged retrolisthesis of C7 on T1.  Posterolateral morcellated bone graft. 2.  Upper cervical spondylosis as described above. 3.  No abnormal enhancing fluid collections in the operative bed.  Provider: Coralee Pesa   Cervical CT w contrast:  Results for orders placed during the hospital encounter of 09/21/07  CT Cervical Spine W Contrast   Narrative Clinical Data:  68 year old male status post anterior decompression of the cervical spine at C3-4 and C4-5 with recurrent pain in the left shoulder.  Dysphagia.   MYELOGRAM INJECTION   Technique:  Informed consent was obtained from the patient prior to the procedure, including potential complications of headache, allergy, infection and pain.  A timeout procedure was performed. With the patient prone, the lower back was prepped with Betadine. 1% Lidocaine was used for local anesthesia.  Lumbar puncture was performed at the left paramidline L3-4level using a 22 gauge needle with return of clear CSF.  10cc of Omnipaque 300was injected into the subarachnoid space .   IMPRESSION: Successful injection of  intrathecal contrast for myelography.   MYELOGRAM CERVICAL AND THORACIC   Technique: ]Following injection of intrathecal Omnipaque contrast, spine imaging in multiple projections was performed using fluoroscopy.   Comparison: MRI cervical spine 09/05/2007   Findings: Conventional myelographic images demonstrate a near total block of contrast at the level of T2-3.  Very little contrast is seen above this level.  The thoracic spine is unremarkable below this level.  Anterior cervical fusion hardware is present at C6-7 lateral view does shows some contrast layering along the ventral surface.  There is an extradural defect at C3-4 which appears to be a disc osteophyte complex.  Details at cervicothoracic junction are limited.   Fluoroscopy Time: 2.07 minutes    IMPRESSION:   1.  Near total block of contrast at the T2 level.  Please see the CT report below.   CT MYELOGRAPHY  CERVICAL SPINE   Technique:  CT imaging of the cervical spine was performed after intrathecal contrast administration. Multiplanar CT image reconstructions were also generated.   Findings:  There is anterior fusion hardware at the C6-7 level. The patient has previous mature bony fusion at C5-6.  There is significant lucency surrounding both screws, particularly the left screw at C6.  There is circumferential lucency about the bone graft material at this C6-7 level.  There is lucency along the superior surface of the C7 screws.  There is significant soft tissue posterior to diffuse level.  This measures 6 mm at the disc level. The canal is narrowed to 6 mm at this level as well.  Slight anterolisthesis at C7-T1 is evident.  The soft tissue behind C6-7 is significantly increased from prior MRI scan.   C2-3: Mild left foraminal narrowing is due to advanced facet hypertrophy and mild uncovertebral spurring.   C3-4: The diffuse disc osteophyte complex is present.  This results in mild moderate central canal stenosis biforaminal narrowing is worse left than right.  The central canal is narrowed to just under 10 mm.   C4-5: A diffuse disc osteophyte complex is present.  This effaces the ventral CSF without clearly contacting the cord.  Moderate right and mild left foraminal narrowing is due to uncovertebral spurring and asymmetric right-sided facet hypertrophy.   C5-6: This is the fused level.  An osteophyte and left paramidline position effaces the CSF without clearly contacting the cord. There is no significant stenosis.   C6-7: Please see the above discussion regarding the lucency surrounding the screws, bone graft material, and posterior epidural soft tissues.  The right-sided screw at C6 appears to be mostly backed out of the bone and is backed out of the fusion  plate by 2 mm.  The osseous foraminal narrowing is worse right than left.   C7-T1: The ventral soft tissue is still seen at the C7 level. Slight anterolisthesis measures 3 mm   There is no significant inferior extension of the ventral soft tissue beyond the C7-T1 level.   IMPRESSION:   1.  Extensive lucency surrounding the hardware at C6 with backing out of the right-sided screw concerning for infection. 2.  Lucency surrounding the bone graft material C6-7 level.  This is also concerning for infection. 3.  Significant increase in ventral epidural soft tissue mass narrowing the central canal at the C6-7 level to 6 mm.  This may representing epidural abscess. 4.  Previous mature fusion at C5-6. 5.  Multilevel spondylosis as described above.   CT MYELOGRAPHY THORACIC SPINE   Technique: CT imaging of the thoracic spine was performed after intrathecal contrast administration.  Multiplanar CT image reconstructions were also generated.   Findings:  The thoracic spine is imaged from C6-L1.  Contrast is somewhat dilute in the upper thoracic spine due to pooling posteriorly with the patient's kyphosis.  On the cervical spine CT, contrast at the T1 level was within normal limits.  There is a remote compression fracture with kyphotic angulation at to T3. Endplate Schmorl's nodes are evident at T5-6, T6-7 and T7-8. Additional Schmorl's nodes are seen throughout the lower thoracic spine.  A large hemangioma is evident at T12, measuring 18 mm maximally.  Mild disc bulges are present at T10-11 and also at T11- 12.  There is partial effacement ventral CSF without significant stenosis at either level.  Minimal disc bulging is present T7-8. The foramina appear patent bilaterally.   IMPRESSION:   1.  No  significant inferior extension of the cervical epidural disease. 2.  Remote compression fracture of T3. 3.  Multiple Schmorl's nodes as described. 4.  Minimal disc bulges at T11-12 and  T10-11 without significant stenosis at either level.  Provider: Mary Siler   Cervical DG 1 view:  Results for orders placed during the hospital encounter of 09/22/07  DG Cervical Spine 1 View   Narrative Clinical Data: Removal of cervical hardware.   CERVICAL SPINE - 1 VIEW   Comparison: 09/22/2007   Findings: Metallic hardware within C6 and C7 has been removed. There is anatomic alignment through C6.  C7 is partially obscured. Degenerative changes from C3-C5 are present with large anterior osteophytes.   IMPRESSION: Removal of C6 and C7 metallic hardware.  Provider: Illa Level   Cervical DG 2-3 views:  Results for orders placed during the hospital encounter of 02/14/18  DG Cervical Spine 2 or 3 views   Narrative CLINICAL DATA:  Cervical spondylosis.  EXAM: CERVICAL SPINE - 2-3 VIEW  COMPARISON:  CT 12/28/2011.  FINDINGS: Diffuse multilevel degenerative change with prior multilevel bony fusion and posterior fusion again noted at C7-T1. Hardware intact. Anatomic alignment. No acute bony abnormality. Pulmonary apices are clear. Carotid vascular calcification.  IMPRESSION: 1. Diffuse multilevel degenerative change with prior multilevel bony fusion and posterior fusion again noted at C7-T1. Hardware intact. Anatomic alignment. No acute bony abnormality.  2.  Carotid vascular disease.   Electronically Signed   By: Marcello Moores  Register   On: 02/14/2018 13:40    Shoulder Imaging: Gaston Islam DG:  Results for orders placed during the hospital encounter of 10/24/15  DG Shoulder Right   Narrative CLINICAL DATA:  Pain after fall 6 weeks ago  EXAM: RIGHT SHOULDER - 2+ VIEW  COMPARISON:  None.  FINDINGS: Degenerative changes in the [facility] joint.  No fracture or dislocation.  IMPRESSION: Degenerative changes.  No fractures.   Electronically Signed   By: Dorise Bullion III M.D   On: 10/24/2015 16:24    Thoracic Imaging: Thoracic MR w/wo contrast:  Results  for orders placed during the hospital encounter of 09/22/07  MR Thoracic Spine W Wo Contrast   Narrative Clinical Data:  Prior cervical fusion 2 months ago.  Now with dysphagia and possible infection on the CT myelogram of 09/21/2007.   MRI CERVICAL AND THORACIC SPINE WITHOUT CONTRAST   Technique:  Multiplanar and multiecho pulse sequences of the cervical spine, to include the craniocervical junction and cervicothoracic junction, and the thoracic spine, were obtained according to standard protocol without intravenous contrast.   Comparison: CT myelogram 09/21/2007   MRI CERVICAL SPINE   Findings:  15 ml Multihance   There is multilevel advanced disc degeneration and spondylosis. There is a solid-appearing fusion at C5-6.  There is been anterior plate fusion of I9-C7 on 07/21/2007.  There is extensive edema and enhancement throughout the bone marrow of C5,C6 and C7 compatible with infection.  There is thickening and enhancement of the ventral epidural tissues. There is fluid extending around the hardware through the disc space to the ventral epidural space compatible with early ventral epidural abscess. There is moderate spinal stenosis with some compression of the cord at this level.  The canal is narrowed to 7 mm in diameter.  There may be mild edema in the spinal cord at C5, C6, and C7. There is extensive prevertebral soft tissue swelling and enhancement compatible with infection.  In addition there appears to be an abscess surrounding the hardware at C6-7 and extending  in the prevertebral tissues in the midline measuring approximately 16 x 25 mm.  Based on the CT myelogram the hardware has backed out of the vertebral bodies and there is lucency around the screws compatible with infection.   C2-3 mild disc degeneration   C3-4:  Moderate to advanced disc degeneration and spondylosis. There is diffuse uncinate spurring with moderate spinal stenosis. The canal is narrowed to  7.4 mm and there is considerable foraminal narrowing bilaterally.   C4-5:  There is moderate to severe disc degeneration and spondylosis with diffuse uncinate spurring and moderate spinal stenosis with the canal measured to 8.6 mm.  There is bilateral facet arthropathy and by foraminal narrowing.   C5-6:  Prior interbody fusion.  There is enhancement of the vertebral body of C5 which may be reactive edema or possibly osteomyelitis which has spread from C6-7.  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 Early ventral epidural abscess is present due to direct extension from the disc space infection.  Please see above description.   C7-T1 there is approximate 3 mm of anterior slip with disc degeneration and disc bulging.   IMPRESSION: There has been recent anterior discectomy and fusion with hardware at C6-7.  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  There is ventral epidural enhancement with early epidural abscess and moderate spinal stenosis with some mild cord compression at this level.   Multilevel disc degeneration and spondylosis most prominent at C3-4 and C4-5.   MRI THORACIC SPINE   Findings: The thoracic alignment is normal.  There is a chronic compression fracture T3 which appears benign.  Negative for acute fracture.  There is some prevertebral soft tissue swelling extending down to T3 from the infection in the cervical spine as described in the above report.  No evidence of diskitis or osteomyelitis in the thoracic spine.  The cord has normal signal and there is no cord compression.  There is a large hemangioma in the T12 vertebral body.   Disc degeneration with disc bulging and mild spurring are present at T3-4, T4-5, T7-8, T10-11, and T11-T12.  These areas did not show any  cord compression.   IMPRESSION: Negative for diskitis or osteomyelitis in the thoracic spine. Multilevel disc degeneration and spondylosis without significant spinal stenosis.   Critical test results telephoned to Dr. Deirdre Peer at the time of interpretation on date 09/23/2007 at time 10:05 hours. These results were repeated back to the interpreting radiologist for verification.  Provider: Lynelle Doctor   Thoracic CT w contrast:  Results for orders placed during the hospital encounter of 09/21/07  CT Thoracic Spine W Contrast   Narrative Clinical Data:  68 year old male status post anterior decompression of the cervical spine at C3-4 and C4-5 with recurrent pain in the left shoulder.  Dysphagia.   MYELOGRAM INJECTION   Technique:  Informed consent was obtained from the patient prior to the procedure, including potential complications of headache, allergy, infection and pain.  A timeout procedure was performed. With the patient prone, the lower back was prepped with Betadine. 1% Lidocaine was used for local anesthesia.  Lumbar puncture was performed at the left paramidline L3-4level using a 22 gauge needle with return of clear CSF.  10cc of Omnipaque 300was injected into the subarachnoid space .   IMPRESSION: Successful injection of  intrathecal contrast for myelography.   MYELOGRAM CERVICAL  AND THORACIC   Technique: ]Following injection of intrathecal Omnipaque contrast, spine imaging in multiple projections was performed using fluoroscopy.   Comparison: MRI cervical spine 09/05/2007   Findings: Conventional myelographic images demonstrate a near total block of contrast at the level of T2-3.  Very little contrast is seen above this level.  The thoracic spine is unremarkable below this level.  Anterior cervical fusion hardware is present at C6-7 lateral view does shows some contrast layering along the ventral surface.  There is an extradural defect at C3-4 which appears  to be a disc osteophyte complex.  Details at cervicothoracic junction are limited.   Fluoroscopy Time: 2.07 minutes   IMPRESSION:   1.  Near total block of contrast at the T2 level.  Please see the CT report below.   CT MYELOGRAPHY CERVICAL SPINE   Technique:  CT imaging of the cervical spine was performed after intrathecal contrast administration. Multiplanar CT image reconstructions were also generated.   Findings:  There is anterior fusion hardware at the C6-7 level. The patient has previous mature bony fusion at C5-6.  There is significant lucency surrounding both screws, particularly the left screw at C6.  There is circumferential lucency about the bone graft material at this C6-7 level.  There is lucency along the superior surface of the C7 screws.  There is significant soft tissue posterior to diffuse level.  This measures 6 mm at the disc level. The canal is narrowed to 6 mm at this level as well.  Slight anterolisthesis at C7-T1 is evident.  The soft tissue behind C6-7 is significantly increased from prior MRI scan.   C2-3: Mild left foraminal narrowing is due to advanced facet hypertrophy and mild uncovertebral spurring.   C3-4: The diffuse disc osteophyte complex is present.  This results in mild moderate central canal stenosis biforaminal narrowing is worse left than right.  The central canal is narrowed to just under 10 mm.   C4-5: A diffuse disc osteophyte complex is present.  This effaces the ventral CSF without clearly contacting the cord.  Moderate right and mild left foraminal narrowing is due to uncovertebral spurring and asymmetric right-sided facet hypertrophy.   C5-6: This is the fused level.  An osteophyte and left paramidline position effaces the CSF without clearly contacting the cord. There is no significant stenosis.   C6-7: Please see the above discussion regarding the lucency surrounding the screws, bone graft material, and posterior  epidural soft tissues.  The right-sided screw at C6 appears to be mostly backed out of the bone and is backed out of the fusion plate by 2 mm.  The osseous foraminal narrowing is worse right than left.   C7-T1: The ventral soft tissue is still seen at the C7 level. Slight anterolisthesis measures 3 mm   There is no significant inferior extension of the ventral soft tissue beyond the C7-T1 level.   IMPRESSION:   1.  Extensive lucency surrounding the hardware at C6 with backing out of the right-sided screw concerning for infection. 2.  Lucency surrounding the bone graft material C6-7 level.  This is also concerning for infection. 3.  Significant increase in ventral epidural soft tissue mass narrowing the central canal at the C6-7 level to 6 mm.  This may representing epidural abscess. 4.  Previous mature fusion at C5-6. 5.  Multilevel spondylosis as described above.   CT MYELOGRAPHY THORACIC SPINE   Technique: CT imaging of the thoracic spine was performed after intrathecal contrast administration.  Multiplanar CT  image reconstructions were also generated.   Findings:  The thoracic spine is imaged from C6-L1.  Contrast is somewhat dilute in the upper thoracic spine due to pooling posteriorly with the patient's kyphosis.  On the cervical spine CT, contrast at the T1 level was within normal limits.  There is a remote compression fracture with kyphotic angulation at to T3. Endplate Schmorl's nodes are evident at T5-6, T6-7 and T7-8. Additional Schmorl's nodes are seen throughout the lower thoracic spine.  A large hemangioma is evident at T12, measuring 18 mm maximally.  Mild disc bulges are present at T10-11 and also at T11- 12.  There is partial effacement ventral CSF without significant stenosis at either level.  Minimal disc bulging is present T7-8. The foramina appear patent bilaterally.   IMPRESSION:   1.  No significant inferior extension of the cervical  epidural disease. 2.  Remote compression fracture of T3. 3.  Multiple Schmorl's nodes as described. 4.  Minimal disc bulges at T11-12 and T10-11 without significant stenosis at either level.  Provider: Mary Siler   Complexity Note: Imaging results reviewed. Results shared with Mike Farley, using Layman's terms.                         Meds   Current Outpatient Medications:  Marland Kitchen  Multiple Vitamin (MULTIVITAMIN) tablet, Take 1 tablet by mouth daily., Disp: , Rfl:  .  omeprazole (PRILOSEC) 20 MG capsule, Take 1 capsule (20 mg total) by mouth daily., Disp: 30 capsule, Rfl: 3 .  ondansetron (ZOFRAN) 4 MG tablet, Take 4 mg by mouth every 8 (eight) hours as needed for nausea or vomiting., Disp: , Rfl:  .  Oxycodone HCl 20 MG TABS, Take 20 mg by mouth 3 (three) times daily. Per patient, Disp: , Rfl:  .  Pseudoephedrine HCl (NASAL DECONGESTANT PO), Take by mouth., Disp: , Rfl:  .  QUEtiapine (SEROQUEL) 300 MG tablet, Take 1 tablet (300 mg total) by mouth at bedtime., Disp: 30 tablet, Rfl: 1 .  rosuvastatin (CRESTOR) 10 MG tablet, TAKE ONE TABLET BY MOUTH EVERY DAY, Disp: 30 tablet, Rfl: 1 .  sucralfate (CARAFATE) 1 g tablet, Take 1 g by mouth 4 (four) times daily -  with meals and at bedtime., Disp: , Rfl:  .  valACYclovir (VALTREX) 500 MG tablet, Take 1 tablet (500 mg total) by mouth 2 (two) times daily., Disp: 20 tablet, Rfl: 0 .  calcium carbonate (CALCIUM 600) 600 MG TABS tablet, Take 1 tablet (600 mg total) by mouth 2 (two) times daily with a meal for 30 days., Disp: 60 tablet, Rfl: 0 .  Cholecalciferol (VITAMIN D3) 125 MCG (5000 UT) CAPS, Take 1 capsule (5,000 Units total) by mouth daily with breakfast. Take along with calcium and magnesium., Disp: 30 capsule, Rfl: 5 .  ergocalciferol (VITAMIN D2) 1.25 MG (50000 UT) capsule, Take 1 capsule (50,000 Units total) by mouth 2 (two) times a week. X 6 weeks., Disp: 12 capsule, Rfl: 0 .  Magnesium 500 MG CAPS, Take 1 capsule (500 mg total) by mouth  2 (two) times daily at 8 am and 10 pm., Disp: 60 capsule, Rfl: 5  ROS  Constitutional: Denies any fever or chills Gastrointestinal: No reported hemesis, hematochezia, vomiting, or acute GI distress Musculoskeletal: Denies any acute onset joint swelling, redness, loss of ROM, or weakness Neurological: No reported episodes of acute onset apraxia, aphasia, dysarthria, agnosia, amnesia, paralysis, loss of coordination, or loss of consciousness  Allergies  Mike Farley is allergic to morphine and amoxicillin.  PFSH  Drug: Mike Farley  has no history on file for drug. Alcohol:  reports current alcohol use. Tobacco:  reports that he has quit smoking. His smokeless tobacco use includes chew. Medical:  has a past medical history of Cervical pain (neck), Chronic pain, and Depression. Surgical: Mike Farley  has a past surgical history that includes Cholecystectomy; Appendectomy; Tonsillectomy and adenoidectomy; Total knee arthroplasty (Right, 2005); Elbow fracture surgery (Left, 2004); Cervical discectomy (2008); Anterior fusion cervical spine (2011); Shoulder surgery (Right); and Bone excision (Left, 09/28/2017). Family: family history includes Alcohol abuse in his brother; Arthritis in his mother; Cancer in his father and mother; Kidney disease in his brother; Mental illness in his mother.  Constitutional Exam  General appearance: Well nourished, well developed, and well hydrated. In no apparent acute distress Vitals:   07/10/18 1022  BP: 114/80  Pulse: 80  Resp: 16  Temp: 98.6 F (37 C)  TempSrc: Oral  SpO2: 98%  Weight: 158 lb (71.7 kg)  Height: 5' 10" (1.778 m)   BMI Assessment: Estimated body mass index is 22.67 kg/m as calculated from the following:   Height as of this encounter: 5' 10" (1.778 m).   Weight as of this encounter: 158 lb (71.7 kg).  BMI interpretation table: BMI level Category Range association with higher incidence of chronic pain  <18 kg/m2 Underweight    18.5-24.9 kg/m2 Ideal body weight   25-29.9 kg/m2 Overweight Increased incidence by 20%  30-34.9 kg/m2 Obese (Class I) Increased incidence by 68%  35-39.9 kg/m2 Severe obesity (Class II) Increased incidence by 136%  >40 kg/m2 Extreme obesity (Class III) Increased incidence by 254%   Patient's current BMI Ideal Body weight  Body mass index is 22.67 kg/m. Ideal body weight: 73 kg (160 lb 15 oz)   BMI Readings from Last 4 Encounters:  07/10/18 22.67 kg/m  06/20/18 23.33 kg/m  05/30/18 25.61 kg/m  04/05/18 25.76 kg/m   Wt Readings from Last 4 Encounters:  07/10/18 158 lb (71.7 kg)  06/20/18 158 lb (71.7 kg)  05/30/18 168 lb 6.4 oz (76.4 kg)  04/05/18 169 lb 6.4 oz (76.8 kg)  Psych/Mental status: Alert, oriented x 3 (person, place, & time)       Eyes: PERLA Respiratory: No evidence of acute respiratory distress  Cervical Spine Area Exam  Skin & Axial Inspection: No masses, redness, edema, swelling, or associated skin lesions Alignment: Symmetrical Functional ROM: Decreased ROM      Stability: No instability detected Muscle Tone/Strength: Guarding observed Sensory (Neurological): Movement-associated pain Palpation: No palpable anomalies              Upper Extremity (UE) Exam    Side: Right upper extremity  Side: Left upper extremity  Skin & Extremity Inspection: Skin color, temperature, and hair growth are WNL. No peripheral edema or cyanosis. No masses, redness, swelling, asymmetry, or associated skin lesions. No contractures.  Skin & Extremity Inspection: Skin color, temperature, and hair growth are WNL. No peripheral edema or cyanosis. No masses, redness, swelling, asymmetry, or associated skin lesions. No contractures.  Functional ROM: Unrestricted ROM          Functional ROM: Unrestricted ROM          Muscle Tone/Strength: Functionally intact. No obvious neuro-muscular anomalies detected.  Muscle Tone/Strength: Functionally intact. No obvious neuro-muscular anomalies  detected.  Sensory (Neurological): Unimpaired          Sensory (Neurological): Unimpaired  Palpation: No palpable anomalies              Palpation: No palpable anomalies              Provocative Test(s):  Phalen's test: deferred Tinel's test: deferred Apley's scratch test (touch opposite shoulder):  Action 1 (Across chest): deferred Action 2 (Overhead): deferred Action 3 (LB reach): deferred   Provocative Test(s):  Phalen's test: deferred Tinel's test: deferred Apley's scratch test (touch opposite shoulder):  Action 1 (Across chest): deferred Action 2 (Overhead): deferred Action 3 (LB reach): deferred    Thoracic Spine Area Exam  Skin & Axial Inspection: No masses, redness, or swelling Alignment: Symmetrical Functional ROM: Unrestricted ROM Stability: No instability detected Muscle Tone/Strength: Functionally intact. No obvious neuro-muscular anomalies detected. Sensory (Neurological): Unimpaired Muscle strength & Tone: No palpable anomalies  Lumbar Spine Area Exam  Skin & Axial Inspection: No masses, redness, or swelling Alignment: Symmetrical Functional ROM: Unrestricted ROM       Stability: No instability detected Muscle Tone/Strength: Functionally intact. No obvious neuro-muscular anomalies detected. Sensory (Neurological): Unimpaired Palpation: No palpable anomalies       Provocative Tests: Hyperextension/rotation test: deferred today       Lumbar quadrant test (Kemp's test): deferred today       Lateral bending test: deferred today       Patrick's Maneuver: deferred today                   FABER* test: deferred today                   S-I anterior distraction/compression test: deferred today         S-I lateral compression test: deferred today         S-I Thigh-thrust test: deferred today         S-I Gaenslen's test: deferred today         *(Flexion, ABduction and External Rotation)  Gait & Posture Assessment  Ambulation: Unassisted Gait: Relatively  normal for age and body habitus Posture: WNL   Lower Extremity Exam    Side: Right lower extremity  Side: Left lower extremity  Stability: No instability observed          Stability: No instability observed          Skin & Extremity Inspection: Skin color, temperature, and hair growth are WNL. No peripheral edema or cyanosis. No masses, redness, swelling, asymmetry, or associated skin lesions. No contractures.  Skin & Extremity Inspection: Skin color, temperature, and hair growth are WNL. No peripheral edema or cyanosis. No masses, redness, swelling, asymmetry, or associated skin lesions. No contractures.  Functional ROM: Unrestricted ROM                  Functional ROM: Unrestricted ROM                  Muscle Tone/Strength: Functionally intact. No obvious neuro-muscular anomalies detected.  Muscle Tone/Strength: Functionally intact. No obvious neuro-muscular anomalies detected.  Sensory (Neurological): Unimpaired        Sensory (Neurological): Unimpaired        DTR: Patellar: deferred today Achilles: deferred today Plantar: deferred today  DTR: Patellar: deferred today Achilles: deferred today Plantar: deferred today  Palpation: No palpable anomalies  Palpation: No palpable anomalies   Assessment & Plan  Primary Diagnosis & Pertinent Problem List: The primary encounter diagnosis was Chronic pain syndrome. 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  Visit Diagnosis: 1. Chronic pain syndrome   2. Chronic neck pain (Primary Area of Pain)   3. History of cervical spinal surgery   4. Chronic neck pain with history of cervical spinal surgery   5. 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 Cervicogenic headache (Secondary Area of Pain) (Right)   8. Occipital neuralgia (Right)   9. Chronic shoulder pain (Right)   10. History of repair of rotator cuff (Right)   11. Vitamin D insufficiency   12. Abnormal CT scan, cervical spine (02/14/2018)   13. 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 Plan of Care  Pharmacotherapy (Medications Ordered): Meds ordered this encounter  Medications  . ergocalciferol (VITAMIN D2) 1.25 MG (50000 UT) capsule    Sig: Take 1 capsule (50,000 Units total) by mouth 2 (two) times a week. X 6 weeks.    Dispense:  12 capsule    Refill:  0    Do not add this medication to the electronic "Automatic Refill" notification system. Patient may have prescription filled one day early if pharmacy is closed on scheduled refill date.  . Cholecalciferol (VITAMIN D3) 125 MCG (5000 UT) CAPS    Sig: Take 1 capsule (5,000 Units total) by mouth daily with breakfast. Take along with calcium and magnesium.    Dispense:  30 capsule    Refill:  5    Do not place medication on "Automatic Refill".  May substitute with similar over-the-counter product.  . Magnesium 500 MG CAPS    Sig: Take 1 capsule (500 mg total) by mouth 2 (two) times daily at 8 am and 10 pm.    Dispense:  60 capsule    Refill:  5    Do not place medication on "Automatic Refill".  The patient may use similar over-the-counter product.  . calcium carbonate (CALCIUM 600) 600 MG TABS tablet    Sig: Take 1 tablet (600 mg total) by mouth 2 (two) times daily with a meal for  30 days.    Dispense:  60 tablet    Refill:  0    Do not place medication on "Automatic Refill". Fill one day early if pharmacy is closed on scheduled refill date.    Procedure Orders     Small Joint Injection/Arthrocentesis Lab Orders  No laboratory test(s) ordered today   Imaging Orders  No imaging studies ordered today   Referral Orders  No referral(s) requested today   Pharmacological management options:  Opioid Analgesics: We'll take over management today. See above orders Membrane stabilizer: We have discussed the possibility of optimizing this mode of therapy, if tolerated Muscle relaxant: We have discussed the possibility of a trial NSAID: We have discussed the possibility of a trial Other analgesic(s): To be determined at a later time   Interventional management options: Planned, scheduled, and/or pending:    We will be to lowering his opioids by 5 mg/week to the point  where a 3 to 4-week "Drug Holiday" can be accomplished.  He claims to be taking 220 mg/day of oxycodone.  Starting at this level it will take [place] 44 weeks to get to 0 mg/day.  Since he still has medication at home, he was instructed to bring it in so that we can dispose of anything that it is old or that we do not want him to be taking.  He has agreed and he indicated that he will be bringing it back on his next visit. Diagnostic right-sided atlanto-axial & Atlanto-occipital joint block #1 under fluoroscopic guidance and IV sedation   Considering:   Diagnostic right-sided occipital nerve block  Diagnostic right-sided cervical facet nerve block  Possible right-sided occipital nerve RFA    PRN Procedures:   None at this time   Provider-requested follow-up: Return for Procedure (w/ sedation): (R) AA & AO Joint inj. #1.  Future Appointments  Date Time Provider [place]  07/20/2018  9:00 AM Einar Pheasant, MD [facility]  10/10/2018 10:30 AM Einar Pheasant, MD [facility]  10/10/2018 11:00 AM  O'Brien-Blaney, Bryson Corona, LPN [facility]    Primary Care Physician: Einar Pheasant, MD Location: [facility] Note by: Gaspar Cola, MD Date: 07/10/2018; Time: 8:10 AM

## 2018-07-10 ENCOUNTER — Encounter: Payer: Self-pay | Admitting: Pain Medicine

## 2018-07-10 ENCOUNTER — Other Ambulatory Visit: Payer: Self-pay

## 2018-07-10 ENCOUNTER — Ambulatory Visit: Payer: Medicare Other | Attending: Pain Medicine | Admitting: Pain Medicine

## 2018-07-10 VITALS — BP 114/80 | HR 80 | Temp 98.6°F | Resp 16 | Ht 70.0 in | Wt 158.0 lb

## 2018-07-10 DIAGNOSIS — M19011 Primary osteoarthritis, right shoulder: Secondary | ICD-10-CM | POA: Insufficient documentation

## 2018-07-10 DIAGNOSIS — Z9889 Other specified postprocedural states: Secondary | ICD-10-CM

## 2018-07-10 DIAGNOSIS — M542 Cervicalgia: Secondary | ICD-10-CM

## 2018-07-10 DIAGNOSIS — F1190 Opioid use, unspecified, uncomplicated: Secondary | ICD-10-CM

## 2018-07-10 DIAGNOSIS — M5481 Occipital neuralgia: Secondary | ICD-10-CM | POA: Diagnosis present

## 2018-07-10 DIAGNOSIS — M47811 Spondylosis without myelopathy or radiculopathy, occipito-atlanto-axial region: Secondary | ICD-10-CM | POA: Diagnosis present

## 2018-07-10 DIAGNOSIS — G4486 Cervicogenic headache: Secondary | ICD-10-CM

## 2018-07-10 DIAGNOSIS — G8929 Other chronic pain: Secondary | ICD-10-CM | POA: Diagnosis present

## 2018-07-10 DIAGNOSIS — G8928 Other chronic postprocedural pain: Secondary | ICD-10-CM | POA: Insufficient documentation

## 2018-07-10 DIAGNOSIS — R937 Abnormal findings on diagnostic imaging of other parts of musculoskeletal system: Secondary | ICD-10-CM

## 2018-07-10 DIAGNOSIS — R51 Headache: Secondary | ICD-10-CM

## 2018-07-10 DIAGNOSIS — M25511 Pain in right shoulder: Secondary | ICD-10-CM | POA: Diagnosis present

## 2018-07-10 DIAGNOSIS — G894 Chronic pain syndrome: Secondary | ICD-10-CM | POA: Diagnosis present

## 2018-07-10 DIAGNOSIS — E559 Vitamin D deficiency, unspecified: Secondary | ICD-10-CM

## 2018-07-10 MED ORDER — CALCIUM CARBONATE 600 MG PO TABS: 600.0000 mg | ORAL_TABLET | Freq: Two times a day (BID) | ORAL | 0 refills | Status: DC

## 2018-07-10 MED ORDER — ERGOCALCIFEROL 1.25 MG (50000 UT) PO CAPS: 50000.0000 [IU] | ORAL_CAPSULE | ORAL | 0 refills | Status: AC

## 2018-07-10 MED ORDER — MAGNESIUM 500 MG PO CAPS: 500.0000 mg | ORAL_CAPSULE | Freq: Two times a day (BID) | ORAL | 5 refills | Status: AC

## 2018-07-10 MED ORDER — VITAMIN D3 125 MCG (5000 UT) PO CAPS: 1.0000 | ORAL_CAPSULE | Freq: Every day | ORAL | 5 refills | Status: AC

## 2018-07-10 NOTE — Patient Instructions (Addendum)
____________________________________________________________________________________________  Drug Holidays (Slow)  What is a "Drug Holiday"? Drug Holiday: is the name given to the period of time during which a patient stops taking a medication(s) for the purpose of eliminating tolerance to the drug.  Benefits . Improved effectiveness of opioids. . Decreased opioid dose needed to achieve benefits. . Improved pain with lesser dose.  What is tolerance? Tolerance: is the progressive decreased in effectiveness of a drug due to its repetitive use. With repetitive use, the body gets use to the medication and as a consequence, it loses its effectiveness. This is a common problem seen with opioid pain medications. As a result, a larger dose of the drug is needed to achieve the same effect that used to be obtained with a smaller dose.  How long should a "Drug Holiday" last? At least 14 consecutive days. (2 weeks)  What are withdrawals? Withdrawals: refers to the wide range of symptoms that occur after stopping or dramatically reducing opiate drugs after heavy and prolonged use. Withdrawal symptoms do not occur to patients that use low dose opioids, or those who take the medication sporadically. Contrary to benzodiazepine (example: Valium, Xanax, etc.) or alcohol withdrawals ("Delirium Tremens"), opioid withdrawals are not lethal. Withdrawals are the physical manifestation of the body getting rid of the excess receptors.  Expected Symptoms Early symptoms of withdrawal may include: . Agitation . Anxiety . Muscle aches . Increased tearing . Insomnia . Runny nose . Sweating . Yawning  Late symptoms of withdrawal may include: . Abdominal cramping . Diarrhea . Dilated pupils . Goose bumps . Nausea . Vomiting  Will I experience withdrawals? Due to the slow nature of the taper, it is very unlikely that you will experience any.  What is a slow taper? Taper: refers to the gradual decrease in  dose. ___________________________________________________________________________________________  ____________________________________________________________________________________________  Preparing for Procedure with Sedation  Instructions: . Oral Intake: Do not eat or drink anything for at least 8 hours prior to your procedure. . Transportation: Public transportation is not allowed. Bring an adult driver. The driver must be physically present in our waiting room before any procedure can be started. Marland Kitchen Physical Assistance: Bring an adult physically capable of assisting you, in the event you need help. This adult should keep you company at home for at least 6 hours after the procedure. . Blood Pressure Medicine: Take your blood pressure medicine with a sip of water the morning of the procedure. . Blood thinners: Notify our staff if you are taking any blood thinners. Depending on which one you take, there will be specific instructions on how and when to stop it. . Diabetics on insulin: Notify the staff so that you can be scheduled 1st case in the morning. If your diabetes requires high dose insulin, take only  of your normal insulin dose the morning of the procedure and notify the staff that you have done so. . Preventing infections: Shower with an antibacterial soap the morning of your procedure. . Build-up your immune system: Take 1000 mg of Vitamin C with every meal (3 times a day) the day prior to your procedure. Marland Kitchen Antibiotics: Inform the staff if you have a condition or reason that requires you to take antibiotics before dental procedures. . Pregnancy: If you are pregnant, call and cancel the procedure. . Sickness: If you have a cold, fever, or any active infections, call and cancel the procedure. . Arrival: You must be in the facility at least 30 minutes prior to your scheduled procedure. Marland Kitchen  Children: Do not bring children with you. . Dress appropriately: Bring dark clothing that you  would not mind if they get stained. . Valuables: Do not bring any jewelry or valuables.  Procedure appointments are reserved for interventional treatments only. Marland Kitchen No Prescription Refills. . No medication changes will be discussed during procedure appointments. . No disability issues will be discussed.  Reasons to call and reschedule or cancel your procedure: (Following these recommendations will minimize the risk of a serious complication.) . Surgeries: Avoid having procedures within 2 weeks of any surgery. (Avoid for 2 weeks before or after any surgery). . Flu Shots: Avoid having procedures within 2 weeks of a flu shots or . (Avoid for 2 weeks before or after immunizations). . Barium: Avoid having a procedure within 7-10 days after having had a radiological study involving the use of radiological contrast. (Myelograms, Barium swallow or enema study). . Heart attacks: Avoid any elective procedures or surgeries for the initial 6 months after a "Myocardial Infarction" (Heart Attack). . Blood thinners: It is imperative that you stop these medications before procedures. Let us know if you if you take any blood thinner.  . Infection: Avoid procedures during or within two weeks of an infection (including chest colds or gastrointestinal problems). Symptoms associated with infections include: Localized redness, fever, chills, night sweats or profuse sweating, burning sensation when voiding, cough, congestion, stuffiness, runny nose, sore throat, diarrhea, nausea, vomiting, cold or Flu symptoms, recent or current infections. It is specially important if the infection is over the area that we intend to treat. Marland Kitchen Heart and lung problems: Symptoms that may suggest an active cardiopulmonary problem include: cough, chest pain, breathing difficulties or shortness of breath, dizziness, ankle swelling, uncontrolled high or unusually low blood pressure, and/or palpitations. If you are experiencing any of these symptoms,  cancel your procedure and contact your primary care physician for an evaluation.  Remember:  Regular Business hours are:  Monday to Thursday 8:00 AM to 4:00 PM  Provider's Schedule: Milinda Pointer, MD:  Procedure days: Tuesday and Thursday 7:30 AM to 4:00 PM  Gillis Santa, MD:  Procedure days: Monday and Wednesday 7:30 AM to 4:00 PM ____________________________________________________________________________________________   ____________________________________________________________________________________________  Medication Rules  Purpose: To inform patients, and their family members, of our rules and regulations.  Applies to: All patients receiving prescriptions (written or electronic).  Pharmacy of record: Pharmacy where electronic prescriptions will be sent. If written prescriptions are taken to a different pharmacy, please inform the nursing staff. The pharmacy listed in the electronic medical record should be the one where you would like electronic prescriptions to be sent.  Electronic prescriptions: In compliance with the [place] (STOP) Act of 2017 (Session Lanny Cramp [phone]), effective May 24, 2018, all controlled substances must be electronically prescribed. Calling prescriptions to the pharmacy will cease to exist.  Prescription refills: Only during scheduled appointments. Applies to all prescriptions.  NOTE: The following applies primarily to controlled substances (Opioid* Pain Medications).   Patient's responsibilities: 1. Pain Pills: Bring all pain pills to every appointment (except for procedure appointments). 2. Pill Bottles: Bring pills in original pharmacy bottle. Always bring the newest bottle. Bring bottle, even if empty. 3. Medication refills: You are responsible for knowing and keeping track of what medications you take and those you need refilled. The day before your appointment: write a list of all  prescriptions that need to be refilled. The day of the appointment: give the list to the admitting nurse. Prescriptions  will be written only during appointments. No prescriptions will be written on procedure days. If you forget a medication: it will not be "Called in", "Faxed", or "electronically sent". You will need to get another appointment to get these prescribed. No early refills. Do not call asking to have your prescription filled early. 4. Prescription Accuracy: You are responsible for carefully inspecting your prescriptions before leaving our office. Have the discharge nurse carefully go over each prescription with you, before taking them home. Make sure that your name is accurately spelled, that your address is correct. Check the name and dose of your medication to make sure it is accurate. Check the number of pills, and the written instructions to make sure they are clear and accurate. Make sure that you are given enough medication to last until your next medication refill appointment. 5. Taking Medication: Take medication as prescribed. When it comes to controlled substances, taking less pills or less frequently than prescribed is permitted and encouraged. Never take more pills than instructed. Never take medication more frequently than prescribed.  6. Inform other Doctors: Always inform, all of your healthcare providers, of all the medications you take. 7. Pain Medication from other Providers: You are not allowed to accept any additional pain medication from any other Doctor or Healthcare provider. There are two exceptions to this rule. (see below) In the event that you require additional pain medication, you are responsible for notifying us, as stated below. 8. Medication Agreement: You are responsible for carefully reading and following our Medication Agreement. This must be signed before receiving any prescriptions from our practice. Safely store a copy of your signed Agreement. Violations  to the Agreement will result in no further prescriptions. (Additional copies of our Medication Agreement are available upon request.) 9. Laws, Rules, & Regulations: All patients are expected to follow all Federal and [employer], [employer], Rules, [employer]. Ignorance of the Laws does not constitute a valid excuse. The use of any illegal substances is prohibited. 10. Adopted CDC guidelines & recommendations: Target dosing levels will be at or below 60 MME/day. Use of benzodiazepines** is not recommended.  Exceptions: There are only two exceptions to the rule of not receiving pain medications from other Healthcare Providers. 1. Exception #1 (Emergencies): In the event of an emergency (i.e.: accident requiring emergency care), you are allowed to receive additional pain medication. However, you are responsible for: As soon as you are able, call our office [phone], at any time of the day or night, and leave a message stating your name, the date and nature of the emergency, and the name and dose of the medication prescribed. In the event that your call is answered by a member of our staff, make sure to document and save the date, time, and the name of the person that took your information.  2. Exception #2 (Planned Surgery): In the event that you are scheduled by another doctor or dentist to have any type of surgery or procedure, you are allowed (for a period no longer than 30 days), to receive additional pain medication, for the acute post-op pain. However, in this case, you are responsible for picking up a copy of our "Post-op Pain Management for Surgeons" handout, and giving it to your surgeon or dentist. This document is available at our office, and does not require an appointment to obtain it. Simply go to our office during business hours (Monday-Thursday from 8:00 AM to 4:00 PM) (Friday 8:00 AM to 12:00 Noon) or if you  have a scheduled appointment with [place], prior to your surgery, and ask for it by  name. In addition, you will need to provide [place] with your name, name of your surgeon, type of surgery, and date of procedure or surgery.  *Opioid medications include: morphine, codeine, oxycodone, oxymorphone, hydrocodone, hydromorphone, meperidine, tramadol, tapentadol, buprenorphine, fentanyl, methadone. **Benzodiazepine medications include: diazepam (Valium), alprazolam (Xanax), clonazepam (Klonopine), lorazepam (Ativan), clorazepate (Tranxene), chlordiazepoxide (Librium), estazolam (Prosom), oxazepam (Serax), temazepam (Restoril), triazolam (Halcion) (Last updated: 07/21/2017) ____________________________________________________________________________________________   ____________________________________________________________________________________________  Medication Recommendations and Reminders  Applies to: All patients receiving prescriptions (written and/or electronic).  Medication Rules & Regulations: These rules and regulations exist for your safety and that of others. They are not flexible and neither are we. Dismissing or ignoring them will be considered "non-compliance" with medication therapy, resulting in complete and irreversible termination of such therapy. (See document titled "Medication Rules" for more details.) In all conscience, because of safety reasons, we cannot continue providing a therapy where the patient does not follow instructions.  Pharmacy of record:   Definition: This is the pharmacy where your electronic prescriptions will be sent.   We do not endorse any particular pharmacy.  You are not restricted in your choice of pharmacy.  The pharmacy listed in the electronic medical record should be the one where you want electronic prescriptions to be sent.  If you choose to change pharmacy, simply notify our nursing staff of your choice of new pharmacy.  Recommendations:  Keep all of your pain medications in a safe place, under lock and key, even if you  live alone.   After you fill your prescription, take 1 week's worth of pills and put them away in a safe place. You should keep a separate, properly labeled bottle for this purpose. The remainder should be kept in the original bottle. Use this as your primary supply, until it runs out. Once it's gone, then you know that you have 1 week's worth of medicine, and it is time to come in for a prescription refill. If you do this correctly, it is unlikely that you will ever run out of medicine.  To make sure that the above recommendation works, it is very important that you make sure your medication refill appointments are scheduled at least 1 week before you run out of medicine. To do this in an effective manner, make sure that you do not leave the office without scheduling your next medication management appointment. Always ask the nursing staff to show you in your prescription , when your medication will be running out. Then arrange for the receptionist to get you a return appointment, at least 7 days before you run out of medicine. Do not wait until you have 1 or 2 pills left, to come in. This is very poor planning and does not take into consideration that we may need to cancel appointments due to bad weather, sickness, or emergencies affecting our staff.  "Partial Fill": If for any reason your pharmacy does not have enough pills/tablets to completely fill or refill your prescription, do not allow for a "partial fill". You will need a separate prescription to fill the remaining amount, which we will not provide. If the reason for the partial fill is your insurance, you will need to talk to the pharmacist about payment alternatives for the remaining tablets, but again, do not accept a partial fill.  Prescription refills and/or changes in medication(s):   Prescription refills, and/or changes in dose or medication, will be  conducted only during scheduled medication management appointments. (Applies to both,  written and electronic prescriptions.)  No refills on procedure days. No medication will be changed or started on procedure days. No changes, adjustments, and/or refills will be conducted on a procedure day. Doing so will interfere with the diagnostic portion of the procedure.  No phone refills. No medications will be "called into the pharmacy".  No Fax refills.  No weekend refills.  No Holliday refills.  No after hours refills.  Remember:  Business hours are:  Monday to Thursday 8:00 AM to 4:00 PM Provider's Schedule: Dionisio Tyriek, NP - Appointments are:  Medication management: Monday to Thursday 8:00 AM to 4:00 PM Milinda Pointer, MD - Appointments are:  Medication management: Monday and Wednesday 8:00 AM to 4:00 PM Procedure day: Tuesday and Thursday 7:30 AM to 4:00 PM Gillis Santa, MD - Appointments are:  Medication management: Tuesday and Thursday 8:00 AM to 4:00 PM Procedure day: Monday and Wednesday 7:30 AM to 4:00 PM (Last update: 07/21/2017) ____________________________________________________________________________________________   ____________________________________________________________________________________________  CANNABIDIOL (AKA: CBD Oil or Pills)  Applies to: All patients receiving prescriptions of controlled substances (written and/or electronic).  General Information: Cannabidiol (CBD) was discovered in 3. It is one of some 113 identified cannabinoids in cannabis (Marijuana) plants, accounting for up to 40% of the plant's extract. As of 2018, preliminary clinical research on cannabidiol included studies of anxiety, cognition, movement disorders, and pain.  Cannabidiol is consummed in multiple ways, including inhalation of cannabis smoke or vapor, as an aerosol spray into the cheek, and by mouth. It may be supplied as CBD oil containing CBD as the active ingredient (no added tetrahydrocannabinol (THC) or terpenes), a full-plant CBD-dominant hemp  extract oil, capsules, dried cannabis, or as a liquid solution. CBD is thought not have the same psychoactivity as THC, and may affect the actions of THC. Studies suggest that CBD may interact with different biological targets, including cannabinoid receptors and other neurotransmitter receptors. As of 2018 the mechanism of action for its biological effects has not been determined.  In the [place], cannabidiol has a limited approval by the Food and Drug Administration (FDA) for treatment of only two types of epilepsy disorders. The side effects of long-term use of the drug include somnolence, decreased appetite, diarrhea, fatigue, malaise, weakness, sleeping problems, and others.  CBD remains a Schedule I drug prohibited for any use.  Legality: Some manufacturers ship CBD products nationally, an illegal action which the FDA has not enforced in 2018, with CBD remaining the subject of an FDA investigational new drug evaluation, and is not considered legal as a dietary supplement or food ingredient as of December 2018. Federal illegality has made it difficult historically to conduct research on CBD. CBD is openly sold in head shops and health food stores in some states where such sales have not been explicitly legalized.  Warning: Because it is not FDA approved for general use or treatment of pain, it is not required to undergo the same manufacturing controls as prescription drugs.  This means that the available cannabidiol (CBD) may be contaminated with THC.  If this is the case, it will trigger a positive urine drug screen (UDS) test for cannabinoids (Marijuana).  Because a positive UDS for illicit substances is a violation of our medication agreement, your opioid analgesics (pain medicine) may be permanently discontinued. (Last update: 08/11/2017) ____________________________________________________________________________________________  Prescriptions for Calcium, Vitamin D2, Vitamin D2, and  Magnesium were sent to your pharmacy.

## 2018-07-10 NOTE — Progress Notes (Signed)
Safety precautions to be maintained throughout the outpatient stay will include: orient to surroundings, keep bed in low position, maintain call bell within reach at all times, provide assistance with transfer out of bed and ambulation.  

## 2018-07-19 DIAGNOSIS — Z6823 Body mass index (BMI) 23.0-23.9, adult: Secondary | ICD-10-CM | POA: Diagnosis not present

## 2018-07-19 DIAGNOSIS — M5481 Occipital neuralgia: Secondary | ICD-10-CM | POA: Diagnosis not present

## 2018-07-19 DIAGNOSIS — M47812 Spondylosis without myelopathy or radiculopathy, cervical region: Secondary | ICD-10-CM | POA: Diagnosis not present

## 2018-07-20 ENCOUNTER — Ambulatory Visit (INDEPENDENT_AMBULATORY_CARE_PROVIDER_SITE_OTHER): Payer: Medicare Other | Admitting: Internal Medicine

## 2018-07-20 VITALS — BP 116/62 | HR 85 | Temp 97.4°F | Resp 16 | Wt 164.0 lb

## 2018-07-20 DIAGNOSIS — G8929 Other chronic pain: Secondary | ICD-10-CM | POA: Diagnosis not present

## 2018-07-20 DIAGNOSIS — M542 Cervicalgia: Secondary | ICD-10-CM | POA: Diagnosis not present

## 2018-07-20 DIAGNOSIS — E7800 Pure hypercholesterolemia, unspecified: Secondary | ICD-10-CM | POA: Diagnosis not present

## 2018-07-20 DIAGNOSIS — R109 Unspecified abdominal pain: Secondary | ICD-10-CM

## 2018-07-20 DIAGNOSIS — R739 Hyperglycemia, unspecified: Secondary | ICD-10-CM | POA: Diagnosis not present

## 2018-07-20 DIAGNOSIS — D649 Anemia, unspecified: Secondary | ICD-10-CM | POA: Diagnosis not present

## 2018-07-20 DIAGNOSIS — G479 Sleep disorder, unspecified: Secondary | ICD-10-CM

## 2018-07-20 NOTE — Progress Notes (Signed)
Patient ID: Mike Farley, male   DOB: 08/07/1950, 68 y.o.   MRN: [id number]   Subjective:    Patient ID: Mike Farley, male    DOB: 03-22-1951, 68 y.o.   MRN: [id number]  HPI  Patient here for a scheduled follow up.  He is s/p spinal fusion 04/10/18 - C4-C5 posterior fusion.  Saw Dr Carloyn Manner yesterday.  No changes made.  Still dealing with chronic pain.  On oxycodone.  Planning to f/u with Dr Nicholaus Bloom in [place] about this.  Reports Dr Carloyn Manner is going to retire.  Planning to f/u with Dr Deirdre Peer.  Planning for nerve block.  If successful, then will proceed with ablation.  No chest pain.  No sob.  Has been seeing Dr Tiffany Kocher.  On carafate tid and omeprazole bid.  Just had EGD.  Symptoms better. Still with some occasional nausea.  States would take a while for symptoms to resolve.  Is much better.  Has f/u planned with GI 08/24/18.  Off antiinflammatories.  Bowels moving.     Past Medical History:  Diagnosis Date  . Cervical pain (neck)    limited mobility  . Chronic pain   . Depression    years ago (minor)   Past Surgical History:  Procedure Laterality Date  . ANTERIOR FUSION CERVICAL SPINE  2011   4-5 levels  . APPENDECTOMY    . BONE EXCISION Left 09/28/2017   Procedure: BONE EXCISION-TAILORS  EXOSTECTOMY;  Surgeon: Samara Deist, DPM;  Location: [place];  Service: Podiatry;  Laterality: Left;  IVA LOCAL  . CERVICAL DISCECTOMY  2008   posterior  . CHOLECYSTECTOMY    . ELBOW FRACTURE SURGERY Left 2004  . SHOULDER SURGERY Right   . TONSILLECTOMY AND ADENOIDECTOMY    . TOTAL KNEE ARTHROPLASTY Right 2005   x 4   Family History  Problem Relation Age of Onset  . Arthritis Mother   . Cancer Mother        colon & breast cnacer  . Mental illness Mother        Depression & bipolar  . Cancer Father        lung & prostate cancer  . Alcohol abuse Brother   . Kidney disease Brother    Social History   Socioeconomic History  . Marital status: Married    Spouse name: Not on  file  . Number of children: Not on file  . Years of education: Not on file  . Highest education level: Not on file  Occupational History  . Not on file  Social Needs  . Financial resource strain: Not hard at all  . Food insecurity:    Worry: Never true    Inability: Never true  . Transportation needs:    Medical: No    Non-medical: No  Tobacco Use  . Smoking status: Former Research scientist (life sciences)  . Smokeless tobacco: Current User    Types: Chew  Substance and Sexual Activity  . Alcohol use: Yes    Alcohol/week: 0.0 standard drinks    Comment: socially  . Drug use: Not on file  . Sexual activity: Not on file  Lifestyle  . Physical activity:    Days per week: Not on file    Minutes per session: Not on file  . Stress: Not on file  Relationships  . Social connections:    Talks on phone: Not on file    Gets together: Not on file    Attends religious service: Not on file  Active member of club or organization: Not on file    Attends meetings of clubs or organizations: Not on file    Relationship status: Not on file  Other Topics Concern  . Not on file  Social History Narrative  . Not on file    Outpatient Encounter Medications as of 07/20/2018  Medication Sig  . calcium carbonate (CALCIUM 600) 600 MG TABS tablet Take 1 tablet (600 mg total) by mouth 2 (two) times daily with a meal for 30 days.  . Cholecalciferol (VITAMIN D3) 125 MCG (5000 UT) CAPS Take 1 capsule (5,000 Units total) by mouth daily with breakfast. Take along with calcium and magnesium.  . ergocalciferol (VITAMIN D2) 1.25 MG (50000 UT) capsule Take 1 capsule (50,000 Units total) by mouth 2 (two) times a week. X 6 weeks.  . Magnesium 500 MG CAPS Take 1 capsule (500 mg total) by mouth 2 (two) times daily at 8 am and 10 pm.  . Multiple Vitamin (MULTIVITAMIN) tablet Take 1 tablet by mouth daily.  Marland Kitchen omeprazole (PRILOSEC) 20 MG capsule Take 1 capsule (20 mg total) by mouth daily.  . ondansetron (ZOFRAN) 4 MG tablet Take 4 mg by  mouth every 8 (eight) hours as needed for nausea or vomiting.  . Oxycodone HCl 20 MG TABS Take 20 mg by mouth 3 (three) times daily. Per patient  . Pseudoephedrine HCl (NASAL DECONGESTANT PO) Take by mouth.  . QUEtiapine (SEROQUEL) 300 MG tablet Take 1 tablet (300 mg total) by mouth at bedtime.  . rosuvastatin (CRESTOR) 10 MG tablet TAKE ONE TABLET BY MOUTH EVERY DAY  . sucralfate (CARAFATE) 1 g tablet Take 1 g by mouth 4 (four) times daily -  with meals and at bedtime.  . valACYclovir (VALTREX) 500 MG tablet Take 1 tablet (500 mg total) by mouth 2 (two) times daily.   No facility-administered encounter medications on file as of 07/20/2018.     Review of Systems  Constitutional: Negative for appetite change and unexpected weight change.  HENT: Negative for congestion and sinus pressure.   Respiratory: Negative for cough, chest tightness and shortness of breath.   Cardiovascular: Negative for chest pain, palpitations and leg swelling.  Gastrointestinal: Positive for nausea. Negative for diarrhea and vomiting.       Abdominal pain and symptoms improved.    Genitourinary: Negative for difficulty urinating and dysuria.  Musculoskeletal: Negative for myalgias.       Chronic back pain.  Followed by pain clinic.    Skin: Negative for color change and rash.  Neurological: Negative for dizziness and light-headedness.  Psychiatric/Behavioral: Negative for agitation and dysphoric mood.       Objective:    Physical Exam Constitutional:      General: He is not in acute distress.    Appearance: Normal appearance. He is well-developed.  HENT:     Nose: Nose normal. No congestion.     Mouth/Throat:     Pharynx: No oropharyngeal exudate or posterior oropharyngeal erythema.  Cardiovascular:     Rate and Rhythm: Normal rate and regular rhythm.  Pulmonary:     Effort: Pulmonary effort is normal. No respiratory distress.     Breath sounds: Normal breath sounds.  Abdominal:     General: Bowel  sounds are normal.     Palpations: Abdomen is soft.     Tenderness: There is no abdominal tenderness.  Musculoskeletal:        General: No swelling or tenderness.  Skin:    Findings: No  erythema or rash.  Neurological:     Mental Status: He is alert.  Psychiatric:        Mood and Affect: Mood normal.        Behavior: Behavior normal.     BP 116/62   Pulse 85   Temp (!) 97.4 F (36.3 C) (Oral)   Resp 16   Wt 164 lb (74.4 kg)   SpO2 97%   BMI 23.53 kg/m  Wt Readings from Last 3 Encounters:  07/20/18 164 lb (74.4 kg)  07/10/18 158 lb (71.7 kg)  06/20/18 158 lb (71.7 kg)     Lab Results  Component Value Date   WBC 8.4 05/30/2018   HGB 12.7 (L) 05/30/2018   HCT 38.2 (L) 05/30/2018   PLT 246.0 05/30/2018   GLUCOSE 97 06/20/2018   CHOL 163 04/03/2018   TRIG 91.0 04/03/2018   HDL 71.30 04/03/2018   LDLCALC 74 04/03/2018   ALT 13 05/30/2018   AST 27 06/20/2018   NA 141 06/20/2018   K 4.4 06/20/2018   CL 97 06/20/2018   CREATININE 0.87 06/20/2018   BUN 9 06/20/2018   CO2 31 05/30/2018   TSH 4.14 04/03/2018   PSA 2.70 04/03/2018   INR 1.03 05/29/2009   HGBA1C 5.5 04/03/2018    Ct Abdomen Pelvis W Contrast  Result Date: 06/07/2018 CLINICAL DATA:  Upper abdominal pain and severe nausea for 2-3 weeks. Weight loss. Reported evidence of gastritis on upper endoscopy performed yesterday. EXAM: CT ABDOMEN AND PELVIS WITH CONTRAST TECHNIQUE: Multidetector CT imaging of the abdomen and pelvis was performed using the standard protocol following bolus administration of intravenous contrast. CONTRAST:  149m OMNIPAQUE IOHEXOL 300 MG/ML  SOLN COMPARISON:  None. FINDINGS: Lower chest: No significant pulmonary nodules or acute consolidative airspace disease. Small amount of oral contrast in the lower thoracic esophagus. Hepatobiliary: Normal liver size. Scattered subcentimeter hypodense liver lesions in the left liver dome and caudate lobe, too small to characterize, which require no  follow-up unless the patient has risk factors for liver malignancy. Cholecystectomy. Dilated common bile duct measuring 12 mm diameter with smooth distal tapering and no radiopaque choledocholithiasis. Mild diffuse intrahepatic biliary ductal dilatation. Large periampullary duodenal diverticulum. Pancreas: Normal, with no mass or duct dilation. Spleen: Normal size. No mass. Adrenals/Urinary Tract: Normal adrenals. Simple 3.3 cm interpolar left renal cyst. Subcentimeter hypodense renal cortical lesions scattered in the bilateral kidneys, too small to characterize, which require no follow-up. No hydronephrosis. Normal bladder. Stomach/Bowel: Normal non-distended stomach. No disproportionately dilated small bowel loops. No focal small bowel caliber transition. No small bowel wall thickening. Oral contrast transits to the cecum. Normal appendix. Moderate to large colonic stool volume, most prominent in the proximal colon. No large bowel wall thickening, diverticulosis or significant pericolonic fat stranding. Vascular/Lymphatic: Atherosclerotic nonaneurysmal abdominal aorta. Patent portal, splenic, hepatic and renal veins. No pathologically enlarged lymph nodes in the abdomen or pelvis. Reproductive: Mildly enlarged prostate. Other: No pneumoperitoneum, ascites or focal fluid collection. Small fat containing periumbilical hernia. Musculoskeletal: No aggressive appearing focal osseous lesions. Moderate thoracolumbar spondylosis. IMPRESSION: 1. No evidence of bowel obstruction or acute bowel inflammation. Moderate to large colonic stool volume suggests constipation. 2. Cholecystectomy. Intrahepatic and extrahepatic biliary ductal dilatation with 12 mm CBD diameter with smooth distal tapering of the CBD and no radiopaque choledocholithiasis. These bile ducts are mildly prominent for the post cholecystectomy state, although still most likely due to chronic post cholecystectomy effect. Recommend correlation with serum  bilirubin levels. MRI abdomen with MRCP  without and with IV contrast could be obtained for further evaluation as clinically warranted. 3. Oral contrast in the lower thoracic esophagus, suggesting esophageal dysmotility and/or gastroesophageal reflux. 4. Mild prostatomegaly. 5. Small fat containing periumbilical hernia. 6.  Aortic Atherosclerosis (ICD10-I70.0). Electronically Signed   By: Ilona Sorrel M.D.   On: 06/07/2018 14:23       Assessment & Plan:   Problem List Items Addressed This Visit    Abdominal pain    Seeing Dr Tiffany Kocher.  On carafate and prilosec.  Better.  Had EGD and CT as outlined.  Discussed CT.  States GI felt no further w/up warranted at this time.  Has f/u planned in 08/2018.  Better.  Follow.        Relevant Orders   CBC with Differential/Platelet   Hepatic function panel   Basic metabolic panel   Chronic neck pain (Primary Area of Pain) (Chronic)    Chronic neck and back pain.  Has been followed by Dr Carloyn Manner.  Planning to f/u with Dr Deirdre Peer.  Also planning nerve block and if successful - ablation.  On oxycodone.  Planning to f/u with Dr Nicholaus Bloom for pain management.        Hyperglycemia    Low carb diet and exercise.  Follow met b and a1c.        Relevant Orders   Hemoglobin A1c   Sleep difficulties    Remains on seroquel.  Follow.         Other Visit Diagnoses    Anemia, unspecified type    -  Primary   Relevant Orders   Ferritin   Hypercholesterolemia       Relevant Orders   Lipid panel       Einar Pheasant, MD

## 2018-07-22 ENCOUNTER — Encounter: Payer: Self-pay | Admitting: Internal Medicine

## 2018-07-22 DIAGNOSIS — R109 Unspecified abdominal pain: Secondary | ICD-10-CM | POA: Insufficient documentation

## 2018-07-22 NOTE — Assessment & Plan Note (Signed)
Remains on seroquel.  Follow.

## 2018-07-22 NOTE — Assessment & Plan Note (Signed)
Seeing Dr Tiffany Kocher.  On carafate and prilosec.  Better.  Had EGD and CT as outlined.  Discussed CT.  States GI felt no further w/up warranted at this time.  Has f/u planned in 08/2018.  Better.  Follow.

## 2018-07-22 NOTE — Assessment & Plan Note (Signed)
Low carb diet and exercise.  Follow met b and a1c.   

## 2018-07-22 NOTE — Assessment & Plan Note (Signed)
Chronic neck and back pain.  Has been followed by Dr Carloyn Manner.  Planning to f/u with Dr Deirdre Peer.  Also planning nerve block and if successful - ablation.  On oxycodone.  Planning to f/u with Dr Nicholaus Bloom for pain management.

## 2018-07-25 ENCOUNTER — Other Ambulatory Visit: Payer: Self-pay | Admitting: Internal Medicine

## 2018-07-25 ENCOUNTER — Other Ambulatory Visit: Payer: Self-pay | Admitting: Nurse Practitioner

## 2018-07-25 DIAGNOSIS — M5481 Occipital neuralgia: Secondary | ICD-10-CM

## 2018-07-25 NOTE — Telephone Encounter (Signed)
Last OV  07/20/2018  Last refilled 05/31/2018 disp 30 with 1 refill   Sent to PCP for approval

## 2018-08-09 ENCOUNTER — Inpatient Hospital Stay: Admission: RE | Admit: 2018-08-09 | Payer: Medicare Other | Source: Ambulatory Visit

## 2018-08-09 ENCOUNTER — Encounter: Payer: Self-pay | Admitting: Internal Medicine

## 2018-08-10 NOTE — Telephone Encounter (Signed)
Spoke with Mike Farley and completed covid19 screening. He has traveled with his wife to [place] recently. They drove there and did not come in contact with anyone sick. Symptoms started on 3/8-3/9. No fever, chill, body aches, SOB, n/v, diarrhea, headache. He does have a productive cough with dark green phlegm, runny nose, sinus pressure.Symptoms started with sore throat for the first few days but that is now better. He has a friend who is a MD that prescribed Cefdinir for 10 days. Completed course yesterday. Mike Farley stated he felt some better on abx but he has recurrent sinus issues and has never taken that particular abx before and does not feel that infection is cleared. I have scheduled patient to see you tomorrow at 2:00.

## 2018-08-11 ENCOUNTER — Ambulatory Visit (INDEPENDENT_AMBULATORY_CARE_PROVIDER_SITE_OTHER): Payer: Medicare Other | Admitting: Internal Medicine

## 2018-08-11 ENCOUNTER — Other Ambulatory Visit: Payer: Self-pay

## 2018-08-11 DIAGNOSIS — J329 Chronic sinusitis, unspecified: Secondary | ICD-10-CM | POA: Diagnosis not present

## 2018-08-11 MED ORDER — DOXYCYCLINE HYCLATE 100 MG PO TABS: 100.0000 mg | ORAL_TABLET | Freq: Two times a day (BID) | ORAL | 0 refills | Status: DC

## 2018-08-11 NOTE — Patient Instructions (Signed)
nasacort nasal spray - 2 sprays each nostril one time per day.  Do this in the evening.    mucinex in the am and Robitussin DM in the evening.    Take a probiotic daily while you are on the antibiotic and for two weeks after completing the antibiotic.

## 2018-08-11 NOTE — Progress Notes (Signed)
Patient ID: Mike Farley, male   DOB: 1950-09-08, 68 y.o.   MRN: [id number]   Subjective:    Patient ID: Mike Farley, male    DOB: [birth year], 67 y.o.   MRN: [id number]  HPI  Patient here as a work in with concerns regarding persistent sinus congestion and cough.  Started having symptoms 07/30/18.  Received rx for omnicef.  Completed yesterday.  Drove to [place] after symptoms started.  Persistent symptoms.  Increased sinus pressure and nasal congestion.  Increased drainage.  Green mucus production.  Some chest congestion with associated cough.  No fever.  No sore throat now.  No vomiting or diarrhea.  Taking tylenol.  Using afrin.     Past Medical History:  Diagnosis Date  . Cervical pain (neck)    limited mobility  . Chronic pain   . Depression    years ago (minor)   Past Surgical History:  Procedure Laterality Date  . ANTERIOR FUSION CERVICAL SPINE  2011   4-5 levels  . APPENDECTOMY    . BONE EXCISION Left 09/28/2017   Procedure: BONE EXCISION-TAILORS  EXOSTECTOMY;  Surgeon: Samara Deist, DPM;  Location: [place];  Service: Podiatry;  Laterality: Left;  IVA LOCAL  . CERVICAL DISCECTOMY  2008   posterior  . CHOLECYSTECTOMY    . ELBOW FRACTURE SURGERY Left 2004  . SHOULDER SURGERY Right   . TONSILLECTOMY AND ADENOIDECTOMY    . TOTAL KNEE ARTHROPLASTY Right 2005   x 4   Family History  Problem Relation Age of Onset  . Arthritis Mother   . Cancer Mother        colon & breast cnacer  . Mental illness Mother        Depression & bipolar  . Cancer Father        lung & prostate cancer  . Alcohol abuse Brother   . Kidney disease Brother    Social History   Socioeconomic History  . Marital status: Married    Spouse name: Not on file  . Number of children: Not on file  . Years of education: Not on file  . Highest education level: Not on file  Occupational History  . Not on file  Social Needs  . Financial resource strain: Not hard at all  . Food  insecurity:    Worry: Never true    Inability: Never true  . Transportation needs:    Medical: No    Non-medical: No  Tobacco Use  . Smoking status: Former Research scientist (life sciences)  . Smokeless tobacco: Current User    Types: Chew  Substance and Sexual Activity  . Alcohol use: Yes    Alcohol/week: 0.0 standard drinks    Comment: socially  . Drug use: Not on file  . Sexual activity: Not on file  Lifestyle  . Physical activity:    Days per week: Not on file    Minutes per session: Not on file  . Stress: Not on file  Relationships  . Social connections:    Talks on phone: Not on file    Gets together: Not on file    Attends religious service: Not on file    Active member of club or organization: Not on file    Attends meetings of clubs or organizations: Not on file    Relationship status: Not on file  Other Topics Concern  . Not on file  Social History Narrative  . Not on file    Outpatient Encounter Medications as of  08/11/2018  Medication Sig  . calcium carbonate (CALCIUM 600) 600 MG TABS tablet Take 1 tablet (600 mg total) by mouth 2 (two) times daily with a meal for 30 days.  . Cholecalciferol (VITAMIN D3) 125 MCG (5000 UT) CAPS Take 1 capsule (5,000 Units total) by mouth daily with breakfast. Take along with calcium and magnesium.  Marland Kitchen doxycycline (VIBRA-TABS) 100 MG tablet Take 1 tablet (100 mg total) by mouth 2 (two) times daily.  . ergocalciferol (VITAMIN D2) 1.25 MG (50000 UT) capsule Take 1 capsule (50,000 Units total) by mouth 2 (two) times a week. X 6 weeks.  . Magnesium 500 MG CAPS Take 1 capsule (500 mg total) by mouth 2 (two) times daily at 8 am and 10 pm.  . Multiple Vitamin (MULTIVITAMIN) tablet Take 1 tablet by mouth daily.  Marland Kitchen omeprazole (PRILOSEC) 20 MG capsule Take 1 capsule (20 mg total) by mouth daily.  . ondansetron (ZOFRAN) 4 MG tablet Take 4 mg by mouth every 8 (eight) hours as needed for nausea or vomiting.  . Oxycodone HCl 20 MG TABS Take 20 mg by mouth 3 (three) times  daily. Per patient  . Pseudoephedrine HCl (NASAL DECONGESTANT PO) Take by mouth.  . QUEtiapine (SEROQUEL) 300 MG tablet TAKE 1 TABLET BY MOUTH AT BEDTIME  . rosuvastatin (CRESTOR) 10 MG tablet TAKE ONE TABLET BY MOUTH EVERY DAY  . sucralfate (CARAFATE) 1 g tablet Take 1 g by mouth 4 (four) times daily -  with meals and at bedtime.  . valACYclovir (VALTREX) 500 MG tablet Take 1 tablet (500 mg total) by mouth 2 (two) times daily.   No facility-administered encounter medications on file as of 08/11/2018.     Review of Systems  Constitutional: Negative for appetite change and fever.  HENT: Positive for congestion, postnasal drip and sinus pressure.   Respiratory: Positive for cough. Negative for chest tightness and shortness of breath.   Cardiovascular: Negative for chest pain, palpitations and leg swelling.  Gastrointestinal: Negative for diarrhea, nausea and vomiting.  Musculoskeletal: Negative for myalgias.  Skin: Negative for color change and rash.  Neurological: Negative for dizziness, light-headedness and headaches.  Psychiatric/Behavioral: Negative for agitation and dysphoric mood.       Objective:    Physical Exam Constitutional:      General: He is not in acute distress.    Appearance: Normal appearance. He is well-developed.  HENT:     Head:     Comments: Increased sinus pressure to palpation.      Nose: Congestion present.     Mouth/Throat:     Pharynx: No oropharyngeal exudate or posterior oropharyngeal erythema.  Cardiovascular:     Rate and Rhythm: Normal rate and regular rhythm.  Pulmonary:     Effort: Pulmonary effort is normal. No respiratory distress.     Breath sounds: Normal breath sounds.  Skin:    Findings: No erythema or rash.  Neurological:     Mental Status: He is alert.  Psychiatric:        Mood and Affect: Mood normal.        Behavior: Behavior normal.     BP 134/80   Pulse 80   Temp 98.5 F (36.9 C) (Oral)   Resp 16   Wt 163 lb (73.9 kg)    SpO2 97%   BMI 23.39 kg/m  Wt Readings from Last 3 Encounters:  08/11/18 163 lb (73.9 kg)  07/20/18 164 lb (74.4 kg)  07/10/18 158 lb (71.7 kg)  Lab Results  Component Value Date   WBC 8.4 05/30/2018   HGB 12.7 (L) 05/30/2018   HCT 38.2 (L) 05/30/2018   PLT 246.0 05/30/2018   GLUCOSE 97 06/20/2018   CHOL 163 04/03/2018   TRIG 91.0 04/03/2018   HDL 71.30 04/03/2018   LDLCALC 74 04/03/2018   ALT 13 05/30/2018   AST 27 06/20/2018   NA 141 06/20/2018   K 4.4 06/20/2018   CL 97 06/20/2018   CREATININE 0.87 06/20/2018   BUN 9 06/20/2018   CO2 31 05/30/2018   TSH 4.14 04/03/2018   PSA 2.70 04/03/2018   INR 1.03 05/29/2009   HGBA1C 5.5 04/03/2018    Ct Abdomen Pelvis W Contrast  Result Date: 06/07/2018 CLINICAL DATA:  Upper abdominal pain and severe nausea for 2-3 weeks. Weight loss. Reported evidence of gastritis on upper endoscopy performed yesterday. EXAM: CT ABDOMEN AND PELVIS WITH CONTRAST TECHNIQUE: Multidetector CT imaging of the abdomen and pelvis was performed using the standard protocol following bolus administration of intravenous contrast. CONTRAST:  171mL OMNIPAQUE IOHEXOL 300 MG/ML  SOLN COMPARISON:  None. FINDINGS: Lower chest: No significant pulmonary nodules or acute consolidative airspace disease. Small amount of oral contrast in the lower thoracic esophagus. Hepatobiliary: Normal liver size. Scattered subcentimeter hypodense liver lesions in the left liver dome and caudate lobe, too small to characterize, which require no follow-up unless the patient has risk factors for liver malignancy. Cholecystectomy. Dilated common bile duct measuring 12 mm diameter with smooth distal tapering and no radiopaque choledocholithiasis. Mild diffuse intrahepatic biliary ductal dilatation. Large periampullary duodenal diverticulum. Pancreas: Normal, with no mass or duct dilation. Spleen: Normal size. No mass. Adrenals/Urinary Tract: Normal adrenals. Simple 3.3 cm interpolar left  renal cyst. Subcentimeter hypodense renal cortical lesions scattered in the bilateral kidneys, too small to characterize, which require no follow-up. No hydronephrosis. Normal bladder. Stomach/Bowel: Normal non-distended stomach. No disproportionately dilated small bowel loops. No focal small bowel caliber transition. No small bowel wall thickening. Oral contrast transits to the cecum. Normal appendix. Moderate to large colonic stool volume, most prominent in the proximal colon. No large bowel wall thickening, diverticulosis or significant pericolonic fat stranding. Vascular/Lymphatic: Atherosclerotic nonaneurysmal abdominal aorta. Patent portal, splenic, hepatic and renal veins. No pathologically enlarged lymph nodes in the abdomen or pelvis. Reproductive: Mildly enlarged prostate. Other: No pneumoperitoneum, ascites or focal fluid collection. Small fat containing periumbilical hernia. Musculoskeletal: No aggressive appearing focal osseous lesions. Moderate thoracolumbar spondylosis. IMPRESSION: 1. No evidence of bowel obstruction or acute bowel inflammation. Moderate to large colonic stool volume suggests constipation. 2. Cholecystectomy. Intrahepatic and extrahepatic biliary ductal dilatation with 12 mm CBD diameter with smooth distal tapering of the CBD and no radiopaque choledocholithiasis. These bile ducts are mildly prominent for the post cholecystectomy state, although still most likely due to chronic post cholecystectomy effect. Recommend correlation with serum bilirubin levels. MRI abdomen with MRCP without and with IV contrast could be obtained for further evaluation as clinically warranted. 3. Oral contrast in the lower thoracic esophagus, suggesting esophageal dysmotility and/or gastroesophageal reflux. 4. Mild prostatomegaly. 5. Small fat containing periumbilical hernia. 6.  Aortic Atherosclerosis (ICD10-I70.0). Electronically Signed   By: Ilona Sorrel M.D.   On: 06/07/2018 14:23       Assessment &  Plan:   Problem List Items Addressed This Visit    Sinusitis    Symptoms c/w sinus infection and URI.  Treat with doxycycline.  Take probiotic as directed.  nasacort nasal spray, saline nasal spray, mucinex and robitussin DM  as directed.  Follow.        Relevant Medications   doxycycline (VIBRA-TABS) 100 MG tablet       Einar Pheasant, MD

## 2018-08-13 ENCOUNTER — Encounter: Payer: Self-pay | Admitting: Internal Medicine

## 2018-08-13 DIAGNOSIS — J329 Chronic sinusitis, unspecified: Secondary | ICD-10-CM | POA: Insufficient documentation

## 2018-08-13 NOTE — Assessment & Plan Note (Signed)
Symptoms c/w sinus infection and URI.  Treat with doxycycline.  Take probiotic as directed.  nasacort nasal spray, saline nasal spray, mucinex and robitussin DM as directed.  Follow.

## 2018-08-15 ENCOUNTER — Other Ambulatory Visit: Payer: Medicare Other

## 2018-09-06 ENCOUNTER — Other Ambulatory Visit: Payer: Self-pay | Admitting: Internal Medicine

## 2018-09-15 ENCOUNTER — Encounter: Payer: Self-pay | Admitting: Internal Medicine

## 2018-09-15 ENCOUNTER — Other Ambulatory Visit: Payer: Self-pay | Admitting: Internal Medicine

## 2018-09-15 MED ORDER — QUETIAPINE FUMARATE 300 MG PO TABS: 300.0000 mg | ORAL_TABLET | Freq: Every day | ORAL | 0 refills | Status: DC

## 2018-09-15 NOTE — Telephone Encounter (Signed)
Requested medication (s) are due for refill today: yes  Requested medication (s) are on the active medication list: yes  Last refill:  07/25/2018  Future visit scheduled: yes  Notes to clinic: Not Delegated    Requested Prescriptions  Pending Prescriptions Disp Refills   QUEtiapine (SEROQUEL) 300 MG tablet 30 tablet 1    Sig: Take 1 tablet (300 mg total) by mouth at bedtime.     Not Delegated - Psychiatry:  Antipsychotics - Second Generation (Atypical) - quetiapine Failed - 09/15/2018  2:46 PM      Failed - This refill cannot be delegated      Passed - ALT in normal range and within 180 days    ALT  Date Value Ref Range Status  05/30/2018 13 0 - 53 U/L Final         Passed - AST in normal range and within 180 days    AST  Date Value Ref Range Status  06/20/2018 27 0 - 40 IU/L Final         Passed - Last BP in normal range    BP Readings from Last 1 Encounters:  08/11/18 134/80         Passed - Valid encounter within last 6 months    Recent Outpatient Visits          1 month ago Sinusitis, unspecified chronicity, unspecified location   [facility] [place], Randell Patient, MD   1 month ago Anemia, unspecified type   [facility] [place], Rand[place] Patient, MD   3 months ago Nausea   [place] [facility], Jacquelynn Cree, FNP   5 months ago Pre-op evaluation   [place], Randell Patient, MD   8 months ago Chronic maxillary sinusitis   [facility] [place] [facility], Jacquelynn Cree, FNP      Future Appointments            In 3 weeks Einar Pheasant, MD [facility], [place]   In 3 weeks O'Brien-Blaney, Bryson Corona, LPN [place], [place]

## 2018-09-15 NOTE — Telephone Encounter (Signed)
rx ok'd for seroquel #30 with no refills.  Pt going out of town for two weeks, so needs refill early.  Pharmacy note sent.  He will be out of town when refill due.

## 2018-09-15 NOTE — Telephone Encounter (Signed)
Patient is going out of town so needs a refill now he states.

## 2018-10-10 ENCOUNTER — Other Ambulatory Visit: Payer: Self-pay

## 2018-10-10 ENCOUNTER — Ambulatory Visit (INDEPENDENT_AMBULATORY_CARE_PROVIDER_SITE_OTHER): Payer: Medicare Other

## 2018-10-10 ENCOUNTER — Ambulatory Visit (INDEPENDENT_AMBULATORY_CARE_PROVIDER_SITE_OTHER): Payer: Medicare Other | Admitting: Internal Medicine

## 2018-10-10 DIAGNOSIS — Z0000 Encounter for general adult medical examination without abnormal findings: Secondary | ICD-10-CM | POA: Diagnosis not present

## 2018-10-10 DIAGNOSIS — J329 Chronic sinusitis, unspecified: Secondary | ICD-10-CM

## 2018-10-10 DIAGNOSIS — G479 Sleep disorder, unspecified: Secondary | ICD-10-CM | POA: Diagnosis not present

## 2018-10-10 NOTE — Progress Notes (Signed)
Patient ID: Mike Farley, male   DOB: 1951/01/10, 68 y.o.   MRN: [id number]   Virtual Visit via video Note  This visit type was conducted due to national recommendations for restrictions regarding the COVID-19 pandemic (e.g. social distancing).  This format is felt to be most appropriate for this patient at this time.  All issues noted in this document were discussed and addressed.  No physical exam was performed (except for noted visual exam findings with Video Visits).   I connected with [name] by a video enabled telemedicine application and verified that I am speaking with the correct person using two identifiers. Location patient: home Location provider: work  Persons participating in the virtual visit: patient, provider  I discussed the limitations, risks, security and privacy concerns of performing an evaluation and management service by video and the availability of in person appointments. The patient expressed understanding and agreed to proceed.   Reason for visit: work in appt for sleeping problems.   HPI: Work in appt with concerns regarding not sleeping well.  Discussed with him today.  On seroquel 300mg  q hs.  Discussed not wanting to increase dose of seroquel  He has just added melatonin.  Discussed possible etiologies. Increased back pain.  Has been followed by Dr Carloyn Manner.  He is retired.  No has plans to f/u with Dr Deirdre Peer and Dr Hardin Negus.  Planning for nerve block.  Discussed that the pain may be contributing to him not sleeping well.  Handling stress.  Breathing doing well.  No chest pain.  No sob.  No increased cough or congestion.  Eating well.     ROS: See pertinent positives and negatives per HPI.  Past Medical History:  Diagnosis Date  . Cervical pain (neck)    limited mobility  . Chronic pain   . Depression    years ago (minor)    Past Surgical History:  Procedure Laterality Date  . ANTERIOR FUSION CERVICAL SPINE  2011   4-5 levels  . APPENDECTOMY     . BONE EXCISION Left 09/28/2017   Procedure: BONE EXCISION-TAILORS  EXOSTECTOMY;  Surgeon: Samara Deist, DPM;  Location: [facility];  Service: Podiatry;  Laterality: Left;  IVA LOCAL  . CERVICAL DISCECTOMY  2008   posterior  . CHOLECYSTECTOMY    . ELBOW FRACTURE SURGERY Left 2004  . SHOULDER SURGERY Right   . TONSILLECTOMY AND ADENOIDECTOMY    . TOTAL KNEE ARTHROPLASTY Right 2005   x 4    Family History  Problem Relation Age of Onset  . Arthritis Mother   . Cancer Mother        colon & breast cnacer  . Mental illness Mother        Depression & bipolar  . Cancer Father        lung & prostate cancer  . Alcohol abuse Brother   . Kidney disease Brother     SOCIAL HX: reviewed.    Current Outpatient Medications:  .  calcium carbonate (CALCIUM 600) 600 MG TABS tablet, Take 1 tablet (600 mg total) by mouth 2 (two) times daily with a meal for 30 days., Disp: 60 tablet, Rfl: 0 .  Cholecalciferol (VITAMIN D3) 125 MCG (5000 UT) CAPS, Take 1 capsule (5,000 Units total) by mouth daily with breakfast. Take along with calcium and magnesium., Disp: 30 capsule, Rfl: 5 .  Magnesium 500 MG CAPS, Take 1 capsule (500 mg total) by mouth 2 (two) times daily at 8 am and 10  pm., Disp: 60 capsule, Rfl: 5 .  Multiple Vitamin (MULTIVITAMIN) tablet, Take 1 tablet by mouth daily., Disp: , Rfl:  .  omeprazole (PRILOSEC) 40 MG capsule, , Disp: , Rfl:  .  ondansetron (ZOFRAN) 4 MG tablet, Take 4 mg by mouth every 8 (eight) hours as needed for nausea or vomiting., Disp: , Rfl:  .  Oxycodone HCl 20 MG TABS, Take 20 mg by mouth 3 (three) times daily. Per patient, Disp: , Rfl:  .  Pseudoephedrine HCl (NASAL DECONGESTANT PO), Take by mouth., Disp: , Rfl:  .  QUEtiapine (SEROQUEL) 300 MG tablet, Take 1 tablet (300 mg total) by mouth at bedtime., Disp: 30 tablet, Rfl: 0 .  rosuvastatin (CRESTOR) 10 MG tablet, TAKE 1 TABLET BY MOUTH DAILY, Disp: 30 tablet, Rfl: 1 .  sucralfate (CARAFATE) 1 g tablet, Take 1  g by mouth 4 (four) times daily -  with meals and at bedtime., Disp: , Rfl:  .  valACYclovir (VALTREX) 500 MG tablet, Take 1 tablet (500 mg total) by mouth 2 (two) times daily., Disp: 20 tablet, Rfl: 0  EXAM:  GENERAL: alert, oriented, appears well and in no acute distress  HEENT: atraumatic, conjunttiva clear, no obvious abnormalities on inspection of external nose and ears  NECK: normal movements of the head and neck  LUNGS: on inspection no signs of respiratory distress, breathing rate appears normal, no obvious gross SOB, gasping or wheezing  CV: no obvious cyanosis  PSYCH/NEURO: pleasant and cooperative, no obvious depression or anxiety, speech and thought processing grossly intact  ASSESSMENT AND PLAN:  Discussed the following assessment and plan:  Sleep difficulties  Sinusitis, unspecified chronicity, unspecified location  Sleep difficulties Discussed with him today.  Discussed pain may be contributing.  Taking oxycodone.  Plans to f/u with Dr Hardin Negus (pain clinic) and Dr Deirdre Peer.  Hold on additional medication. Continue seroquel.  Just started melatonin.  Give this time to see if works.  Follow.  Pt comfortable with this plan.    Sinusitis Resolved.     I discussed the assessment and treatment plan with the patient. The patient was provided an opportunity to ask questions and all were answered. The patient agreed with the plan and demonstrated an understanding of the instructions.   The patient was advised to call back or seek an in-person evaluation if the symptoms worsen or if the condition fails to improve as anticipated.    Einar Pheasant, MD

## 2018-10-10 NOTE — Patient Instructions (Addendum)
  Mike Farley , Thank you for taking time to come for your Medicare Wellness Visit. I appreciate your ongoing commitment to your health goals. Please review the following plan we discussed and let me know if I can assist you in the future.   These are the goals we discussed: Goals    . Follow up with Primary Care Provider     As needed for overall routine health maintenance.       This is a list of the screening recommended for you and due dates:  Health Maintenance  Topic Date Due  . Flu Shot  12/23/2018  . Tetanus Vaccine  10/20/2025  . Colon Cancer Screening  06/24/2027  .  Hepatitis C: One time screening is recommended by Center for Disease Control  (CDC) for  adults born from [birth year] through 1965.   Completed  . Pneumonia vaccines  Completed

## 2018-10-10 NOTE — Progress Notes (Signed)
Subjective:   Mike Farley is a 68 y.o. male who presents for Medicare Annual/Subsequent preventive examination.  Review of Systems:  No ROS.  Medicare Wellness Virtual Visit.  Visual/audio telehealth visit, UTA vital signs.   See social history for additional risk factors.   Cardiac Risk Factors include: advanced age (>50men, >48 women);male gender     Objective:    Vitals: There were no vitals taken for this visit.  There is no height or weight on file to calculate BMI.  Advanced Directives 10/10/2018 09/28/2017 10/22/2016  Does Patient Have a Medical Advance Directive? Yes Yes No  Type of Advance Directive Living will;Healthcare Power of [place];Living will -  Does patient want to make changes to medical advance directive? No - Patient declined - -  Copy of [place] in Chart? No - copy requested No - copy requested -  Would patient like information on creating a medical advance directive? - - No - Patient declined    Tobacco Social History   Tobacco Use  Smoking Status Former Smoker  Smokeless Tobacco Current User  . Types: Chew     Ready to quit: Not Answered Counseling given: Not Answered   Clinical Intake:  Pre-visit preparation completed: Yes        Diabetes: No  How often do you need to have someone help you when you read instructions, pamphlets, or other written materials from your doctor or pharmacy?: 1 - Never  Interpreter Needed?: No     Past Medical History:  Diagnosis Date  . Cervical pain (neck)    limited mobility  . Chronic pain   . Depression    years ago (minor)   Past Surgical History:  Procedure Laterality Date  . ANTERIOR FUSION CERVICAL SPINE  2011   4-5 levels  . APPENDECTOMY    . BONE EXCISION Left 09/28/2017   Procedure: BONE EXCISION-TAILORS  EXOSTECTOMY;  Surgeon: Samara Deist, DPM;  Location: [facility];  Service: Podiatry;  Laterality: Left;  IVA LOCAL  .  CERVICAL DISCECTOMY  2008   posterior  . CHOLECYSTECTOMY    . ELBOW FRACTURE SURGERY Left 2004  . SHOULDER SURGERY Right   . TONSILLECTOMY AND ADENOIDECTOMY    . TOTAL KNEE ARTHROPLASTY Right 2005   x 4   Family History  Problem Relation Age of Onset  . Arthritis Mother   . Cancer Mother        colon & breast cnacer  . Mental illness Mother        Depression & bipolar  . Cancer Father        lung & prostate cancer  . Alcohol abuse Brother   . Kidney disease Brother    Social History   Socioeconomic History  . Marital status: Married    Spouse name: Not on file  . Number of children: Not on file  . Years of education: Not on file  . Highest education level: Not on file  Occupational History  . Not on file  Social Needs  . Financial resource strain: Not hard at all  . Food insecurity:    Worry: Never true    Inability: Never true  . Transportation needs:    Medical: No    Non-medical: No  Tobacco Use  . Smoking status: Former Research scientist (life sciences)  . Smokeless tobacco: Current User    Types: Chew  Substance and Sexual Activity  . Alcohol use: Yes    Alcohol/week: 0.0  standard drinks    Comment: socially  . Drug use: Not on file  . Sexual activity: Not on file  Lifestyle  . Physical activity:    Days per week: Not on file    Minutes per session: Not on file  . Stress: Not on file  Relationships  . Social connections:    Talks on phone: Not on file    Gets together: Not on file    Attends religious service: Not on file    Active member of club or organization: Not on file    Attends meetings of clubs or organizations: Not on file    Relationship status: Not on file  Other Topics Concern  . Not on file  Social History Narrative  . Not on file    Outpatient Encounter Medications as of 10/10/2018  Medication Sig  . calcium carbonate (CALCIUM 600) 600 MG TABS tablet Take 1 tablet (600 mg total) by mouth 2 (two) times daily with a meal for 30 days.  . Cholecalciferol  (VITAMIN D3) 125 MCG (5000 UT) CAPS Take 1 capsule (5,000 Units total) by mouth daily with breakfast. Take along with calcium and magnesium.  . Magnesium 500 MG CAPS Take 1 capsule (500 mg total) by mouth 2 (two) times daily at 8 am and 10 pm.  . Multiple Vitamin (MULTIVITAMIN) tablet Take 1 tablet by mouth daily.  . ondansetron (ZOFRAN) 4 MG tablet Take 4 mg by mouth every 8 (eight) hours as needed for nausea or vomiting.  . Oxycodone HCl 20 MG TABS Take 20 mg by mouth 3 (three) times daily. Per patient  . Pseudoephedrine HCl (NASAL DECONGESTANT PO) Take by mouth.  . QUEtiapine (SEROQUEL) 300 MG tablet Take 1 tablet (300 mg total) by mouth at bedtime.  . rosuvastatin (CRESTOR) 10 MG tablet TAKE 1 TABLET BY MOUTH DAILY  . sucralfate (CARAFATE) 1 g tablet Take 1 g by mouth 4 (four) times daily -  with meals and at bedtime.  . valACYclovir (VALTREX) 500 MG tablet Take 1 tablet (500 mg total) by mouth 2 (two) times daily.  . [DISCONTINUED] doxycycline (VIBRA-TABS) 100 MG tablet Take 1 tablet (100 mg total) by mouth 2 (two) times daily.  . [DISCONTINUED] omeprazole (PRILOSEC) 20 MG capsule Take 1 capsule (20 mg total) by mouth daily.   No facility-administered encounter medications on file as of 10/10/2018.     Activities of Daily Living In your present state of health, do you have any difficulty performing the following activities: 10/10/2018  Vision? N  Difficulty concentrating or making decisions? N  Walking or climbing stairs? N  Dressing or bathing? N  Doing errands, shopping? N  Preparing Food and eating ? N  Using the Toilet? N  In the past six months, have you accidently leaked urine? N  Do you have problems with loss of bowel control? N  Managing your Medications? N  Managing your Finances? N  Housekeeping or managing your Housekeeping? N  Some recent data might be hidden    Patient Care Team: Einar Pheasant, MD as PCP - General (Internal Medicine)   Assessment:   This is a  routine wellness examination for Mike Farley.  I connected with patient 10/10/18 at 11:00 AM EDT by a video/audio enabled telemedicine application and verified that I am speaking with the correct person using two identifiers. Patient stated full name and DOB. Patient gave permission to continue with virtual visit. Patient's location was at home and Nurse's location was at [place] office.  Health Screenings  Colonoscopy - 05/2017 Glaucoma -none Hearing -demonstrates normal hearing during visit; however states he plans to have baseline audiologic testing within the next 12 months.  Labs- followed by pcp  Dental- every 6 months Vision- he plans to schedule exam  Social  Alcohol intake - yes, socially      Smoking history- former  Tobacco use in the home? self, chew. He is not ready to quit.  Illicit drug use? none Exercise - walking 10,000 steps daily Diet - regular BMI- discussed the importance of a healthy diet, water intake and the benefits of aerobic exercise.  Educational material provided.   Safety  Patient feels safe at home- yes Patient does have smoke detectors at home- yes Patient does wear sunscreen or protective clothing when in direct sunlight -yes Patient does wear seat belt when in a moving vehicle -yes  Covid-19 precautions and sickness symptoms discussed.   Activities of Daily Living Patient denies needing assistance with: driving, household chores, feeding themselves, getting from bed to chair, getting to the toilet, bathing/showering, dressing, managing money, or preparing meals.  No new identified risk were noted.    Depression Screen Patient denies losing interest in daily life, feeling hopeless, or crying easily over simple problems.   Medication-taking as directed and without issues.   Fall Screen Patient denies being afraid of falling or falling in the last year.   Memory Screen Patient is alert.  Patient denies difficulty focusing, concentrating or  misplacing items. Correctly identified the president of the [place] , season and recall. Patient likes to read for brain exercise and engagement.   Immunizations The following Immunizations were discussed: Influenza, shingles, pneumonia, and tetanus.   Other Providers Patient Care Team: Einar Pheasant, MD as PCP - General (Internal Medicine)  Exercise Activities and Dietary recommendations Current Exercise Habits: Home exercise routine, Type of exercise: walking, Time (Minutes): 30, Frequency (Times/Week): 5, Weekly Exercise (Minutes/Week): 150, Intensity: Moderate  Goals    . Follow up with Primary Care Provider     As needed for overall routine health maintenance.       Fall Risk Fall Risk  07/10/2018 06/20/2018 10/06/2017 02/25/2017 06/27/2015  Falls in the past year? 0 0 No No No   Depression Screen PHQ 2/9 Scores 10/10/2018 07/10/2018 10/06/2017 02/25/2017  PHQ - 2 Score 0 0 0 0  PHQ- 9 Score 2 - - -    Cognitive Function MMSE - Mini Mental State Exam 10/06/2017  Orientation to time 5  Orientation to Place 5  Registration 3  Attention/ Calculation 5  Recall 3  Language- name 2 objects 2  Language- repeat 1  Language- follow 3 step command 3  Language- read & follow direction 1  Write a sentence 1  Copy design 1  Total score 30        Immunization History  Administered Date(s) Administered  . Influenza-Unspecified 04/22/2015, 05/04/2017, 03/13/2018  . Pneumococcal Conjugate-13 06/27/2015  . Pneumococcal Polysaccharide-23 06/24/2017  . Tdap 10/21/2015   Screening Tests Health Maintenance  Topic Date Due  . INFLUENZA VACCINE  12/23/2018  . TETANUS/TDAP  10/20/2025  . COLONOSCOPY  06/24/2027  . Hepatitis C Screening  Completed  . PNA vac Low Risk Adult  Completed      Plan:    End of life planning; Advance aging; Advanced directives discussed.  Copy of current HCPOA/Living Will requested.    I have personally reviewed and noted the following in the patient's  chart:   . Medical  and social history . Use of alcohol, tobacco or illicit drugs  . Current medications and supplements . Functional ability and status . Nutritional status . Physical activity . Advanced directives . List of other physicians . Hospitalizations, surgeries, and ER visits in previous 12 months . Vitals . Screenings to include cognitive, depression, and falls . Referrals and appointments  In addition, I have reviewed and discussed with patient certain preventive protocols, quality metrics, and best practice recommendations. A written personalized care plan for preventive services as well as general preventive health recommendations were provided to patient.     Varney Biles, LPN  06/03/5518   Reviewed above information.  Agree with assessment and plan.    Dr Nicki Reaper

## 2018-10-14 ENCOUNTER — Encounter: Payer: Self-pay | Admitting: Internal Medicine

## 2018-10-14 NOTE — Assessment & Plan Note (Signed)
Resolved

## 2018-10-14 NOTE — Assessment & Plan Note (Signed)
Discussed with him today.  Discussed pain may be contributing.  Taking oxycodone.  Plans to f/u with Dr Hardin Negus (pain clinic) and Dr Deirdre Peer.  Hold on additional medication. Continue seroquel.  Just started melatonin.  Give this time to see if works.  Follow.  Pt comfortable with this plan.

## 2018-10-16 ENCOUNTER — Encounter: Payer: Self-pay | Admitting: Internal Medicine

## 2018-10-17 MED ORDER — QUETIAPINE FUMARATE 300 MG PO TABS: 300.0000 mg | ORAL_TABLET | Freq: Every day | ORAL | 1 refills | Status: DC

## 2018-10-17 NOTE — Telephone Encounter (Signed)
rx sent in for seroquel

## 2018-10-18 DIAGNOSIS — M47812 Spondylosis without myelopathy or radiculopathy, cervical region: Secondary | ICD-10-CM | POA: Diagnosis not present

## 2018-10-18 DIAGNOSIS — G894 Chronic pain syndrome: Secondary | ICD-10-CM | POA: Diagnosis not present

## 2018-10-18 DIAGNOSIS — M4802 Spinal stenosis, cervical region: Secondary | ICD-10-CM | POA: Diagnosis not present

## 2018-10-18 DIAGNOSIS — Z79891 Long term (current) use of opiate analgesic: Secondary | ICD-10-CM | POA: Diagnosis not present

## 2018-11-07 ENCOUNTER — Other Ambulatory Visit: Payer: Self-pay | Admitting: Internal Medicine

## 2018-11-09 DIAGNOSIS — M47812 Spondylosis without myelopathy or radiculopathy, cervical region: Secondary | ICD-10-CM | POA: Diagnosis not present

## 2018-11-09 DIAGNOSIS — M4802 Spinal stenosis, cervical region: Secondary | ICD-10-CM | POA: Diagnosis not present

## 2018-11-09 DIAGNOSIS — G894 Chronic pain syndrome: Secondary | ICD-10-CM | POA: Diagnosis not present

## 2018-11-09 DIAGNOSIS — Z79891 Long term (current) use of opiate analgesic: Secondary | ICD-10-CM | POA: Diagnosis not present

## 2018-11-14 ENCOUNTER — Other Ambulatory Visit: Payer: Self-pay | Admitting: Internal Medicine

## 2018-11-16 NOTE — Telephone Encounter (Signed)
Last OV 10/10/2018 Next OV 01/24/2019 Last refill 10/17/18

## 2018-11-16 NOTE — Telephone Encounter (Signed)
Reviewed meds in chart.  He received rx on 10/17/18 for #30 with one refill.  Please clarify with pharmacy.  He should have another refill on the medication.

## 2018-11-16 NOTE — Telephone Encounter (Signed)
Pt just picked up last refill. Pharmacy requested for future refills.

## 2018-12-06 ENCOUNTER — Other Ambulatory Visit: Payer: Self-pay | Admitting: Internal Medicine

## 2018-12-07 DIAGNOSIS — M47812 Spondylosis without myelopathy or radiculopathy, cervical region: Secondary | ICD-10-CM | POA: Diagnosis not present

## 2018-12-07 DIAGNOSIS — Z79891 Long term (current) use of opiate analgesic: Secondary | ICD-10-CM | POA: Diagnosis not present

## 2018-12-07 DIAGNOSIS — G894 Chronic pain syndrome: Secondary | ICD-10-CM | POA: Diagnosis not present

## 2018-12-07 DIAGNOSIS — M4802 Spinal stenosis, cervical region: Secondary | ICD-10-CM | POA: Diagnosis not present

## 2019-01-09 DIAGNOSIS — M5412 Radiculopathy, cervical region: Secondary | ICD-10-CM | POA: Diagnosis not present

## 2019-01-09 DIAGNOSIS — I10 Essential (primary) hypertension: Secondary | ICD-10-CM | POA: Diagnosis not present

## 2019-01-09 DIAGNOSIS — M47812 Spondylosis without myelopathy or radiculopathy, cervical region: Secondary | ICD-10-CM | POA: Diagnosis not present

## 2019-01-09 DIAGNOSIS — M542 Cervicalgia: Secondary | ICD-10-CM | POA: Diagnosis not present

## 2019-01-15 ENCOUNTER — Other Ambulatory Visit: Payer: Self-pay | Admitting: Internal Medicine

## 2019-01-23 DIAGNOSIS — M19011 Primary osteoarthritis, right shoulder: Secondary | ICD-10-CM | POA: Diagnosis not present

## 2019-01-23 DIAGNOSIS — M75101 Unspecified rotator cuff tear or rupture of right shoulder, not specified as traumatic: Secondary | ICD-10-CM | POA: Diagnosis not present

## 2019-01-23 DIAGNOSIS — M7541 Impingement syndrome of right shoulder: Secondary | ICD-10-CM | POA: Diagnosis not present

## 2019-01-23 DIAGNOSIS — Z9889 Other specified postprocedural states: Secondary | ICD-10-CM | POA: Diagnosis not present

## 2019-01-23 DIAGNOSIS — M24111 Other articular cartilage disorders, right shoulder: Secondary | ICD-10-CM | POA: Diagnosis not present

## 2019-01-23 DIAGNOSIS — M12811 Other specific arthropathies, not elsewhere classified, right shoulder: Secondary | ICD-10-CM | POA: Diagnosis not present

## 2019-01-24 ENCOUNTER — Ambulatory Visit (INDEPENDENT_AMBULATORY_CARE_PROVIDER_SITE_OTHER): Payer: Medicare Other | Admitting: Internal Medicine

## 2019-01-24 ENCOUNTER — Other Ambulatory Visit: Payer: Self-pay

## 2019-01-24 ENCOUNTER — Encounter: Payer: Self-pay | Admitting: Internal Medicine

## 2019-01-24 DIAGNOSIS — R109 Unspecified abdominal pain: Secondary | ICD-10-CM

## 2019-01-24 DIAGNOSIS — R937 Abnormal findings on diagnostic imaging of other parts of musculoskeletal system: Secondary | ICD-10-CM

## 2019-01-24 DIAGNOSIS — R739 Hyperglycemia, unspecified: Secondary | ICD-10-CM | POA: Diagnosis not present

## 2019-01-24 DIAGNOSIS — E7800 Pure hypercholesterolemia, unspecified: Secondary | ICD-10-CM

## 2019-01-24 DIAGNOSIS — G8929 Other chronic pain: Secondary | ICD-10-CM

## 2019-01-24 DIAGNOSIS — R413 Other amnesia: Secondary | ICD-10-CM | POA: Diagnosis not present

## 2019-01-24 DIAGNOSIS — G479 Sleep disorder, unspecified: Secondary | ICD-10-CM | POA: Diagnosis not present

## 2019-01-24 DIAGNOSIS — D649 Anemia, unspecified: Secondary | ICD-10-CM | POA: Diagnosis not present

## 2019-01-24 DIAGNOSIS — M542 Cervicalgia: Secondary | ICD-10-CM

## 2019-01-24 DIAGNOSIS — I700 Atherosclerosis of aorta: Secondary | ICD-10-CM

## 2019-01-24 DIAGNOSIS — Z125 Encounter for screening for malignant neoplasm of prostate: Secondary | ICD-10-CM

## 2019-01-24 DIAGNOSIS — G3184 Mild cognitive impairment, so stated: Secondary | ICD-10-CM | POA: Insufficient documentation

## 2019-01-24 DIAGNOSIS — R972 Elevated prostate specific antigen [PSA]: Secondary | ICD-10-CM | POA: Diagnosis not present

## 2019-01-24 LAB — BASIC METABOLIC PANEL
BUN: 21 mg/dL (ref 6–23)
CO2: 29 mEq/L (ref 19–32)
Calcium: 9.4 mg/dL (ref 8.4–10.5)
Chloride: 101 mEq/L (ref 96–112)
Creatinine, Ser: 0.78 mg/dL (ref 0.40–1.50)
GFR: 98.79 mL/min (ref >60.00)
Glucose, Bld: 96 mg/dL (ref 70–99)
Potassium: 4.2 mEq/L (ref 3.5–5.1)
Sodium: 138 mEq/L (ref 135–145)

## 2019-01-24 LAB — CBC WITH DIFFERENTIAL/PLATELET
Basophils Absolute: 0 10*3/uL (ref 0.0–0.1)
Basophils Relative: 0.2 % (ref 0.0–3.0)
Eosinophils Absolute: 0 10*3/uL (ref 0.0–0.7)
Eosinophils Relative: 0.1 % (ref 0.0–5.0)
HCT: 36.9 % — ABNORMAL LOW (ref 39.0–52.0)
Hemoglobin: 12.5 g/dL — ABNORMAL LOW (ref 13.0–17.0)
Lymphocytes Relative: 14.9 % (ref 12.0–46.0)
Lymphs Abs: 1.4 10*3/uL (ref 0.7–4.0)
MCHC: 33.8 g/dL (ref 30.0–36.0)
MCV: 89.5 fl (ref 78.0–100.0)
Monocytes Absolute: 0.5 10*3/uL (ref 0.1–1.0)
Monocytes Relative: 5.8 % (ref 3.0–12.0)
Neutro Abs: 7.4 10*3/uL (ref 1.4–7.7)
Neutrophils Relative %: 79 % — ABNORMAL HIGH (ref 43.0–77.0)
Platelets: 260 10*3/uL (ref 150.0–400.0)
RBC: 4.13 Mil/uL — ABNORMAL LOW (ref 4.22–5.81)
RDW: 13.9 % (ref 11.5–15.5)
WBC: 9.3 10*3/uL (ref 4.0–10.5)

## 2019-01-24 LAB — HEPATIC FUNCTION PANEL
ALT: 13 U/L (ref 0–53)
AST: 18 U/L (ref 0–37)
Albumin: 4.6 g/dL (ref 3.5–5.2)
Alkaline Phosphatase: 99 U/L (ref 39–117)
Bilirubin, Direct: 0.1 mg/dL (ref 0.0–0.3)
Total Bilirubin: 0.4 mg/dL (ref 0.2–1.2)
Total Protein: 6.9 g/dL (ref 6.0–8.3)

## 2019-01-24 LAB — LIPID PANEL
Cholesterol: 172 mg/dL (ref 0–200)
HDL: 79.3 mg/dL (ref >39.00)
LDL Cholesterol: 73 mg/dL (ref 0–99)
NonHDL: 92.4
Total CHOL/HDL Ratio: 2
Triglycerides: 96 mg/dL (ref 0.0–149.0)
VLDL: 19.2 mg/dL (ref 0.0–40.0)

## 2019-01-24 LAB — TSH: TSH: 2.68 u[IU]/mL (ref 0.35–4.50)

## 2019-01-24 LAB — FERRITIN: Ferritin: 35.2 ng/mL (ref 22.0–322.0)

## 2019-01-24 LAB — HEMOGLOBIN A1C: Hgb A1c MFr Bld: 5.7 % (ref 4.6–6.5)

## 2019-01-24 LAB — PSA, MEDICARE: PSA: 4.65 ng/ml — ABNORMAL HIGH (ref 0.10–4.00)

## 2019-01-24 LAB — VITAMIN B12: Vitamin B-12: 325 pg/mL (ref 211–911)

## 2019-01-24 NOTE — Progress Notes (Signed)
Patient ID: Mike Farley, male   DOB: 08/02/1950, 68 y.o.   MRN: [id number] ° ° °Subjective:  ° ° Patient ID: Mike Farley, male    DOB: 02/21/1951, 68 y.o.   MRN: [id number] ° °HPI ° °Patient here for a scheduled follow up.  He reports he is doing relatively well.  Still with increased back/neck pain.  Was followed by Dr Roy.  He retired.  Saw Dr Stearn.  Apparently discussed nerve block.  Reports increased neck pain.  Is followed at [facility].  Request referral to Dr Friedman  - [facility] for evaluation and opinion on further treatment.  Reports eating and drinking well.  No chest pain.  No sob.  No abdominal pain.  No acid reflux.  Bowels moving.  Sees urology.  Overdue f/u.  Checking labs today.  Will check psa.  Discussed diet and exercise.  He is concerned regarding some memory issues.  Just can't remember some things as well.   ° ° °Past Medical History:  °Diagnosis Date  °• Cervical pain (neck)   ° limited mobility  °• Chronic pain   °• Depression   ° years ago (minor)  ° °Past Surgical History:  °Procedure Laterality Date  °• ANTERIOR FUSION CERVICAL SPINE  2011  ° 4-5 levels  °• APPENDECTOMY    °• BONE EXCISION Left 09/28/2017  ° Procedure: BONE EXCISION-TAILORS  EXOSTECTOMY;  Surgeon: Fowler, Justin, DPM;  Location: [facility];  Service: Podiatry;  Laterality: Left;  IVA LOCAL  °• CERVICAL DISCECTOMY  2008  ° posterior  °• CHOLECYSTECTOMY    °• ELBOW FRACTURE SURGERY Left 2004  °• SHOULDER SURGERY Right   °• TONSILLECTOMY AND ADENOIDECTOMY    °• TOTAL KNEE ARTHROPLASTY Right 2005  ° x 4  ° °Family History  °Problem Relation Age of Onset  °• Arthritis Mother   °• Cancer Mother   °     colon & breast cnacer  °• Mental illness Mother   °     Depression & bipolar  °• Cancer Father   °     lung & prostate cancer  °• Alcohol abuse Brother   °• Kidney disease Brother   ° °Social History  ° °Socioeconomic History  °• Marital status: Married  °  Spouse name: Not on file  °• Number of  children: Not on file  °• Years of education: Not on file  °• Highest education level: Not on file  °Occupational History  °• Not on file  °Social Needs  °• Financial resource strain: Not hard at all  °• Food insecurity  °  Worry: Never true  °  Inability: Never true  °• Transportation needs  °  Medical: No  °  Non-medical: No  °Tobacco Use  °• Smoking status: Former Smoker  °• Smokeless tobacco: Current User  °  Types: Chew  °Substance and Sexual Activity  °• Alcohol use: Yes  °  Alcohol/week: 0.0 standard drinks  °  Comment: socially  °• Drug use: Not on file  °• Sexual activity: Not on file  °Lifestyle  °• Physical activity  °  Days per week: Not on file  °  Minutes per session: Not on file  °• Stress: Not on file  °Relationships  °• Social connections  °  Talks on phone: Not on file  °  Gets together: Not on file  °  Attends religious service: Not on file  °  Active member of club or   organization: Not on file    Attends meetings of clubs or organizations: Not on file    Relationship status: Not on file  Other Topics Concern   Not on file  Social History Narrative   Not on file    Outpatient Encounter Medications as of 01/24/2019  Medication Sig   Multiple Vitamin (MULTIVITAMIN) tablet Take 1 tablet by mouth daily.   Oxycodone HCl 20 MG TABS Take 20 mg by mouth 3 (three) times daily. Per patient   Pseudoephedrine HCl (NASAL DECONGESTANT PO) Take by mouth.   QUEtiapine (SEROQUEL) 300 MG tablet TAKE ONE TABLET AT BEDTIME   rosuvastatin (CRESTOR) 10 MG tablet TAKE ONE TABLET BY MOUTH EVERY DAY   valACYclovir (VALTREX) 500 MG tablet Take 1 tablet (500 mg total) by mouth 2 (two) times daily.   [DISCONTINUED] calcium carbonate (CALCIUM 600) 600 MG TABS tablet Take 1 tablet (600 mg total) by mouth 2 (two) times daily with a meal for 30 days.   [DISCONTINUED] omeprazole (PRILOSEC) 40 MG capsule    [DISCONTINUED] ondansetron (ZOFRAN) 4 MG tablet Take 4 mg by mouth every 8 (eight) hours as  needed for nausea or vomiting.   [DISCONTINUED] sucralfate (CARAFATE) 1 g tablet Take 1 g by mouth 4 (four) times daily -  with meals and at bedtime.   No facility-administered encounter medications on file as of 01/24/2019.     Review of Systems  Constitutional: Negative for appetite change and unexpected weight change.  HENT: Negative for congestion and sinus pressure.   Respiratory: Negative for cough, chest tightness and shortness of breath.   Cardiovascular: Negative for chest pain, palpitations and leg swelling.  Gastrointestinal: Negative for abdominal pain, diarrhea, nausea and vomiting.  Genitourinary: Negative for difficulty urinating and dysuria.  Musculoskeletal: Negative for joint swelling and myalgias.       Persistent neck and shoulder pain.  Increased pain.    Skin: Negative for color change and rash.  Neurological: Negative for dizziness, light-headedness and headaches.  Psychiatric/Behavioral: Negative for agitation and dysphoric mood.       Objective:    Physical Exam Constitutional:      General: He is not in acute distress.    Appearance: Normal appearance. He is well-developed.  HENT:     Head: Normocephalic and atraumatic.     Right Ear: External ear normal.     Left Ear: External ear normal.  Eyes:     General: No scleral icterus.       Right eye: No discharge.        Left eye: No discharge.     Conjunctiva/sclera: Conjunctivae normal.  Neck:     Musculoskeletal: Neck supple. No muscular tenderness.  Cardiovascular:     Rate and Rhythm: Normal rate and regular rhythm.  Pulmonary:     Effort: Pulmonary effort is normal. No respiratory distress.     Breath sounds: Normal breath sounds.  Abdominal:     General: Bowel sounds are normal.     Palpations: Abdomen is soft.     Tenderness: There is no abdominal tenderness.  Musculoskeletal:        General: No swelling or tenderness.  Skin:    Findings: No erythema or rash.  Neurological:     Mental  Status: He is alert.  Psychiatric:        Mood and Affect: Mood normal.        Behavior: Behavior normal.     BP 128/70    Pulse 71  Temp 97.9 °F (36.6 °C) (Temporal)    Resp 16    Wt 161 lb 3.2 oz (73.1 kg)    SpO2 98%    BMI 23.13 kg/m²  °Wt Readings from Last 3 Encounters:  °01/24/19 161 lb 3.2 oz (73.1 kg)  °08/11/18 163 lb (73.9 kg)  °07/20/18 164 lb (74.4 kg)  ° ° ° °Lab Results  °Component Value Date  ° WBC 9.3 01/24/2019  ° HGB 12.5 (L) 01/24/2019  ° HCT 36.9 (L) 01/24/2019  ° PLT 260.0 01/24/2019  ° GLUCOSE 96 01/24/2019  ° CHOL 172 01/24/2019  ° TRIG 96.0 01/24/2019  ° HDL 79.30 01/24/2019  ° LDLCALC 73 01/24/2019  ° ALT 13 01/24/2019  ° AST 18 01/24/2019  ° NA 138 01/24/2019  ° K 4.2 01/24/2019  ° CL 101 01/24/2019  ° CREATININE 0.78 01/24/2019  ° BUN 21 01/24/2019  ° CO2 29 01/24/2019  ° TSH 2.68 01/24/2019  ° PSA 4.65 (H) 01/24/2019  ° INR 1.03 05/29/2009  ° HGBA1C 5.7 01/24/2019  ° ° °Ct Abdomen Pelvis W Contrast ° °Result Date: 06/07/2018 °CLINICAL DATA:  Upper abdominal pain and severe nausea for 2-3 weeks. Weight loss. Reported evidence of gastritis on upper endoscopy performed yesterday. EXAM: CT ABDOMEN AND PELVIS WITH CONTRAST TECHNIQUE: Multidetector CT imaging of the abdomen and pelvis was performed using the standard protocol following bolus administration of intravenous contrast. CONTRAST:  100mL OMNIPAQUE IOHEXOL 300 MG/ML  SOLN COMPARISON:  None. FINDINGS: Lower chest: No significant pulmonary nodules or acute consolidative airspace disease. Small amount of oral contrast in the lower thoracic esophagus. Hepatobiliary: Normal liver size. Scattered subcentimeter hypodense liver lesions in the left liver dome and caudate lobe, too small to characterize, which require no follow-up unless the patient has risk factors for liver malignancy. Cholecystectomy. Dilated common bile duct measuring 12 mm diameter with smooth distal tapering and no radiopaque choledocholithiasis. Mild diffuse  intrahepatic biliary ductal dilatation. Large periampullary duodenal diverticulum. Pancreas: Normal, with no mass or duct dilation. Spleen: Normal size. No mass. Adrenals/Urinary Tract: Normal adrenals. Simple 3.3 cm interpolar left renal cyst. Subcentimeter hypodense renal cortical lesions scattered in the bilateral kidneys, too small to characterize, which require no follow-up. No hydronephrosis. Normal bladder. Stomach/Bowel: Normal non-distended stomach. No disproportionately dilated small bowel loops. No focal small bowel caliber transition. No small bowel wall thickening. Oral contrast transits to the cecum. Normal appendix. Moderate to large colonic stool volume, most prominent in the proximal colon. No large bowel wall thickening, diverticulosis or significant pericolonic fat stranding. Vascular/Lymphatic: Atherosclerotic nonaneurysmal abdominal aorta. Patent portal, splenic, hepatic and renal veins. No pathologically enlarged lymph nodes in the abdomen or pelvis. Reproductive: Mildly enlarged prostate. Other: No pneumoperitoneum, ascites or focal fluid collection. Small fat containing periumbilical hernia. Musculoskeletal: No aggressive appearing focal osseous lesions. Moderate thoracolumbar spondylosis. IMPRESSION: 1. No evidence of bowel obstruction or acute bowel inflammation. Moderate to large colonic stool volume suggests constipation. 2. Cholecystectomy. Intrahepatic and extrahepatic biliary ductal dilatation with 12 mm CBD diameter with smooth distal tapering of the CBD and no radiopaque choledocholithiasis. These bile ducts are mildly prominent for the post cholecystectomy state, although still most likely due to chronic post cholecystectomy effect. Recommend correlation with serum bilirubin levels. MRI abdomen with MRCP without and with IV contrast could be obtained for further evaluation as clinically warranted. 3. Oral contrast in the lower thoracic esophagus, suggesting esophageal dysmotility  and/or gastroesophageal reflux. 4. Mild prostatomegaly. 5. Small fat containing periumbilical hernia. 6.  Aortic Atherosclerosis (ICD10-I70.0).   Electronically Signed   By: Ilona Sorrel M.D.   On: 06/07/2018 14:23       Assessment & Plan:   Problem List Items Addressed This Visit    Abdominal pain   Abnormal CT scan, cervical spine (02/14/2018)    Has persistent neck/shoulder pain.  Per note, progressed cervical spinal ankylosis/arthrodesis since 2013.  Severe C4-C5 disc and facet degeneration with suspected mild spinal stenosis and moderate to severe bilateral C5 foraminal stenosis.  Progressed and up to severe joint degeneration at the right occiput C1 and C1-C2 joint spaces.  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  Discussed with him today.  Was seeing Dr Carloyn Manner.  He retired.  Request referral to Dr Yetta Glassman - [facility].        Relevant Orders   Ambulatory referral to Neurosurgery   Aortic atherosclerosis ([place])    On crestor.       Chronic neck pain (Primary Area of Pain) (Chronic)    Chronic neck and back pain.  See above for details.  Refer to Dr Yetta Glassman for further evaluation.        Relevant Orders   Ambulatory referral to Neurosurgery   Elevated PSA    Has a documented history of elevated psa.  S/p biopsy.   Recheck psa with labs today.        Hyperglycemia    Low carb diet and exercise.  Follow met b and a1c.       Memory change    Discussed with him today.  Has just noticed some change in remembering things.  Discussed focus, concentration, etc.  Check routine labs including TSH and B12.        Relevant Orders   TSH (Completed)   Vitamin B12 (Completed)   Prostate cancer screening   Relevant Orders   PSA, Medicare (Completed)   Sleep difficulties    On seroquel.  Follow.         Other Visit Diagnoses    Hypercholesterolemia       Anemia, unspecified type           Einar Pheasant, MD

## 2019-01-27 ENCOUNTER — Other Ambulatory Visit: Payer: Self-pay | Admitting: Internal Medicine

## 2019-01-27 DIAGNOSIS — D649 Anemia, unspecified: Secondary | ICD-10-CM

## 2019-01-27 DIAGNOSIS — K838 Other specified diseases of biliary tract: Secondary | ICD-10-CM

## 2019-01-27 NOTE — Progress Notes (Signed)
Order placed for GI referral.   

## 2019-01-29 ENCOUNTER — Encounter: Payer: Self-pay | Admitting: Internal Medicine

## 2019-01-29 NOTE — Assessment & Plan Note (Signed)
Low carb diet and exercise.  Follow met b and a1c.  

## 2019-01-29 NOTE — Assessment & Plan Note (Addendum)
Has persistent neck/shoulder pain.  Per note, progressed cervical spinal ankylosis/arthrodesis since 2013.  Severe C4-C5 disc and facet degeneration with suspected mild spinal stenosis and moderate to severe bilateral C5 foraminal stenosis.  Progressed and up to severe joint degeneration at the right occiput C1 and C1-C2 joint spaces.  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  Discussed with him today.  Was seeing Dr Carloyn Manner.  He retired.  Request referral to Dr Yetta Glassman - [facility].

## 2019-01-29 NOTE — Assessment & Plan Note (Signed)
Has a documented history of elevated psa.  S/p biopsy.   Recheck psa with labs today.

## 2019-01-29 NOTE — Assessment & Plan Note (Signed)
On crestor.   

## 2019-01-29 NOTE — Assessment & Plan Note (Signed)
On seroquel.  Follow.

## 2019-01-29 NOTE — Assessment & Plan Note (Signed)
Discussed with him today.  Has just noticed some change in remembering things.  Discussed focus, concentration, etc.  Check routine labs including TSH and B12.

## 2019-01-29 NOTE — Assessment & Plan Note (Signed)
Chronic neck and back pain.  See above for details.  Refer to Dr Yetta Glassman for further evaluation.

## 2019-01-31 DIAGNOSIS — R972 Elevated prostate specific antigen [PSA]: Secondary | ICD-10-CM | POA: Diagnosis not present

## 2019-01-31 DIAGNOSIS — M24411 Recurrent dislocation, right shoulder: Secondary | ICD-10-CM | POA: Diagnosis not present

## 2019-01-31 DIAGNOSIS — N5201 Erectile dysfunction due to arterial insufficiency: Secondary | ICD-10-CM | POA: Diagnosis not present

## 2019-01-31 DIAGNOSIS — N486 Induration penis plastica: Secondary | ICD-10-CM | POA: Diagnosis not present

## 2019-01-31 DIAGNOSIS — N401 Enlarged prostate with lower urinary tract symptoms: Secondary | ICD-10-CM | POA: Diagnosis not present

## 2019-01-31 DIAGNOSIS — M25511 Pain in right shoulder: Secondary | ICD-10-CM | POA: Diagnosis not present

## 2019-01-31 DIAGNOSIS — M7541 Impingement syndrome of right shoulder: Secondary | ICD-10-CM | POA: Diagnosis not present

## 2019-02-02 DIAGNOSIS — M24411 Recurrent dislocation, right shoulder: Secondary | ICD-10-CM | POA: Diagnosis not present

## 2019-02-02 DIAGNOSIS — M7541 Impingement syndrome of right shoulder: Secondary | ICD-10-CM | POA: Diagnosis not present

## 2019-02-02 DIAGNOSIS — M25511 Pain in right shoulder: Secondary | ICD-10-CM | POA: Diagnosis not present

## 2019-02-05 DIAGNOSIS — G894 Chronic pain syndrome: Secondary | ICD-10-CM | POA: Diagnosis not present

## 2019-02-05 DIAGNOSIS — M25511 Pain in right shoulder: Secondary | ICD-10-CM | POA: Diagnosis not present

## 2019-02-05 DIAGNOSIS — M24411 Recurrent dislocation, right shoulder: Secondary | ICD-10-CM | POA: Diagnosis not present

## 2019-02-05 DIAGNOSIS — M7541 Impingement syndrome of right shoulder: Secondary | ICD-10-CM | POA: Diagnosis not present

## 2019-02-05 DIAGNOSIS — M4802 Spinal stenosis, cervical region: Secondary | ICD-10-CM | POA: Diagnosis not present

## 2019-02-05 DIAGNOSIS — Z79891 Long term (current) use of opiate analgesic: Secondary | ICD-10-CM | POA: Diagnosis not present

## 2019-02-05 DIAGNOSIS — M47812 Spondylosis without myelopathy or radiculopathy, cervical region: Secondary | ICD-10-CM | POA: Diagnosis not present

## 2019-02-12 DIAGNOSIS — M24411 Recurrent dislocation, right shoulder: Secondary | ICD-10-CM | POA: Diagnosis not present

## 2019-02-12 DIAGNOSIS — R972 Elevated prostate specific antigen [PSA]: Secondary | ICD-10-CM | POA: Diagnosis not present

## 2019-02-12 DIAGNOSIS — M25511 Pain in right shoulder: Secondary | ICD-10-CM | POA: Diagnosis not present

## 2019-02-12 DIAGNOSIS — M7541 Impingement syndrome of right shoulder: Secondary | ICD-10-CM | POA: Diagnosis not present

## 2019-02-13 ENCOUNTER — Other Ambulatory Visit: Payer: Self-pay | Admitting: Anesthesiology

## 2019-02-13 DIAGNOSIS — M4802 Spinal stenosis, cervical region: Secondary | ICD-10-CM

## 2019-02-14 DIAGNOSIS — M25511 Pain in right shoulder: Secondary | ICD-10-CM | POA: Diagnosis not present

## 2019-02-14 DIAGNOSIS — M24411 Recurrent dislocation, right shoulder: Secondary | ICD-10-CM | POA: Diagnosis not present

## 2019-02-14 DIAGNOSIS — M7541 Impingement syndrome of right shoulder: Secondary | ICD-10-CM | POA: Diagnosis not present

## 2019-02-15 ENCOUNTER — Other Ambulatory Visit: Payer: Self-pay | Admitting: Urology

## 2019-02-15 ENCOUNTER — Other Ambulatory Visit (HOSPITAL_COMMUNITY): Payer: Self-pay | Admitting: Urology

## 2019-02-15 DIAGNOSIS — R972 Elevated prostate specific antigen [PSA]: Secondary | ICD-10-CM

## 2019-02-19 DIAGNOSIS — M7541 Impingement syndrome of right shoulder: Secondary | ICD-10-CM | POA: Diagnosis not present

## 2019-02-19 DIAGNOSIS — M24411 Recurrent dislocation, right shoulder: Secondary | ICD-10-CM | POA: Diagnosis not present

## 2019-02-19 DIAGNOSIS — M25511 Pain in right shoulder: Secondary | ICD-10-CM | POA: Diagnosis not present

## 2019-02-20 ENCOUNTER — Ambulatory Visit
Admission: RE | Admit: 2019-02-20 | Discharge: 2019-02-20 | Disposition: A | Discharge status: Home / Self Care | Payer: Medicare Other | Source: Ambulatory Visit | Attending: Anesthesiology | Admitting: Anesthesiology

## 2019-02-20 DIAGNOSIS — M542 Cervicalgia: Secondary | ICD-10-CM | POA: Diagnosis not present

## 2019-02-20 DIAGNOSIS — M4322 Fusion of spine, cervical region: Secondary | ICD-10-CM | POA: Diagnosis not present

## 2019-02-20 DIAGNOSIS — M4802 Spinal stenosis, cervical region: Secondary | ICD-10-CM

## 2019-02-21 DIAGNOSIS — M25511 Pain in right shoulder: Secondary | ICD-10-CM | POA: Diagnosis not present

## 2019-02-21 DIAGNOSIS — M24411 Recurrent dislocation, right shoulder: Secondary | ICD-10-CM | POA: Diagnosis not present

## 2019-02-21 DIAGNOSIS — M7541 Impingement syndrome of right shoulder: Secondary | ICD-10-CM | POA: Diagnosis not present

## 2019-02-23 ENCOUNTER — Ambulatory Visit
Admission: RE | Admit: 2019-02-23 | Discharge: 2019-02-23 | Disposition: A | Discharge status: Home / Self Care | Payer: Medicare Other | Source: Ambulatory Visit | Attending: Urology | Admitting: Urology

## 2019-02-23 ENCOUNTER — Other Ambulatory Visit: Payer: Self-pay

## 2019-02-23 DIAGNOSIS — R972 Elevated prostate specific antigen [PSA]: Secondary | ICD-10-CM | POA: Diagnosis not present

## 2019-02-23 MED ORDER — GADOBUTROL 1 MMOL/ML IV SOLN
7.0000 mL | Freq: Once | INTRAVENOUS | Status: AC | PRN
  Administered 2019-02-23: 7 mL via INTRAVENOUS

## 2019-02-28 DIAGNOSIS — R972 Elevated prostate specific antigen [PSA]: Secondary | ICD-10-CM | POA: Diagnosis not present

## 2019-02-28 DIAGNOSIS — N400 Enlarged prostate without lower urinary tract symptoms: Secondary | ICD-10-CM | POA: Diagnosis not present

## 2019-03-06 NOTE — H&P (Signed)
NAME: Mike Farley, Mike Farley MEDICAL RECORD [id number] ACCOUNT [id number] DATE OF BIRTH:1951/03/17 FACILITY: [facility] LOCATION:  PHYSICIAN:Arvie Bartholomew Farrel Conners, MD  HISTORY AND PHYSICAL  DATE OF ADMISSION:  03/15/2019  CHIEF COMPLAINT:  Elevated PSA.  HISTORY OF PRESENT ILLNESS:  The patient is a 68 year old white male with elevated PSA of 5.3 ng/mL.  He had a biopsy in 2018 which was benign, but did have an area of DNA methylation positivity on the left side at that time.  He comes in now for UroNav  fusion biopsy of the prostate.  Exosome IntelliScore was elevated at 19.37 which was above the cutoff for higher risk for grade prostate cancer.  Prostate gland MRI scan on 10/02 revealed 2 PI-RADS category 3 lesions involving the left apex and a second  lesion at the apex near the junction of the peripheral zone.  ALLERGIES:  ALLERGIC TO MORPHINE AND AMOXICILLIN.  CURRENT MEDICATIONS:  Oxycodone, rosuvastatin, Seroquel, vitamin B12 and multivitamins.  PAST SURGICAL HISTORY:  Includes cholecystectomy in 1960, right knee replacement in 2000, multiple orthopedic procedures for tendinitis in the arms, C-spine repair x4, most recently in 2011.  PAST AND CURRENT MEDICAL CONDITIONS:  Chronic neck pain, hypercholesterolemia.  REVIEW OF SYSTEMS:  The patient has allergic rhinitis.  He has a history of decreased visual and auditory acuity.  He has chronic joint discomfort.  SOCIAL HISTORY:  The patient denied tobacco use.  Consumes 3-5 alcoholic beverages per week.  FAMILY HISTORY:  Father died at age 25 of lung cancer, but also had prostate cancer.  PHYSICAL EXAMINATION: VITAL SIGNS:  Height 5 feet 8 inches, weight 159. GENERAL:  A well-nourished, white male in no distress. HEENT:  Sclerae were clear. NECK:  No palpable cervical adenopathy.  No audible carotid bruits. PULMONARY:  Lungs clear to auscultation. CARDIOVASCULAR:  Regular rhythm and rate without audible murmurs. ABDOMEN:  Soft,  nontender abdomen. GENITOURINARY:  Circumcised, testes smooth, nontender. RECTAL:  A 30 gram smooth, nontender prostate. NEUROMUSCULAR:  Alert and oriented x3.  Nonfocal.  IMPRESSION: 1.  Elevated PSA with history of DNA methylation positivity on the left at prior biopsy 2018. 2.  Rising PSA. 3.  Elevated Exosome IntelliScore. 4.  Two category 3 PI-RADS lesions.  PLAN:  Fusion biopsy of the prostate using the UroNav system.  TN/NUANCE  D:03/06/2019 T:03/06/2019 JOB:[id number]

## 2019-03-07 DIAGNOSIS — Z6825 Body mass index (BMI) 25.0-25.9, adult: Secondary | ICD-10-CM | POA: Diagnosis not present

## 2019-03-07 DIAGNOSIS — M5412 Radiculopathy, cervical region: Secondary | ICD-10-CM | POA: Diagnosis not present

## 2019-03-07 DIAGNOSIS — M542 Cervicalgia: Secondary | ICD-10-CM | POA: Diagnosis not present

## 2019-03-07 DIAGNOSIS — R030 Elevated blood-pressure reading, without diagnosis of hypertension: Secondary | ICD-10-CM | POA: Diagnosis not present

## 2019-03-07 DIAGNOSIS — M47812 Spondylosis without myelopathy or radiculopathy, cervical region: Secondary | ICD-10-CM | POA: Diagnosis not present

## 2019-03-08 ENCOUNTER — Other Ambulatory Visit: Payer: Self-pay

## 2019-03-08 ENCOUNTER — Encounter
Admission: RE | Admit: 2019-03-08 | Discharge: 2019-03-08 | Disposition: A | Discharge status: Home / Self Care | Payer: Medicare Other | Source: Ambulatory Visit | Attending: Urology | Admitting: Urology

## 2019-03-08 DIAGNOSIS — Z01818 Encounter for other preprocedural examination: Secondary | ICD-10-CM | POA: Diagnosis not present

## 2019-03-08 DIAGNOSIS — R9431 Abnormal electrocardiogram [ECG] [EKG]: Secondary | ICD-10-CM | POA: Diagnosis not present

## 2019-03-08 DIAGNOSIS — Z01810 Encounter for preprocedural cardiovascular examination: Secondary | ICD-10-CM | POA: Diagnosis not present

## 2019-03-08 DIAGNOSIS — R001 Bradycardia, unspecified: Secondary | ICD-10-CM | POA: Insufficient documentation

## 2019-03-08 NOTE — Patient Instructions (Signed)
Your procedure is scheduled on: Thursday 03/15/19.  Report to DAY SURGERY DEPARTMENT LOCATED ON 2ND FLOOR MEDICAL MALL ENTRANCE. To find out your arrival time please call [phone] between 1PM - 3PM on Wednesday 03/14/19.    Remember: Instructions that are not followed completely may result in serious medical risk, up to and including death, or upon the discretion of your surgeon and anesthesiologist your surgery may need to be rescheduled.      _X__ 1. Do not eat food after midnight the night before your procedure.                 No gum chewing or hard candies. You may drink clear liquids up to 2 hours                 before you are scheduled to arrive for your surgery- DO NOT drink clear                 liquids within 2 hours of the start of your surgery.                 Clear Liquids include:  water, apple juice without pulp, clear carbohydrate                 drink such as Clearfast or Gatorade, Black Coffee or Tea (Do not add                 milk or creamer to coffee or tea).    __X__2.  On the morning of surgery brush your teeth with toothpaste and water, you may rinse your mouth with mouthwash if you wish.  Do not swallow any toothpaste or mouthwash.       _X__ 3.  No Alcohol for 24 hours before or after surgery.    __X__4.  Notify your doctor if there is any change in your medical condition      (cold, fever, infections).       Do not wear jewelry, make-up, hairpins, clips or nail polish. Do not wear lotions, powders, or perfumes.  Do not shave 48 hours prior to surgery. Men may shave face and neck. Do not bring valuables to the hospital.     [facility] is not responsible for any belongings or valuables.    Contacts, dentures/partials or body piercings may not be worn into surgery. Bring a case for your contacts, glasses or hearing aids, a denture cup will be supplied.    Patients discharged the day of surgery will not be allowed to drive  home.     __X__ Take these medicines the morning of surgery with A SIP OF WATER:     1. Oxycodone HCl 20 MG TABS  2. rosuvastatin (CRESTOR) 10 MG tablet  3. valACYclovir (VALTREX) 500 MG tablet if needed     __X__ Fleet Enema (as directed)      __X__ Stop Anti-inflammatories 7 days before surgery such as Advil, Ibuprofen, Motrin, BC or Goodies Powder, Naprosyn, Naproxen, Aleve, Aspirin, Meloxicam. May take Tylenol if needed for pain or discomfort.     __X__ Don't start taking any new herbal supplements before your procedure.

## 2019-03-12 ENCOUNTER — Other Ambulatory Visit
Admission: RE | Admit: 2019-03-12 | Discharge: 2019-03-12 | Disposition: A | Discharge status: Home / Self Care | Payer: Medicare Other | Source: Ambulatory Visit | Attending: Urology | Admitting: Urology

## 2019-03-12 ENCOUNTER — Other Ambulatory Visit: Payer: Self-pay

## 2019-03-12 DIAGNOSIS — Z20828 Contact with and (suspected) exposure to other viral communicable diseases: Secondary | ICD-10-CM | POA: Insufficient documentation

## 2019-03-12 DIAGNOSIS — Z01812 Encounter for preprocedural laboratory examination: Secondary | ICD-10-CM | POA: Insufficient documentation

## 2019-03-12 LAB — SARS CORONAVIRUS 2 (TAT 6-24 HRS): SARS Coronavirus 2: NEGATIVE

## 2019-03-14 ENCOUNTER — Encounter: Payer: Self-pay | Admitting: Anesthesiology

## 2019-03-14 MED ORDER — LEVOFLOXACIN IN D5W 500 MG/100ML IV SOLN
500.0000 mg | INTRAVENOUS | Status: DC
  Administered 2019-03-15: 500 mg via INTRAVENOUS

## 2019-03-14 MED ORDER — GENTAMICIN SULFATE 40 MG/ML IJ SOLN
80.0000 mg | Freq: Once | INTRAVENOUS | Status: DC
  Filled 2019-03-14: qty 2

## 2019-03-14 MED ORDER — GENTAMICIN IN SALINE 1.6-0.9 MG/ML-% IV SOLN
80.0000 mg | INTRAVENOUS | Status: AC
  Administered 2019-03-15: 80 mg via INTRAVENOUS
  Filled 2019-03-14: qty 50

## 2019-03-15 ENCOUNTER — Ambulatory Visit: Payer: Medicare Other | Admitting: Anesthesiology

## 2019-03-15 ENCOUNTER — Encounter
Admission: RE | Disposition: A | Discharge status: Home / Self Care | Payer: Self-pay | Source: Home / Self Care | Attending: Urology

## 2019-03-15 ENCOUNTER — Encounter: Payer: Self-pay | Admitting: *Deleted

## 2019-03-15 ENCOUNTER — Ambulatory Visit
Admission: RE | Admit: 2019-03-15 | Discharge: 2019-03-15 | Disposition: A | Discharge status: Home / Self Care | Payer: Medicare Other | Attending: Urology | Admitting: Urology

## 2019-03-15 ENCOUNTER — Other Ambulatory Visit: Payer: Self-pay

## 2019-03-15 DIAGNOSIS — Z8042 Family history of malignant neoplasm of prostate: Secondary | ICD-10-CM | POA: Diagnosis not present

## 2019-03-15 DIAGNOSIS — Z801 Family history of malignant neoplasm of trachea, bronchus and lung: Secondary | ICD-10-CM | POA: Diagnosis not present

## 2019-03-15 DIAGNOSIS — Z79899 Other long term (current) drug therapy: Secondary | ICD-10-CM | POA: Diagnosis not present

## 2019-03-15 DIAGNOSIS — C61 Malignant neoplasm of prostate: Secondary | ICD-10-CM | POA: Insufficient documentation

## 2019-03-15 DIAGNOSIS — G8929 Other chronic pain: Secondary | ICD-10-CM | POA: Diagnosis not present

## 2019-03-15 DIAGNOSIS — M542 Cervicalgia: Secondary | ICD-10-CM | POA: Insufficient documentation

## 2019-03-15 DIAGNOSIS — F329 Major depressive disorder, single episode, unspecified: Secondary | ICD-10-CM | POA: Diagnosis not present

## 2019-03-15 DIAGNOSIS — Z885 Allergy status to narcotic agent status: Secondary | ICD-10-CM | POA: Diagnosis not present

## 2019-03-15 DIAGNOSIS — E7800 Pure hypercholesterolemia, unspecified: Secondary | ICD-10-CM | POA: Insufficient documentation

## 2019-03-15 DIAGNOSIS — Z79891 Long term (current) use of opiate analgesic: Secondary | ICD-10-CM | POA: Insufficient documentation

## 2019-03-15 DIAGNOSIS — N411 Chronic prostatitis: Secondary | ICD-10-CM | POA: Diagnosis not present

## 2019-03-15 DIAGNOSIS — R972 Elevated prostate specific antigen [PSA]: Secondary | ICD-10-CM | POA: Diagnosis not present

## 2019-03-15 DIAGNOSIS — Z96651 Presence of right artificial knee joint: Secondary | ICD-10-CM | POA: Diagnosis not present

## 2019-03-15 HISTORY — PX: PROSTATE BIOPSY: SHX241

## 2019-03-15 SURGERY — BIOPSY, PROSTATE
Anesthesia: General | Site: Prostate

## 2019-03-15 MED ORDER — LEVOFLOXACIN 500 MG PO TABS: 500.0000 mg | ORAL_TABLET | Freq: Every day | ORAL | 1 refills | Status: DC

## 2019-03-15 MED ORDER — MIDAZOLAM HCL 2 MG/2ML IJ SOLN
INTRAMUSCULAR | Status: AC
  Filled 2019-03-15: qty 2

## 2019-03-15 MED ORDER — LACTATED RINGERS IV SOLN
INTRAVENOUS | Status: DC
  Administered 2019-03-15 (×2): via INTRAVENOUS

## 2019-03-15 MED ORDER — PROPOFOL 500 MG/50ML IV EMUL
INTRAVENOUS | Status: AC
  Filled 2019-03-15: qty 50

## 2019-03-15 MED ORDER — FLEET ENEMA 7-19 GM/118ML RE ENEM
1.0000 | ENEMA | Freq: Once | RECTAL | Status: AC
  Administered 2019-03-15: 1 via RECTAL

## 2019-03-15 MED ORDER — PROPOFOL 10 MG/ML IV BOLUS
INTRAVENOUS | Status: DC | PRN
  Administered 2019-03-15: 100 mg via INTRAVENOUS

## 2019-03-15 MED ORDER — FENTANYL CITRATE (PF) 100 MCG/2ML IJ SOLN
INTRAMUSCULAR | Status: AC
  Administered 2019-03-15: 25 ug via INTRAVENOUS
  Filled 2019-03-15: qty 2

## 2019-03-15 MED ORDER — FENTANYL CITRATE (PF) 100 MCG/2ML IJ SOLN
INTRAMUSCULAR | Status: AC
  Filled 2019-03-15: qty 2

## 2019-03-15 MED ORDER — FAMOTIDINE 20 MG PO TABS
ORAL_TABLET | ORAL | Status: AC
  Filled 2019-03-15: qty 1

## 2019-03-15 MED ORDER — LEVOFLOXACIN IN D5W 500 MG/100ML IV SOLN
INTRAVENOUS | Status: AC
  Filled 2019-03-15: qty 100

## 2019-03-15 MED ORDER — MIDAZOLAM HCL 2 MG/2ML IJ SOLN
INTRAMUSCULAR | Status: DC | PRN
  Administered 2019-03-15: 2 mg via INTRAVENOUS

## 2019-03-15 MED ORDER — FENTANYL CITRATE (PF) 100 MCG/2ML IJ SOLN
25.0000 ug | INTRAMUSCULAR | Status: AC | PRN
  Administered 2019-03-15 (×6): 25 ug via INTRAVENOUS

## 2019-03-15 MED ORDER — FAMOTIDINE 20 MG PO TABS
20.0000 mg | ORAL_TABLET | Freq: Once | ORAL | Status: AC
  Administered 2019-03-15: 20 mg via ORAL

## 2019-03-15 MED ORDER — FENTANYL CITRATE (PF) 100 MCG/2ML IJ SOLN
INTRAMUSCULAR | Status: DC | PRN
  Administered 2019-03-15: 100 ug via INTRAVENOUS

## 2019-03-15 MED ORDER — SODIUM CHLORIDE 0.9 % IV SOLN
INTRAVENOUS | Status: DC | PRN
  Administered 2019-03-15: 25 ug/min via INTRAVENOUS

## 2019-03-15 MED ORDER — ONDANSETRON HCL 4 MG/2ML IJ SOLN: 4.0000 mg | Freq: Once | INTRAMUSCULAR | Status: DC | PRN

## 2019-03-15 MED ORDER — GLYCOPYRROLATE 0.2 MG/ML IJ SOLN
INTRAMUSCULAR | Status: DC | PRN
  Administered 2019-03-15: 0.2 mg via INTRAVENOUS

## 2019-03-15 SURGICAL SUPPLY — 10 items
COVER MAYO STAND REUSABLE (DRAPES) ×2 IMPLANT
COVER WAND RF STERILE (DRAPES) ×2 IMPLANT
GLOVE BIO SURGEON STRL SZ7 (GLOVE) ×4 IMPLANT
GUIDE NDL ENDOCAV 16-18 CVR (NEEDLE) IMPLANT
GUIDE NEEDLE ENDOCAV 16-18 CVR (NEEDLE) IMPLANT
INST BIOPSY MAXCORE 18GX25 (NEEDLE) ×2 IMPLANT
NDL GUIDE BIOPSY 644068 (NEEDLE) IMPLANT
NEEDLE GUIDE BIOPSY 644068 (NEEDLE) IMPLANT
SURGILUBE 2OZ TUBE FLIPTOP (MISCELLANEOUS) ×2 IMPLANT
TOWEL OR 17X26 4PK STRL BLUE (TOWEL DISPOSABLE) ×2 IMPLANT

## 2019-03-15 NOTE — Transfer of Care (Signed)
Immediate Anesthesia Transfer of Care Note  Patient: Mike Farley  Procedure(s) Performed: PROSTATE BIOPSY Uro Nav (N/A Prostate)  Patient Location: PACU  Anesthesia Type:General  Level of Consciousness: awake, alert  and oriented  Airway & Oxygen Therapy: Patient Spontanous Breathing  Post-op Assessment: Report given to RN, Post -op Vital signs reviewed and stable and Patient moving all extremities  Post vital signs: Reviewed and stable  Last Vitals:  Vitals Value Taken Time  BP 112/83 03/15/19 0949  Temp 35.9 C 03/15/19 0949  Pulse 85 03/15/19 0950  Resp 22 03/15/19 0950  SpO2 100 % 03/15/19 0950  Vitals shown include unvalidated device data.  Last Pain:  Vitals:   03/15/19 0835  TempSrc: Temporal  PainSc: 0-No pain         Complications: No apparent anesthesia complications

## 2019-03-15 NOTE — Op Note (Signed)
Preoperative diagnosis: Elevated PSA  Postoperative diagnosis: Same  Procedure: Vernelle Emerald fusion transrectal prostate biopsy  Surgeon: Otelia Limes. Yves Dill MD  Anesthesia: General  Indications:See the history and physical. After informed consent the above procedure(s) were requested     Technique and findings: After adequate general anesthesia had been obtained the patient was placed into left lateral decubitus position.  Unger sweep of the rectal vault indicated that the rectal vault was clean.  The transrectal ultrasound probe was placed and images acquired for the U[place]av.  Region of interest #1 was identified and 3 core biopsies obtained here.  Region of interest #2 was identified and 3 core biopsies obtained at this location.  At this point standard 12 core systematic biopsies were performed.  The transrectal probe was then removed and procedure was terminated.  Blood loss was minimal.  The procedure was terminated and patient transferred to the recovery room in stable condition.

## 2019-03-15 NOTE — H&P (Signed)
Date of Initial H&P: 03/06/19  History reviewed, patient examined, no change in status, stable for surgery.  

## 2019-03-15 NOTE — Anesthesia Preprocedure Evaluation (Signed)
Anesthesia Evaluation  Patient identified by MRN, date of birth, ID band Patient awake    Reviewed: Allergy & Precautions, NPO status , Patient's Chart, lab work & pertinent test results, reviewed documented beta blocker date and time   Airway Mallampati: II  TM Distance: >3 FB     Dental  (+) Chipped   Pulmonary former smoker,           Cardiovascular + Valvular Problems/Murmurs      Neuro/Psych  Headaches, PSYCHIATRIC DISORDERS Depression    GI/Hepatic   Endo/Other    Renal/GU      Musculoskeletal  (+) Arthritis ,   Abdominal   Peds  Hematology   Anesthesia Other Findings Loow sats. Chronic pain. EKG ok.  Reproductive/Obstetrics                             Anesthesia Physical Anesthesia Plan  ASA: III  Anesthesia Plan: General   Post-op Pain Management:    Induction: Intravenous  PONV Risk Score and Plan:   Airway Management Planned: LMA  Additional Equipment:   Intra-op Plan:   Post-operative Plan:   Informed Consent: I have reviewed the patients History and Physical, chart, labs and discussed the procedure including the risks, benefits and alternatives for the proposed anesthesia with the patient or authorized representative who has indicated his/her understanding and acceptance.       Plan Discussed with: CRNA  Anesthesia Plan Comments:         Anesthesia Quick Evaluation

## 2019-03-15 NOTE — Anesthesia Post-op Follow-up Note (Signed)
Anesthesia QCDR form completed.        

## 2019-03-15 NOTE — Anesthesia Postprocedure Evaluation (Signed)
Anesthesia Post Note  Patient: Mike Farley  Procedure(s) Performed: PROSTATE BIOPSY Uro Nav (N/A Prostate)  Patient location during evaluation: PACU Anesthesia Type: General Level of consciousness: awake and alert Pain management: pain level controlled Vital Signs Assessment: post-procedure vital signs reviewed and stable Respiratory status: spontaneous breathing, nonlabored ventilation, respiratory function stable and patient connected to nasal cannula oxygen Cardiovascular status: blood pressure returned to baseline and stable Postop Assessment: no apparent nausea or vomiting Anesthetic complications: no     Last Vitals:  Vitals:   03/15/19 1049 03/15/19 1105  BP:  (!) 157/81  Pulse: (!) 58 (!) 57  Resp:  17  Temp:  37.1 C  SpO2:  100%    Last Pain:  Vitals:   03/15/19 1105  TempSrc: Tympanic  PainSc: [place]

## 2019-03-15 NOTE — Discharge Instructions (Signed)
Transrectal Ultrasound-Guided Prostate Biopsy A transrectal ultrasound-guided prostate biopsy is a procedure to remove samples of prostate tissue for testing. The procedure uses ultrasound images to guide the process of removing the samples. The samples are taken to a lab to be checked for prostate cancer. This procedure is usually done to evaluate the prostate gland of men who have raised (elevated) levels of prostate-specific antigen (PSA), which can be a sign of prostate cancer. Tell a health care provider about:  Any allergies you have.  All medicines you are taking, including vitamins, herbs, eye drops, creams, and over-the-counter medicines.  Any problems you or family members have had with anesthetic medicines.  Any blood disorders you have.  Any surgeries you have had.  Any medical conditions you have. What are the risks? Generally, this is a safe procedure. However, problems may occur, including:  Prostate infection.  Bleeding from the rectum.  Blood in the urine.  Allergic reactions to medicines.  Damage to surrounding structures such as blood vessels, organs, or muscles.  Difficulty passing urine.  Nerve damage. This is usually temporary.  Pain. What happens before the procedure? Staying hydrated Follow instructions from your health care provider about hydration, which may include:  Up to 2 hours before the procedure - you may continue to drink clear liquids, such as water, clear fruit juice, black coffee, and plain tea.  Eating and drinking restrictions Follow instructions from your health care provider about eating and drinking, which may include:  8 hours before the procedure - stop eating heavy meals or foods such as meat, fried foods, or fatty foods.  6 hours before the procedure - stop eating light meals or foods, such as toast or cereal.  6 hours before the procedure - stop drinking milk or drinks that contain milk.  2 hours before the procedure -  stop drinking clear liquids. Medicines Ask your health care provider about:  Changing or stopping your regular medicines. This is especially important if you are taking diabetes medicines or blood thinners.  Taking over-the-counter medicines, vitamins, herbs, and supplements.  Taking medicines such as aspirin and ibuprofen. These medicines can thin your blood. Do not take these medicines unless your health care provider tells you to take them. General instructions  You may be given antibiotic medicine to help prevent infection. If so, take the antibiotic as told by your health care provider.  You will be given an enema. During an enema, a liquid is injected into your rectum to clear out waste.  You may have a blood or urine sample taken.  Plan to have someone take you home from the hospital or clinic. What happens during the procedure?   To lower your risk of infection: ? Your health care team will wash or sanitize their hands. ? Hair may be removed from the surgical area. ? Your skin will be cleaned with a germ-killing soap. ? You may be given antibiotic medicine.  An IV will be inserted into one of your veins.  You will be given one or both of the following: ? A medicine to help you relax (sedative). ? A medicine to numb the area (local anesthetic).  You will be placed on your left side, and your knees will be bent toward your chest.  A probe with lubricated gel will be placed into your rectum, and images will be taken of your prostate and surrounding structures.  Numbing medicine will be injected into your prostate.  A biopsy needle will be inserted through  your rectum and guided to your prostate using the ultrasound images.  Prostate tissue samples will be removed, and the needle will then be removed.  The biopsy samples will be sent to a lab to be tested. The procedure may vary among health care providers and hospitals. What happens after the procedure?  Your blood  pressure, heart rate, breathing rate, and blood oxygen level will be monitored until the medicines you were given have worn off.  You may have some discomfort in the rectal area. You will be given pain medicine as needed.  Do not drive for 24 hours if you received a sedative. Summary  A transrectal ultrasound-guided biopsy removes samples of tissue from your prostate.  This procedure is usually done to evaluate the prostate gland of men who have raised (elevated) levels of prostate-specific antigen (PSA), which can be a sign of prostate cancer.  After your procedure, you may feel some discomfort in the rectal area.  Plan to have someone take you home from the hospital or clinic. This information is not intended to replace advice given to you by your health care provider. Make sure you discuss any questions you have with your health care provider. Document Released: 09/24/2013 Document Revised: 08/30/2018 Document Reviewed: 08/06/2016 Elsevier Patient Education  [place]. Transrectal Ultrasound-Guided Prostate Biopsy, Care After This sheet gives you information about how to care for yourself after your procedure. Your doctor may also give you more specific instructions. If you have problems or questions, contact your doctor. What can I expect after the procedure? After the procedure, it is common to have:  Pain and discomfort in your butt, especially while sitting.  Pink-colored pee (urine), due to small amounts of blood in the pee.  Burning while peeing (urinating).  Blood in your poop (stool).  Bleeding from your butt.  Blood in your semen. Follow these instructions at home: Medicines  Take over-the-counter and prescription medicines only as told by your doctor.  If you were prescribed antibiotic medicine, take it as told by your doctor. Do not stop taking the antibiotic even if you start to feel better. Activity   Do not drive for 24 hours if you were given a  medicine to help you relax (sedative) during your procedure.  Return to your normal activities as told by your doctor. Ask your doctor what activities are safe for you.  Ask your doctor when it is okay for you to have sex.  Do not lift anything that is heavier than 10 lb (4.5 kg), or the limit that you are told, until your doctor says that it is safe. General instructions   Drink enough water to keep your pee pale yellow.  Watch your pee, poop, and semen for new bleeding or bleeding that gets worse.  Keep all follow-up visits as told by your doctor. This is important. Contact a doctor if you:  Have blood clots in your pee or poop.  Notice that your pee smells bad or unusual.  Have very bad belly pain.  Have trouble peeing.  Notice that your lower belly feels firm.  Have blood in your pee for more than 2 weeks after the procedure.  Have blood in your semen for more than 2 months after the procedure.  Have problems getting an erection.  Feel sick to your stomach (nauseous).  Throw up (vomit).  Have new or worse bleeding in your pee, poop, or semen. Get help right away if you:  Have a fever or chills.  Have bright red pee.  Have very bad pain that does not get better with medicine.  Cannot pee. Summary  After this procedure, it is common to have pain and discomfort around your butt, especially while sitting.  You may have blood in your pee and poop.  It is common to have blood in your semen for 1-2 months.  If you were prescribed antibiotic medicine, take it as told by your doctor. Do not stop taking the antibiotic even if you start to feel better.  Get help right away if you have a fever or chills. This information is not intended to replace advice given to you by your health care provider. Make sure you discuss any questions you have with your health care provider. Document Released: 03/08/2017 Document Revised: 08/30/2018 Document Reviewed:  03/08/2017 Elsevier Patient Education  2020 [employer].

## 2019-03-21 ENCOUNTER — Encounter: Payer: Self-pay | Admitting: Urology

## 2019-03-21 LAB — SURGICAL PATHOLOGY

## 2019-03-26 DIAGNOSIS — C61 Malignant neoplasm of prostate: Secondary | ICD-10-CM | POA: Diagnosis not present

## 2019-03-26 DIAGNOSIS — R972 Elevated prostate specific antigen [PSA]: Secondary | ICD-10-CM | POA: Diagnosis not present

## 2019-03-26 DIAGNOSIS — D400 Neoplasm of uncertain behavior of prostate: Secondary | ICD-10-CM | POA: Diagnosis not present

## 2019-04-02 DIAGNOSIS — M47812 Spondylosis without myelopathy or radiculopathy, cervical region: Secondary | ICD-10-CM | POA: Diagnosis not present

## 2019-04-02 DIAGNOSIS — Z79891 Long term (current) use of opiate analgesic: Secondary | ICD-10-CM | POA: Diagnosis not present

## 2019-04-02 DIAGNOSIS — M4802 Spinal stenosis, cervical region: Secondary | ICD-10-CM | POA: Diagnosis not present

## 2019-04-02 DIAGNOSIS — G894 Chronic pain syndrome: Secondary | ICD-10-CM | POA: Diagnosis not present

## 2019-04-03 DIAGNOSIS — C61 Malignant neoplasm of prostate: Secondary | ICD-10-CM | POA: Diagnosis not present

## 2019-04-03 DIAGNOSIS — R972 Elevated prostate specific antigen [PSA]: Secondary | ICD-10-CM | POA: Diagnosis not present

## 2019-04-03 DIAGNOSIS — D400 Neoplasm of uncertain behavior of prostate: Secondary | ICD-10-CM | POA: Diagnosis not present

## 2019-04-15 ENCOUNTER — Encounter: Payer: Self-pay | Admitting: Internal Medicine

## 2019-04-22 DIAGNOSIS — L2089 Other atopic dermatitis: Secondary | ICD-10-CM | POA: Diagnosis not present

## 2019-05-07 ENCOUNTER — Other Ambulatory Visit: Payer: Self-pay | Admitting: Internal Medicine

## 2019-05-07 DIAGNOSIS — C61 Malignant neoplasm of prostate: Secondary | ICD-10-CM | POA: Diagnosis not present

## 2019-05-31 ENCOUNTER — Telehealth: Payer: Self-pay | Admitting: Internal Medicine

## 2019-05-31 ENCOUNTER — Ambulatory Visit (INDEPENDENT_AMBULATORY_CARE_PROVIDER_SITE_OTHER): Payer: Medicare Other | Admitting: Internal Medicine

## 2019-05-31 ENCOUNTER — Other Ambulatory Visit: Payer: Self-pay

## 2019-05-31 VITALS — Ht 70.0 in | Wt 163.0 lb

## 2019-05-31 DIAGNOSIS — C61 Malignant neoplasm of prostate: Secondary | ICD-10-CM

## 2019-05-31 DIAGNOSIS — Z125 Encounter for screening for malignant neoplasm of prostate: Secondary | ICD-10-CM

## 2019-05-31 DIAGNOSIS — I700 Atherosclerosis of aorta: Secondary | ICD-10-CM | POA: Diagnosis not present

## 2019-05-31 DIAGNOSIS — R413 Other amnesia: Secondary | ICD-10-CM

## 2019-05-31 DIAGNOSIS — M542 Cervicalgia: Secondary | ICD-10-CM | POA: Diagnosis not present

## 2019-05-31 DIAGNOSIS — E7800 Pure hypercholesterolemia, unspecified: Secondary | ICD-10-CM

## 2019-05-31 DIAGNOSIS — R739 Hyperglycemia, unspecified: Secondary | ICD-10-CM

## 2019-05-31 DIAGNOSIS — E559 Vitamin D deficiency, unspecified: Secondary | ICD-10-CM

## 2019-05-31 DIAGNOSIS — G8929 Other chronic pain: Secondary | ICD-10-CM

## 2019-05-31 DIAGNOSIS — D649 Anemia, unspecified: Secondary | ICD-10-CM

## 2019-05-31 NOTE — Telephone Encounter (Signed)
Lm for pt to call back and schedule fasting labs in 2-3 weeks and a 75m follow up

## 2019-05-31 NOTE — Progress Notes (Signed)
Patient ID: Mike Farley, male   DOB: 1950/07/30, 69 y.o.   MRN: [id number]   Virtual Visit via video Note  This visit type was conducted due to national recommendations for restrictions regarding the COVID-19 pandemic (e.g. social distancing).  This format is felt to be most appropriate for this patient at this time.  All issues noted in this document were discussed and addressed.  No physical exam was performed (except for noted visual exam findings with Video Visits).   I connected with Mike Farley by a video enabled telemedicine application and verified that I am speaking with the correct person using two identifiers. Location patient: home Location provider: work  Persons participating in the virtual visit: patient, provider  The limitations, risks, security and privacy concerns of performing an evaluation and management service by telephone and the availability of in person appointments have been discussed. The patient expressed understanding and agreed to proceed.   Reason for visit: scheduled follow up.    HPI: He reports he is doing relatively well.  Recently diagnosed with prostate cancer.  Elected to follow.  Recommended MRI and PSA in 6 months.  Tries to stay active.  No chest pain.  No sob.  No acid reflux reported.  Increased neck pain.  Planning to f/u with [facility].  Has another torn rotator cuff.  Plans to f/u at [facility].  No abdominal pain or bowel change reported.  Still has some concerns regarding his memory.  Taking B12. Discussed formal neurological evaluation.     ROS: See pertinent positives and negatives per HPI.  Past Medical History:  Diagnosis Date  . Cervical pain (neck)    limited mobility  . Chronic pain   . Depression    years ago (minor)    Past Surgical History:  Procedure Laterality Date  . ANTERIOR FUSION CERVICAL SPINE  2011   4-5 levels  . APPENDECTOMY    . BONE EXCISION Left 09/28/2017   Procedure: BONE EXCISION-TAILORS  EXOSTECTOMY;   Surgeon: Samara Deist, DPM;  Location: [place];  Service: Podiatry;  Laterality: Left;  IVA LOCAL  . CERVICAL DISCECTOMY  2008   posterior  . CHOLECYSTECTOMY    . ELBOW FRACTURE SURGERY Left 2004  . PROSTATE BIOPSY N/A 03/15/2019   Procedure: PROSTATE BIOPSY Uro Nav;  Surgeon: Royston Cowper, MD;  Location: [facility];  Service: Urology;  Laterality: N/A;  . SHOULDER SURGERY Right   . TONSILLECTOMY AND ADENOIDECTOMY    . TOTAL KNEE ARTHROPLASTY Right 2005   x 4    Family History  Problem Relation Age of Onset  . Arthritis Mother   . Cancer Mother        colon & breast cnacer  . Mental illness Mother        Depression & bipolar  . Cancer Father        lung & prostate cancer  . Alcohol abuse Brother   . Kidney disease Brother     SOCIAL HX: reviewed.    Current Outpatient Medications:  .  cholecalciferol (VITAMIN D3) 25 MCG (1000 UT) tablet, Take 1,000 Units by mouth 2 (two) times a week., Disp: , Rfl:  .  levofloxacin (LEVAQUIN) 500 MG tablet, Take 1 tablet (500 mg total) by mouth daily., Disp: 7 tablet, Rfl: 1 .  Multiple Vitamin (MULTIVITAMIN) tablet, Take 1 tablet by mouth daily., Disp: , Rfl:  .  Oxycodone HCl 20 MG TABS, Take 20 mg by mouth 5 (five) times daily. , Disp: ,  Rfl:  .  oxymetazoline (AFRIN) 0.05 % nasal spray, Place 1 spray into both nostrils at bedtime., Disp: , Rfl:  .  QUEtiapine (SEROQUEL) 300 MG tablet, TAKE 1 TABLET BY MOUTH AT BEDTIME, Disp: 90 tablet, Rfl: 1 .  rosuvastatin (CRESTOR) 10 MG tablet, TAKE ONE TABLET BY MOUTH EVERY DAY (Patient taking differently: Take 10 mg by mouth daily. ), Disp: 30 tablet, Rfl: 5 .  valACYclovir (VALTREX) 500 MG tablet, Take 1 tablet (500 mg total) by mouth 2 (two) times daily. (Patient not taking: Reported on 03/15/2019), Disp: 20 tablet, Rfl: 0 .  vitamin B-12 (CYANOCOBALAMIN) 500 MCG tablet, Take 500 mcg by mouth daily., Disp: , Rfl:   EXAM:  GENERAL: alert, oriented, appears well and in no acute  distress  HEENT: atraumatic, conjunttiva clear, no obvious abnormalities on inspection of external nose and ears  NECK: normal movements of the head and neck  LUNGS: on inspection no signs of respiratory distress, breathing rate appears normal, no obvious gross SOB, gasping or wheezing  CV: no obvious cyanosis  PSYCH/NEURO: pleasant and cooperative, no obvious depression or anxiety, speech and thought processing grossly intact  ASSESSMENT AND PLAN:  Discussed the following assessment and plan:  Aortic atherosclerosis ([place]) On crestor.    Chronic neck pain (Primary Area of Pain) Neck pain is worse. Plans to f/u with [facility].    Hypercholesteremia Low cholesterol diet and exercise. On crestor.   Follow lipid panel and liver function tests.    Hyperglycemia Low carb diet and exercise.  Follow met b and a1c.   Memory change Taking b12.  Discussed formal neurological evaluation.  Will notify me if wants to pursue.    Prostate cancer [facility]) Recently diagnosed with prostate cancer.  Seeing oncology.  Elected - active surveillance. F/u MRI and PSA in 6 months.  Request to have psa checked with our next labs    Vitamin D insufficiency Follow vitamin D level.    Anemia Recent hgb - slightly decreased.  Check cbc and ferritin.     Orders Placed This Encounter  Procedures  . Lipid panel    Standing Status:   Future    Standing Expiration Date:   05/30/2020  . Hepatic function panel    Standing Status:   Future    Standing Expiration Date:   05/30/2020  . PSA, Medicare    Standing Status:   Future    Standing Expiration Date:   05/30/2020  . Ferritin    Standing Status:   Future    Standing Expiration Date:   06/02/2020  . CBC with Differential/Platelet    Standing Status:   Future    Standing Expiration Date:   06/02/2020  . Hemoglobin A1c    Standing Status:   Future    Standing Expiration Date:   06/02/2020  . Basic metabolic panel (future)    Standing Status:   Future     Standing Expiration Date:   06/02/2020      I discussed the assessment and treatment plan with the patient. The patient was provided an opportunity to ask questions and all were answered. The patient agreed with the plan and demonstrated an understanding of the instructions.   The patient was advised to call back or seek an in-person evaluation if the symptoms worsen or if the condition fails to improve as anticipated.   Einar Pheasant, MD

## 2019-05-31 NOTE — Progress Notes (Signed)
Patient ID: Mike Farley, male   DOB: 13-Sep-1950, 69 y.o.   MRN: [id number]   Virtual Visit via video Note  This visit type was conducted due to national recommendations for restrictions regarding the COVID-19 pandemic (e.g. social distancing).  This format is felt to be most appropriate for this patient at this time.  All issues noted in this document were discussed and addressed.  No physical exam was performed (except for noted visual exam findings with Video Visits).   I connected with Mike Farley today by a video enabled telemedicine application and verified that I am speaking with the correct person using two identifiers. Location patient: home Location provider: work Persons participating in the virtual visit: patient, provider  The limitations, risks, security and privacy concerns of performing an evaluation and management service by video and the availability of in person appointments have been discussed.   The patient expressed understanding and agreed to proceed.  Reason for visit: scheduled follow up.   HPI: Recently diagnosed with prostate cancer.  Seeing oncology.  Decided on following with MRI in 6 months and repeat PSA in 6 months.  No urinary problems.  No chest pain or sob reported.  Still having neck and back pain.  Planning to f/u with neurosurgery to discuss.  Also has another torn rotator cuff.  Eating well.  No nausea, abdominal pain or bowel change reported.  Saw Dr Jeanmarie Hubert last year.  Had CT 06/07/18.  Was questioning if had f/u CT scan this year.  Tries to stay active.     ROS: See pertinent positives and negatives per HPI.  Past Medical History:  Diagnosis Date  . Cervical pain (neck)    limited mobility  . Chronic pain   . Depression    years ago (minor)    Past Surgical History:  Procedure Laterality Date  . ANTERIOR FUSION CERVICAL SPINE  2011   4-5 levels  . APPENDECTOMY    . BONE EXCISION Left 09/28/2017   Procedure: BONE EXCISION-TAILORS   EXOSTECTOMY;  Surgeon: Samara Deist, DPM;  Location: [place];  Service: Podiatry;  Laterality: Left;  IVA LOCAL  . CERVICAL DISCECTOMY  2008   posterior  . CHOLECYSTECTOMY    . ELBOW FRACTURE SURGERY Left 2004  . PROSTATE BIOPSY N/A 03/15/2019   Procedure: PROSTATE BIOPSY Uro Nav;  Surgeon: Royston Cowper, MD;  Location: [facility];  Service: Urology;  Laterality: N/A;  . SHOULDER SURGERY Right   . TONSILLECTOMY AND ADENOIDECTOMY    . TOTAL KNEE ARTHROPLASTY Right 2005   x 4    Family History  Problem Relation Age of Onset  . Arthritis Mother   . Cancer Mother        colon & breast cnacer  . Mental illness Mother        Depression & bipolar  . Cancer Father        lung & prostate cancer  . Alcohol abuse Brother   . Kidney disease Brother     SOCIAL HX: reviewed.    Current Outpatient Medications:  .  cholecalciferol (VITAMIN D3) 25 MCG (1000 UT) tablet, Take 1,000 Units by mouth 2 (two) times a week., Disp: , Rfl:  .  levofloxacin (LEVAQUIN) 500 MG tablet, Take 1 tablet (500 mg total) by mouth daily., Disp: 7 tablet, Rfl: 1 .  Multiple Vitamin (MULTIVITAMIN) tablet, Take 1 tablet by mouth daily., Disp: , Rfl:  .  Oxycodone HCl 20 MG TABS, Take 20 mg by mouth  5 (five) times daily. , Disp: , Rfl:  .  oxymetazoline (AFRIN) 0.05 % nasal spray, Place 1 spray into both nostrils at bedtime., Disp: , Rfl:  .  QUEtiapine (SEROQUEL) 300 MG tablet, TAKE 1 TABLET BY MOUTH AT BEDTIME, Disp: 90 tablet, Rfl: 1 .  rosuvastatin (CRESTOR) 10 MG tablet, TAKE ONE TABLET BY MOUTH EVERY DAY (Patient taking differently: Take 10 mg by mouth daily. ), Disp: 30 tablet, Rfl: 5 .  valACYclovir (VALTREX) 500 MG tablet, Take 1 tablet (500 mg total) by mouth 2 (two) times daily. (Patient not taking: Reported on 03/15/2019), Disp: 20 tablet, Rfl: 0 .  vitamin B-12 (CYANOCOBALAMIN) 500 MCG tablet, Take 500 mcg by mouth daily., Disp: , Rfl:   EXAM:  GENERAL: alert, oriented, appears well and in  no acute distress  HEENT: atraumatic, conjunttiva clear, no obvious abnormalities on inspection of external nose and ears  NECK: normal movements of the head and neck  LUNGS: on inspection no signs of respiratory distress, breathing rate appears normal, no obvious gross SOB, gasping or wheezing  CV: no obvious cyanosis  PSYCH/NEURO: pleasant and cooperative, no obvious depression or anxiety, speech and thought processing grossly intact  ASSESSMENT AND PLAN:  Discussed the following assessment and plan:  Aortic atherosclerosis ([place]) On crestor.    Chronic neck pain (Primary Area of Pain) Neck pain is worse. Plans to f/u with [facility].    Hypercholesteremia Low cholesterol diet and exercise. On crestor.   Follow lipid panel and liver function tests.    Hyperglycemia Low carb diet and exercise.  Follow met b and a1c.   Memory change Taking b12.  Discussed formal neurological evaluation.  Will notify me if wants to pursue.    Prostate cancer [facility]) Recently diagnosed with prostate cancer.  Seeing oncology.  Elected - active surveillance. F/u MRI and PSA in 6 months.  Request to have psa checked with our next labs    Vitamin D insufficiency Follow vitamin D level.    Anemia Recent hgb - slightly decreased.  Check cbc and ferritin.     Orders Placed This Encounter  Procedures  . Lipid panel    Standing Status:   Future    Standing Expiration Date:   05/30/2020  . Hepatic function panel    Standing Status:   Future    Standing Expiration Date:   05/30/2020  . PSA, Medicare    Standing Status:   Future    Standing Expiration Date:   05/30/2020  . Ferritin    Standing Status:   Future    Standing Expiration Date:   06/02/2020  . CBC with Differential/Platelet    Standing Status:   Future    Standing Expiration Date:   06/02/2020  . Hemoglobin A1c    Standing Status:   Future    Standing Expiration Date:   06/02/2020  . Basic metabolic panel (future)    Standing Status:   Future     Standing Expiration Date:   06/02/2020     I discussed the assessment and treatment plan with the patient. The patient was provided an opportunity to ask questions and all were answered. The patient agreed with the plan and demonstrated an understanding of the instructions.   The patient was advised to call back or seek an in-person evaluation if the symptoms worsen or if the condition fails to improve as anticipated.   Einar Pheasant, MD

## 2019-06-03 ENCOUNTER — Encounter: Payer: Self-pay | Admitting: Internal Medicine

## 2019-06-03 DIAGNOSIS — D649 Anemia, unspecified: Secondary | ICD-10-CM | POA: Insufficient documentation

## 2019-06-03 NOTE — Assessment & Plan Note (Signed)
Low cholesterol diet and exercise.  On crestor.  Follow lipid panel and liver function tests.  

## 2019-06-03 NOTE — Assessment & Plan Note (Signed)
On crestor.   

## 2019-06-03 NOTE — Assessment & Plan Note (Signed)
Low carb diet and exercise.  Follow met b and a1c.  

## 2019-06-03 NOTE — Assessment & Plan Note (Signed)
Recently diagnosed with prostate cancer.  Seeing oncology.  Elected - active surveillance. F/u MRI and PSA in 6 months.  Request to have psa checked with our next labs

## 2019-06-03 NOTE — Assessment & Plan Note (Signed)
Follow vitamin D level.  

## 2019-06-03 NOTE — Assessment & Plan Note (Signed)
Taking b12.  Discussed formal neurological evaluation.  Will notify me if wants to pursue.

## 2019-06-03 NOTE — Assessment & Plan Note (Signed)
Neck pain is worse. Plans to f/u with [facility].

## 2019-06-03 NOTE — Assessment & Plan Note (Signed)
Recent hgb - slightly decreased.  Check cbc and ferritin.

## 2019-06-18 ENCOUNTER — Other Ambulatory Visit: Payer: Self-pay

## 2019-06-20 ENCOUNTER — Other Ambulatory Visit: Payer: Self-pay

## 2019-06-20 ENCOUNTER — Other Ambulatory Visit (INDEPENDENT_AMBULATORY_CARE_PROVIDER_SITE_OTHER): Payer: Medicare Other

## 2019-06-20 DIAGNOSIS — E7800 Pure hypercholesterolemia, unspecified: Secondary | ICD-10-CM

## 2019-06-20 DIAGNOSIS — R739 Hyperglycemia, unspecified: Secondary | ICD-10-CM

## 2019-06-20 DIAGNOSIS — Z125 Encounter for screening for malignant neoplasm of prostate: Secondary | ICD-10-CM

## 2019-06-20 DIAGNOSIS — D649 Anemia, unspecified: Secondary | ICD-10-CM

## 2019-06-20 LAB — LIPID PANEL
Cholesterol: 165 mg/dL (ref 0–200)
HDL: 78.2 mg/dL (ref >39.00)
LDL Cholesterol: 71 mg/dL (ref 0–99)
NonHDL: 86.64
Total CHOL/HDL Ratio: 2
Triglycerides: 78 mg/dL (ref 0.0–149.0)
VLDL: 15.6 mg/dL (ref 0.0–40.0)

## 2019-06-20 LAB — BASIC METABOLIC PANEL
BUN: 14 mg/dL (ref 6–23)
CO2: 29 mEq/L (ref 19–32)
Calcium: 9.5 mg/dL (ref 8.4–10.5)
Chloride: 101 mEq/L (ref 96–112)
Creatinine, Ser: 0.78 mg/dL (ref 0.40–1.50)
GFR: 98.68 mL/min (ref >60.00)
Glucose, Bld: 103 mg/dL — ABNORMAL HIGH (ref 70–99)
Potassium: 3.8 mEq/L (ref 3.5–5.1)
Sodium: 138 mEq/L (ref 135–145)

## 2019-06-20 LAB — CBC WITH DIFFERENTIAL/PLATELET
Basophils Absolute: 0 10*3/uL (ref 0.0–0.1)
Basophils Relative: 0.3 % (ref 0.0–3.0)
Eosinophils Absolute: 0.2 10*3/uL (ref 0.0–0.7)
Eosinophils Relative: 2.8 % (ref 0.0–5.0)
HCT: 39.6 % (ref 39.0–52.0)
Hemoglobin: 13.2 g/dL (ref 13.0–17.0)
Lymphocytes Relative: 29.9 % (ref 12.0–46.0)
Lymphs Abs: 1.7 10*3/uL (ref 0.7–4.0)
MCHC: 33.4 g/dL (ref 30.0–36.0)
MCV: 90.9 fl (ref 78.0–100.0)
Monocytes Absolute: 0.7 10*3/uL (ref 0.1–1.0)
Monocytes Relative: 12.8 % — ABNORMAL HIGH (ref 3.0–12.0)
Neutro Abs: 3.1 10*3/uL (ref 1.4–7.7)
Neutrophils Relative %: 54.2 % (ref 43.0–77.0)
Platelets: 242 10*3/uL (ref 150.0–400.0)
RBC: 4.36 Mil/uL (ref 4.22–5.81)
RDW: 14.2 % (ref 11.5–15.5)
WBC: 5.8 10*3/uL (ref 4.0–10.5)

## 2019-06-20 LAB — HEPATIC FUNCTION PANEL
ALT: 13 U/L (ref 0–53)
AST: 22 U/L (ref 0–37)
Albumin: 4.5 g/dL (ref 3.5–5.2)
Alkaline Phosphatase: 93 U/L (ref 39–117)
Bilirubin, Direct: 0.1 mg/dL (ref 0.0–0.3)
Total Bilirubin: 0.3 mg/dL (ref 0.2–1.2)
Total Protein: 7.2 g/dL (ref 6.0–8.3)

## 2019-06-20 LAB — PSA, MEDICARE: PSA: 7 ng/ml — ABNORMAL HIGH (ref 0.10–4.00)

## 2019-06-20 LAB — HEMOGLOBIN A1C: Hgb A1c MFr Bld: 5.7 % (ref 4.6–6.5)

## 2019-06-20 LAB — FERRITIN: Ferritin: 44.3 ng/mL (ref 22.0–322.0)

## 2019-06-25 ENCOUNTER — Other Ambulatory Visit: Payer: Self-pay | Admitting: Internal Medicine

## 2019-07-10 DIAGNOSIS — K2100 Gastro-esophageal reflux disease with esophagitis, without bleeding: Secondary | ICD-10-CM | POA: Diagnosis not present

## 2019-07-10 DIAGNOSIS — R933 Abnormal findings on diagnostic imaging of other parts of digestive tract: Secondary | ICD-10-CM | POA: Diagnosis not present

## 2019-07-10 DIAGNOSIS — K2950 Unspecified chronic gastritis without bleeding: Secondary | ICD-10-CM | POA: Diagnosis not present

## 2019-07-10 DIAGNOSIS — K838 Other specified diseases of biliary tract: Secondary | ICD-10-CM | POA: Diagnosis not present

## 2019-08-07 DIAGNOSIS — S01111A Laceration without foreign body of right eyelid and periocular area, initial encounter: Secondary | ICD-10-CM | POA: Diagnosis not present

## 2019-08-13 ENCOUNTER — Telehealth: Payer: Self-pay | Admitting: Internal Medicine

## 2019-08-13 NOTE — Telephone Encounter (Signed)
Please forward PSA to [place] [facility] Dr Maryan Puls

## 2019-08-13 NOTE — Telephone Encounter (Signed)
Labs resent to Dr Yves Dill.

## 2019-08-24 ENCOUNTER — Encounter: Payer: Self-pay | Admitting: Internal Medicine

## 2019-10-08 DIAGNOSIS — M4802 Spinal stenosis, cervical region: Secondary | ICD-10-CM | POA: Diagnosis not present

## 2019-10-08 DIAGNOSIS — G894 Chronic pain syndrome: Secondary | ICD-10-CM | POA: Diagnosis not present

## 2019-10-08 DIAGNOSIS — M47812 Spondylosis without myelopathy or radiculopathy, cervical region: Secondary | ICD-10-CM | POA: Diagnosis not present

## 2019-10-08 DIAGNOSIS — Z79891 Long term (current) use of opiate analgesic: Secondary | ICD-10-CM | POA: Diagnosis not present

## 2019-10-09 DIAGNOSIS — D2271 Melanocytic nevi of right lower limb, including hip: Secondary | ICD-10-CM | POA: Diagnosis not present

## 2019-10-09 DIAGNOSIS — D2261 Melanocytic nevi of right upper limb, including shoulder: Secondary | ICD-10-CM | POA: Diagnosis not present

## 2019-10-09 DIAGNOSIS — D2272 Melanocytic nevi of left lower limb, including hip: Secondary | ICD-10-CM | POA: Diagnosis not present

## 2019-10-09 DIAGNOSIS — L821 Other seborrheic keratosis: Secondary | ICD-10-CM | POA: Diagnosis not present

## 2019-10-09 DIAGNOSIS — D400 Neoplasm of uncertain behavior of prostate: Secondary | ICD-10-CM | POA: Diagnosis not present

## 2019-10-09 DIAGNOSIS — R972 Elevated prostate specific antigen [PSA]: Secondary | ICD-10-CM | POA: Diagnosis not present

## 2019-10-09 DIAGNOSIS — D2262 Melanocytic nevi of left upper limb, including shoulder: Secondary | ICD-10-CM | POA: Diagnosis not present

## 2019-10-09 DIAGNOSIS — C61 Malignant neoplasm of prostate: Secondary | ICD-10-CM | POA: Diagnosis not present

## 2019-10-09 DIAGNOSIS — L570 Actinic keratosis: Secondary | ICD-10-CM | POA: Diagnosis not present

## 2019-10-09 DIAGNOSIS — D225 Melanocytic nevi of trunk: Secondary | ICD-10-CM | POA: Diagnosis not present

## 2019-10-11 ENCOUNTER — Ambulatory Visit: Payer: Medicare Other | Admitting: Internal Medicine

## 2019-10-11 ENCOUNTER — Ambulatory Visit: Payer: Medicare Other

## 2019-10-30 ENCOUNTER — Ambulatory Visit (INDEPENDENT_AMBULATORY_CARE_PROVIDER_SITE_OTHER): Payer: Medicare Other | Admitting: Internal Medicine

## 2019-10-30 ENCOUNTER — Other Ambulatory Visit: Payer: Self-pay

## 2019-10-30 ENCOUNTER — Ambulatory Visit: Payer: Medicare Other

## 2019-10-30 ENCOUNTER — Encounter: Payer: Self-pay | Admitting: Internal Medicine

## 2019-10-30 DIAGNOSIS — E7800 Pure hypercholesterolemia, unspecified: Secondary | ICD-10-CM | POA: Diagnosis not present

## 2019-10-30 DIAGNOSIS — E559 Vitamin D deficiency, unspecified: Secondary | ICD-10-CM | POA: Diagnosis not present

## 2019-10-30 DIAGNOSIS — R413 Other amnesia: Secondary | ICD-10-CM | POA: Diagnosis not present

## 2019-10-30 DIAGNOSIS — D649 Anemia, unspecified: Secondary | ICD-10-CM | POA: Diagnosis not present

## 2019-10-30 DIAGNOSIS — C61 Malignant neoplasm of prostate: Secondary | ICD-10-CM

## 2019-10-30 DIAGNOSIS — R739 Hyperglycemia, unspecified: Secondary | ICD-10-CM

## 2019-10-30 DIAGNOSIS — G479 Sleep disorder, unspecified: Secondary | ICD-10-CM

## 2019-10-30 DIAGNOSIS — I700 Atherosclerosis of aorta: Secondary | ICD-10-CM

## 2019-10-30 NOTE — Progress Notes (Signed)
Patient ID: Mike Farley, male   DOB: 08-Mar-1951, 69 y.o.   MRN: [id number]   Subjective:    Patient ID: Mike Farley, male    DOB: Jun 26, 1950, 69 y.o.   MRN: [id number]  HPI This visit occurred during the SARS-CoV-2 public health emergency.  Safety protocols were in place, including screening questions prior to the visit, additional usage of staff PPE, and extensive cleaning of exam room while observing appropriate contact time as indicated for disinfecting solutions.  Patient here for a scheduled follow up.  He reports he is doing relatively well.  Tripped and fell 09/2019.  S/p superficial laceration to his right eyebrow.  S/p sutures.  Doing well now.  Tries to stay active.  No chest pain or sob.  No acid reflux reported.  No abdominal pain or cramping.  Bowels stable.  Has f/u planned with urology - f/u prostate cancer.  Planning for f/u biopsy and f/u psa.  Request refill for seroquel  Takes regularly.  Doing well on seroquel.  Is concerned regarding his memory.  Feels he has noticed some change in his memory.  Noticed he had problems remembering how to get to a friends house.  No other significant memory issues.    Past Medical History:  Diagnosis Date  . Cervical pain (neck)    limited mobility  . Chronic pain   . Depression    years ago (minor)   Past Surgical History:  Procedure Laterality Date  . ANTERIOR FUSION CERVICAL SPINE  2011   4-5 levels  . APPENDECTOMY    . BONE EXCISION Left 09/28/2017   Procedure: BONE EXCISION-TAILORS  EXOSTECTOMY;  Surgeon: Samara Deist, DPM;  Location: [place];  Service: Podiatry;  Laterality: Left;  IVA LOCAL  . CERVICAL DISCECTOMY  2008   posterior  . CHOLECYSTECTOMY    . ELBOW FRACTURE SURGERY Left 2004  . PROSTATE BIOPSY N/A 03/15/2019   Procedure: PROSTATE BIOPSY Uro Nav;  Surgeon: Royston Cowper, MD;  Location: [facility];  Service: Urology;  Laterality: N/A;  . SHOULDER SURGERY Right   . TONSILLECTOMY AND  ADENOIDECTOMY    . TOTAL KNEE ARTHROPLASTY Right 2005   x 4   Family History  Problem Relation Age of Onset  . Arthritis Mother   . Cancer Mother        colon & breast cnacer  . Mental illness Mother        Depression & bipolar  . Cancer Father        lung & prostate cancer  . Alcohol abuse Brother   . Kidney disease Brother    Social History   Socioeconomic History  . Marital status: Married    Spouse name: Not on file  . Number of children: Not on file  . Years of education: Not on file  . Highest education level: Not on file  Occupational History  . Not on file  Tobacco Use  . Smoking status: Former Research scientist (life sciences)  . Smokeless tobacco: Current User    Types: Chew  Vaping Use  . Vaping Use: Never used  Substance and Sexual Activity  . Alcohol use: Yes    Alcohol/week: 0.0 standard drinks    Comment: socially  . Drug use: Never  . Sexual activity: Not Currently  Other Topics Concern  . Not on file  Social History Narrative  . Not on file   Social Determinants of Health   Financial Resource Strain:   . Difficulty of Paying Living  Expenses:   Food Insecurity:   . Worried About Charity fundraiser in the Last Year:   . Arboriculturist in the Last Year:   Transportation Needs:   . Film/video editor (Medical):   Marland Kitchen Lack of Transportation (Non-Medical):   Physical Activity:   . Days of Exercise per Week:   . Minutes of Exercise per Session:   Stress:   . Feeling of Stress :   Social Connections:   . Frequency of Communication with Friends and Family:   . Frequency of Social Gatherings with Friends and Family:   . Attends Religious Services:   . Active Member of Clubs or Organizations:   . Attends Archivist Meetings:   Marland Kitchen Marital Status:     Outpatient Encounter Medications as of 10/30/2019  Medication Sig  . cholecalciferol (VITAMIN D3) 25 MCG (1000 UT) tablet Take 1,000 Units by mouth 2 (two) times a week.  . Multiple Vitamin (MULTIVITAMIN) tablet  Take 1 tablet by mouth daily.  . Oxycodone HCl 20 MG TABS Take 20 mg by mouth 5 (five) times daily.   Marland Kitchen oxymetazoline (AFRIN) 0.05 % nasal spray Place 1 spray into both nostrils at bedtime.  . rosuvastatin (CRESTOR) 10 MG tablet TAKE 1 TABLET BY MOUTH DAILY  . valACYclovir (VALTREX) 500 MG tablet Take 1 tablet (500 mg total) by mouth 2 (two) times daily. (Patient not taking: Reported on 03/15/2019)  . vitamin B-12 (CYANOCOBALAMIN) 500 MCG tablet Take 500 mcg by mouth daily.  . [DISCONTINUED] levofloxacin (LEVAQUIN) 500 MG tablet Take 1 tablet (500 mg total) by mouth daily.  . [DISCONTINUED] QUEtiapine (SEROQUEL) 300 MG tablet TAKE 1 TABLET BY MOUTH AT BEDTIME   No facility-administered encounter medications on file as of 10/30/2019.   Review of Systems  Constitutional: Negative for appetite change and unexpected weight change.  HENT: Negative for congestion and sinus pressure.   Respiratory: Negative for cough, chest tightness and shortness of breath.   Cardiovascular: Negative for chest pain, palpitations and leg swelling.  Gastrointestinal: Negative for abdominal pain, diarrhea, nausea and vomiting.  Genitourinary: Negative for difficulty urinating and dysuria.  Musculoskeletal: Negative for joint swelling and myalgias.  Skin: Negative for color change and rash.  Neurological: Negative for dizziness, light-headedness and headaches.  Psychiatric/Behavioral: Negative for agitation and dysphoric mood.       Objective:    Physical Exam Constitutional:      General: He is not in acute distress.    Appearance: Normal appearance. He is well-developed.  HENT:     Head: Normocephalic and atraumatic.     Right Ear: External ear normal.     Left Ear: External ear normal.  Eyes:     General: No scleral icterus.       Right eye: No discharge.        Left eye: No discharge.     Conjunctiva/sclera: Conjunctivae normal.  Cardiovascular:     Rate and Rhythm: Normal rate and regular rhythm.    Pulmonary:     Effort: Pulmonary effort is normal. No respiratory distress.     Breath sounds: Normal breath sounds.  Abdominal:     General: Bowel sounds are normal.     Palpations: Abdomen is soft.     Tenderness: There is no abdominal tenderness.  Musculoskeletal:        General: No swelling or tenderness.     Cervical back: Neck supple. No tenderness.  Lymphadenopathy:     Cervical: No  cervical adenopathy.  Skin:    Findings: No erythema or rash.  Neurological:     Mental Status: He is alert.  Psychiatric:        Mood and Affect: Mood normal.        Behavior: Behavior normal.     BP 130/76   Pulse 84   Temp (!) 97.4 F (36.3 C)   Resp 16   Ht 5' 10"  (1.778 m)   Wt 167 lb 9.6 oz (76 kg)   SpO2 99%   BMI 24.05 kg/m  Wt Readings from Last 3 Encounters:  11/02/19 167 lb (75.8 kg)  10/30/19 167 lb 9.6 oz (76 kg)  06/03/19 163 lb (73.9 kg)     Lab Results  Component Value Date   WBC 5.8 06/20/2019   HGB 13.2 06/20/2019   HCT 39.6 06/20/2019   PLT 242.0 06/20/2019   GLUCOSE 103 (H) 06/20/2019   CHOL 165 06/20/2019   TRIG 78.0 06/20/2019   HDL 78.20 06/20/2019   LDLCALC 71 06/20/2019   ALT 13 06/20/2019   AST 22 06/20/2019   NA 138 06/20/2019   K 3.8 06/20/2019   CL 101 06/20/2019   CREATININE 0.78 06/20/2019   BUN 14 06/20/2019   CO2 29 06/20/2019   TSH 2.68 01/24/2019   PSA 7.00 (H) 06/20/2019   INR 1.03 05/29/2009   HGBA1C 5.7 06/20/2019      Assessment & Plan:   Problem List Items Addressed This Visit    Anemia    Follow cbc.       Aortic atherosclerosis ([place])    On crestor.       Hypercholesteremia    On crestor.  Low cholesterol diet and exercise.  Follow lipid panel and liver function tests.        Relevant Orders   Hepatic function panel   Lipid panel   TSH   Basic metabolic panel   Hyperglycemia    Low carb diet and exercise.  Follow met b and a1c.       Relevant Orders   Hemoglobin A1c   Memory change    Discussed  memory issues as outlined.  Mini mental status - 30/30.  Follow B12.  Check routine labs.  Discussed could be a focus/concentration issue.  Follow.        Prostate cancer [facility])    Recently diagnosed with prostate cancer.  Seeing oncology.  Elected active surveillance.  Has f/u 11/12/19 - with plans for f/u psa and biopsy per pt.        Sleep difficulties    On seroquel.  Stable.  Follow.       Vitamin D insufficiency    Taking vitamin d supplements a few days per week.  Recheck vitamin d level next labs.            Einar Pheasant, MD

## 2019-11-02 ENCOUNTER — Other Ambulatory Visit: Payer: Self-pay | Admitting: Internal Medicine

## 2019-11-02 ENCOUNTER — Ambulatory Visit (INDEPENDENT_AMBULATORY_CARE_PROVIDER_SITE_OTHER): Payer: Medicare Other

## 2019-11-02 VITALS — Ht 70.0 in | Wt 167.0 lb

## 2019-11-02 DIAGNOSIS — Z0000 Encounter for general adult medical examination without abnormal findings: Secondary | ICD-10-CM | POA: Diagnosis not present

## 2019-11-02 NOTE — Patient Instructions (Addendum)
  Mr. Mike Farley , Thank you for taking time to come for your Medicare Wellness Visit. I appreciate your ongoing commitment to your health goals. Please review the following plan we discussed and let me know if I can assist you in the future.   These are the goals we discussed: Goals      Patient Stated   .  I would like to lose about 8-10lb (pt-stated)      Monitor diet and limit sugar intake       This is a list of the screening recommended for you and due dates:  Health Maintenance  Topic Date Due  . Flu Shot  12/23/2019  . Tetanus Vaccine  10/20/2025  . Colon Cancer Screening  06/24/2027  . COVID-19 Vaccine  Completed  .  Hepatitis C: One time screening is recommended by Center for Disease Control  (CDC) for  adults born from [birth year] through 1965.   Completed  . Pneumonia vaccines  Completed

## 2019-11-02 NOTE — Progress Notes (Addendum)
Subjective:   Mike Farley is a 69 y.o. male who presents for Medicare Annual/Subsequent preventive examination.  Review of Systems:  No ROS.  Medicare Wellness Virtual Visit.   Cardiac Risk Factors include: advanced age (>21men, >32 women)     Objective:    Vitals: Ht 5\' 10"  (1.778 m)   Wt 167 lb (75.8 kg)   BMI 23.96 kg/m   Body mass index is 23.96 kg/m.  Advanced Directives 11/02/2019 03/15/2019 03/08/2019 10/10/2018 09/28/2017 10/22/2016  Does Patient Have a Medical Advance Directive? Yes No No Yes Yes No  Type of Paramedic of [place];Living will - - Living will;Healthcare Power of [place];Living will -  Does patient want to make changes to medical advance directive? No - Patient declined - - No - Patient declined - -  Copy of [place] in Chart? No - copy requested - - No - copy requested No - copy requested -  Would patient like information on creating a medical advance directive? - No - Patient declined No - Patient declined - - No - Patient declined    Tobacco Social History   Tobacco Use  Smoking Status Former Smoker  Smokeless Tobacco Current User  . Types: Chew     Ready to quit: Not Answered Counseling given: Not Answered   Clinical Intake:  Pre-visit preparation completed: Yes        Diabetes: No  How often do you need to have someone help you when you read instructions, pamphlets, or other written materials from your doctor or pharmacy?: 1 - Never  Interpreter Needed?: No     Past Medical History:  Diagnosis Date  . Cervical pain (neck)    limited mobility  . Chronic pain   . Depression    years ago (minor)   Past Surgical History:  Procedure Laterality Date  . ANTERIOR FUSION CERVICAL SPINE  2011   4-5 levels  . APPENDECTOMY    . BONE EXCISION Left 09/28/2017   Procedure: BONE EXCISION-TAILORS  EXOSTECTOMY;  Surgeon: Samara Deist, DPM;  Location: [place];  Service: Podiatry;  Laterality: Left;  IVA LOCAL  . CERVICAL DISCECTOMY  2008   posterior  . CHOLECYSTECTOMY    . ELBOW FRACTURE SURGERY Left 2004  . PROSTATE BIOPSY N/A 03/15/2019   Procedure: PROSTATE BIOPSY Uro Nav;  Surgeon: Royston Cowper, MD;  Location: [facility];  Service: Urology;  Laterality: N/A;  . SHOULDER SURGERY Right   . TONSILLECTOMY AND ADENOIDECTOMY    . TOTAL KNEE ARTHROPLASTY Right 2005   x 4   Family History  Problem Relation Age of Onset  . Arthritis Mother   . Cancer Mother        colon & breast cnacer  . Mental illness Mother        Depression & bipolar  . Cancer Father        lung & prostate cancer  . Alcohol abuse Brother   . Kidney disease Brother    Social History   Socioeconomic History  . Marital status: Married    Spouse name: Not on file  . Number of children: Not on file  . Years of education: Not on file  . Highest education level: Not on file  Occupational History  . Not on file  Tobacco Use  . Smoking status: Former Research scientist (life sciences)  . Smokeless tobacco: Current User    Types: Chew  Vaping Use  . Vaping  Use: Never used  Substance and Sexual Activity  . Alcohol use: Yes    Alcohol/week: 0.0 standard drinks    Comment: socially  . Drug use: Never  . Sexual activity: Not Currently  Other Topics Concern  . Not on file  Social History Narrative  . Not on file   Social Determinants of Health   Financial Resource Strain:   . Difficulty of Paying Living Expenses:   Food Insecurity:   . Worried About Charity fundraiser in the Last Year:   . Arboriculturist in the Last Year:   Transportation Needs:   . Film/video editor (Medical):   Marland Kitchen Lack of Transportation (Non-Medical):   Physical Activity:   . Days of Exercise per Week:   . Minutes of Exercise per Session:   Stress:   . Feeling of Stress :   Social Connections:   . Frequency of Communication with Friends and Family:   . Frequency of Social Gatherings with  Friends and Family:   . Attends Religious Services:   . Active Member of Clubs or Organizations:   . Attends Archivist Meetings:   Marland Kitchen Marital Status:     Outpatient Encounter Medications as of 11/02/2019  Medication Sig  . cholecalciferol (VITAMIN D3) 25 MCG (1000 UT) tablet Take 1,000 Units by mouth 2 (two) times a week.  . Multiple Vitamin (MULTIVITAMIN) tablet Take 1 tablet by mouth daily.  . Oxycodone HCl 20 MG TABS Take 20 mg by mouth 5 (five) times daily.   Marland Kitchen oxymetazoline (AFRIN) 0.05 % nasal spray Place 1 spray into both nostrils at bedtime.  Marland Kitchen QUEtiapine (SEROQUEL) 300 MG tablet TAKE ONE TABLET AT BEDTIME  . rosuvastatin (CRESTOR) 10 MG tablet TAKE 1 TABLET BY MOUTH DAILY  . valACYclovir (VALTREX) 500 MG tablet Take 1 tablet (500 mg total) by mouth 2 (two) times daily. (Patient not taking: Reported on 03/15/2019)  . vitamin B-12 (CYANOCOBALAMIN) 500 MCG tablet Take 500 mcg by mouth daily.   No facility-administered encounter medications on file as of 11/02/2019.    Activities of Daily Living In your present state of health, do you have any difficulty performing the following activities: 11/02/2019 03/08/2019  Hearing? N N  Vision? N N  Difficulty concentrating or making decisions? Y N  Comment Notes difficulty with short term memory -  Walking or climbing stairs? N N  Dressing or bathing? N N  Doing errands, shopping? N N  Preparing Food and eating ? N -  Using the Toilet? N -  In the past six months, have you accidently leaked urine? N -  Do you have problems with loss of bowel control? N -  Managing your Medications? N -  Managing your Finances? N -  Comment Shared with wife -  Housekeeping or managing your Housekeeping? N -  Some recent data might be hidden    Patient Care Team: Einar Pheasant, MD as PCP - General (Internal Medicine)   Assessment:   This is a routine wellness examination for Mike Farley.  I connected with Adian today by telephone and  verified that I am speaking with the correct person using two identifiers. Location patient: home Location provider: work Persons participating in the virtual visit: patient, Marine scientist.    I discussed the limitations, risks, security and privacy concerns of performing an evaluation and management service by telephone and the availability of in person appointments. The patient expressed understanding and verbally consented to this telephonic visit.  Interactive audio and video telecommunications were attempted between this provider and patient, however failed, due to patient having technical difficulties OR patient did not have access to video capability.  We continued and completed visit with audio only.  Some vital signs may be absent or patient reported.   Health Maintenance Due: -See completed HM at the end of note.   Eye: Visual acuity not assessed. Virtual visit. Followed by their ophthalmologist.  Dental: UTD  Hearing: Demonstrates normal hearing during visit.  Safety:  Patient feels safe at home- yes Patient does have smoke detectors at home- yes Patient does wear sunscreen or protective clothing when in direct sunlight - yes Patient does wear seat belt when in a moving vehicle - yes Patient drives- yes Adequate lighting in walkways free from debris- yes Grab bars and handrails used as appropriate- yes Ambulates with an assistive device- no Cell phone on person when ambulating outside of the home- yes  Social: Alcohol intake - yes      Smoking history- former Tobacco use- plug/chew; currently using nicorette tablets to help with stop tobacco use. Encouraged to notify PMD if additional assistance is needed.  Smokers in home? none Illicit drug use? none  Medication: Taking as directed and without issues.  Pill box in use -yes  Self managed - yes   Covid-19: Precautions and sickness symptoms discussed. Wears mask, social distancing, hand hygiene as appropriate.    Activities of Daily Living Patient denies needing assistance with: household chores, feeding themselves, getting from bed to chair, getting to the toilet, bathing/showering, dressing, managing money, or preparing meals.   Discussed the importance of a healthy diet, water intake and the benefits of aerobic exercise.  Physical activity- walking 3-4 miles daily, bike riding 5-10 miles daily.  Diet:  High protein/Low carb Water: good intake  Other Providers Patient Care Team: Einar Pheasant, MD as PCP - General (Internal Medicine)  Exercise Activities and Dietary recommendations Current Exercise Habits: Home exercise routine, Type of exercise: walking;calisthenics, Frequency (Times/Week): 7, Intensity: Intense  Goals      Patient Stated   .  I would like to lose about 8-10lb (pt-stated)      Monitor diet and limit sugar intake       Fall Risk Fall Risk  11/02/2019 01/24/2019 07/10/2018 06/20/2018 10/06/2017  Falls in the past year? 1 0 0 0 No  Number falls in past yr: 0 - - - -  Injury with Fall? 0 - - - -  Comment Missed a step when walking, no injury. - - - -  Risk for fall due to : - Impaired vision - - -  Follow up Falls evaluation completed Falls evaluation completed - - -   Is the patient's home free of loose throw rugs in walkways, pet beds, electrical cords, etc?   Yes         Handrails on the stairs?  Yes      Adequate lighting?  Yes  Timed Get Up and Go Performed: No, virtual visit  Depression Screen PHQ 2/9 Scores 11/02/2019 01/24/2019 10/10/2018 07/10/2018  PHQ - 2 Score 0 0 0 0  PHQ- 9 Score - - 2 -    Cognitive Function Patient is alert and oriented x3. Patient notices changes in memory worsening. Plans to note changes as they arise and bring to next office visit to discuss with PMD for possible baseline test with Neurology. Patient likes to play word brain challenging games for brain health.   MMSE -  Mini Mental State Exam 10/06/2017  Orientation to time 5   Orientation to Place 5  Registration 3  Attention/ Calculation 5  Recall 3  Language- name 2 objects 2  Language- repeat 1  Language- follow 3 step command 3  Language- read & follow direction 1  Write a sentence 1  Copy design 1  Total score 30     6CIT Screen 11/02/2019  What Year? 0 points  What month? 0 points  Months in reverse 0 points  Repeat phrase 0 points    Immunization History  Administered Date(s) Administered  . Influenza-Unspecified 05/04/2017, 03/13/2018, 02/19/2019  . PFIZER SARS-COV-2 Vaccination 05/28/2019, 06/18/2019  . Pneumococcal Conjugate-13 06/27/2015  . Pneumococcal Polysaccharide-23 06/24/2017  . Tdap 10/21/2015  . Zoster Recombinat (Shingrix) 05/08/2019, 09/04/2019   Screening Tests Health Maintenance  Topic Date Due  . INFLUENZA VACCINE  12/23/2019  . TETANUS/TDAP  10/20/2025  . COLONOSCOPY  06/24/2027  . COVID-19 Vaccine  Completed  . Hepatitis C Screening  Completed  . PNA vac Low Risk Adult  Completed       Plan:   Keep all routine maintenance appointments.   Next scheduled lab 11/13/19 @ 9:30  Follow up 12/17/19 @ 11:00  Follow up 03/03/20 @ 11:30  Patient notices changes in short term memory worsening. Recent incident of not recalling directions to a place normally traveled while driving. Declines immediate appointment with provider. Plans to note changes as they arise and bring to next office visit to discuss with PMD for possible baseline test with Neurology.  Medicare Attestation I have personally reviewed: The patient's medical and social history Their use of alcohol, tobacco or illicit drugs Their current medications and supplements The patient's functional ability including ADLs,fall risks, home safety risks, cognitive, and hearing and visual impairment Diet and physical activities Evidence for depression   I have reviewed and discussed with patient certain preventive protocols, quality metrics, and best practice  recommendations.      Varney Biles, LPN  9/52/[id number]   Reviewed above information.  Agree with assessment and plan.  Saw pt in office.  Discussed memory issues.  Mental status exam:  30/30.  Discussed further w/up.   Dr Nicki Reaper

## 2019-11-06 ENCOUNTER — Encounter: Payer: Self-pay | Admitting: Internal Medicine

## 2019-11-06 NOTE — Assessment & Plan Note (Signed)
On seroquel.  Stable.  Follow.

## 2019-11-06 NOTE — Assessment & Plan Note (Signed)
Discussed memory issues as outlined.  Mini mental status - 30/30.  Follow B12.  Check routine labs.  Discussed could be a focus/concentration issue.  Follow.

## 2019-11-06 NOTE — Assessment & Plan Note (Signed)
Recently diagnosed with prostate cancer.  Seeing oncology.  Elected active surveillance.  Has f/u 11/12/19 - with plans for f/u psa and biopsy per pt.

## 2019-11-06 NOTE — Assessment & Plan Note (Signed)
Follow cbc.  

## 2019-11-06 NOTE — Assessment & Plan Note (Signed)
Low carb diet and exercise.  Follow met b and a1c.

## 2019-11-06 NOTE — Assessment & Plan Note (Signed)
Taking vitamin d supplements a few days per week.  Recheck vitamin d level next labs.

## 2019-11-06 NOTE — Assessment & Plan Note (Signed)
On crestor.   

## 2019-11-06 NOTE — Assessment & Plan Note (Signed)
On crestor.  Low cholesterol diet and exercise.  Follow lipid panel and liver function tests.   

## 2019-11-13 ENCOUNTER — Other Ambulatory Visit: Payer: Self-pay

## 2019-11-13 ENCOUNTER — Other Ambulatory Visit (INDEPENDENT_AMBULATORY_CARE_PROVIDER_SITE_OTHER): Payer: Medicare Other

## 2019-11-13 DIAGNOSIS — E7800 Pure hypercholesterolemia, unspecified: Secondary | ICD-10-CM | POA: Diagnosis not present

## 2019-11-13 DIAGNOSIS — R739 Hyperglycemia, unspecified: Secondary | ICD-10-CM

## 2019-11-13 LAB — HEPATIC FUNCTION PANEL
ALT: 15 U/L (ref 0–53)
AST: 21 U/L (ref 0–37)
Albumin: 4.5 g/dL (ref 3.5–5.2)
Alkaline Phosphatase: 102 U/L (ref 39–117)
Bilirubin, Direct: 0.1 mg/dL (ref 0.0–0.3)
Total Bilirubin: 0.4 mg/dL (ref 0.2–1.2)
Total Protein: 6.6 g/dL (ref 6.0–8.3)

## 2019-11-13 LAB — BASIC METABOLIC PANEL
BUN: 16 mg/dL (ref 6–23)
CO2: 32 mEq/L (ref 19–32)
Calcium: 9.7 mg/dL (ref 8.4–10.5)
Chloride: 105 mEq/L (ref 96–112)
Creatinine, Ser: 0.82 mg/dL (ref 0.40–1.50)
GFR: 93.03 mL/min (ref >60.00)
Glucose, Bld: 105 mg/dL — ABNORMAL HIGH (ref 70–99)
Potassium: 3.9 mEq/L (ref 3.5–5.1)
Sodium: 141 mEq/L (ref 135–145)

## 2019-11-13 LAB — LIPID PANEL
Cholesterol: 163 mg/dL (ref 0–200)
HDL: 68.3 mg/dL (ref >39.00)
LDL Cholesterol: 67 mg/dL (ref 0–99)
NonHDL: 94.21
Total CHOL/HDL Ratio: 2
Triglycerides: 138 mg/dL (ref 0.0–149.0)
VLDL: 27.6 mg/dL (ref 0.0–40.0)

## 2019-11-13 LAB — HEMOGLOBIN A1C: Hgb A1c MFr Bld: 5.6 % (ref 4.6–6.5)

## 2019-11-13 LAB — TSH: TSH: 2.31 u[IU]/mL (ref 0.35–4.50)

## 2019-11-28 ENCOUNTER — Ambulatory Visit: Payer: Medicare Other | Admitting: Internal Medicine

## 2019-12-10 DIAGNOSIS — G894 Chronic pain syndrome: Secondary | ICD-10-CM | POA: Diagnosis not present

## 2019-12-10 DIAGNOSIS — M47812 Spondylosis without myelopathy or radiculopathy, cervical region: Secondary | ICD-10-CM | POA: Diagnosis not present

## 2019-12-10 DIAGNOSIS — Z79891 Long term (current) use of opiate analgesic: Secondary | ICD-10-CM | POA: Diagnosis not present

## 2019-12-10 DIAGNOSIS — M4802 Spinal stenosis, cervical region: Secondary | ICD-10-CM | POA: Diagnosis not present

## 2019-12-14 ENCOUNTER — Other Ambulatory Visit: Payer: Self-pay | Admitting: Internal Medicine

## 2019-12-17 ENCOUNTER — Ambulatory Visit (INDEPENDENT_AMBULATORY_CARE_PROVIDER_SITE_OTHER): Payer: Medicare Other | Admitting: Internal Medicine

## 2019-12-17 ENCOUNTER — Other Ambulatory Visit: Payer: Self-pay

## 2019-12-17 DIAGNOSIS — D649 Anemia, unspecified: Secondary | ICD-10-CM | POA: Diagnosis not present

## 2019-12-17 DIAGNOSIS — G8929 Other chronic pain: Secondary | ICD-10-CM

## 2019-12-17 DIAGNOSIS — R413 Other amnesia: Secondary | ICD-10-CM | POA: Diagnosis not present

## 2019-12-17 DIAGNOSIS — I700 Atherosclerosis of aorta: Secondary | ICD-10-CM

## 2019-12-17 DIAGNOSIS — E559 Vitamin D deficiency, unspecified: Secondary | ICD-10-CM | POA: Diagnosis not present

## 2019-12-17 DIAGNOSIS — M542 Cervicalgia: Secondary | ICD-10-CM | POA: Diagnosis not present

## 2019-12-17 DIAGNOSIS — G479 Sleep disorder, unspecified: Secondary | ICD-10-CM

## 2019-12-17 DIAGNOSIS — E7800 Pure hypercholesterolemia, unspecified: Secondary | ICD-10-CM | POA: Diagnosis not present

## 2019-12-17 DIAGNOSIS — R030 Elevated blood-pressure reading, without diagnosis of hypertension: Secondary | ICD-10-CM

## 2019-12-17 DIAGNOSIS — R739 Hyperglycemia, unspecified: Secondary | ICD-10-CM

## 2019-12-17 NOTE — Progress Notes (Signed)
Patient ID: Mike Farley, male   DOB: [birth year], 69 y.o.   MRN: [id number]   Subjective:    Patient ID: Mike Farley, male    DOB: 09/16/1950, 69 y.o.   MRN: [id number]  HPI This visit occurred during the SARS-CoV-2 public health emergency.  Safety protocols were in place, including screening questions prior to the visit, additional usage of staff PPE, and extensive cleaning of exam room while observing appropriate contact time as indicated for disinfecting solutions.  Patient here for a scheduled follow up.  He reports he is doing relatively well.  Trying to stay active.  Increased neck pain.  Due to see Dr Deirdre Peer 12/31/19.  He is walking 2-3 miles 4-5x/week.  No chest pain or sob with increased activity or exertion.  No cough or congestion.  No abdominal pain.  Bowels moving.  No acid reflux reported.  Seeing urology - f/u prostate cancer.  Is still concerned regarding memory.  Described previous episode of driving and could not remember directions.  States wife has noticed a change.    Past Medical History:  Diagnosis Date  . Cervical pain (neck)    limited mobility  . Chronic pain   . Depression    years ago (minor)   Past Surgical History:  Procedure Laterality Date  . ANTERIOR FUSION CERVICAL SPINE  2011   4-5 levels  . APPENDECTOMY    . BONE EXCISION Left 09/28/2017   Procedure: BONE EXCISION-TAILORS  EXOSTECTOMY;  Surgeon: Samara Deist, DPM;  Location: [place];  Service: Podiatry;  Laterality: Left;  IVA LOCAL  . CERVICAL DISCECTOMY  2008   posterior  . CHOLECYSTECTOMY    . ELBOW FRACTURE SURGERY Left 2004  . PROSTATE BIOPSY N/A 03/15/2019   Procedure: PROSTATE BIOPSY [facility];  Surgeon: Royston Cowper, MD;  Location: [facility];  Service: Urology;  Laterality: N/A;  . SHOULDER SURGERY Right   . TONSILLECTOMY AND ADENOIDECTOMY    . TOTAL KNEE ARTHROPLASTY Right 2005   x 4   Family History  Problem Relation Age of Onset  . Arthritis Mother   . Cancer  Mother        colon & breast cnacer  . Mental illness Mother        Depression & bipolar  . Cancer Father        lung & prostate cancer  . Alcohol abuse Brother   . Kidney disease Brother    Social History   Socioeconomic History  . Marital status: Married    Spouse name: Not on file  . Number of children: Not on file  . Years of education: Not on file  . Highest education level: Not on file  Occupational History  . Not on file  Tobacco Use  . Smoking status: Former Research scientist (life sciences)  . Smokeless tobacco: Current User    Types: Chew  Vaping Use  . Vaping Use: Never used  Substance and Sexual Activity  . Alcohol use: Yes    Alcohol/week: 0.0 standard drinks    Comment: socially  . Drug use: Never  . Sexual activity: Not Currently  Other Topics Concern  . Not on file  Social History Narrative  . Not on file   Social Determinants of Health   Financial Resource Strain:   . Difficulty of Paying Living Expenses:   Food Insecurity:   . Worried About Charity fundraiser in the Last Year:   . [place] in the Last Year:  Transportation Needs:   . Film/video editor (Medical):   Marland Kitchen Lack of Transportation (Non-Medical):   Physical Activity:   . Days of Exercise per Week:   . Minutes of Exercise per Session:   Stress:   . Feeling of Stress :   Social Connections:   . Frequency of Communication with Friends and Family:   . Frequency of Social Gatherings with Friends and Family:   . Attends Religious Services:   . Active Member of Clubs or Organizations:   . Attends Archivist Meetings:   Marland Kitchen Marital Status:     Outpatient Encounter Medications as of 12/17/2019  Medication Sig  . cholecalciferol (VITAMIN D3) 25 MCG (1000 UT) tablet Take 1,000 Units by mouth 2 (two) times a week.  . Multiple Vitamin (MULTIVITAMIN) tablet Take 1 tablet by mouth daily.  . Oxycodone HCl 20 MG TABS Take 20 mg by mouth 5 (five) times daily.   Marland Kitchen oxymetazoline (AFRIN) 0.05 % nasal  spray Place 1 spray into both nostrils at bedtime.  Marland Kitchen QUEtiapine (SEROQUEL) 300 MG tablet TAKE ONE TABLET AT BEDTIME  . rosuvastatin (CRESTOR) 10 MG tablet TAKE 1 TABLET BY MOUTH DAILY  . valACYclovir (VALTREX) 500 MG tablet Take 1 tablet (500 mg total) by mouth 2 (two) times daily. (Patient not taking: Reported on 03/15/2019)  . vitamin B-12 (CYANOCOBALAMIN) 500 MCG tablet Take 500 mcg by mouth daily.   No facility-administered encounter medications on file as of 12/17/2019.    Review of Systems  Constitutional: Negative for appetite change and unexpected weight change.  HENT: Negative for congestion and sinus pressure.   Respiratory: Negative for cough, chest tightness and shortness of breath.   Cardiovascular: Negative for chest pain, palpitations and leg swelling.  Gastrointestinal: Negative for abdominal pain, diarrhea, nausea and vomiting.  Genitourinary: Negative for difficulty urinating and dysuria.  Musculoskeletal: Negative for joint swelling and myalgias.  Skin: Negative for color change and rash.  Neurological: Negative for dizziness, light-headedness and headaches.       Concern regarding memory change.    Psychiatric/Behavioral: Negative for agitation.       Objective:    Physical Exam Vitals reviewed.  Constitutional:      General: He is not in acute distress.    Appearance: Normal appearance. He is well-developed.  HENT:     Head: Normocephalic and atraumatic.     Right Ear: External ear normal.     Left Ear: External ear normal.  Eyes:     General: No scleral icterus.       Right eye: No discharge.        Left eye: No discharge.     Conjunctiva/sclera: Conjunctivae normal.  Cardiovascular:     Rate and Rhythm: Normal rate and regular rhythm.  Pulmonary:     Effort: Pulmonary effort is normal. No respiratory distress.     Breath sounds: Normal breath sounds.  Abdominal:     General: Bowel sounds are normal.     Palpations: Abdomen is soft.      Tenderness: There is no abdominal tenderness.  Musculoskeletal:        General: No swelling or tenderness.     Cervical back: Neck supple. No tenderness.  Lymphadenopathy:     Cervical: No cervical adenopathy.  Skin:    Findings: No erythema or rash.  Neurological:     Mental Status: He is alert.  Psychiatric:        Mood and Affect: Mood normal.  Behavior: Behavior normal.     BP (!) 140/88   Pulse 85   Temp 98.2 F (36.8 C)   Resp 16   Ht 5' 10"  (1.778 m)   Wt 168 lb (76.2 kg)   SpO2 98%   BMI 24.11 kg/m  Wt Readings from Last 3 Encounters:  12/17/19 168 lb (76.2 kg)  11/02/19 167 lb (75.8 kg)  10/30/19 167 lb 9.6 oz (76 kg)     Lab Results  Component Value Date   WBC 5.8 06/20/2019   HGB 13.2 06/20/2019   HCT 39.6 06/20/2019   PLT 242.0 06/20/2019   GLUCOSE 105 (H) 11/13/2019   CHOL 163 11/13/2019   TRIG 138.0 11/13/2019   HDL 68.30 11/13/2019   LDLCALC 67 11/13/2019   ALT 15 11/13/2019   AST 21 11/13/2019   NA 141 11/13/2019   K 3.9 11/13/2019   CL 105 11/13/2019   CREATININE 0.82 11/13/2019   BUN 16 11/13/2019   CO2 32 11/13/2019   TSH 2.31 11/13/2019   PSA 7.00 (H) 06/20/2019   INR 1.03 05/29/2009   HGBA1C 5.6 11/13/2019       Assessment & Plan:   Problem List Items Addressed This Visit    Anemia    Follow cbc.       Aortic atherosclerosis (HCC)    Continue crestor.        Chronic neck pain (Primary Area of Pain) (Chronic)    Increased neck pain.  Due to f/u with Dr Deirdre Peer 12/31/19.        Elevated blood pressure reading    Blood pressure as outlined.  Spot check pressures.  Follow.  On no medication.        Hypercholesteremia    On crestor.  Low cholesterol diet and exercise.  Follow lipid panel and liver function tests.        Hyperglycemia    Low carb diet and exercise.  Follow met b and a1c.        Memory change    Recent mini mental status - 30/30.  Still concerned regarding memory issues.  Previous B12 level 320.   Discussed focus/concentrations issues.  Refer to neurology for further evaluation.        Relevant Orders   Ambulatory referral to Neurology   Sleep difficulties    On seroquel.  Stable.        Vitamin D insufficiency    Follow vitamin D level.            Einar Pheasant, MD

## 2019-12-30 ENCOUNTER — Encounter: Payer: Self-pay | Admitting: Internal Medicine

## 2019-12-30 DIAGNOSIS — R030 Elevated blood-pressure reading, without diagnosis of hypertension: Secondary | ICD-10-CM | POA: Insufficient documentation

## 2019-12-30 NOTE — Assessment & Plan Note (Signed)
Follow vitamin D level.  

## 2019-12-30 NOTE — Assessment & Plan Note (Signed)
On seroquel.  Stable.

## 2019-12-30 NOTE — Assessment & Plan Note (Signed)
On crestor.  Low cholesterol diet and exercise.  Follow lipid panel and liver function tests.   

## 2019-12-30 NOTE — Assessment & Plan Note (Signed)
Follow cbc.  

## 2019-12-30 NOTE — Assessment & Plan Note (Signed)
Continue crestor 

## 2019-12-30 NOTE — Assessment & Plan Note (Signed)
Blood pressure as outlined.  Spot check pressures.  Follow.  On no medication.

## 2019-12-30 NOTE — Assessment & Plan Note (Signed)
Increased neck pain.  Due to f/u with Dr Deirdre Peer 12/31/19.

## 2019-12-30 NOTE — Assessment & Plan Note (Signed)
Recent mini mental status - 30/30.  Still concerned regarding memory issues.  Previous B12 level 320.  Discussed focus/concentrations issues.  Refer to neurology for further evaluation.

## 2019-12-30 NOTE — Assessment & Plan Note (Signed)
Low carb diet and exercise.  Follow met b and a1c.   

## 2019-12-31 DIAGNOSIS — M47816 Spondylosis without myelopathy or radiculopathy, lumbar region: Secondary | ICD-10-CM | POA: Insufficient documentation

## 2019-12-31 DIAGNOSIS — M47812 Spondylosis without myelopathy or radiculopathy, cervical region: Secondary | ICD-10-CM | POA: Diagnosis not present

## 2019-12-31 DIAGNOSIS — M542 Cervicalgia: Secondary | ICD-10-CM | POA: Diagnosis not present

## 2019-12-31 DIAGNOSIS — M5412 Radiculopathy, cervical region: Secondary | ICD-10-CM | POA: Insufficient documentation

## 2020-01-01 ENCOUNTER — Other Ambulatory Visit: Payer: Self-pay

## 2020-01-01 ENCOUNTER — Telehealth: Payer: Self-pay | Admitting: Internal Medicine

## 2020-01-01 ENCOUNTER — Ambulatory Visit (INDEPENDENT_AMBULATORY_CARE_PROVIDER_SITE_OTHER): Payer: Medicare Other

## 2020-01-01 VITALS — BP 134/73 | HR 90

## 2020-01-01 DIAGNOSIS — R030 Elevated blood-pressure reading, without diagnosis of hypertension: Secondary | ICD-10-CM | POA: Diagnosis not present

## 2020-01-01 NOTE — Telephone Encounter (Signed)
Left message for patient to return call back.  

## 2020-01-01 NOTE — Progress Notes (Addendum)
Patient is here for a BP check due to bp being high at last visit, as per patient.  Currently patients BP is 134/73 and BPM is 90.  Patient has no complaints of headaches, blurry vision, chest pain, arm pain, light headedness, dizziness, and nor jaw pain. Please see previous note for order. Also BP readings for past ten days have been placed in PCP box.  Reviewed blood pressures (outside checks) - averaging 120s/70s.  Continue on no blood pressure medication.  Will follow.    Dr Nicki Reaper

## 2020-01-01 NOTE — Telephone Encounter (Signed)
Mr Eddleman left blood pressure readings - see Brock's note.  In reviewing, outside blood pressures averaging 120-130s/70-80s.  He is currently not on any blood pressure medication.  Blood pressures - ok.  Continue to monitor and let us know if any problems or if increase.

## 2020-01-01 NOTE — Telephone Encounter (Signed)
Patient has been informed.

## 2020-01-09 ENCOUNTER — Other Ambulatory Visit: Payer: Self-pay | Admitting: Neurosurgery

## 2020-01-09 DIAGNOSIS — M542 Cervicalgia: Secondary | ICD-10-CM

## 2020-01-16 ENCOUNTER — Ambulatory Visit
Admission: RE | Admit: 2020-01-16 | Discharge: 2020-01-16 | Disposition: A | Discharge status: Home / Self Care | Payer: Medicare Other | Source: Ambulatory Visit | Attending: Neurosurgery | Admitting: Neurosurgery

## 2020-01-16 ENCOUNTER — Other Ambulatory Visit: Payer: Self-pay

## 2020-01-16 DIAGNOSIS — M542 Cervicalgia: Secondary | ICD-10-CM | POA: Diagnosis not present

## 2020-01-31 DIAGNOSIS — Z87891 Personal history of nicotine dependence: Secondary | ICD-10-CM | POA: Diagnosis not present

## 2020-01-31 DIAGNOSIS — N4231 Prostatic intraepithelial neoplasia: Secondary | ICD-10-CM | POA: Diagnosis not present

## 2020-01-31 DIAGNOSIS — C61 Malignant neoplasm of prostate: Secondary | ICD-10-CM | POA: Diagnosis not present

## 2020-01-31 DIAGNOSIS — Z23 Encounter for immunization: Secondary | ICD-10-CM | POA: Diagnosis not present

## 2020-01-31 DIAGNOSIS — N4232 Atypical small acinar proliferation of prostate: Secondary | ICD-10-CM | POA: Diagnosis not present

## 2020-01-31 DIAGNOSIS — R972 Elevated prostate specific antigen [PSA]: Secondary | ICD-10-CM | POA: Diagnosis not present

## 2020-02-11 DIAGNOSIS — M5412 Radiculopathy, cervical region: Secondary | ICD-10-CM | POA: Diagnosis not present

## 2020-02-11 DIAGNOSIS — M4802 Spinal stenosis, cervical region: Secondary | ICD-10-CM | POA: Diagnosis not present

## 2020-02-11 DIAGNOSIS — M47812 Spondylosis without myelopathy or radiculopathy, cervical region: Secondary | ICD-10-CM | POA: Diagnosis not present

## 2020-02-11 DIAGNOSIS — Z79891 Long term (current) use of opiate analgesic: Secondary | ICD-10-CM | POA: Diagnosis not present

## 2020-02-11 DIAGNOSIS — R030 Elevated blood-pressure reading, without diagnosis of hypertension: Secondary | ICD-10-CM | POA: Diagnosis not present

## 2020-02-11 DIAGNOSIS — G894 Chronic pain syndrome: Secondary | ICD-10-CM | POA: Diagnosis not present

## 2020-02-11 DIAGNOSIS — M542 Cervicalgia: Secondary | ICD-10-CM | POA: Diagnosis not present

## 2020-02-12 DIAGNOSIS — Z8042 Family history of malignant neoplasm of prostate: Secondary | ICD-10-CM | POA: Diagnosis not present

## 2020-02-12 DIAGNOSIS — C61 Malignant neoplasm of prostate: Secondary | ICD-10-CM | POA: Diagnosis not present

## 2020-02-12 DIAGNOSIS — Z87891 Personal history of nicotine dependence: Secondary | ICD-10-CM | POA: Diagnosis not present

## 2020-02-21 ENCOUNTER — Other Ambulatory Visit: Payer: Self-pay | Admitting: Neurology

## 2020-02-21 ENCOUNTER — Other Ambulatory Visit (HOSPITAL_COMMUNITY): Payer: Self-pay | Admitting: Neurology

## 2020-02-21 DIAGNOSIS — E538 Deficiency of other specified B group vitamins: Secondary | ICD-10-CM | POA: Diagnosis not present

## 2020-02-21 DIAGNOSIS — G3184 Mild cognitive impairment, so stated: Secondary | ICD-10-CM

## 2020-02-21 DIAGNOSIS — C61 Malignant neoplasm of prostate: Secondary | ICD-10-CM | POA: Diagnosis not present

## 2020-02-21 DIAGNOSIS — E519 Thiamine deficiency, unspecified: Secondary | ICD-10-CM | POA: Diagnosis not present

## 2020-02-25 ENCOUNTER — Ambulatory Visit: Payer: Medicare Other | Admitting: Internal Medicine

## 2020-02-26 DIAGNOSIS — M47812 Spondylosis without myelopathy or radiculopathy, cervical region: Secondary | ICD-10-CM | POA: Diagnosis not present

## 2020-03-03 ENCOUNTER — Encounter: Payer: Medicare Other | Admitting: Internal Medicine

## 2020-03-12 ENCOUNTER — Ambulatory Visit (HOSPITAL_COMMUNITY): Payer: Medicare Other

## 2020-03-12 DIAGNOSIS — Z23 Encounter for immunization: Secondary | ICD-10-CM | POA: Diagnosis not present

## 2020-03-24 DIAGNOSIS — M47812 Spondylosis without myelopathy or radiculopathy, cervical region: Secondary | ICD-10-CM | POA: Diagnosis not present

## 2020-03-28 DIAGNOSIS — G3184 Mild cognitive impairment, so stated: Secondary | ICD-10-CM | POA: Diagnosis not present

## 2020-03-29 DIAGNOSIS — Z23 Encounter for immunization: Secondary | ICD-10-CM | POA: Diagnosis not present

## 2020-04-01 ENCOUNTER — Encounter (HOSPITAL_COMMUNITY): Payer: Self-pay

## 2020-04-01 ENCOUNTER — Ambulatory Visit (HOSPITAL_COMMUNITY): Payer: Medicare Other

## 2020-04-02 DIAGNOSIS — G894 Chronic pain syndrome: Secondary | ICD-10-CM | POA: Diagnosis not present

## 2020-04-02 DIAGNOSIS — M47812 Spondylosis without myelopathy or radiculopathy, cervical region: Secondary | ICD-10-CM | POA: Diagnosis not present

## 2020-04-02 DIAGNOSIS — Z79891 Long term (current) use of opiate analgesic: Secondary | ICD-10-CM | POA: Diagnosis not present

## 2020-04-02 DIAGNOSIS — M4802 Spinal stenosis, cervical region: Secondary | ICD-10-CM | POA: Diagnosis not present

## 2020-04-10 DIAGNOSIS — M21622 Bunionette of left foot: Secondary | ICD-10-CM | POA: Diagnosis not present

## 2020-04-15 ENCOUNTER — Other Ambulatory Visit: Payer: Self-pay | Admitting: Internal Medicine

## 2020-04-16 ENCOUNTER — Other Ambulatory Visit: Payer: Self-pay

## 2020-04-16 ENCOUNTER — Encounter (HOSPITAL_COMMUNITY): Payer: Self-pay | Admitting: Neurology

## 2020-04-16 NOTE — Progress Notes (Signed)
PCP - Dr Einar Pheasant Cardiologist - n/a Neuro - Dr Jennings Books Oncology - Dr Rutherford Limerick at [facility]  Chest x-ray - n/a EKG - n/a Stress Test - 07/28/15 ECHO - n/a Cardiac Cath - n/a  STOP now taking any Aspirin (unless otherwise instructed by your surgeon), Aleve, Naproxen, Ibuprofen, Motrin, Advil, Goody's, BC's, all herbal medications, fish oil, and all vitamins.   Coronavirus Screening Covid test is scheduled on 04/18/20 Do you have any of the following symptoms:  Cough yes/no: No Fever (>100.43F)  yes/no: No Runny nose yes/no: No Sore throat yes/no: No Difficulty breathing/shortness of breath  yes/no: No  Have you traveled in the last 14 days and where? yes/no: No  Patient verbalized understanding of instructions that were given via phone.

## 2020-04-18 ENCOUNTER — Other Ambulatory Visit
Admission: RE | Admit: 2020-04-18 | Discharge: 2020-04-18 | Disposition: A | Discharge status: Home / Self Care | Payer: Medicare Other | Source: Ambulatory Visit | Attending: Neurology | Admitting: Neurology

## 2020-04-18 ENCOUNTER — Other Ambulatory Visit: Payer: Self-pay

## 2020-04-18 DIAGNOSIS — Z01812 Encounter for preprocedural laboratory examination: Secondary | ICD-10-CM | POA: Insufficient documentation

## 2020-04-18 DIAGNOSIS — Z20822 Contact with and (suspected) exposure to covid-19: Secondary | ICD-10-CM | POA: Diagnosis not present

## 2020-04-19 LAB — SARS CORONAVIRUS 2 (TAT 6-24 HRS): SARS Coronavirus 2: NEGATIVE

## 2020-04-22 ENCOUNTER — Other Ambulatory Visit: Payer: Self-pay

## 2020-04-22 ENCOUNTER — Ambulatory Visit (HOSPITAL_COMMUNITY): Payer: Medicare Other | Admitting: Certified Registered"

## 2020-04-22 ENCOUNTER — Encounter (HOSPITAL_COMMUNITY): Payer: Self-pay | Admitting: Neurology

## 2020-04-22 ENCOUNTER — Ambulatory Visit (HOSPITAL_COMMUNITY)
Admission: RE | Admit: 2020-04-22 | Discharge: 2020-04-22 | Disposition: A | Discharge status: Home / Self Care | Payer: Medicare Other | Source: Ambulatory Visit | Attending: Neurology | Admitting: Neurology

## 2020-04-22 ENCOUNTER — Encounter (HOSPITAL_COMMUNITY)
Admission: RE | Disposition: A | Discharge status: Home / Self Care | Payer: Self-pay | Source: Home / Self Care | Attending: Neurology

## 2020-04-22 ENCOUNTER — Ambulatory Visit (HOSPITAL_COMMUNITY)
Admission: RE | Admit: 2020-04-22 | Discharge: 2020-04-22 | Disposition: A | Discharge status: Home / Self Care | Payer: Medicare Other | Attending: Neurology | Admitting: Neurology

## 2020-04-22 DIAGNOSIS — D630 Anemia in neoplastic disease: Secondary | ICD-10-CM | POA: Diagnosis not present

## 2020-04-22 DIAGNOSIS — Z87891 Personal history of nicotine dependence: Secondary | ICD-10-CM | POA: Insufficient documentation

## 2020-04-22 DIAGNOSIS — Z880 Allergy status to penicillin: Secondary | ICD-10-CM | POA: Insufficient documentation

## 2020-04-22 DIAGNOSIS — H748X2 Other specified disorders of left middle ear and mastoid: Secondary | ICD-10-CM | POA: Diagnosis not present

## 2020-04-22 DIAGNOSIS — G3184 Mild cognitive impairment, so stated: Secondary | ICD-10-CM | POA: Insufficient documentation

## 2020-04-22 DIAGNOSIS — R419 Unspecified symptoms and signs involving cognitive functions and awareness: Secondary | ICD-10-CM | POA: Diagnosis not present

## 2020-04-22 DIAGNOSIS — E7800 Pure hypercholesterolemia, unspecified: Secondary | ICD-10-CM | POA: Diagnosis not present

## 2020-04-22 DIAGNOSIS — J0100 Acute maxillary sinusitis, unspecified: Secondary | ICD-10-CM | POA: Diagnosis not present

## 2020-04-22 DIAGNOSIS — Z885 Allergy status to narcotic agent status: Secondary | ICD-10-CM | POA: Insufficient documentation

## 2020-04-22 DIAGNOSIS — Z87820 Personal history of traumatic brain injury: Secondary | ICD-10-CM | POA: Insufficient documentation

## 2020-04-22 DIAGNOSIS — E559 Vitamin D deficiency, unspecified: Secondary | ICD-10-CM | POA: Diagnosis not present

## 2020-04-22 DIAGNOSIS — I6782 Cerebral ischemia: Secondary | ICD-10-CM | POA: Diagnosis not present

## 2020-04-22 HISTORY — DX: Mild cognitive impairment of uncertain or unknown etiology: G31.84

## 2020-04-22 HISTORY — DX: Anxiety disorder, unspecified: F41.9

## 2020-04-22 HISTORY — PX: RADIOLOGY WITH ANESTHESIA: SHX6223

## 2020-04-22 HISTORY — DX: Unspecified osteoarthritis, unspecified site: M19.90

## 2020-04-22 HISTORY — DX: Personal history of other infectious and parasitic diseases: Z86.19

## 2020-04-22 HISTORY — DX: Hyperlipidemia, unspecified: E78.5

## 2020-04-22 HISTORY — DX: Malignant (primary) neoplasm, unspecified: C80.1

## 2020-04-22 SURGERY — MRI WITH ANESTHESIA
Anesthesia: General

## 2020-04-22 MED ORDER — LACTATED RINGERS IV SOLN: INTRAVENOUS | Status: DC

## 2020-04-22 MED ORDER — ORAL CARE MOUTH RINSE: 15.0000 mL | Freq: Once | OROMUCOSAL | Status: AC

## 2020-04-22 MED ORDER — CHLORHEXIDINE GLUCONATE 0.12 % MT SOLN: 15.0000 mL | Freq: Once | OROMUCOSAL | Status: AC

## 2020-04-22 MED ORDER — LIDOCAINE HCL (CARDIAC) PF 100 MG/5ML IV SOSY
PREFILLED_SYRINGE | INTRAVENOUS | Status: DC | PRN
  Administered 2020-04-22: 30 mg via INTRAVENOUS

## 2020-04-22 MED ORDER — PROPOFOL 10 MG/ML IV BOLUS
INTRAVENOUS | Status: DC | PRN
  Administered 2020-04-22: 200 mg via INTRAVENOUS

## 2020-04-22 MED ORDER — CHLORHEXIDINE GLUCONATE 0.12 % MT SOLN
OROMUCOSAL | Status: AC
  Administered 2020-04-22: 15 mL via OROMUCOSAL
  Filled 2020-04-22: qty 15

## 2020-04-22 MED ORDER — ONDANSETRON HCL 4 MG/2ML IJ SOLN: 4.0000 mg | Freq: Once | INTRAMUSCULAR | Status: DC | PRN

## 2020-04-22 NOTE — Anesthesia Postprocedure Evaluation (Signed)
Anesthesia Post Note  Patient: [name]  Procedure(s) Performed: MRI WITH ANESTHESIA BRAIN WITHOUT CONTRAST (N/A )     Patient location during evaluation: PACU Anesthesia Type: General Level of consciousness: awake and alert Pain management: pain level controlled Vital Signs Assessment: post-procedure vital signs reviewed and stable Respiratory status: spontaneous breathing, nonlabored ventilation and respiratory function stable Cardiovascular status: blood pressure returned to baseline and stable Postop Assessment: no apparent nausea or vomiting Anesthetic complications: no   No complications documented.  Last Vitals:  Vitals:   04/22/20 1202 04/22/20 1210  BP: (!) 141/78 122/89  Pulse: 62 61  Resp: 15 16  Temp:    SpO2: 99% 99%                  [name]

## 2020-04-22 NOTE — Anesthesia Preprocedure Evaluation (Addendum)
Anesthesia Evaluation  Patient identified by MRN, date of birth, ID band Patient awake    Reviewed: Allergy & Precautions, NPO status , Patient's Chart, lab work & pertinent test results  History of Anesthesia Complications Negative for: history of anesthetic complications  Airway Mallampati: II  TM Distance: >3 FB Neck ROM: Limited    Dental  (+) Dental Advisory Given, Teeth Intact   Pulmonary former smoker,    Pulmonary exam normal        Cardiovascular negative cardio ROS Normal cardiovascular exam     Neuro/Psych  Headaches, PSYCHIATRIC DISORDERS Anxiety Depression  Cognitive impairment     GI/Hepatic negative GI ROS, Neg liver ROS,   Endo/Other  negative endocrine ROS  Renal/GU negative Renal ROS    Prostate cancer     Musculoskeletal  (+) Arthritis , Osteoarthritis,    Abdominal   Peds  Hematology negative hematology ROS (+)   Anesthesia Other Findings Covid test negative   Reproductive/Obstetrics                            Anesthesia Physical Anesthesia Plan  ASA: II  Anesthesia Plan: General   Post-op Pain Management:    Induction: Intravenous  PONV Risk Score and Plan: 2 and Treatment may vary due to age or medical condition, Ondansetron and Dexamethasone  Airway Management Planned: LMA  Additional Equipment: None  Intra-op Plan:   Post-operative Plan: Extubation in OR  Informed Consent: I have reviewed the patients History and Physical, chart, labs and discussed the procedure including the risks, benefits and alternatives for the proposed anesthesia with the patient or authorized representative who has indicated his/her understanding and acceptance.     Dental advisory given  Plan Discussed with: CRNA and Anesthesiologist  Anesthesia Plan Comments:        Anesthesia Quick Evaluation

## 2020-04-22 NOTE — Anesthesia Procedure Notes (Signed)
Procedure Name: LMA Insertion Date/Time: 04/15/2020 10:48 AM Performed by: Eligha Bridegroom, CRNA Pre-anesthesia Checklist: Patient identified, Emergency Drugs available, Suction available, Patient being monitored and Timeout performed Patient Re-evaluated:Patient Re-evaluated prior to induction Oxygen Delivery Method: Circle system utilized Preoxygenation: Pre-oxygenation with 100% oxygen Induction Type: IV induction LMA: LMA inserted LMA Size: 4.0 Placement Confirmation: positive ETCO2 and breath sounds checked- equal and bilateral Tube secured with: Tape Dental Injury: Teeth and Oropharynx as per pre-operative assessment

## 2020-04-22 NOTE — Transfer of Care (Signed)
Immediate Anesthesia Transfer of Care Note  Patient: Mike Farley  Procedure(s) Performed: MRI WITH ANESTHESIA BRAIN WITHOUT CONTRAST (N/A )  Patient Location: PACU  Anesthesia Type:General  Level of Consciousness: awake, alert  and oriented  Airway & Oxygen Therapy: Patient Spontanous Breathing  Post-op Assessment: Report given to RN and Post -op Vital signs reviewed and stable  Post vital signs: Reviewed and stable  Last Vitals:  Vitals Value Taken Time  BP 140/84 04/22/20 1147  Temp 36.3 C 04/22/20 1147  Pulse 60 04/22/20 1152  Resp 15 04/22/20 1152  SpO2 99 % 04/22/20 1152  Vitals shown include unvalidated device data.  Last Pain:  Vitals:   04/22/20 1147  TempSrc:   PainSc: 7       Patients Stated Pain Goal: 2 (85/50/15 8682)  Complications: No complications documented.

## 2020-04-23 ENCOUNTER — Encounter (HOSPITAL_COMMUNITY): Payer: Self-pay | Admitting: Radiology

## 2020-05-28 DIAGNOSIS — G894 Chronic pain syndrome: Secondary | ICD-10-CM | POA: Diagnosis not present

## 2020-05-28 DIAGNOSIS — M47812 Spondylosis without myelopathy or radiculopathy, cervical region: Secondary | ICD-10-CM | POA: Diagnosis not present

## 2020-05-28 DIAGNOSIS — M4802 Spinal stenosis, cervical region: Secondary | ICD-10-CM | POA: Diagnosis not present

## 2020-05-28 DIAGNOSIS — Z79891 Long term (current) use of opiate analgesic: Secondary | ICD-10-CM | POA: Diagnosis not present

## 2020-05-29 ENCOUNTER — Other Ambulatory Visit: Payer: Self-pay

## 2020-05-29 ENCOUNTER — Ambulatory Visit (INDEPENDENT_AMBULATORY_CARE_PROVIDER_SITE_OTHER): Payer: Medicare Other | Admitting: Internal Medicine

## 2020-05-29 DIAGNOSIS — E559 Vitamin D deficiency, unspecified: Secondary | ICD-10-CM

## 2020-05-29 DIAGNOSIS — R030 Elevated blood-pressure reading, without diagnosis of hypertension: Secondary | ICD-10-CM

## 2020-05-29 DIAGNOSIS — Z0000 Encounter for general adult medical examination without abnormal findings: Secondary | ICD-10-CM

## 2020-05-29 DIAGNOSIS — R413 Other amnesia: Secondary | ICD-10-CM | POA: Diagnosis not present

## 2020-05-29 DIAGNOSIS — I700 Atherosclerosis of aorta: Secondary | ICD-10-CM

## 2020-05-29 DIAGNOSIS — E7800 Pure hypercholesterolemia, unspecified: Secondary | ICD-10-CM

## 2020-05-29 DIAGNOSIS — C61 Malignant neoplasm of prostate: Secondary | ICD-10-CM | POA: Diagnosis not present

## 2020-05-29 DIAGNOSIS — D649 Anemia, unspecified: Secondary | ICD-10-CM

## 2020-05-29 DIAGNOSIS — R739 Hyperglycemia, unspecified: Secondary | ICD-10-CM

## 2020-05-29 NOTE — Progress Notes (Signed)
Patient ID: Mike Farley, male   DOB: November 27, 1950, 70 y.o.   MRN: [id number]   Subjective:    Patient ID: Mike Farley, male    DOB: 03/24/1951, 70 y.o.   MRN: [id number]  HPI This visit occurred during the SARS-CoV-2 public health emergency.  Safety protocols were in place, including screening questions prior to the visit, additional usage of staff PPE, and extensive cleaning of exam room while observing appropriate contact time as indicated for disinfecting solutions.  Patient with past history of hypercholesterolemia and prostate cancer.  He comes in today to follow up on these issues as well as for a complete physical exam.  He reports he is doing relatively well.  Tries to stay active.  No chest pain or sob reported.  No abdominal pain or bowel change reported.  Daughter is planning a wedding in February.  Dislocated his toe.  Wants to hold on any further intervention until after wedding.  Still with neck issues.  Has seen Dr Manuella Ghazi for evaluation of mild cognitive impairment.  MRI reviewed.  Request to have vitamin D level rechecked.  Discussed trying to taper afrin.     Past Medical History:  Diagnosis Date  . Anxiety   . Arthritis    shoulder  . Cancer [facility])    prostate ca - Md just watching  . Cervical pain (neck)    limited mobility  . Chronic pain   . Depression    years ago (minor)  . HLD (hyperlipidemia)   . Hx of cold sores   . Mild cognitive impairment with memory loss    Past Surgical History:  Procedure Laterality Date  . ANTERIOR FUSION CERVICAL SPINE  2011   4-5 levels  . APPENDECTOMY    . BONE EXCISION Left 09/28/2017   Procedure: BONE EXCISION-TAILORS  EXOSTECTOMY;  Surgeon: Samara Deist, DPM;  Location: [place];  Service: Podiatry;  Laterality: Left;  IVA LOCAL  . CERVICAL DISCECTOMY  2008   C2-C7 Fused   . CHOLECYSTECTOMY    . COLONOSCOPY  2020  . ELBOW FRACTURE SURGERY Left 2004  . PROSTATE BIOPSY N/A 03/15/2019   Procedure: PROSTATE  BIOPSY Uro Nav;  Surgeon: Royston Cowper, MD;  Location: [facility];  Service: Urology;  Laterality: N/A;  . RADIOLOGY WITH ANESTHESIA N/A 04/22/2020   Procedure: MRI WITH ANESTHESIA BRAIN WITHOUT CONTRAST;  Surgeon: Radiologist, Medication, MD;  Location: [place];  Service: Radiology;  Laterality: N/A;  . SHOULDER SURGERY Right   . TONSILLECTOMY AND ADENOIDECTOMY    . TOTAL KNEE ARTHROPLASTY Right 2005   x 4   Family History  Problem Relation Age of Onset  . Arthritis Mother   . Cancer Mother        colon & breast cnacer  . Mental illness Mother        Depression & bipolar  . Cancer Father        lung & prostate cancer  . Alcohol abuse Brother   . Kidney disease Brother    Social History   Socioeconomic History  . Marital status: Married    Spouse name: Not on file  . Number of children: Not on file  . Years of education: Not on file  . Highest education level: Not on file  Occupational History  . Not on file  Tobacco Use  . Smoking status: Former Research scientist (life sciences)  . Smokeless tobacco: Current User    Types: Chew  . Tobacco comment: Quit yrs ago  Vaping Use  . Vaping Use: Never used  Substance and Sexual Activity  . Alcohol use: Yes    Alcohol/week: 7.0 standard drinks    Types: 7 Standard drinks or equivalent per week    Comment: beer/wine  . Drug use: Never  . Sexual activity: Not Currently  Other Topics Concern  . Not on file  Social History Narrative  . Not on file   Social Determinants of Health   Financial Resource Strain: Not on file  Food Insecurity: Not on file  Transportation Needs: Not on file  Physical Activity: Not on file  Stress: Not on file  Social Connections: Not on file    Outpatient Encounter Medications as of 05/29/2020  Medication Sig  . cholecalciferol (VITAMIN D3) 25 MCG (1000 UT) tablet Take 1,000 Units by mouth 3 (three) times a week.   . cyanocobalamin 1000 MCG tablet Take 1,000 mcg by mouth daily.   . Melatonin 10 MG TABS Take 30 mg by  mouth at bedtime.  . Multiple Vitamin (MULTIVITAMIN) tablet Take 1 tablet by mouth daily. ABC Complete MEN  . oxyCODONE (ROXICODONE) 15 MG immediate release tablet Take 15 mg by mouth in the morning, at noon, in the evening, and at bedtime.  Marland Kitchen oxymetazoline (AFRIN) 0.05 % nasal spray Place 1 spray into both nostrils at bedtime.  Marland Kitchen QUEtiapine (SEROQUEL) 300 MG tablet TAKE ONE TABLET AT BEDTIME  . rosuvastatin (CRESTOR) 10 MG tablet TAKE 1 TABLET BY MOUTH DAILY (Patient taking differently: Take 10 mg by mouth daily.)  . valACYclovir (VALTREX) 500 MG tablet Take 1 tablet (500 mg total) by mouth 2 (two) times daily. (Patient taking differently: Take 500 mg by mouth daily as needed (Cold sores).)   No facility-administered encounter medications on file as of 05/29/2020.    Review of Systems  Constitutional: Negative for appetite change and unexpected weight change.  HENT: Negative for congestion, sinus pressure and sore throat.   Eyes: Negative for pain and visual disturbance.  Respiratory: Negative for cough, chest tightness and shortness of breath.   Cardiovascular: Negative for chest pain, palpitations and leg swelling.  Gastrointestinal: Negative for abdominal pain, diarrhea, nausea and vomiting.  Genitourinary: Negative for difficulty urinating and dysuria.  Musculoskeletal: Negative for myalgias.       Neck issues as outlined.    Skin: Negative for color change and rash.  Neurological: Negative for dizziness, light-headedness and headaches.  Hematological: Negative for adenopathy. Does not bruise/bleed easily.  Psychiatric/Behavioral: Negative for agitation and dysphoric mood.       Objective:    Physical Exam Vitals reviewed.  Constitutional:      General: He is not in acute distress.    Appearance: Normal appearance. He is well-developed and well-nourished.  HENT:     Head: Normocephalic and atraumatic.     Right Ear: External ear normal.     Left Ear: External ear normal.      Mouth/Throat:     Mouth: Oropharynx is clear and moist.  Eyes:     General: No scleral icterus.       Right eye: No discharge.        Left eye: No discharge.     Conjunctiva/sclera: Conjunctivae normal.  Neck:     Thyroid: No thyromegaly.  Cardiovascular:     Rate and Rhythm: Normal rate and regular rhythm.  Pulmonary:     Effort: No respiratory distress.     Breath sounds: Normal breath sounds. No wheezing.  Abdominal:  General: Bowel sounds are normal.     Palpations: Abdomen is soft.     Tenderness: There is no abdominal tenderness.  Musculoskeletal:        General: No swelling, tenderness or edema.     Cervical back: Neck supple. No tenderness.  Lymphadenopathy:     Cervical: No cervical adenopathy.  Skin:    Findings: No erythema or rash.  Neurological:     Mental Status: He is alert and oriented to person, place, and time.  Psychiatric:        Mood and Affect: Mood and affect and mood normal.        Behavior: Behavior normal.     BP 118/70   Pulse 84   Temp 97.7 F (36.5 C) (Oral)   Resp 16   Ht _0  (1.778 m)   Wt 167 lb 3.2 oz (75.8 kg)   SpO2 99%   BMI 23.99 kg/m  Wt Readings from Last 3 Encounters:  05/29/20 167 lb 3.2 oz (75.8 kg)  04/22/20 157 lb (71.2 kg)  12/17/19 168 lb (76.2 kg)     Lab Results  Component Value Date   WBC 6.0 05/30/2020   HGB 14.0 05/30/2020   HCT 41.3 05/30/2020   PLT 267.0 05/30/2020   GLUCOSE 105 (H) 11/13/2019   CHOL 170 05/30/2020   TRIG 75.0 05/30/2020   HDL 80.30 05/30/2020   LDLCALC 74 05/30/2020   ALT 14 05/30/2020   AST 19 05/30/2020   NA 141 11/13/2019   K 3.9 11/13/2019   CL 105 11/13/2019   CREATININE 0.82 11/13/2019   BUN 16 11/13/2019   CO2 32 11/13/2019   TSH 2.31 11/13/2019   PSA 7.00 (H) 06/20/2019   INR 1.03 05/29/2009   HGBA1C 5.7 05/30/2020    MR BRAIN WO CONTRAST  Result Date: 04/22/2020 CLINICAL DATA:  Mild cognitive impairment, NeuroQuant protocol EXAM: MRI HEAD WITHOUT CONTRAST  TECHNIQUE: Multiplanar, multiecho pulse sequences of the brain and surrounding structures were obtained without intravenous contrast. Additionally, using NeuroQuant software a 3D volumetric analysis of the brain was performed and is compared to a normative database adjusted for age, gender and intracranial volume. COMPARISON:  None. FINDINGS: Brain: No diffusion-weighted signal abnormality. No intracranial hemorrhage. No midline shift, ventriculomegaly or extra-axial fluid collection. No mass lesion. Minimal chronic microvascular ischemic changes. Remote right corona radiata lacunar insult. Vascular: Proximally preserved major intracranial flow voids. Skull and upper cervical spine: Upper cervical spondylosis. Sinuses/Orbits: Normal orbits. Mild ethmoid and right maxillary sinus mucosal thickening. Trace left mastoid effusion. Other: None. NeuroQuant Findings: Volumetric analysis of the brain was performed, with a fully detailed report in [facility]. Briefly, the comparison with age and gender matched reference reveals whole brain, lateral ventricular and thalamic volumes are within normal limits for patient's age. IMPRESSION: 1. No acute intracranial process. Remote right corona radiata lacunar insult. 2. Minimal chronic microvascular ischemic changes. 3. NeuroQuant volumetric analysis of the brain, see details on [employer]. Electronically Signed   By: Primitivo Gauze M.D.   On: 04/22/2020 12:16       Assessment & Plan:   Problem List Items Addressed This Visit    Health care maintenance    Physical today 05/29/20.  Colonoscopy 05/2017.  PSA - being followed by oncology.       Hyperglycemia    Low carb diet and exercise.  Follow met b and a1c.        Relevant Orders   Hemoglobin A1c   Basic  metabolic panel   Vitamin D insufficiency    Recheck vitamin d level with next labs        Relevant Orders   VITAMIN D 25 Hydroxy (Vit-D Deficiency, Fractures)   Memory change    Mild cognitive  impairment.  Being followed by neurology.  Seeing Dr Manuella Ghazi tomorrow.        Aortic atherosclerosis (HCC)    Continue crestor.       Prostate cancer [facility])    Recently diagnosed with prostate cancer.  Seeing oncology.  Elected active surveillance.        Hypercholesteremia    On crestor.  Low cholesterol diet and exercise.  Follow lipid panel and liver function tests.        Relevant Orders   Lipid panel   Hepatic function panel   Basic metabolic panel   Anemia    Follow cbc.       Elevated blood pressure reading    Previously noted elevated blood pressure on one reading recently.  Check today wnl.  Follow.           Einar Pheasant, MD

## 2020-05-29 NOTE — Assessment & Plan Note (Addendum)
Physical today 05/29/20.  Colonoscopy 05/2017.  PSA - being followed by oncology.

## 2020-05-30 ENCOUNTER — Other Ambulatory Visit: Payer: Self-pay

## 2020-05-30 ENCOUNTER — Telehealth: Payer: Self-pay

## 2020-05-30 ENCOUNTER — Other Ambulatory Visit (INDEPENDENT_AMBULATORY_CARE_PROVIDER_SITE_OTHER): Payer: Medicare Other

## 2020-05-30 DIAGNOSIS — G3184 Mild cognitive impairment, so stated: Secondary | ICD-10-CM | POA: Diagnosis not present

## 2020-05-30 DIAGNOSIS — C61 Malignant neoplasm of prostate: Secondary | ICD-10-CM | POA: Diagnosis not present

## 2020-05-30 DIAGNOSIS — E7800 Pure hypercholesterolemia, unspecified: Secondary | ICD-10-CM

## 2020-05-30 DIAGNOSIS — R739 Hyperglycemia, unspecified: Secondary | ICD-10-CM

## 2020-05-30 DIAGNOSIS — D649 Anemia, unspecified: Secondary | ICD-10-CM

## 2020-05-30 LAB — CBC WITH DIFFERENTIAL/PLATELET
Basophils Absolute: 0 10*3/uL (ref 0.0–0.1)
Basophils Relative: 0.5 % (ref 0.0–3.0)
Eosinophils Absolute: 0.1 10*3/uL (ref 0.0–0.7)
Eosinophils Relative: 1.7 % (ref 0.0–5.0)
HCT: 41.3 % (ref 39.0–52.0)
Hemoglobin: 14 g/dL (ref 13.0–17.0)
Lymphocytes Relative: 34.9 % (ref 12.0–46.0)
Lymphs Abs: 2.1 10*3/uL (ref 0.7–4.0)
MCHC: 34 g/dL (ref 30.0–36.0)
MCV: 91.3 fl (ref 78.0–100.0)
Monocytes Absolute: 0.5 10*3/uL (ref 0.1–1.0)
Monocytes Relative: 7.7 % (ref 3.0–12.0)
Neutro Abs: 3.3 10*3/uL (ref 1.4–7.7)
Neutrophils Relative %: 55.2 % (ref 43.0–77.0)
Platelets: 267 10*3/uL (ref 150.0–400.0)
RBC: 4.52 Mil/uL (ref 4.22–5.81)
RDW: 13.8 % (ref 11.5–15.5)
WBC: 6 10*3/uL (ref 4.0–10.5)

## 2020-05-30 LAB — LIPID PANEL
Cholesterol: 170 mg/dL (ref 0–200)
HDL: 80.3 mg/dL (ref >39.00)
LDL Cholesterol: 74 mg/dL (ref 0–99)
NonHDL: 89.32
Total CHOL/HDL Ratio: 2
Triglycerides: 75 mg/dL (ref 0.0–149.0)
VLDL: 15 mg/dL (ref 0.0–40.0)

## 2020-05-30 LAB — HEPATIC FUNCTION PANEL
ALT: 14 U/L (ref 0–53)
AST: 19 U/L (ref 0–37)
Albumin: 4.7 g/dL (ref 3.5–5.2)
Alkaline Phosphatase: 97 U/L (ref 39–117)
Bilirubin, Direct: 0.1 mg/dL (ref 0.0–0.3)
Total Bilirubin: 0.5 mg/dL (ref 0.2–1.2)
Total Protein: 7 g/dL (ref 6.0–8.3)

## 2020-05-30 LAB — HEMOGLOBIN A1C: Hgb A1c MFr Bld: 5.7 % (ref 4.6–6.5)

## 2020-05-30 NOTE — Telephone Encounter (Signed)
Orders placed for labs

## 2020-05-31 ENCOUNTER — Encounter: Payer: Self-pay | Admitting: Internal Medicine

## 2020-05-31 NOTE — Assessment & Plan Note (Signed)
Follow cbc.  

## 2020-05-31 NOTE — Assessment & Plan Note (Signed)
Mild cognitive impairment.  Being followed by neurology.  Seeing Dr Manuella Ghazi tomorrow.

## 2020-05-31 NOTE — Assessment & Plan Note (Signed)
Recheck vitamin d level with next labs

## 2020-05-31 NOTE — Assessment & Plan Note (Signed)
On crestor.  Low cholesterol diet and exercise.  Follow lipid panel and liver function tests.   

## 2020-05-31 NOTE — Assessment & Plan Note (Signed)
Continue crestor 

## 2020-05-31 NOTE — Assessment & Plan Note (Signed)
Recently diagnosed with prostate cancer.  Seeing oncology.  Elected active surveillance.

## 2020-05-31 NOTE — Assessment & Plan Note (Signed)
Low carb diet and exercise.  Follow met b and a1c.   

## 2020-05-31 NOTE — Assessment & Plan Note (Signed)
Previously noted elevated blood pressure on one reading recently.  Check today wnl.  Follow.

## 2020-06-04 DIAGNOSIS — M5451 Vertebrogenic low back pain: Secondary | ICD-10-CM | POA: Diagnosis not present

## 2020-06-04 DIAGNOSIS — M5136 Other intervertebral disc degeneration, lumbar region: Secondary | ICD-10-CM | POA: Diagnosis not present

## 2020-06-04 DIAGNOSIS — M9904 Segmental and somatic dysfunction of sacral region: Secondary | ICD-10-CM | POA: Diagnosis not present

## 2020-06-04 DIAGNOSIS — M9903 Segmental and somatic dysfunction of lumbar region: Secondary | ICD-10-CM | POA: Diagnosis not present

## 2020-06-04 DIAGNOSIS — M7918 Myalgia, other site: Secondary | ICD-10-CM | POA: Diagnosis not present

## 2020-06-04 DIAGNOSIS — M5137 Other intervertebral disc degeneration, lumbosacral region: Secondary | ICD-10-CM | POA: Diagnosis not present

## 2020-06-05 ENCOUNTER — Other Ambulatory Visit (INDEPENDENT_AMBULATORY_CARE_PROVIDER_SITE_OTHER): Payer: Medicare Other

## 2020-06-05 ENCOUNTER — Other Ambulatory Visit: Payer: Self-pay

## 2020-06-05 ENCOUNTER — Other Ambulatory Visit: Payer: Medicare Other

## 2020-06-05 DIAGNOSIS — E559 Vitamin D deficiency, unspecified: Secondary | ICD-10-CM

## 2020-06-05 DIAGNOSIS — M7918 Myalgia, other site: Secondary | ICD-10-CM | POA: Diagnosis not present

## 2020-06-05 DIAGNOSIS — M5136 Other intervertebral disc degeneration, lumbar region: Secondary | ICD-10-CM | POA: Diagnosis not present

## 2020-06-05 DIAGNOSIS — E7800 Pure hypercholesterolemia, unspecified: Secondary | ICD-10-CM

## 2020-06-05 DIAGNOSIS — M9904 Segmental and somatic dysfunction of sacral region: Secondary | ICD-10-CM | POA: Diagnosis not present

## 2020-06-05 DIAGNOSIS — M5137 Other intervertebral disc degeneration, lumbosacral region: Secondary | ICD-10-CM | POA: Diagnosis not present

## 2020-06-05 DIAGNOSIS — R739 Hyperglycemia, unspecified: Secondary | ICD-10-CM

## 2020-06-05 DIAGNOSIS — M5451 Vertebrogenic low back pain: Secondary | ICD-10-CM | POA: Diagnosis not present

## 2020-06-05 DIAGNOSIS — M9903 Segmental and somatic dysfunction of lumbar region: Secondary | ICD-10-CM | POA: Diagnosis not present

## 2020-06-05 LAB — BASIC METABOLIC PANEL
BUN: 24 mg/dL — ABNORMAL HIGH (ref 6–23)
CO2: 30 mEq/L (ref 19–32)
Calcium: 9.3 mg/dL (ref 8.4–10.5)
Chloride: 100 mEq/L (ref 96–112)
Creatinine, Ser: 0.89 mg/dL (ref 0.40–1.50)
GFR: 87.13 mL/min (ref >60.00)
Glucose, Bld: 105 mg/dL — ABNORMAL HIGH (ref 70–99)
Potassium: 4.3 mEq/L (ref 3.5–5.1)
Sodium: 135 mEq/L (ref 135–145)

## 2020-06-05 LAB — VITAMIN D 25 HYDROXY (VIT D DEFICIENCY, FRACTURES): VITD: 40.72 ng/mL (ref 30.00–100.00)

## 2020-06-06 ENCOUNTER — Encounter: Payer: Self-pay | Admitting: Internal Medicine

## 2020-06-06 ENCOUNTER — Other Ambulatory Visit: Payer: Medicare Other

## 2020-06-06 ENCOUNTER — Other Ambulatory Visit: Payer: Self-pay | Admitting: Internal Medicine

## 2020-06-06 DIAGNOSIS — E7800 Pure hypercholesterolemia, unspecified: Secondary | ICD-10-CM

## 2020-06-06 NOTE — Progress Notes (Signed)
Order placed for f/u lab.   

## 2020-06-24 ENCOUNTER — Other Ambulatory Visit: Payer: Self-pay | Admitting: Internal Medicine

## 2020-06-26 ENCOUNTER — Other Ambulatory Visit: Payer: Self-pay | Admitting: Podiatry

## 2020-06-26 DIAGNOSIS — M21622 Bunionette of left foot: Secondary | ICD-10-CM | POA: Diagnosis not present

## 2020-06-30 ENCOUNTER — Other Ambulatory Visit: Payer: Self-pay

## 2020-06-30 ENCOUNTER — Encounter
Admission: RE | Admit: 2020-06-30 | Discharge: 2020-06-30 | Disposition: A | Discharge status: Home / Self Care | Payer: Medicare Other | Source: Ambulatory Visit | Attending: Podiatry | Admitting: Podiatry

## 2020-06-30 DIAGNOSIS — E785 Hyperlipidemia, unspecified: Secondary | ICD-10-CM | POA: Diagnosis not present

## 2020-06-30 DIAGNOSIS — Z01818 Encounter for other preprocedural examination: Secondary | ICD-10-CM | POA: Diagnosis not present

## 2020-06-30 NOTE — Progress Notes (Signed)
[facility] Perioperative Services: Pre-Admission/Anesthesia Testing   Date: 06/30/20 Name: Mike Farley MRN:   [id number]  Re: Consideration of preoperative prophylactic antibiotic change   Request sent to: Samara Deist, DPM (routed and/or faxed via [facility]  Planned Surgical Procedure(s):    Case: [id number] Date/Time: 07/04/20 0715   Procedure: METATARSAL HEAD EXCISION (Left Toe)   Anesthesia type: Choice   Pre-op diagnosis: M21.622- Tailor's bunion left foot   Location: [facility] OR ROOM 02 / [place] [facility]   Surgeons: Samara Deist, DPM    Notes: 1. Patient has a documented allergy to AMOXICILLIN only  The actual reaction to PCN is unknown and occurred > 10 years ago.   2. Received cephalosporin with no documented complications . CEFAZOLIN received on 02/01/2017  3. Screened as appropriate for cephalosporin use during medication reconciliation . No immediate angioedema, dysphagia, SOB, anaphylaxis symptoms. . No severe rash involving mucous membranes or skin necrosis. . No hospital admissions related to side effects of PCN/cephalosporin use.  . No documented reaction to PCN or cephalosporin in the last 10 years.  Request:  As an evidence based approach to reducing the rate of incidence for post-operative SSI and the development of MDROs, could an agent with narrower coverage for preoperative prophylaxis in this patient's upcoming surgical course be considered?   1. Currently ordered preoperative prophylactic ABX: clindamycin.   2. Specifically requesting change to cephalosporin (CEFAZOLIN).   3. Please communicate decision with me and I will change the orders in Epic as per your direction.   Things to consider:  Many patients report that they were "allergic" to PCN earlier in life, however this does not translate into a true lifelong allergy. Patients can lose sensitivity to specific IgE antibodies over time if PCN is avoided (Kleris &  Lugar, 2019).   Up to 10% of the adult population and 15% of hospitalized patients report an allergy to PCN, however clinical studies suggest that 90% of those reporting an allergy can tolerate PCN antibiotics (Kleris & Lugar, 2019).   Cross-sensitivity between PCN and cephalosporins has been documented as being as high as 10%, however this estimation included data believed to have been collected in a setting where there was contamination. Newer data suggests that the prevalence of cross-sensitivity between PCN and cephalosporins is actually estimated to be closer to 1% (Hermanides et al., 2018).    Patients labeled as PCN allergic, whether they are truly allergic or not, have been found to have inferior outcomes in terms of rates of serious infection, and these patients tend to have longer hospital stays (Bearden, 2019).   Treatment related secondary infections, such as Clostridioides difficile, have been linked to the improper use of broad spectrum antibiotics in patients improperly labeled as PCN allergic (Kleris & Lugar, 2019).   Anaphylaxis from cephalosporins is rare and the evidence suggests that there is no increased risk of an anaphylactic type reaction when cephalosporins are used in a PCN allergic patient (Pichichero, 2006).  Citations: Hermanides J, Lemkes BA, Prins Pearla Dubonnet MW, Terreehorst I. Presumed ?-Lactam Allergy and Cross-reactivity in the Operating Theater: A Practical Approach. Anesthesiology. 2018 Aug;129(2):335-342. doi: 10.1097/ALN.0000000000002252. PMID: 15056979.  Kleris, Roxton., & Lugar, P. L. (2019). Things We Do For No Reason: Failing to Question a Penicillin Allergy History. Journal of hospital medicine, 14(10), [phone]. Advance online publication. https://www.wallace-middleton.info/  Pichichero, M. E. (2006). Cephalosporins can be prescribed safely for penicillin-allergic patients. Journal of family medicine, 55(2), 106-112. Accessed:  https://cdn.mdedge.com/files/s5fs-public/Document/September-2017/5502JFP_AppliedEvidence1.pdf  Honor Loh, MSN, APRN, FNP-C, CEN [facility]  Peri-operative Services Nurse Practitioner FAX: [phone] 06/30/20 11:21 AM

## 2020-06-30 NOTE — Patient Instructions (Signed)
Your procedure is scheduled on: 07/04/20 Report to [place]. Stop at [place] before heading to Day Surgery To find out your arrival time please call [phone] between 1PM - 3PM on 07/03/20.  Remember: Instructions that are not followed completely may result in serious medical risk, up to and including death, or upon the discretion of your surgeon and anesthesiologist your surgery may need to be rescheduled.     _X__ 1. Do not eat food after midnight the night before your procedure.                 No gum chewing or hard candies. You may drink clear liquids up to 2 hours                 before you are scheduled to arrive for your surgery- DO not drink clear                 liquids within 2 hours of the start of your surgery.                 Clear Liquids include:  water, apple juice without pulp, clear carbohydrate                 drink such as Clearfast or Gatorade, Black Coffee or Tea (Do not add                 anything to coffee or tea). Diabetics water only  __X__2.  On the morning of surgery brush your teeth with toothpaste and water, you                 may rinse your mouth with mouthwash if you wish.  Do not swallow any              toothpaste of mouthwash.     _X__ 3.  No Alcohol for 24 hours before or after surgery.   _X__ 4.  Do Not Smoke or use e-cigarettes For 24 Hours Prior to Your Surgery.                 Do not use any chewable tobacco products for at least 6 hours prior to                 surgery.  ____  5.  Bring all medications with you on the day of surgery if instructed.   __X__  6.  Notify your doctor if there is any change in your medical condition      (cold, fever, infections).     Do not wear jewelry, make-up, hairpins, clips or nail polish. Do not wear lotions, powders, or perfumes.  Do not shave 48 hours prior to surgery. Men may shave face and neck. Do not bring valuables to the hospital.     [facility] is not responsible for any belongings or valuables.  Contacts, dentures/partials or body piercings may not be worn into surgery. Bring a case for your contacts, glasses or hearing aids, a denture cup will be supplied. Leave your suitcase in the car. After surgery it may be brought to your room. For patients admitted to the hospital, discharge time is determined by your treatment team.   Patients discharged the day of surgery will not be allowed to drive home.   Please read over the following fact sheets that you were given:   MRSA Information, CHG  __X__ Take these medicines the  morning of surgery with A SIP OF WATER:    1. donepezil (ARICEPT) 5 MG tablet  2. oxyCODONE (ROXICODONE) 15 MG immediate release tablet  3. rosuvastatin (CRESTOR) 10 MG tablet  4. valACYclovir (VALTREX) 500 MG tablet if needed  5.  6.  ____ Fleet Enema (as directed)   __X__ Use CHG Soap/SAGE wipes as directed  ____ Use inhalers on the day of surgery  ____ Stop metformin/Janumet/Farxiga 2 days prior to surgery    ____ Take 1/2 of usual insulin dose the night before surgery. No insulin the morning          of surgery.   ____ Stop Blood Thinners Coumadin/Plavix/Xarelto/Pleta/Pradaxa/Eliquis/Effient/Aspirin  on   Or contact your Surgeon, Cardiologist or Medical Doctor regarding  ability to stop your blood thinners  __X__ Stop Anti-inflammatories 7 days before surgery such as Advil, Ibuprofen, Motrin,  BC or Goodies Powder, Naprosyn, Naproxen, Aleve, Aspirin    __X__ Stop all herbal supplements, fish oil or vitamin E until after surgery. Stop Melatonin today 06/30/20   ____ Bring C-Pap to the hospital.

## 2020-07-02 ENCOUNTER — Other Ambulatory Visit: Payer: Self-pay

## 2020-07-02 ENCOUNTER — Other Ambulatory Visit
Admission: RE | Admit: 2020-07-02 | Discharge: 2020-07-02 | Disposition: A | Discharge status: Home / Self Care | Payer: Medicare Other | Source: Ambulatory Visit | Attending: Podiatry | Admitting: Podiatry

## 2020-07-02 DIAGNOSIS — Z01812 Encounter for preprocedural laboratory examination: Secondary | ICD-10-CM | POA: Diagnosis not present

## 2020-07-02 DIAGNOSIS — Z20822 Contact with and (suspected) exposure to covid-19: Secondary | ICD-10-CM | POA: Insufficient documentation

## 2020-07-02 LAB — SARS CORONAVIRUS 2 (TAT 6-24 HRS): SARS Coronavirus 2: NEGATIVE

## 2020-07-04 ENCOUNTER — Ambulatory Visit: Payer: Medicare Other | Admitting: Urgent Care

## 2020-07-04 ENCOUNTER — Other Ambulatory Visit: Payer: Self-pay

## 2020-07-04 ENCOUNTER — Encounter
Admission: RE | Disposition: A | Discharge status: Home / Self Care | Payer: Self-pay | Source: Home / Self Care | Attending: Podiatry

## 2020-07-04 ENCOUNTER — Ambulatory Visit
Admission: RE | Admit: 2020-07-04 | Discharge: 2020-07-04 | Disposition: A | Discharge status: Home / Self Care | Payer: Medicare Other | Attending: Podiatry | Admitting: Podiatry

## 2020-07-04 ENCOUNTER — Encounter: Payer: Self-pay | Admitting: Podiatry

## 2020-07-04 ENCOUNTER — Ambulatory Visit: Payer: Medicare Other

## 2020-07-04 DIAGNOSIS — Z87891 Personal history of nicotine dependence: Secondary | ICD-10-CM | POA: Diagnosis not present

## 2020-07-04 DIAGNOSIS — M205X2 Other deformities of toe(s) (acquired), left foot: Secondary | ICD-10-CM | POA: Diagnosis not present

## 2020-07-04 DIAGNOSIS — Z79899 Other long term (current) drug therapy: Secondary | ICD-10-CM | POA: Insufficient documentation

## 2020-07-04 DIAGNOSIS — Z880 Allergy status to penicillin: Secondary | ICD-10-CM | POA: Insufficient documentation

## 2020-07-04 DIAGNOSIS — M21622 Bunionette of left foot: Secondary | ICD-10-CM | POA: Insufficient documentation

## 2020-07-04 DIAGNOSIS — Z885 Allergy status to narcotic agent status: Secondary | ICD-10-CM | POA: Insufficient documentation

## 2020-07-04 HISTORY — PX: METATARSAL HEAD EXCISION: SHX5027

## 2020-07-04 SURGERY — EXCISION, METATARSAL BONE, HEAD
Anesthesia: General | Site: Toe | Laterality: Left

## 2020-07-04 MED ORDER — CHLORHEXIDINE GLUCONATE 0.12 % MT SOLN
OROMUCOSAL | Status: AC
  Administered 2020-07-04: 15 mL via OROMUCOSAL
  Filled 2020-07-04: qty 15

## 2020-07-04 MED ORDER — FAMOTIDINE 20 MG PO TABS: 20.0000 mg | ORAL_TABLET | Freq: Once | ORAL | Status: AC

## 2020-07-04 MED ORDER — PROPOFOL 10 MG/ML IV BOLUS
INTRAVENOUS | Status: DC | PRN
  Administered 2020-07-04: 130 mg via INTRAVENOUS
  Administered 2020-07-04: 40 mg via INTRAVENOUS

## 2020-07-04 MED ORDER — DEXAMETHASONE SODIUM PHOSPHATE 10 MG/ML IJ SOLN
INTRAMUSCULAR | Status: DC | PRN
  Administered 2020-07-04: 4 mg via INTRAVENOUS

## 2020-07-04 MED ORDER — ACETAMINOPHEN 10 MG/ML IV SOLN
INTRAVENOUS | Status: DC | PRN
  Administered 2020-07-04: 1000 mg via INTRAVENOUS

## 2020-07-04 MED ORDER — BUPIVACAINE LIPOSOME 1.3 % IJ SUSP
INTRAMUSCULAR | Status: AC
  Filled 2020-07-04: qty 20

## 2020-07-04 MED ORDER — CEFAZOLIN SODIUM-DEXTROSE 2-3 GM-%(50ML) IV SOLR
INTRAVENOUS | Status: DC | PRN
  Administered 2020-07-04: 2 g via INTRAVENOUS

## 2020-07-04 MED ORDER — ONDANSETRON HCL 4 MG/2ML IJ SOLN: 4.0000 mg | Freq: Once | INTRAMUSCULAR | Status: DC | PRN

## 2020-07-04 MED ORDER — POVIDONE-IODINE 7.5 % EX SOLN
Freq: Once | CUTANEOUS | Status: AC
  Filled 2020-07-04: qty 118

## 2020-07-04 MED ORDER — ONDANSETRON HCL 4 MG/2ML IJ SOLN
INTRAMUSCULAR | Status: DC | PRN
  Administered 2020-07-04: 4 mg via INTRAVENOUS

## 2020-07-04 MED ORDER — CHLORHEXIDINE GLUCONATE 0.12 % MT SOLN: 15.0000 mL | Freq: Once | OROMUCOSAL | Status: AC

## 2020-07-04 MED ORDER — CLINDAMYCIN PHOSPHATE 900 MG/50ML IV SOLN
INTRAVENOUS | Status: AC
  Filled 2020-07-04: qty 50

## 2020-07-04 MED ORDER — FENTANYL CITRATE (PF) 100 MCG/2ML IJ SOLN
INTRAMUSCULAR | Status: DC | PRN
  Administered 2020-07-04: 50 ug via INTRAVENOUS

## 2020-07-04 MED ORDER — FENTANYL CITRATE (PF) 100 MCG/2ML IJ SOLN: 25.0000 ug | INTRAMUSCULAR | Status: DC | PRN

## 2020-07-04 MED ORDER — PHENYLEPHRINE HCL (PRESSORS) 10 MG/ML IV SOLN
INTRAVENOUS | Status: AC
  Filled 2020-07-04: qty 1

## 2020-07-04 MED ORDER — LIDOCAINE HCL (PF) 1 % IJ SOLN
INTRAMUSCULAR | Status: AC
  Filled 2020-07-04: qty 30

## 2020-07-04 MED ORDER — PROPOFOL 500 MG/50ML IV EMUL
INTRAVENOUS | Status: AC
  Filled 2020-07-04: qty 50

## 2020-07-04 MED ORDER — EPHEDRINE SULFATE 50 MG/ML IJ SOLN
INTRAMUSCULAR | Status: DC | PRN
  Administered 2020-07-04 (×2): 10 mg via INTRAVENOUS

## 2020-07-04 MED ORDER — ORAL CARE MOUTH RINSE: 15.0000 mL | Freq: Once | OROMUCOSAL | Status: AC

## 2020-07-04 MED ORDER — OXYCODONE HCL 5 MG/5ML PO SOLN: 5.0000 mg | Freq: Once | ORAL | Status: DC | PRN

## 2020-07-04 MED ORDER — FAMOTIDINE 20 MG PO TABS
ORAL_TABLET | ORAL | Status: AC
  Administered 2020-07-04: 20 mg via ORAL
  Filled 2020-07-04: qty 1

## 2020-07-04 MED ORDER — BUPIVACAINE HCL (PF) 0.5 % IJ SOLN
INTRAMUSCULAR | Status: AC
  Filled 2020-07-04: qty 30

## 2020-07-04 MED ORDER — CLINDAMYCIN PHOSPHATE 900 MG/50ML IV SOLN: 900.0000 mg | INTRAVENOUS | Status: DC

## 2020-07-04 MED ORDER — LACTATED RINGERS IV SOLN: INTRAVENOUS | Status: DC

## 2020-07-04 MED ORDER — OXYCODONE HCL 5 MG PO TABS: 5.0000 mg | ORAL_TABLET | Freq: Once | ORAL | Status: DC | PRN

## 2020-07-04 MED ORDER — BUPIVACAINE LIPOSOME 1.3 % IJ SUSP
INTRAMUSCULAR | Status: DC | PRN
  Administered 2020-07-04: 17 mL

## 2020-07-04 MED ORDER — LIDOCAINE HCL (CARDIAC) PF 100 MG/5ML IV SOSY
PREFILLED_SYRINGE | INTRAVENOUS | Status: DC | PRN
  Administered 2020-07-04: 80 mg via INTRAVENOUS

## 2020-07-04 MED ORDER — ACETAMINOPHEN 10 MG/ML IV SOLN
INTRAVENOUS | Status: AC
  Filled 2020-07-04: qty 100

## 2020-07-04 MED ORDER — FENTANYL CITRATE (PF) 100 MCG/2ML IJ SOLN
INTRAMUSCULAR | Status: AC
  Filled 2020-07-04: qty 2

## 2020-07-04 SURGICAL SUPPLY — 52 items
"PENCIL ELECTRO HAND CTR " (MISCELLANEOUS) ×1 IMPLANT
BLADE MED AGGRESSIVE (BLADE) ×1 IMPLANT
BLADE OSC/SAGITTAL MD 5.5X18 (BLADE) ×2 IMPLANT
BLADE SURG 15 STRL LF DISP TIS (BLADE) ×2 IMPLANT
BLADE SURG 15 STRL SS (BLADE) ×4
BLADE SURG MINI STRL (BLADE) ×2 IMPLANT
BNDG CMPR STD VLCR NS LF 5.8X4 (GAUZE/BANDAGES/DRESSINGS) ×1
BNDG CMPR STD VLCR NS LF 5.8X6 (GAUZE/BANDAGES/DRESSINGS) ×2
BNDG CONFORM 3 STRL LF (GAUZE/BANDAGES/DRESSINGS) ×2 IMPLANT
BNDG ELASTIC 4X5.8 VLCR NS LF (GAUZE/BANDAGES/DRESSINGS) ×2 IMPLANT
BNDG ELASTIC 6X5.8 VLCR NS LF (GAUZE/BANDAGES/DRESSINGS) ×4 IMPLANT
BNDG ESMARK 4X12 TAN STRL LF (GAUZE/BANDAGES/DRESSINGS) ×2 IMPLANT
COVER WAND RF STERILE (DRAPES) ×2 IMPLANT
CUFF TOURN SGL QUICK 12 (TOURNIQUET CUFF) IMPLANT
CUFF TOURN SGL QUICK 18X4 (TOURNIQUET CUFF) ×1 IMPLANT
DRAPE FLUOR MINI C-ARM 54X84 (DRAPES) ×2 IMPLANT
DURAPREP 26ML APPLICATOR (WOUND CARE) ×2 IMPLANT
ELECT REM PT RETURN 9FT ADLT (ELECTROSURGICAL) ×2
ELECTRODE REM PT RTRN 9FT ADLT (ELECTROSURGICAL) ×1 IMPLANT
GAUZE 4X4 16PLY RFD (DISPOSABLE) ×2 IMPLANT
GAUZE SPONGE 4X4 12PLY STRL (GAUZE/BANDAGES/DRESSINGS) ×2 IMPLANT
GAUZE XEROFORM 1X8 LF (GAUZE/BANDAGES/DRESSINGS) ×2 IMPLANT
GLOVE BIO SURGEON STRL SZ7.5 (GLOVE) ×2 IMPLANT
GLOVE INDICATOR 8.0 STRL GRN (GLOVE) ×2 IMPLANT
GOWN STRL REUS W/ TWL XL LVL3 (GOWN DISPOSABLE) ×2 IMPLANT
GOWN STRL REUS W/TWL XL LVL3 (GOWN DISPOSABLE) ×4
MANIFOLD NEPTUNE II (INSTRUMENTS) ×2 IMPLANT
NDL FILTER BLUNT 18X1 1/2 (NEEDLE) ×1 IMPLANT
NEEDLE FILTER BLUNT 18X 1/2SAF (NEEDLE) ×1
NEEDLE FILTER BLUNT 18X1 1/2 (NEEDLE) ×1 IMPLANT
NEEDLE HYPO 22GX1.5 SAFETY (NEEDLE) ×2 IMPLANT
NS IRRIG 500ML POUR BTL (IV SOLUTION) ×2 IMPLANT
PACK EXTREMITY ARMC (MISCELLANEOUS) ×2 IMPLANT
PAD CAST CTTN 4X4 STRL (SOFTGOODS) ×1 IMPLANT
PAD PREP 24X41 OB/GYN DISP (PERSONAL CARE ITEMS) ×2 IMPLANT
PADDING CAST COTTON 4X4 STRL (SOFTGOODS) ×2
PENCIL ELECTRO HAND CTR (MISCELLANEOUS) ×2 IMPLANT
PIN BALLS 3/8 F/.054-.062 WIRE (MISCELLANEOUS) ×2 IMPLANT
RASP SM TEAR CROSS CUT (RASP) ×2 IMPLANT
STOCKINETTE STRL 6IN 960660 (GAUZE/BANDAGES/DRESSINGS) ×2 IMPLANT
STRAP SAFETY 5IN WIDE (MISCELLANEOUS) ×2 IMPLANT
STRIP CLOSURE SKIN 1/4X4 (GAUZE/BANDAGES/DRESSINGS) ×2 IMPLANT
SUT ETHILON 5-0 FS-2 18 BLK (SUTURE) ×1 IMPLANT
SUT MNCRL 4-0 (SUTURE) ×2
SUT MNCRL 4-0 27XMFL (SUTURE) ×1
SUT VIC AB 4-0 SH 27 (SUTURE) ×2
SUT VIC AB 4-0 SH 27XANBCTRL (SUTURE) IMPLANT
SUTURE MNCRL 4-0 27XMF (SUTURE) ×1 IMPLANT
SYR 10ML LL (SYRINGE) ×2 IMPLANT
SYR 50ML LL SCALE MARK (SYRINGE) ×2 IMPLANT
WIRE Z .045 C-WIRE SPADE TIP (WIRE) ×2 IMPLANT
WIRE Z .062 C-WIRE SPADE TIP (WIRE) ×2 IMPLANT

## 2020-07-04 NOTE — H&P (Signed)
HISTORY AND PHYSICAL INTERVAL NOTE:  07/04/2020  7:27 AM  Mike Farley  has presented today for surgery, with the diagnosis of M21.622- Tailor's bunion left foot.  The various methods of treatment have been discussed with the patient.  No guarantees were given.  After consideration of risks, benefits and other options for treatment, the patient has consented to surgery.  I have reviewed the patients' chart and labs.     A history and physical examination was performed in my office.  The patient was reexamined.  There have been no changes to this history and physical examination.  Mike Farley A

## 2020-07-04 NOTE — Anesthesia Procedure Notes (Signed)
Procedure Name: LMA Insertion Date/Time: 07/04/2020 7:40 AM Performed by: Johney Maine, CRNA Pre-anesthesia Checklist: Patient identified, Patient being monitored, Timeout performed, Emergency Drugs available and Suction available Patient Re-evaluated:Patient Re-evaluated prior to induction Oxygen Delivery Method: Circle system utilized Preoxygenation: Pre-oxygenation with 100% oxygen Induction Type: IV induction Ventilation: Mask ventilation without difficulty LMA: LMA inserted LMA Size: 4.0 Tube type: Oral Number of attempts: 1 Placement Confirmation: positive ETCO2 and breath sounds checked- equal and bilateral Tube secured with: Tape Dental Injury: Teeth and Oropharynx as per pre-operative assessment

## 2020-07-04 NOTE — Op Note (Signed)
Operative note   Surgeon:Shean Gerding Lawyer: None    Preop diagnosis: Painful tailor's bunion left fifth metatarsal head    Postop diagnosis: Same    Procedure: Excision distal fifth metatarsal head left foot    EBL: None    Anesthesia:local and general    Hemostasis: Mid calf tourniquet inflated to 200 mmHg for 18 minutes    Specimen: None    Complications: None    Operative indications:Mike Farley is an 70 y.o. that presents today for surgical intervention.  The risks/benefits/alternatives/complications have been discussed and consent has been given.    Procedure:  Patient was brought into the OR and placed on the operating table in thesupine position. After anesthesia was obtained theleft lower extremity was prepped and draped in usual sterile fashion.  Attention was directed to the dorsal lateral left fifth MTPJ where a dorsal incision was performed.  Sharp and blunt dissection carried down to the capsule.  Longitudinal capsulotomy was performed.  This time the head of the fifth metatarsal was then exposed.  At the level of the surgical neck the fifth metatarsal head was then excised.  This was removed from the surgical field in toto.  The wound was flushed with copious amounts of irrigation.  Intraoperative fluoroscopy was used to identify the excision of the bone.  The bone edges were smoothed with a power rasp.  Final flushing was performed and closure was then performed with a 4-0 Vicryl for the deeper capsular and subcutaneous tissue and a 4-0 Monocryl for the skin.  The area was then infiltrated with 7 cc of Exparel long-acting anesthetic postoperatively.    Patient tolerated the procedure and anesthesia well.  Was transported from the OR to the PACU with all vital signs stable and vascular status intact. To be discharged per routine protocol.  Will follow up in approximately 1 week in the outpatient clinic.

## 2020-07-04 NOTE — Transfer of Care (Signed)
Immediate Anesthesia Transfer of Care Note  Patient: Mike Farley  Procedure(s) Performed: METATARSAL HEAD EXCISION (Left Toe)  Patient Location: PACU  Anesthesia Type:General  Level of Consciousness: awake, alert  and oriented  Airway & Oxygen Therapy: Patient Spontanous Breathing and Patient connected to face mask oxygen  Post-op Assessment: Report given to RN and Post -op Vital signs reviewed and stable  Post vital signs: Reviewed and stable  Last Vitals:  Vitals Value Taken Time  BP    Temp 36.2 C 07/04/20 0822  Pulse    Resp    SpO2      Last Pain:  Vitals:   07/04/20 0624  TempSrc: Temporal  PainSc: 7          Complications: No complications documented.

## 2020-07-04 NOTE — Discharge Instructions (Signed)
       AMBULATORY SURGERY  DISCHARGE INSTRUCTIONS   1) The drugs that you were given will stay in your system until tomorrow so for the next 24 hours you should not:  A) Drive an automobile B) Make any legal decisions C) Drink any alcoholic beverage   2) You may resume regular meals tomorrow.  Today it is better to start with liquids and gradually work up to solid foods.  You may eat anything you prefer, but it is better to start with liquids, then soup and crackers, and gradually work up to solid foods.   3) Please notify your doctor immediately if you have any unusual bleeding, trouble breathing, redness and pain at the surgery site, drainage, fever, or pain not relieved by medication.    4) Additional Instructions:        Please contact your physician with any problems or Same Day Surgery at [phone], Monday through Friday 6 am to 4 pm, or [place] [facility] [place] Main number at [phone].  [facility]  POST OPERATIVE INSTRUCTIONS FOR DR. TROXLER, DR. FOWLER, AND DR. [facility] PODIATRY [facility]PART[facility]   1. Take your medication as prescribed.  Pain medication should be taken only as needed.  2. Keep the dressing clean, dry and intact.  3. Keep your foot elevated above the heart level for the first 48 hours.  4. Walking to the bathroom and brief periods of walking are acceptable, unless we have instructed you to be non-weight bearing.  5. Always wear your post-op shoe when walking.  Always use your crutches if you are to be non-weight bearing.  6. Do not take a shower. Baths are permissible as long as the foot is kept out of the water.   7. Every hour you are awake:  - Bend your knee 15 times. - Flex foot 15 times - Massage calf 15 times  8. Call [facility] ([phone]) if any of the following problems occur: - You develop a temperature or fever. - The bandage becomes saturated with  blood. - Medication does not stop your pain. - Injury of the foot occurs. - Any symptoms of infection including redness, odor, or red streaks running from wound.  

## 2020-07-04 NOTE — Anesthesia Preprocedure Evaluation (Signed)
Anesthesia Evaluation  Patient identified by MRN, date of birth, ID band Patient awake    Reviewed: Allergy & Precautions, H&P , NPO status , Patient's Chart, lab work & pertinent test results  History of Anesthesia Complications Negative for: history of anesthetic complications  Airway Mallampati: II  TM Distance: >3 FB Neck ROM: limited    Dental  (+) Chipped   Pulmonary neg sleep apnea, neg COPD, former smoker,    breath sounds clear to auscultation       Cardiovascular (-) angina(-) Past MI and (-) Cardiac Stents negative cardio ROS  (-) dysrhythmias  Rhythm:regular Rate:Normal     Neuro/Psych  Headaches, PSYCHIATRIC DISORDERS Anxiety Depression Cervical fusion    GI/Hepatic negative GI ROS, Neg liver ROS,   Endo/Other  negative endocrine ROS  Renal/GU      Musculoskeletal   Abdominal   Peds  Hematology negative hematology ROS (+)   Anesthesia Other Findings Past Medical History: No date: Anxiety No date: Arthritis     Comment:  shoulder No date: Cancer ([place])     Comment:  prostate ca - Md just watching No date: Cervical pain (neck)     Comment:  limited mobility No date: Chronic pain No date: Depression     Comment:  years ago (minor) No date: HLD (hyperlipidemia) No date: Hx of cold sores No date: Mild cognitive impairment with memory loss  Past Surgical History: 2011: ANTERIOR FUSION CERVICAL SPINE     Comment:  4-5 levels No date: APPENDECTOMY 09/28/2017: BONE EXCISION; Left     Comment:  Procedure: BONE EXCISION-TAILORS  EXOSTECTOMY;  Surgeon:              Samara Deist, DPM;  Location: [place];                Service: Podiatry;  Laterality: Left;  IVA LOCAL 2008: CERVICAL DISCECTOMY     Comment:  C2-C7 Fused  No date: CHOLECYSTECTOMY 2020: COLONOSCOPY 2004: ELBOW FRACTURE SURGERY; Left 03/15/2019: PROSTATE BIOPSY; N/A     Comment:  Procedure: PROSTATE BIOPSY Uro Nav;   Surgeon: Royston Cowper, MD;  Location: [facility];  Service: Urology;                Laterality: N/A; 04/22/2020: RADIOLOGY WITH ANESTHESIA; N/A     Comment:  Procedure: MRI WITH ANESTHESIA BRAIN WITHOUT CONTRAST;                Surgeon: Radiologist, Medication, MD;  Location: [place];                Service: Radiology;  Laterality: N/A; No date: SHOULDER SURGERY; Right No date: TONSILLECTOMY AND ADENOIDECTOMY 2005: TOTAL KNEE ARTHROPLASTY; Right     Comment:  x 4  BMI    Body Mass Index: 23.33 kg/m      Reproductive/Obstetrics negative OB ROS                             Anesthesia Physical Anesthesia Plan  ASA: II  Anesthesia Plan: General LMA   Post-op Pain Management:    Induction:   PONV Risk Score and Plan: Dexamethasone, Ondansetron, Midazolam and Treatment may vary due to age or medical condition  Airway Management Planned:   Additional Equipment:   Intra-op Plan:   Post-operative Plan:   Informed Consent: I have reviewed the patients  History and Physical, chart, labs and discussed the procedure including the risks, benefits and alternatives for the proposed anesthesia with the patient or authorized representative who has indicated his/her understanding and acceptance.     Dental Advisory Given  Plan Discussed with: Anesthesiologist, CRNA and Surgeon  Anesthesia Plan Comments:         Anesthesia Quick Evaluation

## 2020-07-07 NOTE — Anesthesia Postprocedure Evaluation (Signed)
Anesthesia Post Note  Patient: Mike Farley  Procedure(s) Performed: METATARSAL HEAD EXCISION (Left Toe)  Patient location during evaluation: PACU Anesthesia Type: General Level of consciousness: awake and alert Pain management: pain level controlled Vital Signs Assessment: post-procedure vital signs reviewed and stable Respiratory status: spontaneous breathing, nonlabored ventilation and respiratory function stable Cardiovascular status: blood pressure returned to baseline and stable Postop Assessment: no apparent nausea or vomiting Anesthetic complications: no   No complications documented.   Last Vitals:  Vitals:   07/04/20 0857 07/04/20 0918  BP: (!) 144/73 135/74  Pulse: 70 64  Resp: 16 16  Temp: 36.6 C (!) 36.1 C  SpO2: 99% 100%    Last Pain:  Vitals:   07/07/20 0814  TempSrc:   PainSc: 0-No pain                 Tera Mater

## 2020-07-10 DIAGNOSIS — M21622 Bunionette of left foot: Secondary | ICD-10-CM | POA: Diagnosis not present

## 2020-07-23 DIAGNOSIS — M4802 Spinal stenosis, cervical region: Secondary | ICD-10-CM | POA: Diagnosis not present

## 2020-07-23 DIAGNOSIS — M47812 Spondylosis without myelopathy or radiculopathy, cervical region: Secondary | ICD-10-CM | POA: Diagnosis not present

## 2020-07-23 DIAGNOSIS — G894 Chronic pain syndrome: Secondary | ICD-10-CM | POA: Diagnosis not present

## 2020-07-23 DIAGNOSIS — Z79891 Long term (current) use of opiate analgesic: Secondary | ICD-10-CM | POA: Diagnosis not present

## 2020-08-12 DIAGNOSIS — M21622 Bunionette of left foot: Secondary | ICD-10-CM | POA: Diagnosis not present

## 2020-08-18 DIAGNOSIS — M47816 Spondylosis without myelopathy or radiculopathy, lumbar region: Secondary | ICD-10-CM | POA: Diagnosis not present

## 2020-08-18 DIAGNOSIS — M533 Sacrococcygeal disorders, not elsewhere classified: Secondary | ICD-10-CM | POA: Diagnosis not present

## 2020-08-18 DIAGNOSIS — M5416 Radiculopathy, lumbar region: Secondary | ICD-10-CM | POA: Diagnosis not present

## 2020-08-18 DIAGNOSIS — M545 Low back pain, unspecified: Secondary | ICD-10-CM | POA: Diagnosis not present

## 2020-08-18 DIAGNOSIS — M47812 Spondylosis without myelopathy or radiculopathy, cervical region: Secondary | ICD-10-CM | POA: Diagnosis not present

## 2020-08-21 ENCOUNTER — Encounter: Payer: Self-pay | Admitting: Internal Medicine

## 2020-08-21 DIAGNOSIS — C61 Malignant neoplasm of prostate: Secondary | ICD-10-CM

## 2020-08-21 DIAGNOSIS — Z125 Encounter for screening for malignant neoplasm of prostate: Secondary | ICD-10-CM

## 2020-08-25 DIAGNOSIS — M461 Sacroiliitis, not elsewhere classified: Secondary | ICD-10-CM | POA: Diagnosis not present

## 2020-08-25 NOTE — Telephone Encounter (Signed)
Yes, ok to add psa.  If wants to get all labs scheduled - ok to schedule.  Remainder of labs would need to be 3 month after last labs.

## 2020-08-28 DIAGNOSIS — Z79899 Other long term (current) drug therapy: Secondary | ICD-10-CM | POA: Diagnosis not present

## 2020-08-28 DIAGNOSIS — N400 Enlarged prostate without lower urinary tract symptoms: Secondary | ICD-10-CM | POA: Diagnosis not present

## 2020-08-28 DIAGNOSIS — C61 Malignant neoplasm of prostate: Secondary | ICD-10-CM | POA: Diagnosis not present

## 2020-08-28 DIAGNOSIS — R972 Elevated prostate specific antigen [PSA]: Secondary | ICD-10-CM | POA: Diagnosis not present

## 2020-08-28 DIAGNOSIS — Z87891 Personal history of nicotine dependence: Secondary | ICD-10-CM | POA: Diagnosis not present

## 2020-09-01 DIAGNOSIS — M47897 Other spondylosis, lumbosacral region: Secondary | ICD-10-CM | POA: Diagnosis not present

## 2020-09-04 DIAGNOSIS — C61 Malignant neoplasm of prostate: Secondary | ICD-10-CM | POA: Diagnosis not present

## 2020-09-04 DIAGNOSIS — M47897 Other spondylosis, lumbosacral region: Secondary | ICD-10-CM | POA: Diagnosis not present

## 2020-09-17 DIAGNOSIS — G894 Chronic pain syndrome: Secondary | ICD-10-CM | POA: Diagnosis not present

## 2020-09-17 DIAGNOSIS — M47812 Spondylosis without myelopathy or radiculopathy, cervical region: Secondary | ICD-10-CM | POA: Diagnosis not present

## 2020-09-17 DIAGNOSIS — M4802 Spinal stenosis, cervical region: Secondary | ICD-10-CM | POA: Diagnosis not present

## 2020-09-17 DIAGNOSIS — Z79891 Long term (current) use of opiate analgesic: Secondary | ICD-10-CM | POA: Diagnosis not present

## 2020-09-17 DIAGNOSIS — M47897 Other spondylosis, lumbosacral region: Secondary | ICD-10-CM | POA: Diagnosis not present

## 2020-09-23 DIAGNOSIS — M47897 Other spondylosis, lumbosacral region: Secondary | ICD-10-CM | POA: Diagnosis not present

## 2020-09-23 DIAGNOSIS — Z23 Encounter for immunization: Secondary | ICD-10-CM | POA: Diagnosis not present

## 2020-09-25 DIAGNOSIS — M545 Low back pain, unspecified: Secondary | ICD-10-CM | POA: Diagnosis not present

## 2020-09-25 DIAGNOSIS — R030 Elevated blood-pressure reading, without diagnosis of hypertension: Secondary | ICD-10-CM | POA: Diagnosis not present

## 2020-09-25 DIAGNOSIS — M47816 Spondylosis without myelopathy or radiculopathy, lumbar region: Secondary | ICD-10-CM | POA: Diagnosis not present

## 2020-09-25 DIAGNOSIS — M461 Sacroiliitis, not elsewhere classified: Secondary | ICD-10-CM | POA: Diagnosis not present

## 2020-09-25 DIAGNOSIS — M533 Sacrococcygeal disorders, not elsewhere classified: Secondary | ICD-10-CM | POA: Diagnosis not present

## 2020-09-26 ENCOUNTER — Other Ambulatory Visit: Payer: Self-pay | Admitting: Neurosurgery

## 2020-09-26 DIAGNOSIS — M545 Low back pain, unspecified: Secondary | ICD-10-CM

## 2020-09-29 ENCOUNTER — Ambulatory Visit (INDEPENDENT_AMBULATORY_CARE_PROVIDER_SITE_OTHER): Payer: Medicare Other | Admitting: Internal Medicine

## 2020-09-29 ENCOUNTER — Other Ambulatory Visit: Payer: Self-pay

## 2020-09-29 ENCOUNTER — Encounter: Payer: Self-pay | Admitting: Internal Medicine

## 2020-09-29 DIAGNOSIS — L989 Disorder of the skin and subcutaneous tissue, unspecified: Secondary | ICD-10-CM | POA: Diagnosis not present

## 2020-09-29 DIAGNOSIS — R739 Hyperglycemia, unspecified: Secondary | ICD-10-CM | POA: Diagnosis not present

## 2020-09-29 DIAGNOSIS — E7800 Pure hypercholesterolemia, unspecified: Secondary | ICD-10-CM

## 2020-09-29 DIAGNOSIS — C61 Malignant neoplasm of prostate: Secondary | ICD-10-CM | POA: Diagnosis not present

## 2020-09-29 DIAGNOSIS — G479 Sleep disorder, unspecified: Secondary | ICD-10-CM | POA: Diagnosis not present

## 2020-09-29 DIAGNOSIS — D649 Anemia, unspecified: Secondary | ICD-10-CM

## 2020-09-29 DIAGNOSIS — I700 Atherosclerosis of aorta: Secondary | ICD-10-CM

## 2020-09-29 DIAGNOSIS — F32A Depression, unspecified: Secondary | ICD-10-CM

## 2020-09-29 DIAGNOSIS — R413 Other amnesia: Secondary | ICD-10-CM

## 2020-09-29 DIAGNOSIS — F320 Major depressive disorder, single episode, mild: Secondary | ICD-10-CM | POA: Diagnosis not present

## 2020-09-29 MED ORDER — MUPIROCIN 2 % EX OINT: 1.0000 "application " | TOPICAL_OINTMENT | Freq: Two times a day (BID) | CUTANEOUS | 0 refills | Status: DC

## 2020-09-29 NOTE — Progress Notes (Signed)
Patient ID: Mike Farley, male   DOB: 05/03/1951, 70 y.o.   MRN: [id number]   Subjective:    Patient ID: Mike Farley, male    DOB: Mar 18, 1951, 70 y.o.   MRN: [id number]  HPI This visit occurred during the SARS-CoV-2 public health emergency.  Safety protocols were in place, including screening questions prior to the visit, additional usage of staff PPE, and extensive cleaning of exam room while observing appropriate contact time as indicated for disinfecting solutions.  Patient here for a scheduled follow up.  Here to follow up regarding his cholesterol and increased stress.  Has had issues with chronic pain.  Recently - pain left lower back.  Worsened over the last 2-3 months.  Has seen chiropractor.  S/p foot surgery 06/2020.  Has been referred to pain management.  Scheduled for MRI back.  Further w/up pending results.  Increased stress related to this and related to his prostate.  Scheduled for prostate MRI 10/02/20.  Discussed stress.  Feeling some depression.  Feels needs something to help level things out.  No SI.  Tries to stay active.  No chest pain or sob with increased activity or exertion.  Breathing stable.  No acid reflux reported. No abdominal pain.  Bowels moving.  Is concerned regarding skin lesions.  Had one on scalp - scraped off.  Small red area remains.    Past Medical History:  Diagnosis Date  . Anxiety   . Arthritis    shoulder  . Cancer [facility])    prostate ca - Md just watching  . Cervical pain (neck)    limited mobility  . Chronic pain   . Depression    years ago (minor)  . HLD (hyperlipidemia)   . Hx of cold sores   . Mild cognitive impairment with memory loss    Past Surgical History:  Procedure Laterality Date  . ANTERIOR FUSION CERVICAL SPINE  2011   4-5 levels  . APPENDECTOMY    . BONE EXCISION Left 09/28/2017   Procedure: BONE EXCISION-TAILORS  EXOSTECTOMY;  Surgeon: Samara Deist, DPM;  Location: [place];  Service: Podiatry;  Laterality:  Left;  IVA LOCAL  . CERVICAL DISCECTOMY  2008   C2-C7 Fused   . CHOLECYSTECTOMY    . COLONOSCOPY  2020  . ELBOW FRACTURE SURGERY Left 2004  . METATARSAL HEAD EXCISION Left 07/04/2020   Procedure: METATARSAL HEAD EXCISION;  Surgeon: Samara Deist, DPM;  Location: [facility];  Service: Podiatry;  Laterality: Left;  . PROSTATE BIOPSY N/A 03/15/2019   Procedure: PROSTATE BIOPSY Uro Nav;  Surgeon: Royston Cowper, MD;  Location: [facility];  Service: Urology;  Laterality: N/A;  . RADIOLOGY WITH ANESTHESIA N/A 04/22/2020   Procedure: MRI WITH ANESTHESIA BRAIN WITHOUT CONTRAST;  Surgeon: Radiologist, Medication, MD;  Location: [place];  Service: Radiology;  Laterality: N/A;  . SHOULDER SURGERY Right   . TONSILLECTOMY AND ADENOIDECTOMY    . TOTAL KNEE ARTHROPLASTY Right 2005   x 4   Family History  Problem Relation Age of Onset  . Arthritis Mother   . Cancer Mother        colon & breast cnacer  . Mental illness Mother        Depression & bipolar  . Cancer Father        lung & prostate cancer  . Alcohol abuse Brother   . Kidney disease Brother    Social History   Socioeconomic History  . Marital status: Married  Spouse name: Not on file  . Number of children: Not on file  . Years of education: Not on file  . Highest education level: Not on file  Occupational History  . Not on file  Tobacco Use  . Smoking status: Former Research scientist (life sciences)  . Smokeless tobacco: Current User    Types: Chew  . Tobacco comment: Quit yrs ago  Vaping Use  . Vaping Use: Never used  Substance and Sexual Activity  . Alcohol use: Yes    Alcohol/week: 7.0 standard drinks    Types: 7 Standard drinks or equivalent per week    Comment: beer/wine  . Drug use: Never  . Sexual activity: Not Currently  Other Topics Concern  . Not on file  Social History Narrative  . Not on file   Social Determinants of Health   Financial Resource Strain: Not on file  Food Insecurity: Not on file  Transportation Needs: Not on file   Physical Activity: Not on file  Stress: Not on file  Social Connections: Not on file    Outpatient Encounter Medications as of 09/29/2020  Medication Sig  . cholecalciferol (VITAMIN D3) 25 MCG (1000 UT) tablet Take 1,000 Units by mouth 3 (three) times a week.   . cyanocobalamin 1000 MCG tablet Take 1,000 mcg by mouth daily.   Marland Kitchen donepezil (ARICEPT) 5 MG tablet Take 5 mg by mouth daily.  . Melatonin 10 MG TABS Take 30 mg by mouth at bedtime as needed (sleep).  . Multiple Vitamin (MULTIVITAMIN) tablet Take 1 tablet by mouth daily. ABC Complete MEN  . mupirocin ointment (BACTROBAN) 2 % Apply 1 application topically 2 (two) times daily.  Marland Kitchen oxyCODONE (ROXICODONE) 15 MG immediate release tablet Take 15 mg by mouth 4 (four) times daily.  Marland Kitchen oxymetazoline (AFRIN) 0.05 % nasal spray Place 1 spray into both nostrils at bedtime.  . rosuvastatin (CRESTOR) 10 MG tablet TAKE 1 TABLET BY MOUTH DAILY (Patient taking differently: Take 10 mg by mouth daily.)  . tamsulosin (FLOMAX) 0.4 MG CAPS capsule Take 0.4 mg by mouth daily.  . valACYclovir (VALTREX) 500 MG tablet Take 1 tablet (500 mg total) by mouth 2 (two) times daily. (Patient taking differently: Take 500 mg by mouth daily as needed (Cold sores).)  . [DISCONTINUED] QUEtiapine (SEROQUEL) 300 MG tablet TAKE ONE TABLET AT BEDTIME (Patient taking differently: Take 300 mg by mouth at bedtime.)   No facility-administered encounter medications on file as of 09/29/2020.    Review of Systems  Constitutional: Negative for appetite change and unexpected weight change.  HENT: Negative for congestion and sinus pressure.   Respiratory: Negative for cough, chest tightness and shortness of breath.   Cardiovascular: Negative for chest pain, palpitations and leg swelling.  Gastrointestinal: Negative for abdominal pain, diarrhea, nausea and vomiting.  Genitourinary: Negative for difficulty urinating and dysuria.  Musculoskeletal: Negative for joint swelling and  myalgias.  Skin: Negative for rash.  Neurological: Negative for dizziness and headaches.  Psychiatric/Behavioral: Negative for agitation.       Increased stress. Some mild depression.        Objective:    Physical Exam Vitals reviewed.  Constitutional:      General: He is not in acute distress.    Appearance: Normal appearance. He is well-developed.  HENT:     Head: Normocephalic and atraumatic.     Right Ear: External ear normal.     Left Ear: External ear normal.  Eyes:     General: No scleral icterus.  Right eye: No discharge.        Left eye: No discharge.     Conjunctiva/sclera: Conjunctivae normal.  Cardiovascular:     Rate and Rhythm: Normal rate and regular rhythm.  Pulmonary:     Effort: Pulmonary effort is normal. No respiratory distress.     Breath sounds: Normal breath sounds.  Abdominal:     General: Bowel sounds are normal.     Palpations: Abdomen is soft.     Tenderness: There is no abdominal tenderness.  Musculoskeletal:        General: No swelling or tenderness.     Cervical back: Neck supple. No tenderness.  Lymphadenopathy:     Cervical: No cervical adenopathy.  Skin:    Findings: No erythema or rash.  Neurological:     Mental Status: He is alert.  Psychiatric:        Mood and Affect: Mood normal.        Behavior: Behavior normal.     BP 122/64 (BP Location: Left Arm, Patient Position: Sitting, Cuff Size: Normal)   Pulse 75   Temp 98.3 F (36.8 C) (Oral)   Ht 5' 9"  (1.753 m)   Wt 162 lb 6.4 oz (73.7 kg)   SpO2 98%   BMI 23.98 kg/m  Wt Readings from Last 3 Encounters:  09/29/20 162 lb 6.4 oz (73.7 kg)  07/04/20 158 lb (71.7 kg)  06/30/20 160 lb (72.6 kg)     Lab Results  Component Value Date   WBC 6.0 05/30/2020   HGB 14.0 05/30/2020   HCT 41.3 05/30/2020   PLT 267.0 05/30/2020   GLUCOSE 105 (H) 06/05/2020   CHOL 170 05/30/2020   TRIG 75.0 05/30/2020   HDL 80.30 05/30/2020   LDLCALC 74 05/30/2020   ALT 14 05/30/2020    AST 19 05/30/2020   NA 135 06/05/2020   K 4.3 06/05/2020   CL 100 06/05/2020   CREATININE 0.89 06/05/2020   BUN 24 (H) 06/05/2020   CO2 30 06/05/2020   TSH 2.31 11/13/2019   PSA 7.00 (H) 06/20/2019   INR 1.03 05/29/2009   HGBA1C 5.7 05/30/2020       Assessment & Plan:   Problem List Items Addressed This Visit    Anemia    Follow cbc.       Aortic atherosclerosis (HCC)    Continue crestor.        Hypercholesteremia    Continue crestor.  Low cholesterol diet and exercise.  Follow lipid panel and liver function tests.        Hyperglycemia    Low carb diet and exercise.  Follow met b and a1c.       Memory change    Mild cognitive impairment.  Saw neurology.  Started on aricept.  Follow.       Mild depression ([place])    Reports increased stress and mild depression as outlined.  Discussed medication.  He feels he needs something to help level things out.  Will d/w psych given other medications.  He is agreeable.  Follow.       Prostate cancer [facility])    Being followed by oncology.  Elected active surveillance.  Planning for MRI prostate 10/02/20.        Skin lesions    Skin lesions - request referral to see dermatology.  bactroban to scalp lesion that he scratched off.        Relevant Orders   Ambulatory referral to Dermatology   Sleep difficulties  Remains on seroquel.           Einar Pheasant, MD

## 2020-10-01 ENCOUNTER — Other Ambulatory Visit: Payer: Self-pay | Admitting: Internal Medicine

## 2020-10-02 DIAGNOSIS — C61 Malignant neoplasm of prostate: Secondary | ICD-10-CM | POA: Diagnosis not present

## 2020-10-03 ENCOUNTER — Encounter: Payer: Self-pay | Admitting: Internal Medicine

## 2020-10-03 ENCOUNTER — Telehealth: Payer: Self-pay

## 2020-10-03 NOTE — Telephone Encounter (Signed)
Pt came in and gave an order from [facility] for PSA. He needs this scheduled before 11/11 and would like to go ahead and get it scheduled. Placed order in folder up front. Please call after orders are placed.

## 2020-10-05 ENCOUNTER — Other Ambulatory Visit: Payer: Self-pay

## 2020-10-05 ENCOUNTER — Encounter: Payer: Self-pay | Admitting: Internal Medicine

## 2020-10-05 ENCOUNTER — Ambulatory Visit
Admission: RE | Admit: 2020-10-05 | Discharge: 2020-10-05 | Disposition: A | Discharge status: Home / Self Care | Payer: Medicare Other | Source: Ambulatory Visit | Attending: Neurosurgery | Admitting: Neurosurgery

## 2020-10-05 DIAGNOSIS — M545 Low back pain, unspecified: Secondary | ICD-10-CM | POA: Diagnosis not present

## 2020-10-05 DIAGNOSIS — L989 Disorder of the skin and subcutaneous tissue, unspecified: Secondary | ICD-10-CM | POA: Insufficient documentation

## 2020-10-05 DIAGNOSIS — F32A Depression, unspecified: Secondary | ICD-10-CM | POA: Insufficient documentation

## 2020-10-05 DIAGNOSIS — F320 Major depressive disorder, single episode, mild: Secondary | ICD-10-CM | POA: Insufficient documentation

## 2020-10-05 NOTE — Assessment & Plan Note (Signed)
Reports increased stress and mild depression as outlined.  Discussed medication.  He feels he needs something to help level things out.  Will d/w psych given other medications.  He is agreeable.  Follow.

## 2020-10-05 NOTE — Assessment & Plan Note (Signed)
Mild cognitive impairment.  Saw neurology.  Started on aricept.  Follow.  

## 2020-10-05 NOTE — Assessment & Plan Note (Signed)
Remains on seroquel.

## 2020-10-05 NOTE — Assessment & Plan Note (Signed)
Skin lesions - request referral to see dermatology.  bactroban to scalp lesion that he scratched off.

## 2020-10-05 NOTE — Assessment & Plan Note (Signed)
Being followed by oncology.  Elected active surveillance.  Planning for MRI prostate 10/02/20.

## 2020-10-05 NOTE — Assessment & Plan Note (Signed)
Follow cbc.  

## 2020-10-05 NOTE — Assessment & Plan Note (Signed)
Low carb diet and exercise.  Follow met b and a1c.

## 2020-10-05 NOTE — Assessment & Plan Note (Signed)
Continue crestor.  Low cholesterol diet and exercise. Follow lipid panel and liver function tests.   

## 2020-10-05 NOTE — Assessment & Plan Note (Signed)
Continue crestor 

## 2020-10-06 ENCOUNTER — Encounter: Payer: Self-pay | Admitting: Internal Medicine

## 2020-10-06 NOTE — Telephone Encounter (Signed)
Order has been placed. Patient has been scheduled in November prior to his appt with [facility].

## 2020-10-17 DIAGNOSIS — G8929 Other chronic pain: Secondary | ICD-10-CM | POA: Insufficient documentation

## 2020-10-17 DIAGNOSIS — M48061 Spinal stenosis, lumbar region without neurogenic claudication: Secondary | ICD-10-CM | POA: Diagnosis not present

## 2020-10-17 DIAGNOSIS — M47816 Spondylosis without myelopathy or radiculopathy, lumbar region: Secondary | ICD-10-CM | POA: Diagnosis not present

## 2020-10-17 DIAGNOSIS — M545 Low back pain, unspecified: Secondary | ICD-10-CM | POA: Diagnosis not present

## 2020-10-28 ENCOUNTER — Other Ambulatory Visit: Payer: Medicare Other

## 2020-10-28 DIAGNOSIS — G3184 Mild cognitive impairment, so stated: Secondary | ICD-10-CM | POA: Diagnosis not present

## 2020-10-29 ENCOUNTER — Encounter: Payer: Self-pay | Admitting: Counselor

## 2020-10-30 DIAGNOSIS — M47816 Spondylosis without myelopathy or radiculopathy, lumbar region: Secondary | ICD-10-CM | POA: Diagnosis not present

## 2020-11-04 ENCOUNTER — Ambulatory Visit: Payer: Medicare Other

## 2020-11-11 ENCOUNTER — Other Ambulatory Visit: Payer: Self-pay

## 2020-11-11 ENCOUNTER — Other Ambulatory Visit (INDEPENDENT_AMBULATORY_CARE_PROVIDER_SITE_OTHER): Payer: Medicare Other

## 2020-11-11 DIAGNOSIS — R739 Hyperglycemia, unspecified: Secondary | ICD-10-CM | POA: Diagnosis not present

## 2020-11-11 DIAGNOSIS — E7800 Pure hypercholesterolemia, unspecified: Secondary | ICD-10-CM

## 2020-11-11 DIAGNOSIS — C61 Malignant neoplasm of prostate: Secondary | ICD-10-CM

## 2020-11-11 LAB — BASIC METABOLIC PANEL
BUN: 18 mg/dL (ref 6–23)
CO2: 28 mEq/L (ref 19–32)
Calcium: 9.5 mg/dL (ref 8.4–10.5)
Chloride: 104 mEq/L (ref 96–112)
Creatinine, Ser: 0.82 mg/dL (ref 0.40–1.50)
GFR: 89.04 mL/min (ref >60.00)
Glucose, Bld: 94 mg/dL (ref 70–99)
Potassium: 4.1 mEq/L (ref 3.5–5.1)
Sodium: 140 mEq/L (ref 135–145)

## 2020-11-11 LAB — LIPID PANEL
Cholesterol: 186 mg/dL (ref 0–200)
HDL: 78.7 mg/dL (ref >39.00)
LDL Cholesterol: 93 mg/dL (ref 0–99)
NonHDL: 106.94
Total CHOL/HDL Ratio: 2
Triglycerides: 70 mg/dL (ref 0.0–149.0)
VLDL: 14 mg/dL (ref 0.0–40.0)

## 2020-11-11 LAB — HEPATIC FUNCTION PANEL
ALT: 12 U/L (ref 0–53)
AST: 17 U/L (ref 0–37)
Albumin: 4.3 g/dL (ref 3.5–5.2)
Alkaline Phosphatase: 88 U/L (ref 39–117)
Bilirubin, Direct: 0.1 mg/dL (ref 0.0–0.3)
Total Bilirubin: 0.6 mg/dL (ref 0.2–1.2)
Total Protein: 6.4 g/dL (ref 6.0–8.3)

## 2020-11-11 LAB — HEMOGLOBIN A1C: Hgb A1c MFr Bld: 5.8 % (ref 4.6–6.5)

## 2020-11-12 ENCOUNTER — Encounter: Payer: Self-pay | Admitting: Internal Medicine

## 2020-11-12 DIAGNOSIS — M47812 Spondylosis without myelopathy or radiculopathy, cervical region: Secondary | ICD-10-CM | POA: Diagnosis not present

## 2020-11-12 DIAGNOSIS — Z79891 Long term (current) use of opiate analgesic: Secondary | ICD-10-CM | POA: Diagnosis not present

## 2020-11-12 DIAGNOSIS — G894 Chronic pain syndrome: Secondary | ICD-10-CM | POA: Diagnosis not present

## 2020-11-12 DIAGNOSIS — M4802 Spinal stenosis, cervical region: Secondary | ICD-10-CM | POA: Diagnosis not present

## 2020-11-13 DIAGNOSIS — M47816 Spondylosis without myelopathy or radiculopathy, lumbar region: Secondary | ICD-10-CM | POA: Diagnosis not present

## 2020-11-17 DIAGNOSIS — M461 Sacroiliitis, not elsewhere classified: Secondary | ICD-10-CM | POA: Diagnosis not present

## 2020-11-17 DIAGNOSIS — M48061 Spinal stenosis, lumbar region without neurogenic claudication: Secondary | ICD-10-CM | POA: Diagnosis not present

## 2020-11-17 DIAGNOSIS — M47816 Spondylosis without myelopathy or radiculopathy, lumbar region: Secondary | ICD-10-CM | POA: Diagnosis not present

## 2020-11-17 DIAGNOSIS — M545 Low back pain, unspecified: Secondary | ICD-10-CM | POA: Diagnosis not present

## 2020-11-18 ENCOUNTER — Telehealth: Payer: Self-pay

## 2020-11-18 ENCOUNTER — Ambulatory Visit (INDEPENDENT_AMBULATORY_CARE_PROVIDER_SITE_OTHER): Payer: Medicare Other

## 2020-11-18 VITALS — Ht 69.0 in | Wt 162.0 lb

## 2020-11-18 DIAGNOSIS — Z0000 Encounter for general adult medical examination without abnormal findings: Secondary | ICD-10-CM | POA: Diagnosis not present

## 2020-11-18 NOTE — Telephone Encounter (Signed)
Patient notes difficulty sleeping worsening over the last 2 weeks. No new changes except starting flomax. Plans to add melatonin to daily regimen and follow up if not improved. Requests suggestions from pcp if any at this time.

## 2020-11-18 NOTE — Progress Notes (Signed)
Subjective:   Mike Farley is a 70 y.o. male who presents for Medicare Annual/Subsequent preventive examination.  Review of Systems    No ROS.  Medicare Wellness Virtual Visit.  Visual/audio telehealth visit, UTA vital signs.   See social history for additional risk factors.   Cardiac Risk Factors include: advanced age (>70men, >32 women);male gender;hypertension     Objective:    Today's Vitals   11/18/20 1124  Weight: 162 lb (73.5 kg)  Height: 5\' 9"  (1.753 m)   Body mass index is 23.92 kg/m.  Advanced Directives 11/18/2020 07/04/2020 06/30/2020 04/22/2020 11/02/2019 03/15/2019 03/08/2019  Does Patient Have a Medical Advance Directive? Yes No No No Yes No No  Type of Paramedic of [place];Living will - - - [place];Living will - -  Does patient want to make changes to medical advance directive? No - Patient declined - - - No - Patient declined - -  Copy of [place] in Chart? No - copy requested - - - No - copy requested - -  Would patient like information on creating a medical advance directive? - No - Patient declined No - Patient declined No - Patient declined - No - Patient declined No - Patient declined    Current Medications (verified) Outpatient Encounter Medications as of 11/18/2020  Medication Sig   cholecalciferol (VITAMIN D3) 25 MCG (1000 UT) tablet Take 1,000 Units by mouth 3 (three) times a week.    cyanocobalamin 1000 MCG tablet Take 1,000 mcg by mouth daily.    donepezil (ARICEPT) 5 MG tablet Take 5 mg by mouth daily.   Melatonin 10 MG TABS Take 30 mg by mouth at bedtime as needed (sleep).   Multiple Vitamin (MULTIVITAMIN) tablet Take 1 tablet by mouth daily. ABC Complete MEN   mupirocin ointment (BACTROBAN) 2 % Apply 1 application topically 2 (two) times daily.   oxyCODONE (ROXICODONE) 15 MG immediate release tablet Take 15 mg by mouth 4 (four) times daily.   oxymetazoline (AFRIN) 0.05 % nasal  spray Place 1 spray into both nostrils at bedtime.   QUEtiapine (SEROQUEL) 300 MG tablet TAKE ONE TABLET AT BEDTIME   rosuvastatin (CRESTOR) 10 MG tablet TAKE 1 TABLET BY MOUTH DAILY (Patient taking differently: Take 10 mg by mouth daily.)   tamsulosin (FLOMAX) 0.4 MG CAPS capsule Take 0.4 mg by mouth daily.   valACYclovir (VALTREX) 500 MG tablet Take 1 tablet (500 mg total) by mouth 2 (two) times daily. (Patient taking differently: Take 500 mg by mouth daily as needed (Cold sores).)   No facility-administered encounter medications on file as of 11/18/2020.    Allergies (verified) Morphine and Amoxicillin   History: Past Medical History:  Diagnosis Date   Anxiety    Arthritis    shoulder   Cancer ([place])    prostate ca - Md just watching   Cervical pain (neck)    limited mobility   Chronic pain    Depression    years ago (minor)   HLD (hyperlipidemia)    Hx of cold sores    Mild cognitive impairment with memory loss    Past Surgical History:  Procedure Laterality Date   ANTERIOR FUSION CERVICAL SPINE  2011   4-5 levels   APPENDECTOMY     BONE EXCISION Left 09/28/2017   Procedure: BONE EXCISION-TAILORS  EXOSTECTOMY;  Surgeon: Samara Deist, DPM;  Location: [facility];  Service: Podiatry;  Laterality: Left;  IVA LOCAL   CERVICAL  DISCECTOMY  2008   C2-C7 Fused    CHOLECYSTECTOMY     COLONOSCOPY  2020   ELBOW FRACTURE SURGERY Left 2004   METATARSAL HEAD EXCISION Left 07/04/2020   Procedure: METATARSAL HEAD EXCISION;  Surgeon: Samara Deist, DPM;  Location: [facility];  Service: Podiatry;  Laterality: Left;   PROSTATE BIOPSY N/A 03/15/2019   Procedure: PROSTATE BIOPSY [facility];  Surgeon: Royston Cowper, MD;  Location: [facility];  Service: Urology;  Laterality: N/A;   RADIOLOGY WITH ANESTHESIA N/A 04/22/2020   Procedure: MRI WITH ANESTHESIA BRAIN WITHOUT CONTRAST;  Surgeon: Radiologist, Medication, MD;  Location: [facility];  Service: Radiology;  Laterality: N/A;   SHOULDER  SURGERY Right    TONSILLECTOMY AND ADENOIDECTOMY     TOTAL KNEE ARTHROPLASTY Right 2005   x 4   Family History  Problem Relation Age of Onset   Arthritis Mother    Cancer Mother        colon & breast cnacer   Mental illness Mother        Depression & bipolar   Cancer Father        lung & prostate cancer   Alcohol abuse Brother    Kidney disease Brother    Social History   Socioeconomic History   Marital status: Married    Spouse name: Not on file   Number of children: Not on file   Years of education: Not on file   Highest education level: Not on file  Occupational History   Not on file  Tobacco Use   Smoking status: Former    Pack years: 0.00   Smokeless tobacco: Current    Types: Chew   Tobacco comments:    Quit yrs ago  Vaping Use   Vaping Use: Never used  Substance and Sexual Activity   Alcohol use: Yes    Alcohol/week: 7.0 standard drinks    Types: 7 Standard drinks or equivalent per week    Comment: beer/wine   Drug use: Never   Sexual activity: Not Currently  Other Topics Concern   Not on file  Social History Narrative   Not on file   Social Determinants of Health   Financial Resource Strain: Low Risk    Difficulty of Paying Living Expenses: Not hard at all  Food Insecurity: No Food Insecurity   Worried About Charity fundraiser in the Last Year: Never true   [place] in the Last Year: Never true  Transportation Needs: No Transportation Needs   Lack of Transportation (Medical): No   Lack of Transportation (Non-Medical): No  Physical Activity: Not on file  Stress: No Stress Concern Present   Feeling of Stress : Not at all  Social Connections: Unknown   Frequency of Communication with Friends and Family: Not on file   Frequency of Social Gatherings with Friends and Family: Not on file   Attends Religious Services: Not on file   Active Member of Clubs or Organizations: Not on file   Attends Archivist Meetings: Not on file    Marital Status: Married    Tobacco Counseling Ready to quit: Not Answered Counseling given: Not Answered Tobacco comments: Quit yrs ago   Clinical Intake:  Pre-visit preparation completed: Yes        Diabetes: No  How often do you need to have someone help you when you read instructions, pamphlets, or other written materials from your doctor or pharmacy?: 1 - Never    Interpreter Needed?:  No      Activities of Daily Living In your present state of health, do you have any difficulty performing the following activities: 11/18/2020 06/30/2020  Hearing? N -  Vision? N -  Difficulty concentrating or making decisions? N -  Walking or climbing stairs? Y N  Comment Chronic back pain. Paces self. -  Dressing or bathing? N -  Doing errands, shopping? N N  Preparing Food and eating ? N -  Using the Toilet? N -  In the past six months, have you accidently leaked urine? N -  Do you have problems with loss of bowel control? N -  Managing your Medications? N -  Managing your Finances? N -  Housekeeping or managing your Housekeeping? N -  Some recent data might be hidden    Patient Care Team: Einar Pheasant, MD as PCP - General (Internal Medicine)  Indicate any recent Medical Services you may have received from other than [facility] providers in the past year (date may be approximate).     Assessment:   This is a routine wellness examination for Mike Farley.  I connected with Mike Farley today by telephone and verified that I am speaking with the correct person using two identifiers. Location patient: home Location provider: work Persons participating in the virtual visit: patient, Marine scientist.    I discussed the limitations, risks, security and privacy concerns of performing an evaluation and management service by telephone and the availability of in person appointments. The patient expressed understanding and verbally consented to this telephonic visit.    Interactive audio and video  telecommunications were attempted between this provider and patient, however failed, due to patient having technical difficulties OR patient did not have access to video capability.  We continued and completed visit with audio only.  Some vital signs may be absent or patient reported.   Hearing/Vision screen Hearing Screening - Comments:: Patient is able to hear conversational tones without difficulty.  No issues reported.   Vision Screening - Comments:: Followed by Dr. Ellin Mayhew Visual acuity not assessed, virtual visit.  They have seen their ophthalmologist in the last 12 months.    Dietary issues and exercise activities discussed: Current Exercise Habits: Home exercise routine, Type of exercise: walking, Intensity: Mild Healthy diet Good water intake  Goals Addressed               This Visit's Progress     Patient Stated     Maintain Healthy Lifestyle (pt-stated)        Stay active Healthy diet Sleep well        Depression Screen PHQ 2/9 Scores 11/18/2020 11/02/2019 01/24/2019 10/10/2018 07/10/2018 10/06/2017 02/25/2017  PHQ - 2 Score 0 0 0 0 0 0 0  PHQ- 9 Score - - - 2 - - -    Fall Risk Fall Risk  11/18/2020 09/29/2020 11/02/2019 01/24/2019 07/10/2018  Falls in the past year? 0 0 1 0 0  Number falls in past yr: 0 0 0 - -  Injury with Fall? 0 0 0 - -  Comment - - Missed a step when walking, no injury. - -  Risk for fall due to : - - - Impaired vision -  Follow up Falls evaluation completed Falls evaluation completed Falls evaluation completed Falls evaluation completed -    FALL RISK PREVENTION PERTAINING TO THE HOME: Handrails in use when climbing stairs? Yes Home free of loose throw rugs in walkways, pet beds, electrical cords, etc? Yes  Adequate lighting in your  home to reduce risk of falls? Yes   ASSISTIVE DEVICES UTILIZED TO PREVENT FALLS: Use of a cane, walker or w/c? No   TIMED UP AND GO: Was the test performed? No .   Cognitive Function: Patient is alert and  oriented x3.  Enjoys brain health word games. Reading. Followed by Neurology, Dr. Manuella Ghazi. MMSE - Mini Mental State Exam 10/06/2017  Orientation to time 5  Orientation to Place 5  Registration 3  Attention/ Calculation 5  Recall 3  Language- name 2 objects 2  Language- repeat 1  Language- follow 3 step command 3  Language- read & follow direction 1  Write a sentence 1  Copy design 1  Total score 30     6CIT Screen 11/02/2019  What Year? 0 points  What month? 0 points  Months in reverse 0 points  Repeat phrase 0 points    Immunizations Immunization History  Administered Date(s) Administered   Influenza, High Dose Seasonal PF 03/12/2020   Influenza-Unspecified 05/04/2017, 03/13/2018, 02/19/2019   PFIZER Comirnaty(Gray Top)Covid-19 Tri-Sucrose Vaccine 09/23/2020   PFIZER(Purple Top)SARS-COV-2 Vaccination 05/28/2019, 06/18/2019, 03/29/2020   Pneumococcal Conjugate-13 06/27/2015   Pneumococcal Polysaccharide-23 06/24/2017   Tdap 10/21/2015, 10/07/2020   Zoster Recombinat (Shingrix) 05/08/2019, 09/04/2019    Health Maintenance  There are no preventive care reminders to display for this patient. Health Maintenance  Topic Date Due   INFLUENZA VACCINE  12/22/2020   COVID-19 Vaccine (5 - Booster for Pfizer series) 01/24/2021   COLONOSCOPY (Pts 45-68yrs Insurance coverage will need to be confirmed)  06/24/2027   TETANUS/TDAP  10/08/2030   Hepatitis C Screening  Completed   PNA vac Low Risk Adult  Completed   Zoster Vaccines- Shingrix  Completed   HPV VACCINES  Aged Out   Vision Screening: Recommended annual ophthalmology exams for early detection of glaucoma and other disorders of the eye. Is the patient up to date with their annual eye exam?  Yes    Dental Screening: Recommended annual dental exams for proper oral hygiene  Community Resource Referral / Chronic Care Management: CRR required this visit?  No   CCM required this visit?  No      Plan:   Keep all routine  maintenance appointments.   I have personally reviewed and noted the following in the patient's chart:   Medical and social history Use of alcohol, tobacco or illicit drugs  Current medications and supplements including opioid prescriptions. Patient is currently taking opioid prescriptions. Information provided to patient regarding non-opioid alternatives. Patient advised to discuss non-opioid treatment plan with their provider. Followed by pain clinic. Care plan in place. Functional ability and status Nutritional status Physical activity Advanced directives List of other physicians Hospitalizations, surgeries, and ER visits in previous 12 months Vitals Screenings to include cognitive, depression, and falls Referrals and appointments  In addition, I have reviewed and discussed with patient certain preventive protocols, quality metrics, and best practice recommendations. A written personalized care plan for preventive services as well as general preventive health recommendations were provided to patient via mychart.     Varney Biles, LPN   07/19/3333

## 2020-11-18 NOTE — Patient Instructions (Addendum)
Mike Farley , Thank you for taking time to come for your Medicare Wellness Visit. I appreciate your ongoing commitment to your health goals. Please review the following plan we discussed and let me know if I can assist you in the future.   These are the goals we discussed:  Goals       Patient Stated     Maintain Healthy Lifestyle (pt-stated)      Stay active Healthy diet Sleep well         This is a list of the screening recommended for you and due dates:  Health Maintenance  Topic Date Due   Flu Shot  12/22/2020   COVID-19 Vaccine (5 - Booster for Pfizer series) 01/24/2021   Colon Cancer Screening  06/24/2027   Tetanus Vaccine  10/08/2030   Hepatitis C Screening: USPSTF Recommendation to screen - Ages 18-79 yo.  Completed   Pneumonia vaccines  Completed   Zoster (Shingles) Vaccine  Completed   HPV Vaccine  Aged Out     Advanced directives: End of life planning; Advance aging; Advanced directives discussed.  Copy of current HCPOA/Living Will requested.    Conditions/risks identified: not sleeping well. Note sent to PCP.  Follow up in one year for your annual wellness visit.   Preventive Care 31 Years and Older, Male Preventive care refers to lifestyle choices and visits with your health care provider that can promote health and wellness. What does preventive care include? A yearly physical exam. This is also called an annual well check. Dental exams once or twice a year. Routine eye exams. Ask your health care provider how often you should have your eyes checked. Personal lifestyle choices, including: Daily care of your teeth and gums. Regular physical activity. Eating a healthy diet. Avoiding tobacco and drug use. Limiting alcohol use. Practicing safe sex. Taking low doses of aspirin every day. Taking vitamin and mineral supplements as recommended by your health care provider. What happens during an annual well check? The services and screenings done by your  health care provider during your annual well check will depend on your age, overall health, lifestyle risk factors, and family history of disease. Counseling  Your health care provider may ask you questions about your: Alcohol use. Tobacco use. Drug use. Emotional well-being. Home and relationship well-being. Sexual activity. Eating habits. History of falls. Memory and ability to understand (cognition). Work and work Statistician. Screening  You may have the following tests or measurements: Height, weight, and BMI. Blood pressure. Lipid and cholesterol levels. These may be checked every 5 years, or more frequently if you are over 2 years old. Skin check. Lung cancer screening. You may have this screening every year starting at age 36 if you have a 30-pack-year history of smoking and currently smoke or have quit within the past 15 years. Fecal occult blood test (FOBT) of the stool. You may have this test every year starting at age 51. Flexible sigmoidoscopy or colonoscopy. You may have a sigmoidoscopy every 5 years or a colonoscopy every 10 years starting at age 80. Prostate cancer screening. Recommendations will vary depending on your family history and other risks. Hepatitis C blood test. Hepatitis B blood test. Sexually transmitted disease (STD) testing. Diabetes screening. This is done by checking your blood sugar (glucose) after you have not eaten for a while (fasting). You may have this done every 1-3 years. Abdominal aortic aneurysm (AAA) screening. You may need this if you are a current or former smoker. Osteoporosis. You  may be screened starting at age 26 if you are at high risk. Talk with your health care provider about your test results, treatment options, and if necessary, the need for more tests. Vaccines  Your health care provider may recommend certain vaccines, such as: Influenza vaccine. This is recommended every year. Tetanus, diphtheria, and acellular pertussis  (Tdap, Td) vaccine. You may need a Td booster every 10 years. Zoster vaccine. You may need this after age 72. Pneumococcal 13-valent conjugate (PCV13) vaccine. One dose is recommended after age 90. Pneumococcal polysaccharide (PPSV23) vaccine. One dose is recommended after age 75. Talk to your health care provider about which screenings and vaccines you need and how often you need them. This information is not intended to replace advice given to you by your health care provider. Make sure you discuss any questions you have with your health care provider. Document Released: 06/06/2015 Document Revised: 01/28/2016 Document Reviewed: 03/11/2015 Elsevier Interactive Patient Education  2017 [place] Prevention in the Home Falls can cause injuries. They can happen to people of all ages. There are many things you can do to make your home safe and to help prevent falls. What can I do on the outside of my home? Regularly fix the edges of walkways and driveways and fix any cracks. Remove anything that might make you trip as you walk through a door, such as a raised step or threshold. Trim any bushes or trees on the path to your home. Use bright outdoor lighting. Clear any walking paths of anything that might make someone trip, such as rocks or tools. Regularly check to see if handrails are loose or broken. Make sure that both sides of any steps have handrails. Any raised decks and porches should have guardrails on the edges. Have any leaves, snow, or ice cleared regularly. Use sand or salt on walking paths during winter. Clean up any spills in your garage right away. This includes oil or grease spills. What can I do in the bathroom? Use night lights. Install grab bars by the toilet and in the tub and shower. Do not use towel bars as grab bars. Use non-skid mats or decals in the tub or shower. If you need to sit down in the shower, use a plastic, non-slip stool. Keep the floor dry. Clean  up any water that spills on the floor as soon as it happens. Remove soap buildup in the tub or shower regularly. Attach bath mats securely with double-sided non-slip rug tape. Do not have throw rugs and other things on the floor that can make you trip. What can I do in the bedroom? Use night lights. Make sure that you have a light by your bed that is easy to reach. Do not use any sheets or blankets that are too big for your bed. They should not hang down onto the floor. Have a firm chair that has side arms. You can use this for support while you get dressed. Do not have throw rugs and other things on the floor that can make you trip. What can I do in the kitchen? Clean up any spills right away. Avoid walking on wet floors. Keep items that you use a lot in easy-to-reach places. If you need to reach something above you, use a strong step stool that has a grab bar. Keep electrical cords out of the way. Do not use floor polish or wax that makes floors slippery. If you must use wax, use non-skid floor wax. Do  not have throw rugs and other things on the floor that can make you trip. What can I do with my stairs? Do not leave any items on the stairs. Make sure that there are handrails on both sides of the stairs and use them. Fix handrails that are broken or loose. Make sure that handrails are as long as the stairways. Check any carpeting to make sure that it is firmly attached to the stairs. Fix any carpet that is loose or worn. Avoid having throw rugs at the top or bottom of the stairs. If you do have throw rugs, attach them to the floor with carpet tape. Make sure that you have a light switch at the top of the stairs and the bottom of the stairs. If you do not have them, ask someone to add them for you. What else can I do to help prevent falls? Wear shoes that: Do not have high heels. Have rubber bottoms. Are comfortable and fit you well. Are closed at the toe. Do not wear sandals. If you  use a stepladder: Make sure that it is fully opened. Do not climb a closed stepladder. Make sure that both sides of the stepladder are locked into place. Ask someone to hold it for you, if possible. Clearly mark and make sure that you can see: Any grab bars or handrails. First and last steps. Where the edge of each step is. Use tools that help you move around (mobility aids) if they are needed. These include: Canes. Walkers. Scooters. Crutches. Turn on the lights when you go into a dark area. Replace any light bulbs as soon as they burn out. Set up your furniture so you have a clear path. Avoid moving your furniture around. If any of your floors are uneven, fix them. If there are any pets around you, be aware of where they are. Review your medicines with your doctor. Some medicines can make you feel dizzy. This can increase your chance of falling. Ask your doctor what other things that you can do to help prevent falls. This information is not intended to replace advice given to you by your health care provider. Make sure you discuss any questions you have with your health care provider. Document Released: 03/06/2009 Document Revised: 10/16/2015 Document Reviewed: 06/14/2014 Elsevier Interactive Patient Education  2017 [place].   Opioid Pain Medicine Management Opioid pain medicines are strong medicines that are used to treat bad or very bad pain. When you take them for a short time, they can help you: Sleep better. Do better in physical therapy. Feel better during the first few days after you get hurt. Recover from surgery. Only take these medicines if a doctor says that you can. You should only take them for a short time. This is because opioids can be hard to stop taking (they are addictive). The longer you take opioids, the harder it may be to stop taking them (opioid use disorder). What are the risks? Opioids can cause problems (side effects). Taking them for more than 3 days  raises your chance of problems, such as: Trouble pooping (constipation). Feeling sick to your stomach (nausea). Vomiting. Feeling very sleepy. Confusion. Not being able to stop taking the medicine. Breathing problems. Taking opioids for a long time can make it hard for you to do daily tasks. It can also put you at risk for: Car accidents. Depression. Suicide. Heart attack. Taking too much of the medicine (overdose), which can sometimes lead to death. What is a  pain treatment plan? A pain treatment plan is a plan made by you and your doctor. Work with your doctor to make a plan for treating your pain. To help you do this: Talk about the goals of your treatment, including: How much pain you might expect to have. How you will manage the pain. Talk about the risks and benefits of taking these medicines for your condition. Remember that a good treatment plan uses more than one approach and lowers the risks of side effects. Tell your doctor about the amount of medicines you take and about any drug or alcohol use. Get your pain medicine prescriptions from only one doctor. Pain can be managed with other treatments. Work with your doctor to find other ways to help your pain, such as: Physical therapy. Counseling. Eating healthy foods. Brain exercises. Massage. Meditation. Other pain medicines. Doing gentle exercises. Tapering your use of opioids If you have been taking opioids for more than a few weeks, you may need to slowly decrease (taper) how much you take until you stop taking them. Doing this can lower your chance of having symptoms, such as: Pain and cramping in your belly (abdomen). Feeling sick to your stomach. Sweating. Feeling very sleepy. Feeling restless. Shaking you cannot control (tremors). Cravings for the medicine. Do not try to stop taking them by yourself. Work with your doctor to stop. Your doctorwill help you take less until you are not taking the medicine at  all. Follow these instructions at home: Safety and storage  While you are taking opioids: Do not drive. Do not use machines or power tools. Do not sign important papers (legal documents). Do not drink alcohol. Do not take sleeping pills. Do not take care of children by yourself. Do not do activities where you need to climb or be in high places, like working on a ladder. Do not go into any water, such as a lake, river, ocean, swimming pool, or hot tub. Keep your opioids locked up or in a place where children cannot reach them. Do not share your pain medicine with anyone.  Getting rid of leftover pills Do not save any leftover pills. Get rid of leftover pills safely by: Taking them to a take-back program in your area. Bringing them to a pharmacy that has a container for throwing away pills (pill disposal). Flushing them down the toilet. Check the label or package insert of your medicine to see whether this is safe to do. Throwing them in the trash. Check the label or package insert of your medicine to see whether this is safe to do. If it is safe to throw them out: Take the pills out of their container. Put the pills into a container you can seal. Mix the pills with used coffee grounds, food scraps, dirt, or cat litter. Put this in the trash. Activity Return to your normal activities as told by your doctor. Ask your doctor what activities are safe for you. Avoid doing things that make your pain worse. Do exercises as told by your doctor. General instructions You may need to take these actions to prevent or treat trouble pooping: Drink enough fluid to keep your pee (urine) pale yellow. Take over-the-counter or prescription medicines. Eat foods that are high in fiber. These include beans, whole grains, and fresh fruits and vegetables. Limit foods that are high in fat and sugar. These include fried or sweet foods. Keep all follow-up visits as told by your doctor. This is  important. Where to find support  If you have been taking opioids for a long time, think about getting helpquitting from a local support group or counselor. Ask your doctor about this. Where to find more information Centers for Disease Control and Prevention (CDC): http://www.wolf.info/ U.S. Food and Drug Administration (FDA): [place] Get help right away if: Seek medical care right away if you are taking opioids and you, or people close to you, notice any of the following: You have trouble breathing. Your breathing is slower or more shallow than normal. You have a very slow heartbeat. You feel very confused. You pass out (faint). You are very sleepy. Your speech is not normal. You feel sick to your stomach and vomit. You have cold skin. You have blue lips or fingernails. Your muscles are weak (limp) and your body seems floppy. The black centers of your eyes (pupils) are smaller than normal. If you think that you or someone else may have taken too much of an opioid medicine, get medical help right away. Call your local emergency services (911 in the [place] Do not drive yourself to the hospital. If you ever feel like you may hurt yourself or others, or have thoughts about taking your own life, get help right away. You can go to your nearest emergency department or call: Your local emergency services (911 in the [place] The hotline of the [facility] (239)521-5040 in the [place] A suicide crisis helpline, such as the [place] at (469)670-1913. This is open 24 hours a day. Summary Opioid are strong medicines that are used to treat bad or very bad pain. A pain treatment plan is a plan made by you and your doctor. Work with your doctor to make a plan for treating your pain. Work with your doctor to find other ways to help your pain. If you think that you or someone else may have taken too much of an opioid, get help right away. This information is not  intended to replace advice given to you by your health care provider. Make sure you discuss any questions you have with your healthcare provider. Document Revised: 05/02/2020 Document Reviewed: 06/09/2018 Elsevier Patient Education  [place].

## 2020-11-19 NOTE — Telephone Encounter (Signed)
Patient is aware of below. He is going to try the melatonin. He has been under some increased stress. He is going to continue to monitor and let me know if he feels that referral is needed.

## 2020-11-19 NOTE — Telephone Encounter (Signed)
Agree with melatonin.  Already on seroquel q hs.  If having increased issues (given already on listed medication), can refer to psychiatry - to help with sleep issues.  Let me know if he feels needs referral.

## 2020-12-09 ENCOUNTER — Ambulatory Visit: Payer: Medicare Other | Admitting: Internal Medicine

## 2020-12-18 ENCOUNTER — Encounter: Payer: Self-pay | Admitting: Internal Medicine

## 2020-12-18 ENCOUNTER — Other Ambulatory Visit: Payer: Self-pay

## 2020-12-18 ENCOUNTER — Telehealth (INDEPENDENT_AMBULATORY_CARE_PROVIDER_SITE_OTHER): Payer: Medicare Other | Admitting: Internal Medicine

## 2020-12-18 VITALS — BP 124/79 | Ht 69.0 in | Wt 160.0 lb

## 2020-12-18 DIAGNOSIS — G4700 Insomnia, unspecified: Secondary | ICD-10-CM | POA: Diagnosis not present

## 2020-12-18 DIAGNOSIS — Z20822 Contact with and (suspected) exposure to covid-19: Secondary | ICD-10-CM | POA: Diagnosis not present

## 2020-12-18 DIAGNOSIS — C61 Malignant neoplasm of prostate: Secondary | ICD-10-CM | POA: Diagnosis not present

## 2020-12-18 DIAGNOSIS — G3184 Mild cognitive impairment, so stated: Secondary | ICD-10-CM

## 2020-12-18 NOTE — Progress Notes (Signed)
Telephone Note  I connected Mike Farley  on 12/18/20 at  2:00 PM EDT by Mike Farley and verified that I am speaking with the correct person using two identifiers.  Location patient: home, [place] Location provider:work or home office Persons participating in the virtual visit: patient, provider, wife Bethena Roys  I discussed the limitations of evaluation and management by telemedicine and the availability of in person appointments. The patient expressed understanding and agreed to proceed.   HPI: 1.insomnia trouble falling and staying tried melatonin otc w/o help does not want rx sleep medication on seroquel 300 mg qd  2. Mild cog impairment est Dr. Manuella Ghazi he canceled neuropsych testing due to wife and he felt like memory doing better and aricept 5 mg qd is helping  3. Wife tested + covid 12/06/20 he took 2 home test and not having sx's  4. Prostate cancer f/u psa 6.69 09/04/20 Dr. Izola Price [facility] seen 10/02/20 and f/u sch 04/09/21     ROS: See pertinent positives and negatives per HPI.  Past Medical History:  Diagnosis Date   Anxiety    Arthritis    shoulder   Cancer ([place])    prostate ca - Md just watching   Cervical pain (neck)    limited mobility   Chronic pain    Depression    years ago (minor)   HLD (hyperlipidemia)    Hx of cold sores    Mild cognitive impairment with memory loss     Past Surgical History:  Procedure Laterality Date   ANTERIOR FUSION CERVICAL SPINE  2011   4-5 levels   APPENDECTOMY     BONE EXCISION Left 09/28/2017   Procedure: BONE EXCISION-TAILORS  EXOSTECTOMY;  Surgeon: Samara Deist, DPM;  Location: [facility];  Service: Podiatry;  Laterality: Left;  IVA LOCAL   CERVICAL DISCECTOMY  2008   C2-C7 Fused    CHOLECYSTECTOMY     COLONOSCOPY  2020   ELBOW FRACTURE SURGERY Left 2004   METATARSAL HEAD EXCISION Left 07/04/2020   Procedure: METATARSAL HEAD EXCISION;  Surgeon: Samara Deist, DPM;  Location: [facility];  Service: Podiatry;  Laterality:  Left;   PROSTATE BIOPSY N/A 03/15/2019   Procedure: PROSTATE BIOPSY [facility];  Surgeon: Royston Cowper, MD;  Location: [facility];  Service: Urology;  Laterality: N/A;   RADIOLOGY WITH ANESTHESIA N/A 04/22/2020   Procedure: MRI WITH ANESTHESIA BRAIN WITHOUT CONTRAST;  Surgeon: Radiologist, Medication, MD;  Location: [facility];  Service: Radiology;  Laterality: N/A;   SHOULDER SURGERY Right    TONSILLECTOMY AND ADENOIDECTOMY     TOTAL KNEE ARTHROPLASTY Right 2005   x 4     Current Outpatient Medications:    cholecalciferol (VITAMIN D3) 25 MCG (1000 UT) tablet, Take 1,000 Units by mouth 3 (three) times a week. , Disp: , Rfl:    cyanocobalamin 1000 MCG tablet, Take 1,000 mcg by mouth daily. , Disp: , Rfl:    donepezil (ARICEPT) 5 MG tablet, Take 5 mg by mouth daily., Disp: , Rfl:    Melatonin 10 MG TABS, Take 30 mg by mouth at bedtime as needed (sleep)., Disp: , Rfl:    Multiple Vitamin (MULTIVITAMIN) tablet, Take 1 tablet by mouth daily. ABC Complete MEN, Disp: , Rfl:    oxyCODONE (ROXICODONE) 15 MG immediate release tablet, Take 15 mg by mouth 4 (four) times daily., Disp: , Rfl:    oxymetazoline (AFRIN) 0.05 % nasal spray, Place 1 spray into both nostrils at bedtime., Disp: , Rfl:  QUEtiapine (SEROQUEL) 300 MG tablet, TAKE ONE TABLET AT BEDTIME, Disp: 90 tablet, Rfl: 1   rosuvastatin (CRESTOR) 10 MG tablet, TAKE 1 TABLET BY MOUTH DAILY (Patient taking differently: Take 10 mg by mouth daily.), Disp: 30 tablet, Rfl: 5   tamsulosin (FLOMAX) 0.4 MG CAPS capsule, Take 0.4 mg by mouth daily., Disp: , Rfl:    mupirocin ointment (BACTROBAN) 2 %, Apply 1 application topically 2 (two) times daily., Disp: 22 g, Rfl: 0   valACYclovir (VALTREX) 500 MG tablet, Take 1 tablet (500 mg total) by mouth 2 (two) times daily. (Patient taking differently: Take 500 mg by mouth daily as needed (Cold sores).), Disp: 20 tablet, Rfl: 0  EXAM:  VITALS per patient if applicable:  GENERAL: alert, oriented, appears well  and in no acute distress  PSYCH/NEURO: pleasant and cooperative, no obvious depression or anxiety, speech and thought processing grossly intact  ASSESSMENT AND PLAN:  Discussed the following assessment and plan:  Mild cognitive impairment Cancelled neuropsych testing Dr. Manuella Ghazi   Prostate cancer [facility]) F/u Dr. Izola Price [facility] 04/09/21   Insomnia, unspecified type Disc melatonin 10 mg qhs and l theanine 100-200 mg qhs nature made  Already on seroquel 300 mg qd  Declines Rx for sleep disc meditation as well   Exposure to COVID-19 virus No sx's call back wife had paxlovid and tolerated will monitor for sx's   -we discussed possible serious and likely etiologies, options for evaluation and workup, limitations of telemedicine visit vs in person visit, treatment, treatment risks and precautions. Pt prefers to treat via telemedicine empirically rather than in person at this moment.   I discussed the assessment and treatment plan with the patient. The patient was provided an opportunity to ask questions and all were answered. The patient agreed with the plan and demonstrated an understanding of the instructions.    Time spent 10 minutes  Delorise Jackson, MD

## 2020-12-18 NOTE — Patient Instructions (Addendum)
L theanine 100-200 mg at night with Melatonin 10 mg at night  Petra K[employer] made brand   Zolpidem Tablets What is this medication? ZOLPIDEM (zole PI dem) treats insomnia. It helps you get to sleep faster andstay asleep throughout the night. It is often used for a short period of time. This medicine may be used for other purposes; ask your health care provider orpharmacist if you have questions. COMMON BRAND NAME(S): Ambien What should I tell my care team before I take this medication? They need to know if you have any of these conditions: Depression History of drug abuse or addiction If you often drink alcohol Liver disease Lung or breathing disease Myasthenia gravis Sleep apnea Sleep-walking, driving, eating or other activity while not fully awake after taking a sleep medication Suicidal thoughts, plans, or attempt; a previous suicide attempt by you or a family member An unusual or allergic reaction to zolpidem, other medications, foods, dyes, or preservatives Pregnant or trying to get pregnant Breast-feeding How should I use this medication? Take this medication by mouth with a glass of water. Follow the directions on the prescription label. It is better to take this medication on an empty stomach and only when you are ready for bed. Do not take your medication more often than directed. If you have been taking this medication for several weeks and suddenly stop taking it, you may get unpleasant withdrawal symptoms. Your care team may want to gradually reduce the dose. Do not stop taking thismedication on your own. Always follow your care team's advice. A special MedGuide will be given to you by the pharmacist with eachprescription and refill. Be sure to read this information carefully each time. Talk to your care team regarding the use of this medication in children.Special care may be needed. Overdosage: If you think you have taken too much of this medicine contact apoison control center or  emergency room at once. NOTE: This medicine is only for you. Do not share this medicine with others. What if I miss a dose? This does not apply. This medication should only be taken immediately beforegoing to sleep. Do not take double or extra doses. What may interact with this medication? Alcohol Antihistamines for allergy, cough and cold Certain medications for anxiety or sleep Certain medications for depression, like amitriptyline, fluoxetine, sertraline Certain medications for fungal infections like ketoconazole and itraconazole Certain medications for seizures like phenobarbital, primidone Ciprofloxacin Dietary supplements for sleep, like valerian or kava kava General anesthetics like halothane, isoflurane, methoxyflurane, propofol Local anesthetics like lidocaine, pramoxine, tetracaine Medications that relax muscles for surgery Narcotic medications for pain Phenothiazines like chlorpromazine, mesoridazine, prochlorperazine, thioridazine Rifampin This list may not describe all possible interactions. Give your health care provider a list of all the medicines, herbs, non-prescription drugs, or dietary supplements you use. Also tell them if you smoke, drink alcohol, or use illegaldrugs. Some items may interact with your medicine. What should I watch for while using this medication? Visit your care team for regular checks on your progress. Keep a regular sleep schedule by going to bed at about the same time each night. Avoid caffeine-containing drinks in the evening hours. When sleep medications are used every night for more than a few weeks, they may stop working. Talk to yourcare team if you still have trouble sleeping. After taking this medication, you may get up out of bed and do an activity that you do not know you are doing. The next morning, you may have no memory of this. Activities  include driving a car ("sleep-driving"), making and eating food, talking on the phone, sexual activity,  and sleep-walking. Serious injuries have occurred. Stop the medication and call your care team right away if you find out you have done any of these activities. Do not take this medication if you have used alcohol that evening. Do not take it if you have taken another medication for sleep. The risk of doing these sleep-relatedactivities is higher. Wait for at least 8 hours after you take a dose before driving or doing other activities that require full mental alertness. Do not take this medication unless you are able to stay in bed for a full night (7 to 8 hours) before you must be active again. You may have a decrease in mental alertness the day after use, even if you feel that you are fully awake. Tell your care team if you will need to perform activities requiring full alertness, such as driving, the next day. Do not stand or sit up quickly after taking this medication, especially ifyou are an older patient. This reduces the risk of dizzy or fainting spells. If you or your family notice any changes in your behavior, such as new or worsening depression, thoughts of harming yourself, anxiety, other unusual ordisturbing thoughts, or memory loss, call your care team right away. After you stop taking this medication, you may have trouble falling asleep. This is called rebound insomnia. This problem usually goes away on its ownafter 1 or 2 nights. What side effects may I notice from receiving this medication? Side effects that you should report to your care team as soon as possible: Allergic reactions-skin rash, itching, hives, swelling of the face, lips, tongue, or throat Change in vision such as blurry vision, seeing halos around lights, vision loss CNS depression-slow or shallow breathing, shortness of breath, feeling faint, dizziness, confusion, difficulty staying awake Mood and behavior changes-anxiety, nervousness, confusion, hallucinations, irritability, hostility, thoughts of suicide or self-harm,  worsening mood, feelings of depression Unusual sleep behaviors or activities you do not remember such as driving, eating, or sexual activity Side effects that usually do not require medical attention (report to your careteam if they continue or are bothersome): Diarrhea Dizziness Drowsiness the day after use Headache This list may not describe all possible side effects. Call your doctor for medical advice about side effects. You may report side effects to FDA at1-800-FDA-1088. Where should I keep my medication? Keep out of the reach of children and pets. This medication can be abused. Keep your medication in a safe place to protect it from theft. Do not share this medication with anyone. Selling or giving away this medication is dangerous andagainst the law. Store at room temperature between 20 and 25 degrees C (68 and 77 degrees F). This medication may cause accidental overdose and death if taken by other adults, children, or pets. Mix any unused medication with a substance like cat litter or coffee grounds. Then throw the medication away in a sealed container like a sealed bag or a coffee can with a lid. Do not use the medication afterthe expiration date. NOTE: This sheet is a summary. It may not cover all possible information. If you have questions about this medicine, talk to your doctor, pharmacist, orhealth care provider.  2022 Elsevier/Gold Standard (2020-06-06 12:57:29)  Eszopiclone tablets What is this medication? ESZOPICLONE (es ZOE pi clone) is used to treat insomnia. This medicine helpsyou to fall asleep and sleep through the night. This medicine may be used for other  purposes; ask your health care provider orpharmacist if you have questions. COMMON BRAND NAME(S): Lunesta What should I tell my care team before I take this medication? They need to know if you have any of these conditions: depression history of a drug or alcohol abuse problem liver disease lung or breathing  disease sleep-walking, driving, eating or other activity while not fully awake after taking a sleep medicine suicidal thoughts an unusual or allergic reaction to eszopiclone, other medicines, foods, dyes, or preservatives pregnant or trying to get pregnant breast-feeding How should I use this medication? Take this medicine by mouth with a glass of water. Follow the directions on the prescription label. It is better to take this medicine on an empty stomach and only when you are ready for bed. Do not take your medicine more often than directed. If you have been taking this medicine for several weeks and suddenly stop taking it, you may get unpleasant withdrawal symptoms. Your doctor or health care professional may want to gradually reduce the dose. Do not stop taking this medicine on your own. Always follow your doctor or health careprofessional's advice. Talk to your pediatrician regarding the use of this medicine in children.Special care may be needed. Overdosage: If you think you have taken too much of this medicine contact apoison control center or emergency room at once. NOTE: This medicine is only for you. Do not share this medicine with others. What if I miss a dose? This does not apply. This medicine should only be taken immediately beforegoing to sleep. Do not take double or extra doses. What may interact with this medication? herbal medicines like kava kava, melatonin, St. John's wort and valerian lorazepam medicines for fungal infections like ketoconazole, fluconazole, or itraconazole olanzapine This list may not describe all possible interactions. Give your health care provider a list of all the medicines, herbs, non-prescription drugs, or dietary supplements you use. Also tell them if you smoke, drink alcohol, or use illegaldrugs. Some items may interact with your medicine. What should I watch for while using this medication? Visit your doctor or health care professional for regular  checks on your progress. Keep a regular sleep schedule by going to bed at about the same time nightly. Avoid caffeine-containing drinks in the evening hours, as caffeine can cause trouble with falling asleep. Talk to your doctor if you still havetrouble sleeping. After taking this medicine, you may get up out of bed and do an activity that you do not know you are doing. The next morning, you may have no memory of this. Activities include driving a car ("sleep-driving"), making and eating food, talking on the phone, sexual activity, and sleep-walking. Serious injuries have occurred. Stop the medicine and call your doctor right away if you find out you have done any of these activities. Do not take this medicine if you have used alcohol that evening. Do not take it if you have taken anothermedicine for sleep. The risk of doing these sleep-related activities is higher. Do not take this medicine unless you are able to stay in bed for a full night (7 to 8 hours) before you must be active again. You may have a decrease in mental alertness the day after use, even if you feel that you are fully awake. Tell your doctor if you will need to perform activities requiring full alertness, such as driving, the next day. Do not stand or sit up quickly after taking this medicine, especially if you are an older patient. This reduces  therisk of dizzy or fainting spells. If you or your family notice any changes in your behavior, such as new or worsening depression, thoughts of harming yourself, anxiety, other unusual ordisturbing thoughts, or memory loss, call your doctor right away. After you stop taking this medicine, you may have trouble falling asleep. This is called rebound insomnia. This problem usually goes away on its own after 1or 2 nights. What side effects may I notice from receiving this medication? Side effects that you should report to your doctor or health care professionalas soon as possible: allergic reactions  like skin rash, itching or hives, swelling of the face, lips, or tongue changes in vision confusion depressed mood feeling faint or lightheaded, falls hallucinations problems with balance, speaking, walking restlessness, excitability, or feelings of agitation unusual activities while not fully awake like driving, eating, making phone calls Side effects that usually do not require medical attention (report to yourdoctor or health care professional if they continue or are bothersome): dizziness, or daytime drowsiness, sometimes called a hangover effect headache This list may not describe all possible side effects. Call your doctor for medical advice about side effects. You may report side effects to FDA at1-800-FDA-1088. Where should I keep my medication? Keep out of the reach of children. This medicine can be abused. Keep your medicine in a safe place to protect it from theft. Do not share this medicine with anyone. Selling or giving away this medicine is dangerous and against thelaw. This medicine may cause accidental overdose and death if taken by other adults, children, or pets. Mix any unused medicine with a substance like cat litter or coffee grounds. Then throw the medicine away in a sealed container like a sealed bag or a coffee can with a lid. Do not use the medicine after theexpiration date. Store at room temperature between 15 and 30 degrees C (59 and 86 degrees F). NOTE: This sheet is a summary. It may not cover all possible information. If you have questions about this medicine, talk to your doctor, pharmacist, orhealth care provider.  2022 Elsevier/Gold Standard (2017-11-04 11:57:05)  Insomnia Insomnia is a sleep disorder that makes it difficult to fall asleep or stay asleep. Insomnia can cause fatigue, low energy, difficulty concentrating, moodswings, and poor performance at work or school. There are three different ways to classify insomnia: Difficulty falling  asleep. Difficulty staying asleep. Waking up too early in the morning. Any type of insomnia can be long-term (chronic) or short-term (acute). Both are common. Short-term insomnia usually lasts for three months or less. Chronic insomnia occurs at least three times a week for longer than threemonths. What are the causes? Insomnia may be caused by another condition, situation, or substance, such as: Anxiety. Certain medicines. Gastroesophageal reflux disease (GERD) or other gastrointestinal conditions. Asthma or other breathing conditions. Restless legs syndrome, sleep apnea, or other sleep disorders. Chronic pain. Menopause. Stroke. Abuse of alcohol, tobacco, or illegal drugs. Mental health conditions, such as depression. Caffeine. Neurological disorders, such as Alzheimer's disease. An overactive thyroid (hyperthyroidism). Sometimes, the cause of insomnia may not be known. What increases the risk? Risk factors for insomnia include: Gender. Women are affected more often than men. Age. Insomnia is more common as you get older. Stress. Lack of exercise. Irregular work schedule or working night shifts. Traveling between different time zones. Certain medical and mental health conditions. What are the signs or symptoms? If you have insomnia, the main symptom is having trouble falling asleep or having trouble staying asleep. This may  lead to other symptoms, such as: Feeling fatigued or having low energy. Feeling nervous about going to sleep. Not feeling rested in the morning. Having trouble concentrating. Feeling irritable, anxious, or depressed. How is this diagnosed? This condition may be diagnosed based on: Your symptoms and medical history. Your health care provider may ask about: Your sleep habits. Any medical conditions you have. Your mental health. A physical exam. How is this treated? Treatment for insomnia depends on the cause. Treatment may focus on treating an  underlying condition that is causing insomnia. Treatment may also include: Medicines to help you sleep. Counseling or therapy. Lifestyle adjustments to help you sleep better. Follow these instructions at home: Eating and drinking  Limit or avoid alcohol, caffeinated beverages, and cigarettes, especially close to bedtime. These can disrupt your sleep. Do not eat a large meal or eat spicy foods right before bedtime. This can lead to digestive discomfort that can make it hard for you to sleep.  Sleep habits  Keep a sleep diary to help you and your health care provider figure out what could be causing your insomnia. Write down: When you sleep. When you wake up during the night. How well you sleep. How rested you feel the next day. Any side effects of medicines you are taking. What you eat and drink. Make your bedroom a dark, comfortable place where it is easy to fall asleep. Put up shades or blackout curtains to block light from outside. Use a white noise machine to block noise. Keep the temperature cool. Limit screen use before bedtime. This includes: Watching TV. Using your smartphone, tablet, or computer. Stick to a routine that includes going to bed and waking up at the same times every day and night. This can help you fall asleep faster. Consider making a quiet activity, such as reading, part of your nighttime routine. Try to avoid taking naps during the day so that you sleep better at night. Get out of bed if you are still awake after 15 minutes of trying to sleep. Keep the lights down, but try reading or doing a quiet activity. When you feel sleepy, go back to bed.  General instructions Take over-the-counter and prescription medicines only as told by your health care provider. Exercise regularly, as told by your health care provider. Avoid exercise starting several hours before bedtime. Use relaxation techniques to manage stress. Ask your health care provider to suggest some  techniques that may work well for you. These may include: Breathing exercises. Routines to release muscle tension. Visualizing peaceful scenes. Make sure that you drive carefully. Avoid driving if you feel very sleepy. Keep all follow-up visits as told by your health care provider. This is important. Contact a health care provider if: You are tired throughout the day. You have trouble in your daily routine due to sleepiness. You continue to have sleep problems, or your sleep problems get worse. Get help right away if: You have serious thoughts about hurting yourself or someone else. If you ever feel like you may hurt yourself or others, or have thoughts about taking your own life, get help right away. You can go to your nearest emergency department or call: Your local emergency services (911 in the [place] A suicide crisis helpline, such as the [facility] at 208-664-3099. This is open 24 hours a day. Summary Insomnia is a sleep disorder that makes it difficult to fall asleep or stay asleep. Insomnia can be long-term (chronic) or short-term (acute). Treatment for insomnia  depends on the cause. Treatment may focus on treating an underlying condition that is causing insomnia. Keep a sleep diary to help you and your health care provider figure out what could be causing your insomnia. This information is not intended to replace advice given to you by your health care provider. Make sure you discuss any questions you have with your healthcare provider. Document Revised: 03/20/2020 Document Reviewed: 03/20/2020 Elsevier Patient Education  2022 [employer].

## 2021-01-09 ENCOUNTER — Other Ambulatory Visit: Payer: Self-pay | Admitting: Internal Medicine

## 2021-01-29 DIAGNOSIS — Z79891 Long term (current) use of opiate analgesic: Secondary | ICD-10-CM | POA: Diagnosis not present

## 2021-01-29 DIAGNOSIS — M47812 Spondylosis without myelopathy or radiculopathy, cervical region: Secondary | ICD-10-CM | POA: Diagnosis not present

## 2021-01-29 DIAGNOSIS — G894 Chronic pain syndrome: Secondary | ICD-10-CM | POA: Diagnosis not present

## 2021-01-29 DIAGNOSIS — M4802 Spinal stenosis, cervical region: Secondary | ICD-10-CM | POA: Diagnosis not present

## 2021-02-02 ENCOUNTER — Encounter: Payer: Medicare Other | Admitting: Counselor

## 2021-02-04 DIAGNOSIS — M545 Low back pain, unspecified: Secondary | ICD-10-CM | POA: Diagnosis not present

## 2021-02-04 DIAGNOSIS — M48061 Spinal stenosis, lumbar region without neurogenic claudication: Secondary | ICD-10-CM | POA: Diagnosis not present

## 2021-02-04 DIAGNOSIS — R030 Elevated blood-pressure reading, without diagnosis of hypertension: Secondary | ICD-10-CM | POA: Diagnosis not present

## 2021-02-04 DIAGNOSIS — M5416 Radiculopathy, lumbar region: Secondary | ICD-10-CM | POA: Diagnosis not present

## 2021-02-04 DIAGNOSIS — M47816 Spondylosis without myelopathy or radiculopathy, lumbar region: Secondary | ICD-10-CM | POA: Diagnosis not present

## 2021-02-09 ENCOUNTER — Encounter: Payer: Medicare Other | Admitting: Counselor

## 2021-02-10 ENCOUNTER — Other Ambulatory Visit: Payer: Self-pay | Admitting: Neurosurgery

## 2021-02-10 DIAGNOSIS — M48061 Spinal stenosis, lumbar region without neurogenic claudication: Secondary | ICD-10-CM

## 2021-02-22 ENCOUNTER — Other Ambulatory Visit: Payer: Self-pay

## 2021-02-22 ENCOUNTER — Ambulatory Visit
Admission: RE | Admit: 2021-02-22 | Discharge: 2021-02-22 | Disposition: A | Discharge status: Home / Self Care | Payer: Self-pay | Source: Ambulatory Visit | Attending: Neurosurgery | Admitting: Neurosurgery

## 2021-02-22 DIAGNOSIS — M48061 Spinal stenosis, lumbar region without neurogenic claudication: Secondary | ICD-10-CM | POA: Diagnosis not present

## 2021-02-22 DIAGNOSIS — M545 Low back pain, unspecified: Secondary | ICD-10-CM | POA: Diagnosis not present

## 2021-02-24 ENCOUNTER — Other Ambulatory Visit: Payer: Medicare Other

## 2021-03-03 DIAGNOSIS — R030 Elevated blood-pressure reading, without diagnosis of hypertension: Secondary | ICD-10-CM | POA: Diagnosis not present

## 2021-03-03 DIAGNOSIS — M545 Low back pain, unspecified: Secondary | ICD-10-CM | POA: Diagnosis not present

## 2021-03-03 DIAGNOSIS — M48061 Spinal stenosis, lumbar region without neurogenic claudication: Secondary | ICD-10-CM | POA: Diagnosis not present

## 2021-03-03 DIAGNOSIS — M47816 Spondylosis without myelopathy or radiculopathy, lumbar region: Secondary | ICD-10-CM | POA: Diagnosis not present

## 2021-03-03 DIAGNOSIS — Z13828 Encounter for screening for other musculoskeletal disorder: Secondary | ICD-10-CM | POA: Insufficient documentation

## 2021-03-17 ENCOUNTER — Other Ambulatory Visit: Payer: Self-pay | Admitting: Internal Medicine

## 2021-03-26 ENCOUNTER — Encounter: Payer: Self-pay | Admitting: Internal Medicine

## 2021-03-27 ENCOUNTER — Telehealth: Payer: Self-pay | Admitting: Internal Medicine

## 2021-03-27 ENCOUNTER — Other Ambulatory Visit: Payer: Self-pay | Admitting: Neurological Surgery

## 2021-03-27 DIAGNOSIS — R739 Hyperglycemia, unspecified: Secondary | ICD-10-CM

## 2021-03-27 DIAGNOSIS — E7800 Pure hypercholesterolemia, unspecified: Secondary | ICD-10-CM

## 2021-03-27 NOTE — Telephone Encounter (Signed)
Patient has a appt 03/30/2021, there are no orders in.

## 2021-03-27 NOTE — Telephone Encounter (Signed)
Was previously prescribed by urology that he no longer sees but he is going to give his new urologist a call.

## 2021-03-27 NOTE — Telephone Encounter (Signed)
See me before calling pt. This is not a medication that we have listed.  I have never prescribed.  If he has taken and has been prescribed by another provider (urology, etc) - will need to have them refill.

## 2021-03-28 NOTE — Addendum Note (Signed)
Addended by: Alisa Graff on: 03/28/2021 04:59 PM   Modules accepted: Orders

## 2021-03-28 NOTE — Telephone Encounter (Signed)
Orders placed for f/u labs.  

## 2021-03-30 ENCOUNTER — Other Ambulatory Visit (INDEPENDENT_AMBULATORY_CARE_PROVIDER_SITE_OTHER): Payer: Medicare Other

## 2021-03-30 ENCOUNTER — Telehealth: Payer: Self-pay | Admitting: Internal Medicine

## 2021-03-30 ENCOUNTER — Other Ambulatory Visit: Payer: Self-pay

## 2021-03-30 DIAGNOSIS — E7800 Pure hypercholesterolemia, unspecified: Secondary | ICD-10-CM

## 2021-03-30 DIAGNOSIS — R739 Hyperglycemia, unspecified: Secondary | ICD-10-CM

## 2021-03-30 DIAGNOSIS — C61 Malignant neoplasm of prostate: Secondary | ICD-10-CM

## 2021-03-30 LAB — LIPID PANEL
Cholesterol: 160 mg/dL (ref 0–200)
HDL: 74.6 mg/dL (ref >39.00)
LDL Cholesterol: 64 mg/dL (ref 0–99)
NonHDL: 84.97
Total CHOL/HDL Ratio: 2
Triglycerides: 107 mg/dL (ref 0.0–149.0)
VLDL: 21.4 mg/dL (ref 0.0–40.0)

## 2021-03-30 LAB — BASIC METABOLIC PANEL
BUN: 21 mg/dL (ref 6–23)
CO2: 30 mEq/L (ref 19–32)
Calcium: 9.2 mg/dL (ref 8.4–10.5)
Chloride: 102 mEq/L (ref 96–112)
Creatinine, Ser: 0.87 mg/dL (ref 0.40–1.50)
GFR: 87.23 mL/min (ref >60.00)
Glucose, Bld: 111 mg/dL — ABNORMAL HIGH (ref 70–99)
Potassium: 4.3 mEq/L (ref 3.5–5.1)
Sodium: 139 mEq/L (ref 135–145)

## 2021-03-30 LAB — HEPATIC FUNCTION PANEL
ALT: 21 U/L (ref 0–53)
AST: 23 U/L (ref 0–37)
Albumin: 4.5 g/dL (ref 3.5–5.2)
Alkaline Phosphatase: 112 U/L (ref 39–117)
Bilirubin, Direct: 0.1 mg/dL (ref 0.0–0.3)
Total Bilirubin: 0.6 mg/dL (ref 0.2–1.2)
Total Protein: 6.7 g/dL (ref 6.0–8.3)

## 2021-03-30 LAB — HEMOGLOBIN A1C: Hgb A1c MFr Bld: 5.8 % (ref 4.6–6.5)

## 2021-03-30 LAB — TSH: TSH: 1.96 u[IU]/mL (ref 0.35–5.50)

## 2021-03-30 NOTE — Telephone Encounter (Signed)
Patient was here today and he forgot to ask to have his PSA checked. He would like to have lab orders put in so he can have that checked.

## 2021-03-31 ENCOUNTER — Other Ambulatory Visit (INDEPENDENT_AMBULATORY_CARE_PROVIDER_SITE_OTHER): Payer: Medicare Other

## 2021-03-31 ENCOUNTER — Encounter: Payer: Self-pay | Admitting: Vascular Surgery

## 2021-03-31 ENCOUNTER — Ambulatory Visit: Payer: Medicare Other | Admitting: Vascular Surgery

## 2021-03-31 ENCOUNTER — Other Ambulatory Visit: Payer: Self-pay

## 2021-03-31 DIAGNOSIS — C61 Malignant neoplasm of prostate: Secondary | ICD-10-CM | POA: Diagnosis not present

## 2021-03-31 DIAGNOSIS — M545 Low back pain, unspecified: Secondary | ICD-10-CM | POA: Diagnosis not present

## 2021-03-31 DIAGNOSIS — G8929 Other chronic pain: Secondary | ICD-10-CM

## 2021-03-31 DIAGNOSIS — M549 Dorsalgia, unspecified: Secondary | ICD-10-CM | POA: Insufficient documentation

## 2021-03-31 LAB — PSA: PSA: 9.28 ng/mL — ABNORMAL HIGH (ref 0.10–4.00)

## 2021-03-31 NOTE — Telephone Encounter (Signed)
PSA added. Patient is aware. Will come in to be redrawn if needed. Add on sheet faxed

## 2021-03-31 NOTE — Addendum Note (Signed)
Addended by: Lars Masson on: 03/31/2021 03:41 PM   Modules accepted: Orders

## 2021-03-31 NOTE — Progress Notes (Signed)
Patient name: Mike Farley MRN: [id number] DOB: 10-Nov-1950 Sex: male  REASON FOR CONSULT: Evaluate for L4-L5 and L5-S1 ALIF  HPI: Mike Farley is a 70 y.o. male, with history of hyperlipidemia and prostate cancer who presents for evaluation of L4-L5 and L5-S1 ALIF.  He describes at least 8 months of severe lower back pain.  He has failed conservative management.  Ultimately has been evaluated by neurosurgery by Dr. Vertell Limber and he has recommended a two-level anterior approach at L4-L5 and L5-S1 and vascular surgery was asked to assist with exposure.  He states his previous abdominal surgeries include laparoscopic cholecystectomy.  He is a retired Community education officer.  Past Medical History:  Diagnosis Date   Anxiety    Arthritis    shoulder   Cancer ([place])    prostate ca - Md just watching   Cervical pain (neck)    limited mobility   Chronic pain    Depression    years ago (minor)   HLD (hyperlipidemia)    Hx of cold sores    Mild cognitive impairment with memory loss     Past Surgical History:  Procedure Laterality Date   ANTERIOR FUSION CERVICAL SPINE  2011   4-5 levels   APPENDECTOMY     BONE EXCISION Left 09/28/2017   Procedure: BONE EXCISION-TAILORS  EXOSTECTOMY;  Surgeon: Samara Deist, DPM;  Location: [place];  Service: Podiatry;  Laterality: Left;  IVA LOCAL   CERVICAL DISCECTOMY  2008   C2-C7 Fused    CHOLECYSTECTOMY     COLONOSCOPY  2020   ELBOW FRACTURE SURGERY Left 2004   METATARSAL HEAD EXCISION Left 07/04/2020   Procedure: METATARSAL HEAD EXCISION;  Surgeon: Samara Deist, DPM;  Location: [facility];  Service: Podiatry;  Laterality: Left;   PROSTATE BIOPSY N/A 03/15/2019   Procedure: PROSTATE BIOPSY [facility];  Surgeon: Royston Cowper, MD;  Location: [facility];  Service: Urology;  Laterality: N/A;   RADIOLOGY WITH ANESTHESIA N/A 04/22/2020   Procedure: MRI WITH ANESTHESIA BRAIN WITHOUT CONTRAST;  Surgeon: Radiologist, Medication, MD;  Location:  [place];  Service: Radiology;  Laterality: N/A;   SHOULDER SURGERY Right    TONSILLECTOMY AND ADENOIDECTOMY     TOTAL KNEE ARTHROPLASTY Right 2005   x 4    Family History  Problem Relation Age of Onset   Arthritis Mother    Cancer Mother        colon & breast cnacer   Mental illness Mother        Depression & bipolar   Cancer Father        lung & prostate cancer   Alcohol abuse Brother    Kidney disease Brother     SOCIAL HISTORY: Social History   Socioeconomic History   Marital status: Married    Spouse name: Not on file   Number of children: Not on file   Years of education: Not on file   Highest education level: Not on file  Occupational History   Not on file  Tobacco Use   Smoking status: Former   Smokeless tobacco: Current    Types: Chew   Tobacco comments:    Quit yrs ago  Vaping Use   Vaping Use: Never used  Substance and Sexual Activity   Alcohol use: Yes    Alcohol/week: 7.0 standard drinks    Types: 7 Standard drinks or equivalent per week    Comment: beer/wine   Drug use: Never   Sexual activity: Not  Currently  Other Topics Concern   Not on file  Social History Narrative   Not on file   Social Determinants of Health   Financial Resource Strain: Low Risk    Difficulty of Paying Living Expenses: Not hard at all  Food Insecurity: No Food Insecurity   Worried About Charity fundraiser in the Last Year: Never true   [place] in the Last Year: Never true  Transportation Needs: No Transportation Needs   Lack of Transportation (Medical): No   Lack of Transportation (Non-Medical): No  Physical Activity: Not on file  Stress: No Stress Concern Present   Feeling of Stress : Not at all  Social Connections: Unknown   Frequency of Communication with Friends and Family: Not on file   Frequency of Social Gatherings with Friends and Family: Not on file   Attends Religious Services: Not on file   Active Member of Clubs or Organizations: Not on file    Attends Archivist Meetings: Not on file   Marital Status: Married  Human resources officer Violence: Not At Risk   Fear of Current or Ex-Partner: No   Emotionally Abused: No   Physically Abused: No   Sexually Abused: No    Allergies  Allergen Reactions   Morphine Itching and Rash   Amoxicillin     REACTION: abdominal distention and churning pain in abdomen. Did it involve swelling of the face/tongue/throat, SOB, or low BP? No Did it involve sudden or severe rash/hives, skin peeling, or any reaction on the inside of your mouth or nose? No Did you need to seek medical attention at a hospital or doctor's office? No When did it last happen?      40+ years ago If all above answers are "NO", may proceed with cephalosporin use.     Current Outpatient Medications  Medication Sig Dispense Refill   cholecalciferol (VITAMIN D3) 25 MCG (1000 UT) tablet Take 1,000 Units by mouth 3 (three) times a week.      cyanocobalamin 1000 MCG tablet Take 1,000 mcg by mouth daily.      donepezil (ARICEPT) 5 MG tablet Take 5 mg by mouth daily.     Melatonin 10 MG TABS Take 30 mg by mouth at bedtime as needed (sleep).     Multiple Vitamin (MULTIVITAMIN) tablet Take 1 tablet by mouth daily. ABC Complete MEN     oxyCODONE (ROXICODONE) 15 MG immediate release tablet Take 15 mg by mouth 4 (four) times daily.     oxymetazoline (AFRIN) 0.05 % nasal spray Place 1 spray into both nostrils at bedtime.     QUEtiapine (SEROQUEL) 300 MG tablet TAKE ONE TABLET AT BEDTIME. 90 tablet 1   rosuvastatin (CRESTOR) 10 MG tablet TAKE 1 TABLET BY MOUTH DAILY 30 tablet 5   tamsulosin (FLOMAX) 0.4 MG CAPS capsule Take 0.4 mg by mouth daily.     mupirocin ointment (BACTROBAN) 2 % Apply 1 application topically 2 (two) times daily. 22 g 0   valACYclovir (VALTREX) 500 MG tablet Take 1 tablet (500 mg total) by mouth 2 (two) times daily. (Patient taking differently: Take 500 mg by mouth daily as needed (Cold sores).) 20 tablet 0    No current facility-administered medications for this visit.    REVIEW OF SYSTEMS:  [X]  denotes positive finding, [ ]  denotes negative finding Cardiac  Comments:  Chest pain or chest pressure:    Shortness of breath upon exertion:    Short of breath when lying flat:  Irregular heart rhythm:        Vascular    Pain in calf, thigh, or hip brought on by ambulation:    Pain in feet at night that wakes you up from your sleep:     Blood clot in your veins:    Leg swelling:         Pulmonary    Oxygen at home:    Productive cough:     Wheezing:         Neurologic    Sudden weakness in arms or legs:     Sudden numbness in arms or legs:     Sudden onset of difficulty speaking or slurred speech:    Temporary loss of vision in one eye:     Problems with dizziness:         Gastrointestinal    Blood in stool:     Vomited blood:         Genitourinary    Burning when urinating:     Blood in urine:        Psychiatric    Major depression:         Hematologic    Bleeding problems:    Problems with blood clotting too easily:        Skin    Rashes or ulcers:        Constitutional    Fever or chills:      PHYSICAL EXAM: Vitals:   03/31/21 0858  BP: 127/75  Pulse: 79  Resp: 16  Temp: 97.9 F (36.6 C)  TempSrc: Temporal  SpO2: 94%  Weight: 166 lb (75.3 kg)  Height: 5\' 9"  (1.753 m)    GENERAL: The patient is a well-nourished male, in no acute distress. The vital signs are documented above. CARDIAC: There is a regular rate and rhythm.  VASCULAR:  Palpable femoral pulses bilaterally Palpable PT pulses bilaterally PULMONARY: No respiratory distress ABDOMEN: Soft and non-tender.  Well-healed previous laparoscopic incisions from cholecystectomy MUSCULOSKELETAL: There are no major deformities or cyanosis. NEUROLOGIC: No focal weakness or paresthesias are detected. SKIN: There are no ulcers or rashes noted. PSYCHIATRIC: The patient has a normal affect.  DATA:    MRI lumbar spine reviewed from 02/22/2021 and the aortic bifurcation is at the L4-L5 disc space with the vein bifurcation at the top of L5.  Assessment/Plan:  70 year old male with chronic lower back pain who presents for evaluation of two-level ALIF at L4-L5 and L5-S1.  I discussed my role as a vascular surgeon for abdominal exposure at these levels.  Discussed paramedian incision over the left rectus muscle with mobilization of the rectus muscle and entering the retroperitoneum and moving the peritoneum and left ureter as well as the iliac artery and iliac vein to get to the L4-L5 and L5-S1 disc space.  We talked about risk and injury to the above structures.  I have reviewed his recent MRI and I think he will be a good candidate for anterior approach.  Questions answered.  Look forward to assisting Dr. Reatha Armour on 04/23/2021.   [name], MD [facility] Office: [phone]

## 2021-03-31 NOTE — Telephone Encounter (Signed)
Patient had a PSA drawn 08/2020. He had labs drawn here this morning. Are you ok with adding a PSA?

## 2021-03-31 NOTE — Telephone Encounter (Signed)
Ok to check.

## 2021-04-01 DIAGNOSIS — R030 Elevated blood-pressure reading, without diagnosis of hypertension: Secondary | ICD-10-CM | POA: Diagnosis not present

## 2021-04-01 DIAGNOSIS — M47816 Spondylosis without myelopathy or radiculopathy, lumbar region: Secondary | ICD-10-CM | POA: Diagnosis not present

## 2021-04-03 ENCOUNTER — Other Ambulatory Visit: Payer: Self-pay

## 2021-04-13 DIAGNOSIS — M4802 Spinal stenosis, cervical region: Secondary | ICD-10-CM | POA: Diagnosis not present

## 2021-04-13 DIAGNOSIS — M47812 Spondylosis without myelopathy or radiculopathy, cervical region: Secondary | ICD-10-CM | POA: Diagnosis not present

## 2021-04-13 DIAGNOSIS — G894 Chronic pain syndrome: Secondary | ICD-10-CM | POA: Diagnosis not present

## 2021-04-13 DIAGNOSIS — Z79891 Long term (current) use of opiate analgesic: Secondary | ICD-10-CM | POA: Diagnosis not present

## 2021-04-20 NOTE — Progress Notes (Signed)
Surgical Instructions   Your procedure is scheduled on Thursday 04/23/2021.  Report to [facility] Main Entrance "A" at 05:30 A.M., then check in with the Admitting office.  Call [phone] if you have problems or questions between now and the morning of surgery:   Remember: Do not eat after midnight the night before your surgery  You may drink clear liquids until 04:30 the morning of your surgery.   Clear liquids allowed are: Water, Non-Citrus Juices (without pulp), Carbonated Beverages, Clear Tea, Black Coffee Only (NO MILK, CREAM, or POWDERED CREAMER of any kind), and Gatorade   Take these medicines the morning of surgery with A SIP OF WATER:  Donepezil (Aricept) Methocarbamol (Robaxin) Tamsulosin (Flomax)   If needed you may take these medications the morning of surgery: Oxycodone (Roxicodone)   As of today, STOP taking any Aspirin (unless otherwise instructed by your surgeon) or Aspirin-containing products; NSAIDS - Aleve, Naproxen, Ibuprofen, Motrin, Advil, Goody's, BC's, all herbal medications, fish oil, and all vitamins.   3 days leading up to your surgery or after your pre-procedure COVID test You are not required to quarantine however you are required to wear a well-fitting mask when you are out and around people not in your household.  If your mask becomes wet or soiled, replace with a new one.  Wash your hands often with soap and water for 20 seconds or clean your hands with an alcohol-based hand sanitizer that contains at least 60% alcohol.  Do not share personal items.  Notify your provider: if you are in close contact with someone who has COVID  or if you develop a fever of 100.4 or greater, sneezing, cough, sore throat, shortness of breath or body aches.          Do not wear jewelry or makeup  Do not wear lotions, powders, perfumes/colognes, or deodorant.  Do not shave 48 hours prior to surgery.  Men may shave face and neck.  Do not wear nail polish, gel  polish, artificial nails, or any other type of covering on natural nails including fingernails and toenails. If patients have artificial nails, gel coating, etc. that need to be removed by a nail salon please have this removed prior to surgery or surgery may need to be canceled/delayed if the surgeon/ anesthesia feels like the patient is unable to be adequately monitored.  Do not bring valuables to the hospital - [facility] is not responsible for any belongings or valuables.  Do NOT Smoke (Tobacco/Vaping) or drink Alcohol 24 hours prior to your procedure  If you use a CPAP at night, please bring your mask for your overnight stay.   Contacts, glasses, hearing aids, dentures or partials may not be worn into surgery, please bring cases for these belongings   For patients admitted to the hospital, discharge time will be determined by your treatment team.   Patients discharged the day of surgery will not be allowed to drive home, and someone needs to stay with them for 24 hours.  NO VISITORS WILL BE ALLOWED IN PRE-OP WHERE PATIENTS ARE PREPPED FOR SURGERY.  ONLY 1 SUPPORT PERSON MAY BE PRESENT IN THE WAITING ROOM WHILE YOU ARE IN SURGERY.  IF YOU ARE TO BE ADMITTED, ONCE YOU ARE IN YOUR ROOM YOU WILL BE ALLOWED TWO (2) VISITORS. 1 (ONE) VISITOR MAY STAY OVERNIGHT BUT MUST ARRIVE TO THE ROOM BY 8pm.  Minor children may have two parents present. Special consideration for safety and communication needs will be reviewed on a  case by case basis.  Special instructions:    Oral Hygiene is also important to reduce your risk of infection.  Remember - BRUSH YOUR TEETH THE MORNING OF SURGERY WITH YOUR REGULAR TOOTHPASTE   [place]- Preparing For Surgery  Before surgery, you can play an important role. Because skin is not sterile, your skin needs to be as free of germs as possible. You can reduce the number of germs on your skin by washing with CHG (chlorahexidine gluconate) Soap before surgery.  CHG is  an antiseptic cleaner which kills germs and bonds with the skin to continue killing germs even after washing.     Please do not use if you have an allergy to CHG or antibacterial soaps. If your skin becomes reddened/irritated stop using the CHG.  Do not shave (including legs and underarms) for at least 48 hours prior to first CHG shower. It is OK to shave your face.  Please follow these instructions carefully.     Shower the NIGHT BEFORE SURGERY and the MORNING OF SURGERY with CHG Soap.   If you chose to wash your hair, wash your hair first as usual with your normal shampoo. After you shampoo, rinse your hair and body thoroughly to remove the shampoo.    Then [employer] and genitals (private parts) with your normal soap and rinse thoroughly to remove soap.  Next use the CHG Soap as you would any other liquid soap. You can apply CHG directly to the skin and wash gently with a clean washcloth.   Apply the CHG Soap to your body ONLY FROM THE NECK DOWN.  Do not use on open wounds or open sores. Avoid contact with your eyes, ears, mouth and genitals (private parts). Wash Face and genitals (private parts)  with your normal soap.   Wash thoroughly, paying special attention to the area where your surgery will be performed.  Thoroughly rinse your body with warm water from the neck down.  DO NOT shower/wash with your normal soap after using and rinsing off the CHG Soap.  Pat yourself dry with a CLEAN TOWEL.  Wear CLEAN PAJAMAS to bed the night before surgery  Place CLEAN SHEETS on your bed the night before your surgery  DO NOT SLEEP WITH PETS.   Day of Surgery:  Take a shower with CHG soap. Wear Clean/Comfortable clothing the morning of surgery Do not apply any deodorants/lotions.   Remember to brush your teeth WITH YOUR REGULAR TOOTHPASTE.   Please read over the fact sheets that you were given.

## 2021-04-21 ENCOUNTER — Encounter (HOSPITAL_COMMUNITY)
Admission: RE | Admit: 2021-04-21 | Discharge: 2021-04-21 | Disposition: A | Discharge status: Home / Self Care | Payer: Medicare Other | Source: Ambulatory Visit | Attending: Neurological Surgery | Admitting: Neurological Surgery

## 2021-04-21 ENCOUNTER — Encounter (HOSPITAL_COMMUNITY): Payer: Self-pay

## 2021-04-21 ENCOUNTER — Encounter: Payer: Medicare Other | Admitting: Vascular Surgery

## 2021-04-21 ENCOUNTER — Other Ambulatory Visit: Payer: Self-pay

## 2021-04-21 DIAGNOSIS — M47819 Spondylosis without myelopathy or radiculopathy, site unspecified: Secondary | ICD-10-CM | POA: Diagnosis not present

## 2021-04-21 DIAGNOSIS — T182XXA Foreign body in stomach, initial encounter: Secondary | ICD-10-CM | POA: Diagnosis not present

## 2021-04-21 DIAGNOSIS — Z79899 Other long term (current) drug therapy: Secondary | ICD-10-CM | POA: Diagnosis not present

## 2021-04-21 DIAGNOSIS — Z981 Arthrodesis status: Secondary | ICD-10-CM | POA: Diagnosis not present

## 2021-04-21 DIAGNOSIS — F17220 Nicotine dependence, chewing tobacco, uncomplicated: Secondary | ICD-10-CM | POA: Diagnosis not present

## 2021-04-21 DIAGNOSIS — Z885 Allergy status to narcotic agent status: Secondary | ICD-10-CM | POA: Diagnosis not present

## 2021-04-21 DIAGNOSIS — Z01818 Encounter for other preprocedural examination: Secondary | ICD-10-CM

## 2021-04-21 DIAGNOSIS — Z8546 Personal history of malignant neoplasm of prostate: Secondary | ICD-10-CM | POA: Diagnosis not present

## 2021-04-21 DIAGNOSIS — M5136 Other intervertebral disc degeneration, lumbar region: Secondary | ICD-10-CM | POA: Diagnosis not present

## 2021-04-21 DIAGNOSIS — M48061 Spinal stenosis, lumbar region without neurogenic claudication: Secondary | ICD-10-CM | POA: Diagnosis not present

## 2021-04-21 DIAGNOSIS — G4700 Insomnia, unspecified: Secondary | ICD-10-CM | POA: Diagnosis not present

## 2021-04-21 DIAGNOSIS — Z20822 Contact with and (suspected) exposure to covid-19: Secondary | ICD-10-CM | POA: Insufficient documentation

## 2021-04-21 DIAGNOSIS — Z9889 Other specified postprocedural states: Secondary | ICD-10-CM | POA: Diagnosis not present

## 2021-04-21 DIAGNOSIS — M4316 Spondylolisthesis, lumbar region: Secondary | ICD-10-CM | POA: Diagnosis not present

## 2021-04-21 DIAGNOSIS — M4856XA Collapsed vertebra, not elsewhere classified, lumbar region, initial encounter for fracture: Secondary | ICD-10-CM | POA: Diagnosis not present

## 2021-04-21 DIAGNOSIS — M549 Dorsalgia, unspecified: Secondary | ICD-10-CM | POA: Diagnosis not present

## 2021-04-21 DIAGNOSIS — M5137 Other intervertebral disc degeneration, lumbosacral region: Secondary | ICD-10-CM | POA: Diagnosis not present

## 2021-04-21 DIAGNOSIS — Z880 Allergy status to penicillin: Secondary | ICD-10-CM | POA: Diagnosis not present

## 2021-04-21 DIAGNOSIS — G8929 Other chronic pain: Secondary | ICD-10-CM | POA: Diagnosis not present

## 2021-04-21 DIAGNOSIS — M47816 Spondylosis without myelopathy or radiculopathy, lumbar region: Secondary | ICD-10-CM | POA: Diagnosis not present

## 2021-04-21 DIAGNOSIS — E559 Vitamin D deficiency, unspecified: Secondary | ICD-10-CM | POA: Diagnosis not present

## 2021-04-21 DIAGNOSIS — M47817 Spondylosis without myelopathy or radiculopathy, lumbosacral region: Secondary | ICD-10-CM | POA: Diagnosis not present

## 2021-04-21 DIAGNOSIS — Z96651 Presence of right artificial knee joint: Secondary | ICD-10-CM | POA: Diagnosis not present

## 2021-04-21 DIAGNOSIS — E785 Hyperlipidemia, unspecified: Secondary | ICD-10-CM | POA: Diagnosis not present

## 2021-04-21 DIAGNOSIS — Z13828 Encounter for screening for other musculoskeletal disorder: Secondary | ICD-10-CM | POA: Diagnosis not present

## 2021-04-21 DIAGNOSIS — M4326 Fusion of spine, lumbar region: Secondary | ICD-10-CM | POA: Diagnosis not present

## 2021-04-21 LAB — CBC
HCT: 38.5 % — ABNORMAL LOW (ref 39.0–52.0)
Hemoglobin: 12.7 g/dL — ABNORMAL LOW (ref 13.0–17.0)
MCH: 30.6 pg (ref 26.0–34.0)
MCHC: 33 g/dL (ref 30.0–36.0)
MCV: 92.8 fL (ref 80.0–100.0)
Platelets: 244 10*3/uL (ref 150–400)
RBC: 4.15 MIL/uL — ABNORMAL LOW (ref 4.22–5.81)
RDW: 12.9 % (ref 11.5–15.5)
WBC: 7.7 10*3/uL (ref 4.0–10.5)
nRBC: 0 % (ref 0.0–0.2)

## 2021-04-21 LAB — SURGICAL PCR SCREEN
MRSA, PCR: NEGATIVE
Staphylococcus aureus: NEGATIVE

## 2021-04-21 NOTE — Progress Notes (Signed)
PCP - Einar Pheasant, MD Cardiologist - denies  PPM/ICD - denies Device Orders - n/a Rep Notified - n/a  Chest x-ray - n/a EKG - 07/07/2020 Stress Test - 07/28/2015 ECHO - 07/28/2015 Cardiac Cath - denies  Sleep Study - denies CPAP - n/a  Fasting Blood Sugar - n/a  Blood Thinner Instructions: n/a  Aspirin Instructions: Patient was instructed: As of today, STOP taking any Aspirin (unless otherwise instructed by your surgeon) or Aspirin-containing products; NSAIDS - Aleve, Naproxen, Ibuprofen, Motrin, Advil, Goody's, BC's, all herbal medications, fish oil, and all vitamins.   ERAS Protcol - yes PRE-SURGERY Ensure or G2- no  COVID TEST- done in PAT 04/21/2021   Anesthesia review: yes - patient has history of chest pain and heart murmur but he didn't remember (echo and stress test were done in 2017). He is not complaining of any distress related to his heart; he has active Prostate Cancer but the treatment was postpone until after surgery, per patient.   Patient denies shortness of breath, fever, cough and chest pain at PAT appointment   All instructions explained to the patient, with a verbal understanding of the material. Patient agrees to go over the instructions while at home for a better understanding. Patient also instructed to self quarantine after being tested for COVID-19. The opportunity to ask questions was provided.

## 2021-04-22 LAB — SARS CORONAVIRUS 2 (TAT 6-24 HRS): SARS Coronavirus 2: NEGATIVE

## 2021-04-22 NOTE — Anesthesia Preprocedure Evaluation (Addendum)
Anesthesia Evaluation  Patient identified by MRN, date of birth, ID band Patient awake    Reviewed: Allergy & Precautions, NPO status , Patient's Chart, lab work & pertinent test results  Airway Mallampati: II  TM Distance: >3 FB Neck ROM: Full    Dental  (+) Dental Advisory Given   Pulmonary former smoker,    breath sounds clear to auscultation       Cardiovascular negative cardio ROS   Rhythm:Regular Rate:Normal     Neuro/Psych  Headaches,  Neuromuscular disease    GI/Hepatic negative GI ROS, Neg liver ROS,   Endo/Other  negative endocrine ROS  Renal/GU negative Renal ROS     Musculoskeletal  (+) Arthritis ,   Abdominal   Peds  Hematology  (+) anemia ,   Anesthesia Other Findings   Reproductive/Obstetrics                            Lab Results  Component Value Date   WBC 7.7 04/21/2021   HGB 12.7 (L) 04/21/2021   HCT 38.5 (L) 04/21/2021   MCV 92.8 04/21/2021   PLT 244 04/21/2021   Lab Results  Component Value Date   CREATININE 0.87 03/30/2021   BUN 21 03/30/2021   NA 139 03/30/2021   K 4.3 03/30/2021   CL 102 03/30/2021   CO2 30 03/30/2021    Anesthesia Physical Anesthesia Plan  ASA: 3  Anesthesia Plan: General   Post-op Pain Management: Tylenol PO (pre-op), Ketamine IV and Gabapentin PO (pre-op)   Induction:   PONV Risk Score and Plan: 2 and Dexamethasone and Ondansetron  Airway Management Planned: Oral ETT and Video Laryngoscope Planned  Additional Equipment: Arterial line  Intra-op Plan:   Post-operative Plan: Extubation in OR  Informed Consent: I have reviewed the patients History and Physical, chart, labs and discussed the procedure including the risks, benefits and alternatives for the proposed anesthesia with the patient or authorized representative who has indicated his/her understanding and acceptance.     Dental advisory given  Plan Discussed  with: CRNA  Anesthesia Plan Comments: (2IV's and A-line. GETA. Glidescope     PAT note by Karoline Caldwell, PA-C: Pt had benign cardiac evaluation in 2017 after report of chest pain. Evaluated by Dr. Nehemiah Massed. Per notes, echo showed normal LVEF > 55%, mild MR, mild TR and stress test was negative for ischemia.   Limited neck ROM secondary to history of cervical fusion C2-T1.  CMP from 03/30/2021 reviewed, unremarkable.  CBC 04/21/2021 reviewed, mild anemia with hemoglobin 12.7, otherwise unremarkable.  EKG 06/30/2020: NSR.  Rate 77.  )       Anesthesia Quick Evaluation

## 2021-04-22 NOTE — Progress Notes (Signed)
Anesthesia Chart Review:  Pt had benign cardiac evaluation in 2017 after report of chest pain. Evaluated by Dr. Nehemiah Massed. Per notes, echo showed normal LVEF > 55%, mild MR, mild TR and stress test was negative for ischemia.   Limited neck ROM secondary to history of cervical fusion C2-T1.  CMP from 03/30/2021 reviewed, unremarkable.  CBC 04/21/2021 reviewed, mild anemia with hemoglobin 12.7, otherwise unremarkable.  EKG 06/30/2020: NSR.  Rate 7.    Wynonia Musty [facility] Short Stay Center/Anesthesiology Phone [phone] 04/22/2021 11:05 AM

## 2021-04-23 ENCOUNTER — Other Ambulatory Visit: Payer: Self-pay

## 2021-04-23 ENCOUNTER — Inpatient Hospital Stay (HOSPITAL_COMMUNITY): Payer: Medicare Other | Admitting: Anesthesiology

## 2021-04-23 ENCOUNTER — Inpatient Hospital Stay (HOSPITAL_COMMUNITY): Payer: Medicare Other

## 2021-04-23 ENCOUNTER — Encounter (HOSPITAL_COMMUNITY): Payer: Self-pay | Admitting: Neurological Surgery

## 2021-04-23 ENCOUNTER — Encounter (HOSPITAL_COMMUNITY)
Admission: RE | Disposition: A | Discharge status: Home / Self Care | Payer: Self-pay | Source: Home / Self Care | Attending: Neurological Surgery

## 2021-04-23 ENCOUNTER — Inpatient Hospital Stay (HOSPITAL_COMMUNITY)
Admission: RE | Admit: 2021-04-23 | Discharge: 2021-04-27 | DRG: 460 | Disposition: A | Discharge status: Home / Self Care | Payer: Medicare Other | Attending: Neurological Surgery | Admitting: Neurological Surgery

## 2021-04-23 ENCOUNTER — Inpatient Hospital Stay (HOSPITAL_COMMUNITY): Payer: Medicare Other | Admitting: Physician Assistant

## 2021-04-23 DIAGNOSIS — Z9889 Other specified postprocedural states: Secondary | ICD-10-CM | POA: Diagnosis not present

## 2021-04-23 DIAGNOSIS — M4316 Spondylolisthesis, lumbar region: Secondary | ICD-10-CM | POA: Diagnosis not present

## 2021-04-23 DIAGNOSIS — E785 Hyperlipidemia, unspecified: Secondary | ICD-10-CM | POA: Diagnosis present

## 2021-04-23 DIAGNOSIS — Z885 Allergy status to narcotic agent status: Secondary | ICD-10-CM | POA: Diagnosis not present

## 2021-04-23 DIAGNOSIS — M48061 Spinal stenosis, lumbar region without neurogenic claudication: Secondary | ICD-10-CM | POA: Diagnosis not present

## 2021-04-23 DIAGNOSIS — Z8546 Personal history of malignant neoplasm of prostate: Secondary | ICD-10-CM | POA: Diagnosis not present

## 2021-04-23 DIAGNOSIS — M47816 Spondylosis without myelopathy or radiculopathy, lumbar region: Secondary | ICD-10-CM | POA: Diagnosis present

## 2021-04-23 DIAGNOSIS — M5136 Other intervertebral disc degeneration, lumbar region: Secondary | ICD-10-CM | POA: Diagnosis present

## 2021-04-23 DIAGNOSIS — G8929 Other chronic pain: Secondary | ICD-10-CM | POA: Diagnosis present

## 2021-04-23 DIAGNOSIS — Z880 Allergy status to penicillin: Secondary | ICD-10-CM | POA: Diagnosis not present

## 2021-04-23 DIAGNOSIS — M47817 Spondylosis without myelopathy or radiculopathy, lumbosacral region: Secondary | ICD-10-CM | POA: Diagnosis present

## 2021-04-23 DIAGNOSIS — M4856XA Collapsed vertebra, not elsewhere classified, lumbar region, initial encounter for fracture: Secondary | ICD-10-CM | POA: Diagnosis not present

## 2021-04-23 DIAGNOSIS — Z981 Arthrodesis status: Secondary | ICD-10-CM

## 2021-04-23 DIAGNOSIS — F17220 Nicotine dependence, chewing tobacco, uncomplicated: Secondary | ICD-10-CM | POA: Diagnosis present

## 2021-04-23 DIAGNOSIS — Z20822 Contact with and (suspected) exposure to covid-19: Secondary | ICD-10-CM | POA: Diagnosis present

## 2021-04-23 DIAGNOSIS — Z96651 Presence of right artificial knee joint: Secondary | ICD-10-CM | POA: Diagnosis present

## 2021-04-23 DIAGNOSIS — T182XXA Foreign body in stomach, initial encounter: Secondary | ICD-10-CM | POA: Diagnosis not present

## 2021-04-23 DIAGNOSIS — M4326 Fusion of spine, lumbar region: Secondary | ICD-10-CM | POA: Diagnosis not present

## 2021-04-23 DIAGNOSIS — Z79899 Other long term (current) drug therapy: Secondary | ICD-10-CM

## 2021-04-23 DIAGNOSIS — M5137 Other intervertebral disc degeneration, lumbosacral region: Secondary | ICD-10-CM | POA: Diagnosis present

## 2021-04-23 DIAGNOSIS — Z419 Encounter for procedure for purposes other than remedying health state, unspecified: Secondary | ICD-10-CM

## 2021-04-23 HISTORY — PX: ABDOMINAL EXPOSURE: SHX5708

## 2021-04-23 HISTORY — PX: LUMBAR PERCUTANEOUS PEDICLE SCREW 2 LEVEL: SHX5561

## 2021-04-23 HISTORY — PX: ANTERIOR LUMBAR FUSION: SHX1170

## 2021-04-23 LAB — CBC
HCT: 36.2 % — ABNORMAL LOW (ref 39.0–52.0)
Hemoglobin: 11.9 g/dL — ABNORMAL LOW (ref 13.0–17.0)
MCH: 30.4 pg (ref 26.0–34.0)
MCHC: 32.9 g/dL (ref 30.0–36.0)
MCV: 92.3 fL (ref 80.0–100.0)
Platelets: 207 10*3/uL (ref 150–400)
RBC: 3.92 MIL/uL — ABNORMAL LOW (ref 4.22–5.81)
RDW: 13.1 % (ref 11.5–15.5)
WBC: 13.2 10*3/uL — ABNORMAL HIGH (ref 4.0–10.5)
nRBC: 0 % (ref 0.0–0.2)

## 2021-04-23 LAB — CREATININE, SERUM
Creatinine, Ser: 0.88 mg/dL (ref 0.61–1.24)
GFR, Estimated: >60 mL/min (ref >60)

## 2021-04-23 LAB — PREPARE RBC (CROSSMATCH)

## 2021-04-23 SURGERY — ANTERIOR LUMBAR FUSION 2 LEVELS
Anesthesia: General | Site: Spine Lumbar

## 2021-04-23 MED ORDER — DEXAMETHASONE SODIUM PHOSPHATE 10 MG/ML IJ SOLN
INTRAMUSCULAR | Status: AC
  Filled 2021-04-23: qty 1

## 2021-04-23 MED ORDER — EPHEDRINE SULFATE-NACL 50-0.9 MG/10ML-% IV SOSY
PREFILLED_SYRINGE | INTRAVENOUS | Status: DC | PRN
  Administered 2021-04-23: 10 mg via INTRAVENOUS
  Administered 2021-04-23: 5 mg via INTRAVENOUS
  Administered 2021-04-23: 15 mg via INTRAVENOUS
  Administered 2021-04-23: 5 mg via INTRAVENOUS

## 2021-04-23 MED ORDER — ACETAMINOPHEN 10 MG/ML IV SOLN
INTRAVENOUS | Status: AC
  Filled 2021-04-23: qty 100

## 2021-04-23 MED ORDER — SODIUM CHLORIDE 0.9 % IV SOLN
250.0000 mL | INTRAVENOUS | Status: DC
  Administered 2021-04-23: 250 mL via INTRAVENOUS

## 2021-04-23 MED ORDER — LACTATED RINGERS IV SOLN: INTRAVENOUS | Status: DC | PRN

## 2021-04-23 MED ORDER — DEXMEDETOMIDINE (PRECEDEX) IN NS 20 MCG/5ML (4 MCG/ML) IV SYRINGE
PREFILLED_SYRINGE | INTRAVENOUS | Status: DC | PRN
  Administered 2021-04-23: 8 ug via INTRAVENOUS

## 2021-04-23 MED ORDER — CHLORHEXIDINE GLUCONATE CLOTH 2 % EX PADS: 6.0000 | MEDICATED_PAD | Freq: Once | CUTANEOUS | Status: DC

## 2021-04-23 MED ORDER — KETAMINE HCL 50 MG/5ML IJ SOSY
PREFILLED_SYRINGE | INTRAMUSCULAR | Status: AC
  Filled 2021-04-23: qty 10

## 2021-04-23 MED ORDER — ZOLPIDEM TARTRATE 5 MG PO TABS
5.0000 mg | ORAL_TABLET | Freq: Every evening | ORAL | Status: DC | PRN
  Administered 2021-04-24 – 2021-04-26 (×4): 5 mg via ORAL
  Filled 2021-04-23 (×4): qty 1

## 2021-04-23 MED ORDER — MENTHOL 3 MG MT LOZG: 1.0000 | LOZENGE | OROMUCOSAL | Status: DC | PRN

## 2021-04-23 MED ORDER — HYDROMORPHONE HCL 1 MG/ML IJ SOLN
INTRAMUSCULAR | Status: DC | PRN
  Administered 2021-04-23: .5 mg via INTRAVENOUS

## 2021-04-23 MED ORDER — MIDAZOLAM HCL 2 MG/2ML IJ SOLN
INTRAMUSCULAR | Status: AC
  Filled 2021-04-23: qty 2

## 2021-04-23 MED ORDER — LIDOCAINE-EPINEPHRINE 1 %-1:100000 IJ SOLN
INTRAMUSCULAR | Status: AC
  Filled 2021-04-23: qty 1

## 2021-04-23 MED ORDER — PHENOL 1.4 % MT LIQD: 1.0000 | OROMUCOSAL | Status: DC | PRN

## 2021-04-23 MED ORDER — QUETIAPINE FUMARATE 100 MG PO TABS
300.0000 mg | ORAL_TABLET | Freq: Every day | ORAL | Status: DC
  Administered 2021-04-23 – 2021-04-26 (×4): 300 mg via ORAL
  Filled 2021-04-23 (×4): qty 3

## 2021-04-23 MED ORDER — ONDANSETRON HCL 4 MG/2ML IJ SOLN: 4.0000 mg | Freq: Four times a day (QID) | INTRAMUSCULAR | Status: DC | PRN

## 2021-04-23 MED ORDER — KETAMINE HCL 10 MG/ML IJ SOLN
INTRAMUSCULAR | Status: DC | PRN
  Administered 2021-04-23: 15 mg via INTRAVENOUS
  Administered 2021-04-23: 35 mg via INTRAVENOUS
  Administered 2021-04-23 (×3): 10 mg via INTRAVENOUS

## 2021-04-23 MED ORDER — ONDANSETRON HCL 4 MG PO TABS: 4.0000 mg | ORAL_TABLET | Freq: Four times a day (QID) | ORAL | Status: DC | PRN

## 2021-04-23 MED ORDER — DOCUSATE SODIUM 100 MG PO CAPS
100.0000 mg | ORAL_CAPSULE | Freq: Two times a day (BID) | ORAL | Status: DC
  Administered 2021-04-23 – 2021-04-27 (×8): 100 mg via ORAL
  Filled 2021-04-23 (×8): qty 1

## 2021-04-23 MED ORDER — ACETAMINOPHEN 325 MG PO TABS
650.0000 mg | ORAL_TABLET | ORAL | Status: DC | PRN
  Administered 2021-04-24 – 2021-04-25 (×3): 650 mg via ORAL
  Filled 2021-04-23 (×4): qty 2

## 2021-04-23 MED ORDER — SUGAMMADEX SODIUM 200 MG/2ML IV SOLN
INTRAVENOUS | Status: DC | PRN
  Administered 2021-04-23: 400 mg via INTRAVENOUS

## 2021-04-23 MED ORDER — LIDOCAINE 2% (20 MG/ML) 5 ML SYRINGE
INTRAMUSCULAR | Status: DC | PRN
  Administered 2021-04-23: 80 mg via INTRAVENOUS

## 2021-04-23 MED ORDER — HYDROMORPHONE HCL 1 MG/ML IJ SOLN
INTRAMUSCULAR | Status: AC
  Filled 2021-04-23: qty 1

## 2021-04-23 MED ORDER — ACETAMINOPHEN 500 MG PO TABS: 1000.0000 mg | ORAL_TABLET | Freq: Once | ORAL | Status: DC

## 2021-04-23 MED ORDER — HYDROMORPHONE HCL 1 MG/ML IJ SOLN
1.0000 mg | INTRAMUSCULAR | Status: DC | PRN
  Administered 2021-04-23 – 2021-04-27 (×22): 1 mg via INTRAVENOUS
  Filled 2021-04-23 (×22): qty 1

## 2021-04-23 MED ORDER — OXYCODONE HCL 5 MG PO TABS
ORAL_TABLET | ORAL | Status: AC
  Filled 2021-04-23: qty 3

## 2021-04-23 MED ORDER — SODIUM CHLORIDE 0.9% FLUSH
3.0000 mL | Freq: Two times a day (BID) | INTRAVENOUS | Status: DC
  Administered 2021-04-23 – 2021-04-27 (×8): 3 mL via INTRAVENOUS

## 2021-04-23 MED ORDER — HYDROMORPHONE HCL 1 MG/ML IJ SOLN
INTRAMUSCULAR | Status: AC
  Filled 2021-04-23: qty 0.5

## 2021-04-23 MED ORDER — BUPIVACAINE-EPINEPHRINE 0.5% -1:200000 IJ SOLN
INTRAMUSCULAR | Status: AC
  Filled 2021-04-23: qty 1

## 2021-04-23 MED ORDER — SODIUM CHLORIDE 0.9 % IV SOLN: 10.0000 mL/h | Freq: Once | INTRAVENOUS | Status: DC

## 2021-04-23 MED ORDER — ROCURONIUM BROMIDE 10 MG/ML (PF) SYRINGE
PREFILLED_SYRINGE | INTRAVENOUS | Status: AC
  Filled 2021-04-23: qty 10

## 2021-04-23 MED ORDER — TAMSULOSIN HCL 0.4 MG PO CAPS
0.4000 mg | ORAL_CAPSULE | Freq: Every day | ORAL | Status: DC
  Administered 2021-04-24 – 2021-04-27 (×4): 0.4 mg via ORAL
  Filled 2021-04-23 (×4): qty 1

## 2021-04-23 MED ORDER — PANTOPRAZOLE SODIUM 40 MG IV SOLR
40.0000 mg | Freq: Every day | INTRAVENOUS | Status: DC
  Administered 2021-04-23: 40 mg via INTRAVENOUS
  Filled 2021-04-23: qty 40

## 2021-04-23 MED ORDER — DONEPEZIL HCL 10 MG PO TABS
5.0000 mg | ORAL_TABLET | Freq: Every day | ORAL | Status: DC
  Administered 2021-04-24 – 2021-04-27 (×4): 5 mg via ORAL
  Filled 2021-04-23 (×4): qty 1

## 2021-04-23 MED ORDER — DEXMEDETOMIDINE (PRECEDEX) IN NS 20 MCG/5ML (4 MCG/ML) IV SYRINGE
PREFILLED_SYRINGE | INTRAVENOUS | Status: AC
  Filled 2021-04-23: qty 5

## 2021-04-23 MED ORDER — EPHEDRINE 5 MG/ML INJ
INTRAVENOUS | Status: AC
  Filled 2021-04-23: qty 5

## 2021-04-23 MED ORDER — THROMBIN 5000 UNITS EX SOLR
CUTANEOUS | Status: AC
  Filled 2021-04-23: qty 5000

## 2021-04-23 MED ORDER — OXYMETAZOLINE HCL 0.05 % NA SOLN
1.0000 | Freq: Every day | NASAL | Status: AC
  Administered 2021-04-23 – 2021-04-25 (×3): 1 via NASAL
  Filled 2021-04-23: qty 30

## 2021-04-23 MED ORDER — VANCOMYCIN HCL 1000 MG/200ML IV SOLN
1000.0000 mg | Freq: Once | INTRAVENOUS | Status: AC
  Administered 2021-04-23: 1000 mg via INTRAVENOUS
  Filled 2021-04-23: qty 200

## 2021-04-23 MED ORDER — FENTANYL CITRATE (PF) 250 MCG/5ML IJ SOLN
INTRAMUSCULAR | Status: AC
  Filled 2021-04-23: qty 5

## 2021-04-23 MED ORDER — ONDANSETRON HCL 4 MG/2ML IJ SOLN
INTRAMUSCULAR | Status: DC | PRN
  Administered 2021-04-23: 4 mg via INTRAVENOUS

## 2021-04-23 MED ORDER — CHLORHEXIDINE GLUCONATE 0.12 % MT SOLN
15.0000 mL | Freq: Once | OROMUCOSAL | Status: AC
  Administered 2021-04-23: 15 mL via OROMUCOSAL
  Filled 2021-04-23: qty 15

## 2021-04-23 MED ORDER — PHENYLEPHRINE HCL-NACL 20-0.9 MG/250ML-% IV SOLN
INTRAVENOUS | Status: DC | PRN
  Administered 2021-04-23: 80 ug/min via INTRAVENOUS
  Administered 2021-04-23: 30 ug/min via INTRAVENOUS

## 2021-04-23 MED ORDER — BUPIVACAINE LIPOSOME 1.3 % IJ SUSP
INTRAMUSCULAR | Status: DC | PRN
  Administered 2021-04-23: 20 mL

## 2021-04-23 MED ORDER — 0.9 % SODIUM CHLORIDE (POUR BTL) OPTIME
TOPICAL | Status: DC | PRN
  Administered 2021-04-23 (×2): 1000 mL

## 2021-04-23 MED ORDER — ACETAMINOPHEN 10 MG/ML IV SOLN
INTRAVENOUS | Status: DC | PRN
  Administered 2021-04-23: 1000 mg via INTRAVENOUS

## 2021-04-23 MED ORDER — DEXAMETHASONE SODIUM PHOSPHATE 10 MG/ML IJ SOLN
INTRAMUSCULAR | Status: DC | PRN
  Administered 2021-04-23: 10 mg via INTRAVENOUS

## 2021-04-23 MED ORDER — ROCURONIUM BROMIDE 10 MG/ML (PF) SYRINGE
PREFILLED_SYRINGE | INTRAVENOUS | Status: DC | PRN
  Administered 2021-04-23: 30 mg via INTRAVENOUS
  Administered 2021-04-23: 40 mg via INTRAVENOUS
  Administered 2021-04-23: 20 mg via INTRAVENOUS
  Administered 2021-04-23: 60 mg via INTRAVENOUS
  Administered 2021-04-23: 10 mg via INTRAVENOUS
  Administered 2021-04-23: 20 mg via INTRAVENOUS
  Administered 2021-04-23: 30 mg via INTRAVENOUS
  Administered 2021-04-23: 40 mg via INTRAVENOUS

## 2021-04-23 MED ORDER — VANCOMYCIN HCL IN DEXTROSE 1-5 GM/200ML-% IV SOLN
1000.0000 mg | INTRAVENOUS | Status: AC
  Administered 2021-04-23: 1000 mg via INTRAVENOUS
  Filled 2021-04-23: qty 200

## 2021-04-23 MED ORDER — OXYCODONE HCL 5 MG PO TABS
15.0000 mg | ORAL_TABLET | ORAL | Status: DC | PRN
  Administered 2021-04-23 – 2021-04-27 (×21): 15 mg via ORAL
  Filled 2021-04-23 (×20): qty 3

## 2021-04-23 MED ORDER — PROPOFOL 10 MG/ML IV BOLUS
INTRAVENOUS | Status: DC | PRN
  Administered 2021-04-23: 150 mg via INTRAVENOUS

## 2021-04-23 MED ORDER — PROPOFOL 10 MG/ML IV BOLUS
INTRAVENOUS | Status: AC
  Filled 2021-04-23: qty 20

## 2021-04-23 MED ORDER — ACETAMINOPHEN 650 MG RE SUPP: 650.0000 mg | RECTAL | Status: DC | PRN

## 2021-04-23 MED ORDER — KETOROLAC TROMETHAMINE 15 MG/ML IJ SOLN
7.5000 mg | Freq: Four times a day (QID) | INTRAMUSCULAR | Status: AC
  Administered 2021-04-23 – 2021-04-24 (×4): 7.5 mg via INTRAVENOUS
  Filled 2021-04-23 (×4): qty 1

## 2021-04-23 MED ORDER — AMISULPRIDE (ANTIEMETIC) 5 MG/2ML IV SOLN: 10.0000 mg | Freq: Once | INTRAVENOUS | Status: DC | PRN

## 2021-04-23 MED ORDER — FENTANYL CITRATE (PF) 250 MCG/5ML IJ SOLN
INTRAMUSCULAR | Status: DC | PRN
  Administered 2021-04-23: 50 ug via INTRAVENOUS
  Administered 2021-04-23: 100 ug via INTRAVENOUS
  Administered 2021-04-23: 200 ug via INTRAVENOUS
  Administered 2021-04-23 (×2): 50 ug via INTRAVENOUS

## 2021-04-23 MED ORDER — THROMBIN 20000 UNITS EX SOLR
CUTANEOUS | Status: AC
  Filled 2021-04-23: qty 20000

## 2021-04-23 MED ORDER — BUPIVACAINE LIPOSOME 1.3 % IJ SUSP
INTRAMUSCULAR | Status: AC
  Filled 2021-04-23: qty 20

## 2021-04-23 MED ORDER — MIDAZOLAM HCL 5 MG/5ML IJ SOLN
INTRAMUSCULAR | Status: DC | PRN
  Administered 2021-04-23: 2 mg via INTRAVENOUS

## 2021-04-23 MED ORDER — THROMBIN 5000 UNITS EX SOLR
OROMUCOSAL | Status: DC | PRN
  Administered 2021-04-23: 5 mL via TOPICAL

## 2021-04-23 MED ORDER — MELATONIN 5 MG PO TABS
20.0000 mg | ORAL_TABLET | Freq: Every day | ORAL | Status: DC
  Administered 2021-04-23 – 2021-04-26 (×4): 20 mg via ORAL
  Filled 2021-04-23 (×4): qty 4

## 2021-04-23 MED ORDER — SODIUM CHLORIDE 0.9% FLUSH: 3.0000 mL | INTRAVENOUS | Status: DC | PRN

## 2021-04-23 MED ORDER — LACTATED RINGERS IV SOLN: INTRAVENOUS | Status: DC

## 2021-04-23 MED ORDER — PHENYLEPHRINE 40 MCG/ML (10ML) SYRINGE FOR IV PUSH (FOR BLOOD PRESSURE SUPPORT)
PREFILLED_SYRINGE | INTRAVENOUS | Status: AC
  Filled 2021-04-23: qty 10

## 2021-04-23 MED ORDER — LIDOCAINE 2% (20 MG/ML) 5 ML SYRINGE
INTRAMUSCULAR | Status: AC
  Filled 2021-04-23: qty 5

## 2021-04-23 MED ORDER — HEPARIN SODIUM (PORCINE) 5000 UNIT/ML IJ SOLN
5000.0000 [IU] | Freq: Two times a day (BID) | INTRAMUSCULAR | Status: DC
  Administered 2021-04-24 – 2021-04-27 (×6): 5000 [IU] via SUBCUTANEOUS
  Filled 2021-04-23 (×6): qty 1

## 2021-04-23 MED ORDER — PHENYLEPHRINE 40 MCG/ML (10ML) SYRINGE FOR IV PUSH (FOR BLOOD PRESSURE SUPPORT)
PREFILLED_SYRINGE | INTRAVENOUS | Status: DC | PRN
  Administered 2021-04-23: 200 ug via INTRAVENOUS
  Administered 2021-04-23: 120 ug via INTRAVENOUS
  Administered 2021-04-23: 80 ug via INTRAVENOUS

## 2021-04-23 MED ORDER — ROSUVASTATIN CALCIUM 5 MG PO TABS
10.0000 mg | ORAL_TABLET | Freq: Every day | ORAL | Status: DC
  Administered 2021-04-24 – 2021-04-27 (×4): 10 mg via ORAL
  Filled 2021-04-23 (×5): qty 2

## 2021-04-23 MED ORDER — METHOCARBAMOL 750 MG PO TABS
750.0000 mg | ORAL_TABLET | Freq: Four times a day (QID) | ORAL | Status: DC | PRN
  Administered 2021-04-24 – 2021-04-27 (×9): 750 mg via ORAL
  Filled 2021-04-23 (×9): qty 1

## 2021-04-23 MED ORDER — SODIUM CHLORIDE 0.9 % IV SOLN: INTRAVENOUS | Status: DC

## 2021-04-23 MED ORDER — ONDANSETRON HCL 4 MG/2ML IJ SOLN
INTRAMUSCULAR | Status: AC
  Filled 2021-04-23: qty 2

## 2021-04-23 MED ORDER — HYDROMORPHONE HCL 1 MG/ML IJ SOLN
0.2500 mg | INTRAMUSCULAR | Status: DC | PRN
Start: 2021-04-23 — End: 2021-04-23
  Administered 2021-04-23 (×4): 0.5 mg via INTRAVENOUS

## 2021-04-23 MED ORDER — PHENYLEPHRINE HCL-NACL 20-0.9 MG/250ML-% IV SOLN
INTRAVENOUS | Status: AC
  Filled 2021-04-23: qty 250

## 2021-04-23 MED ORDER — ALBUMIN HUMAN 5 % IV SOLN: INTRAVENOUS | Status: DC | PRN

## 2021-04-23 MED ORDER — METHOCARBAMOL 1000 MG/10ML IJ SOLN
1000.0000 mg | Freq: Four times a day (QID) | INTRAVENOUS | Status: DC | PRN
  Filled 2021-04-23: qty 10

## 2021-04-23 MED ORDER — ORAL CARE MOUTH RINSE: 15.0000 mL | Freq: Once | OROMUCOSAL | Status: AC

## 2021-04-23 SURGICAL SUPPLY — 97 items
ADH SKN CLS APL DERMABOND .7 (GAUZE/BANDAGES/DRESSINGS) ×4
ANCH SPNL 25 LMBR MIS (Anchor) ×6 IMPLANT
ANCHOR LUMBAR 25 MIS (Anchor) ×3 IMPLANT
APPLIER CLIP 11 MED OPEN (CLIP) ×3
APR CLP MED 11 20 MLT OPN (CLIP) ×2
BAG BANDED W/RUBBER/TAPE 36X54 (MISCELLANEOUS) ×4 IMPLANT
BAG COUNTER SPONGE SURGICOUNT (BAG) ×6 IMPLANT
BAG EQP BAND 135X91 W/RBR TAPE (MISCELLANEOUS)
BAG SPNG CNTER NS LX DISP (BAG) ×4
CLIP APPLIE 11 MED OPEN (CLIP) ×2 IMPLANT
CLIP LIGATING EXTRA MED SLVR (CLIP) ×2 IMPLANT
COVER BACK TABLE 60X90IN (DRAPES) ×3 IMPLANT
COVER MAYO STAND STRL (DRAPES) ×3 IMPLANT
DECANTER SPIKE VIAL GLASS SM (MISCELLANEOUS) ×3 IMPLANT
DERMABOND ADVANCED (GAUZE/BANDAGES/DRESSINGS) ×2
DERMABOND ADVANCED .7 DNX12 (GAUZE/BANDAGES/DRESSINGS) IMPLANT
DRAIN JACKSON RD 7FR 3/32 (WOUND CARE) IMPLANT
DRAPE C-ARM 42X72 X-RAY (DRAPES) ×6 IMPLANT
DRAPE LAPAROTOMY 100X72X124 (DRAPES) ×4 IMPLANT
DRAPE SURG 17X23 STRL (DRAPES) ×3 IMPLANT
DRSG OPSITE POSTOP 4X6 (GAUZE/BANDAGES/DRESSINGS) ×2 IMPLANT
DRSG OPSITE POSTOP 4X8 (GAUZE/BANDAGES/DRESSINGS) ×1 IMPLANT
DURAPREP 26ML APPLICATOR (WOUND CARE) ×4 IMPLANT
ELECT BLADE 4.0 EZ CLEAN MEGAD (MISCELLANEOUS) ×3
ELECT BLADE INSULATED 4IN (ELECTROSURGICAL) ×3
ELECT REM PT RETURN 9FT ADLT (ELECTROSURGICAL) ×3
ELECTRODE BLADE INSULATED 4IN (ELECTROSURGICAL) ×2 IMPLANT
ELECTRODE BLDE 4.0 EZ CLN MEGD (MISCELLANEOUS) ×2 IMPLANT
ELECTRODE REM PT RTRN 9FT ADLT (ELECTROSURGICAL) ×2 IMPLANT
EXTENDER TAB GUIDE SV 5.5/6.0 (INSTRUMENTS) ×12 IMPLANT
GAUZE 4X4 16PLY ~~LOC~~+RFID DBL (SPONGE) ×1 IMPLANT
GAUZE SPONGE 4X4 12PLY STRL (GAUZE/BANDAGES/DRESSINGS) ×3 IMPLANT
GAUZE SPONGE 4X4 16PLY XRAY LF (GAUZE/BANDAGES/DRESSINGS) ×3 IMPLANT
GLOVE SRG 8 PF TXTR STRL LF DI (GLOVE) ×6 IMPLANT
GLOVE SURG ENC MOIS LTX SZ7.5 (GLOVE) ×5 IMPLANT
GLOVE SURG LTX SZ8 (GLOVE) ×6 IMPLANT
GLOVE SURG MICRO LTX SZ7.5 (GLOVE) ×3 IMPLANT
GLOVE SURG UNDER POLY LF SZ6.5 (GLOVE) ×3 IMPLANT
GLOVE SURG UNDER POLY LF SZ7 (GLOVE) ×4 IMPLANT
GLOVE SURG UNDER POLY LF SZ8 (GLOVE) ×9
GOWN STRL REUS W/ TWL LRG LVL3 (GOWN DISPOSABLE) IMPLANT
GOWN STRL REUS W/ TWL XL LVL3 (GOWN DISPOSABLE) ×4 IMPLANT
GOWN STRL REUS W/TWL 2XL LVL3 (GOWN DISPOSABLE) IMPLANT
GOWN STRL REUS W/TWL LRG LVL3 (GOWN DISPOSABLE) ×12
GOWN STRL REUS W/TWL XL LVL3 (GOWN DISPOSABLE) ×6
GRAFT TRIN ELITE MED MUSC TRAN (Graft) ×1 IMPLANT
GRAFT TRINITY ELITE LGE HUMAN (Tissue) ×1 IMPLANT
GUIDEWIRE SHARP NITINOL 610 (WIRE) ×6 IMPLANT
HEMOSTAT POWDER KIT SURGIFOAM (HEMOSTASIS) ×1 IMPLANT
INSERT FOGARTY 61MM (MISCELLANEOUS) IMPLANT
INSERT FOGARTY SM (MISCELLANEOUS) IMPLANT
INTRODUCER SYS OSTEO KYPHX 1 (INTRODUCER) IMPLANT
KIT BASIN OR (CUSTOM PROCEDURE TRAY) ×3 IMPLANT
KIT POSITION SURG JACKSON T1 (MISCELLANEOUS) ×3 IMPLANT
KIT TURNOVER KIT B (KITS) ×3 IMPLANT
MARKER SKIN DUAL TIP RULER LAB (MISCELLANEOUS) ×4 IMPLANT
NDL ASPIRATION RAN815N 8X15 (NEEDLE) IMPLANT
NDL HYPO 25X1 1.5 SAFETY (NEEDLE) ×2 IMPLANT
NDL SPNL 22GX3.5 QUINCKE BK (NEEDLE) ×2 IMPLANT
NEEDLE ASPIRATION RAN815N 8X15 (NEEDLE) ×2 IMPLANT
NEEDLE ASPIRATION RANFAC 8X15 (NEEDLE) ×1
NEEDLE HYPO 25X1 1.5 SAFETY (NEEDLE) ×6 IMPLANT
NEEDLE SPNL 22GX3.5 QUINCKE BK (NEEDLE) ×3 IMPLANT
NS IRRIG 1000ML POUR BTL (IV SOLUTION) ×4 IMPLANT
PACK LAMINECTOMY NEURO (CUSTOM PROCEDURE TRAY) ×4 IMPLANT
PAD ARMBOARD 7.5X6 YLW CONV (MISCELLANEOUS) ×15 IMPLANT
PATTIES SURGICAL .5 X.5 (GAUZE/BANDAGES/DRESSINGS) IMPLANT
PATTIES SURGICAL .5 X1 (DISPOSABLE) IMPLANT
PATTIES SURGICAL 1X1 (DISPOSABLE) IMPLANT
ROD PERC CCM 5.5X70 (Rod) ×2 IMPLANT
SCREW 5.5 VOYAGER MAS 7.5X50 (Screw) ×2 IMPLANT
SCREW ANT CERV SD IND 5.5X25 (Screw) ×2 IMPLANT
SCREW MAS VOYAGER 7.5X45 (Screw) ×4 IMPLANT
SCREW SET 5.5/6.0MM SOLERA (Screw) ×6 IMPLANT
SPACER HEDRON IA 29X39 15 15D (Spacer) ×1 IMPLANT
SPACER HEDRON IA 29X39X15 8D (Spacer) ×1 IMPLANT
SPONGE INTESTINAL PEANUT (DISPOSABLE) ×4 IMPLANT
SPONGE T-LAP 18X18 ~~LOC~~+RFID (SPONGE) ×3 IMPLANT
SPONGE T-LAP 4X18 ~~LOC~~+RFID (SPONGE) ×5 IMPLANT
SUT PDS AB 1 CTX 36 (SUTURE) ×3 IMPLANT
SUT PROLENE 4 0 RB 1 (SUTURE)
SUT PROLENE 4-0 RB1 .5 CRCL 36 (SUTURE) IMPLANT
SUT PROLENE 5 0 CC1 (SUTURE) IMPLANT
SUT PROLENE 6 0 C 1 30 (SUTURE) ×2 IMPLANT
SUT PROLENE 6 0 CC (SUTURE) IMPLANT
SUT SILK 0 TIES 10X30 (SUTURE) ×2 IMPLANT
SUT SILK 2 0 TIES 10X30 (SUTURE) ×3 IMPLANT
SUT SILK 2 0SH CR/8 30 (SUTURE) IMPLANT
SUT SILK 3 0 TIES 17X18 (SUTURE)
SUT SILK 3 0SH CR/8 30 (SUTURE) IMPLANT
SUT SILK 3-0 18XBRD TIE BLK (SUTURE) ×2 IMPLANT
SUT VIC AB 2-0 CP2 18 (SUTURE) ×7 IMPLANT
SUT VIC AB 3-0 SH 8-18 (SUTURE) ×4 IMPLANT
TOWEL GREEN STERILE (TOWEL DISPOSABLE) ×3 IMPLANT
TOWEL GREEN STERILE FF (TOWEL DISPOSABLE) ×1 IMPLANT
TRAY FOLEY MTR SLVR 16FR STAT (SET/KITS/TRAYS/PACK) ×3 IMPLANT
WATER STERILE IRR 1000ML POUR (IV SOLUTION) ×3 IMPLANT

## 2021-04-23 NOTE — H&P (Signed)
History and Physical Interval Note:  04/23/2021 7:29 AM  Mike Farley  has presented today for surgery, with the diagnosis of Spondylosis of lumbar region without myelopathy or radiculopathy.  The various methods of treatment have been discussed with the patient and family. After consideration of risks, benefits and other options for treatment, the patient has consented to  Procedure(s): L4-5; L5-S1 ALIF, percutaneous pedicle screws L4-S1 (N/A) LUMBAR PERCUTANEOUS PEDICLE SCREW 2 LEVEL (N/A) ABDOMINAL EXPOSURE (N/A) as a surgical intervention.  The patient's history has been reviewed, patient examined, no change in status, stable for surgery.  I have reviewed the patient's chart and labs.  Questions were answered to the patient's satisfaction.    L4-L5, L5-S1 ALIF.  Risks and benefits again discussed.  Mike Farley  Patient name: Mike Farley     MRN: [id number]        DOB: 1950-12-26            Sex: male   REASON FOR CONSULT: Evaluate for L4-L5 and L5-S1 ALIF   HPI: Mike Farley is a 70 y.o. male, with history of hyperlipidemia and prostate cancer who presents for evaluation of L4-L5 and L5-S1 ALIF.  He describes at least 8 months of severe lower back pain.  He has failed conservative management.  Ultimately has been evaluated by neurosurgery by Dr. Vertell Limber and he has recommended a two-level anterior approach at L4-L5 and L5-S1 and vascular surgery was asked to assist with exposure.  He states his previous abdominal surgeries include laparoscopic cholecystectomy.  He is a retired Community education officer.       Past Medical History:  Diagnosis Date   Anxiety     Arthritis      shoulder   Cancer ([place])      prostate ca - Md just watching   Cervical pain (neck)      limited mobility   Chronic pain     Depression      years ago (minor)   HLD (hyperlipidemia)     Hx of cold sores     Mild cognitive impairment with memory loss             Past Surgical History:   Procedure Laterality Date   ANTERIOR FUSION CERVICAL SPINE   2011    4-5 levels   APPENDECTOMY       BONE EXCISION Left 09/28/2017    Procedure: BONE EXCISION-TAILORS  EXOSTECTOMY;  Surgeon: Samara Deist, DPM;  Location: [place];  Service: Podiatry;  Laterality: Left;  IVA LOCAL   CERVICAL DISCECTOMY   2008    C2-C7 Fused    CHOLECYSTECTOMY       COLONOSCOPY   2020   ELBOW FRACTURE SURGERY Left 2004   METATARSAL HEAD EXCISION Left 07/04/2020    Procedure: METATARSAL HEAD EXCISION;  Surgeon: Samara Deist, DPM;  Location: [facility];  Service: Podiatry;  Laterality: Left;   PROSTATE BIOPSY N/A 03/15/2019    Procedure: PROSTATE BIOPSY Uro Nav;  Surgeon: Royston Cowper, MD;  Location: [facility];  Service: Urology;  Laterality: N/A;   RADIOLOGY WITH ANESTHESIA N/A 04/22/2020    Procedure: MRI WITH ANESTHESIA BRAIN WITHOUT CONTRAST;  Surgeon: Radiologist, Medication, MD;  Location: [place] [facility];  Service: Radiology;  Laterality: N/A;   SHOULDER SURGERY Right     TONSILLECTOMY AND ADENOIDECTOMY       TOTAL KNEE ARTHROPLASTY Right 2005    x 4           Family  History  Problem Relation Age of Onset   Arthritis Mother     Cancer Mother          colon & breast cnacer   Mental illness Mother          Depression & bipolar   Cancer Father          lung & prostate cancer   Alcohol abuse Brother     Kidney disease Brother        SOCIAL HISTORY: Social History         Socioeconomic History   Marital status: Married      Spouse name: Not on file   Number of children: Not on file   Years of education: Not on file   Highest education level: Not on file  Occupational History   Not on file  Tobacco Use   Smoking status: Former   Smokeless tobacco: Current      Types: Chew   Tobacco comments:      Quit yrs ago  Vaping Use   Vaping Use: Never used  Substance and Sexual Activity   Alcohol use: Yes      Alcohol/week: 7.0 standard drinks      Types: 7 Standard drinks or  equivalent per week      Comment: beer/wine   Drug use: Never   Sexual activity: Not Currently  Other Topics Concern   Not on file  Social History Narrative   Not on file    Social Determinants of Health       Financial Resource Strain: Low Risk    Difficulty of Paying Living Expenses: Not hard at all  Food Insecurity: No Food Insecurity   Worried About Charity fundraiser in the Last Year: Never true   [place] in the Last Year: Never true  Transportation Needs: No Transportation Needs   Lack of Transportation (Medical): No   Lack of Transportation (Non-Medical): No  Physical Activity: Not on file  Stress: No Stress Concern Present   Feeling of Stress : Not at all  Social Connections: Unknown   Frequency of Communication with Friends and Family: Not on file   Frequency of Social Gatherings with Friends and Family: Not on file   Attends Religious Services: Not on file   Active Member of Clubs or Organizations: Not on file   Attends Archivist Meetings: Not on file   Marital Status: Married  Human resources officer Violence: Not At Risk   Fear of Current or Ex-Partner: No   Emotionally Abused: No   Physically Abused: No   Sexually Abused: No           Allergies  Allergen Reactions   Morphine Itching and Rash   Amoxicillin        REACTION: abdominal distention and churning pain in abdomen. Did it involve swelling of the face/tongue/throat, SOB, or low BP? No Did it involve sudden or severe rash/hives, skin peeling, or any reaction on the inside of your mouth or nose? No Did you need to seek medical attention at a hospital or doctor's office? No When did it last happen?      40+ years ago If all above answers are "NO", may proceed with cephalosporin use.              Current Outpatient Medications  Medication Sig Dispense Refill   cholecalciferol (VITAMIN D3) 25 MCG (1000 UT) tablet Take 1,000 Units by mouth 3 (three) times a week.  cyanocobalamin  1000 MCG tablet Take 1,000 mcg by mouth daily.        donepezil (ARICEPT) 5 MG tablet Take 5 mg by mouth daily.       Melatonin 10 MG TABS Take 30 mg by mouth at bedtime as needed (sleep).       Multiple Vitamin (MULTIVITAMIN) tablet Take 1 tablet by mouth daily. ABC Complete MEN       oxyCODONE (ROXICODONE) 15 MG immediate release tablet Take 15 mg by mouth 4 (four) times daily.       oxymetazoline (AFRIN) 0.05 % nasal spray Place 1 spray into both nostrils at bedtime.       QUEtiapine (SEROQUEL) 300 MG tablet TAKE ONE TABLET AT BEDTIME. 90 tablet 1   rosuvastatin (CRESTOR) 10 MG tablet TAKE 1 TABLET BY MOUTH DAILY 30 tablet 5   tamsulosin (FLOMAX) 0.4 MG CAPS capsule Take 0.4 mg by mouth daily.       mupirocin ointment (BACTROBAN) 2 % Apply 1 application topically 2 (two) times daily. 22 g 0   valACYclovir (VALTREX) 500 MG tablet Take 1 tablet (500 mg total) by mouth 2 (two) times daily. (Patient taking differently: Take 500 mg by mouth daily as needed (Cold sores).) 20 tablet 0    No current facility-administered medications for this visit.      REVIEW OF SYSTEMS:  [X]  denotes positive finding, [ ]  denotes negative finding Cardiac   Comments:  Chest pain or chest pressure:      Shortness of breath upon exertion:      Short of breath when lying flat:      Irregular heart rhythm:             Vascular      Pain in calf, thigh, or hip brought on by ambulation:      Pain in feet at night that wakes you up from your sleep:       Blood clot in your veins:      Leg swelling:              Pulmonary      Oxygen at home:      Productive cough:       Wheezing:              Neurologic      Sudden weakness in arms or legs:       Sudden numbness in arms or legs:       Sudden onset of difficulty speaking or slurred speech:      Temporary loss of vision in one eye:       Problems with dizziness:              Gastrointestinal      Blood in stool:       Vomited blood:               Genitourinary      Burning when urinating:       Blood in urine:             Psychiatric      Major depression:              Hematologic      Bleeding problems:      Problems with blood clotting too easily:             Skin      Rashes or ulcers:  Constitutional      Fever or chills:          PHYSICAL EXAM:    Vitals:    03/31/21 0858  BP: 127/75  Pulse: 79  Resp: 16  Temp: 97.9 F (36.6 C)  TempSrc: Temporal  SpO2: 94%  Weight: 166 lb (75.3 kg)  Height: 5\' 9"  (1.753 m)      GENERAL: The patient is a well-nourished male, in no acute distress. The vital signs are documented above. CARDIAC: There is a regular rate and rhythm.  VASCULAR:  Palpable femoral pulses bilaterally Palpable PT pulses bilaterally PULMONARY: No respiratory distress ABDOMEN: Soft and non-tender.  Well-healed previous laparoscopic incisions from cholecystectomy MUSCULOSKELETAL: There are no major deformities or cyanosis. NEUROLOGIC: No focal weakness or paresthesias are detected. SKIN: There are no ulcers or rashes noted. PSYCHIATRIC: The patient has a normal affect.   DATA:    MRI lumbar spine reviewed from 02/22/2021 and the aortic bifurcation is at the L4-L5 disc space with the vein bifurcation at the top of L5.   Assessment/Plan:   70 year old male with chronic lower back pain who presents for evaluation of two-level ALIF at L4-L5 and L5-S1.  I discussed my role as a vascular surgeon for abdominal exposure at these levels.  Discussed paramedian incision over the left rectus muscle with mobilization of the rectus muscle and entering the retroperitoneum and moving the peritoneum and left ureter as well as the iliac artery and iliac vein to get to the L4-L5 and L5-S1 disc space.  We talked about risk and injury to the above structures.  I have reviewed his recent MRI and I think he will be a good candidate for anterior approach.  Questions answered.  Look forward to assisting Dr.  Reatha Armour on 04/23/2021.     Mike Heck, MD [facility] Office: [phone]

## 2021-04-23 NOTE — Progress Notes (Signed)
   Providing Compassionate, Quality Care - Together  NEUROSURGERY PROGRESS NOTE   S: No issues, appropriate LBP, denies leg pain  O: EXAM:  BP (!) 153/85 (BP Location: Right Arm)   Pulse 81   Temp 97.7 F (36.5 C) (Oral)   Resp 12   Ht 5\' 10"  (1.778 m)   Wt 75.8 kg   SpO2 99%   BMI 23.96 kg/m   Awake, alert, oriented x3 PERRL Speech fluent, appropriate  CNs grossly intact  5/5 BUE/BLE  Abd soft Dressings c/d/I BLE palpable DP  ASSESSMENT:  70 y.o. male with   L4-S1 Spondylosis S/p L4-S1 ALIF, with posterior percutaneous instrumentation on 04/23/2021  PLAN: - pt/ot - pain control, on regimen recs from pain management physician - cont pulse ox    Thank you for allowing me to participate in this patient's care.  Please do not hesitate to call with questions or concerns.   Elwin Sleight, [facility] Cell: [phone]

## 2021-04-23 NOTE — Anesthesia Postprocedure Evaluation (Signed)
Anesthesia Post Note  Patient: Mike Farley  Procedure(s) Performed: Lumbar four-five,  Lumbar five-Sacral one Anterior Lumbar Interbody Fusion (Spine Lumbar) LUMBAR PERCUTANEOUS PEDICLE SCREW LUMBAR FOUR-SACRAL ONE (Spine Lumbar) ABDOMINAL EXPOSURE     Patient location during evaluation: PACU Anesthesia Type: General Level of consciousness: awake and alert Pain management: pain level controlled Vital Signs Assessment: post-procedure vital signs reviewed and stable Respiratory status: spontaneous breathing, nonlabored ventilation, respiratory function stable and patient connected to nasal cannula oxygen Cardiovascular status: blood pressure returned to baseline and stable Postop Assessment: no apparent nausea or vomiting Anesthetic complications: no   No notable events documented.  Last Vitals:  Vitals:   04/23/21 1538 04/23/21 1553  BP: 100/65 113/65  Pulse: 71 69  Resp: 13 12  Temp:  (!) 36.1 C  SpO2: 93% 97%    Last Pain:  Vitals:   04/23/21 1500  TempSrc:   PainSc: 10-Worst pain ever                 Tiajuana Amass

## 2021-04-23 NOTE — H&P (Signed)
Providing Compassionate, Quality Care - Together  NEUROSURGERY HISTORY & PHYSICAL   Mike Farley is an 70 y.o. male.   Chief Complaint: Chronic low back pain HPI: This is a 70 year old male with a history of chronic low back pain for many years, its been progressively worsening to significantly alter his lifestyle over the past 9 to 10 months.  He had failed multiple conservative measures including pain control, epidural steroid injections and therapy.  MRI had revealed severe degenerative disc disease and facet arthropathy at L4-5 and L5-S1.  He presents today for ALIF L4-5, L5-S1 with posterior percutaneous pedicle screw instrumentation.  Past Medical History:  Diagnosis Date   Anxiety    Arthritis    shoulder   Cancer ([place])    prostate ca - Md just watching   Cervical pain (neck)    limited mobility   Chronic pain    Depression    years ago (minor)   HLD (hyperlipidemia)    Hx of cold sores    Mild cognitive impairment with memory loss     Past Surgical History:  Procedure Laterality Date   ANTERIOR FUSION CERVICAL SPINE  2011   4-5 levels   APPENDECTOMY     BACK SURGERY     BONE EXCISION Left 09/28/2017   Procedure: BONE EXCISION-TAILORS  EXOSTECTOMY;  Surgeon: Samara Deist, DPM;  Location: [place];  Service: Podiatry;  Laterality: Left;  IVA LOCAL   CERVICAL DISCECTOMY  2008   C2-C7 Fused    CHOLECYSTECTOMY     COLONOSCOPY  2020   ELBOW FRACTURE SURGERY Left 2004   JOINT REPLACEMENT     right knee   METATARSAL HEAD EXCISION Left 07/04/2020   Procedure: METATARSAL HEAD EXCISION;  Surgeon: Samara Deist, DPM;  Location: [facility];  Service: Podiatry;  Laterality: Left;   PROSTATE BIOPSY N/A 03/15/2019   Procedure: PROSTATE BIOPSY Uro Nav;  Surgeon: Royston Cowper, MD;  Location: [facility];  Service: Urology;  Laterality: N/A;   RADIOLOGY WITH ANESTHESIA N/A 04/22/2020   Procedure: MRI WITH ANESTHESIA BRAIN WITHOUT CONTRAST;  Surgeon:  Radiologist, Medication, MD;  Location: [place];  Service: Radiology;  Laterality: N/A;   SHOULDER SURGERY Right    TONSILLECTOMY AND ADENOIDECTOMY     TOTAL KNEE ARTHROPLASTY Right 2005   x 4    Family History  Problem Relation Age of Onset   Arthritis Mother    Cancer Mother        colon & breast cnacer   Mental illness Mother        Depression & bipolar   Cancer Father        lung & prostate cancer   Alcohol abuse Brother    Kidney disease Brother    Social History:  reports that he has quit smoking. His smokeless tobacco use includes chew. He reports current alcohol use of about 7.0 standard drinks per week. He reports that he does not use drugs.  Allergies:  Allergies  Allergen Reactions   Morphine Itching and Rash   Amoxicillin     REACTION: abdominal distention and churning pain in abdomen. Did it involve swelling of the face/tongue/throat, SOB, or low BP? No Did it involve sudden or severe rash/hives, skin peeling, or any reaction on the inside of your mouth or nose? No Did you need to seek medical attention at a hospital or doctor's office? No When did it last happen?      40+ years ago If all  above answers are "NO", may proceed with cephalosporin use.     Medications Prior to Admission  Medication Sig Dispense Refill   cholecalciferol (VITAMIN D3) 25 MCG (1000 UT) tablet Take 1,000 Units by mouth 3 (three) times a week.      cyanocobalamin 1000 MCG tablet Take 1,000 mcg by mouth daily.      donepezil (ARICEPT) 5 MG tablet Take 5 mg by mouth daily.     Melatonin 10 MG TABS Take 20 mg by mouth at bedtime.     methocarbamol (ROBAXIN) 500 MG tablet Take 500 mg by mouth 3 (three) times daily.     Multiple Vitamin (MULTIVITAMIN) tablet Take 1 tablet by mouth daily. ABC Complete MEN     oxyCODONE (ROXICODONE) 15 MG immediate release tablet Take 15 mg by mouth every 4 (four) hours as needed for pain.     oxymetazoline (AFRIN) 0.05 % nasal spray Place 1 spray into both  nostrils at bedtime.     QUEtiapine (SEROQUEL) 300 MG tablet TAKE ONE TABLET AT BEDTIME. 90 tablet 1   rosuvastatin (CRESTOR) 10 MG tablet TAKE 1 TABLET BY MOUTH DAILY 30 tablet 5   tamsulosin (FLOMAX) 0.4 MG CAPS capsule Take 0.4 mg by mouth daily.     clindamycin (CLEOCIN) 300 MG capsule Take 600 mg by mouth See admin instructions. Take 600 mg by mouth prior to dental procedures     mupirocin ointment (BACTROBAN) 2 % Apply 1 application topically 2 (two) times daily. (Patient not taking: Reported on 04/15/2021) 22 g 0   valACYclovir (VALTREX) 500 MG tablet Take 1 tablet (500 mg total) by mouth 2 (two) times daily. (Patient not taking: Reported on 04/15/2021) 20 tablet 0    Results for orders placed or performed during the hospital encounter of 04/21/21 (from the past 48 hour(s))  CBC per protocol     Status: Abnormal   Collection Time: 04/21/21  2:21 PM  Result Value Ref Range   WBC 7.7 4.0 - 10.5 K/uL   RBC 4.15 (L) 4.22 - 5.81 MIL/uL   Hemoglobin 12.7 (L) 13.0 - 17.0 g/dL   HCT 38.5 (L) 39.0 - 52.0 %   MCV 92.8 80.0 - 100.0 fL   MCH 30.6 26.0 - 34.0 pg   MCHC 33.0 30.0 - 36.0 g/dL   RDW 12.9 11.5 - 15.5 %   Platelets 244 150 - 400 K/uL   nRBC 0.0 0.0 - 0.2 %    Comment: Performed at [facility], [place] [address] [place], [place] [address]  SARS CORONAVIRUS 2 (TAT 6-24 HRS) Nasopharyngeal Nasopharyngeal Swab     Status: None   Collection Time: 04/21/21  2:21 PM   Specimen: Nasopharyngeal Swab  Result Value Ref Range   SARS Coronavirus 2 NEGATIVE NEGATIVE    Comment: (NOTE) SARS-CoV-2 target nucleic acids are NOT DETECTED.  The SARS-CoV-2 RNA is generally detectable in upper and lower respiratory specimens during the acute phase of infection. Negative results do not preclude SARS-CoV-2 infection, do not rule out co-infections with other pathogens, and should not be used as the sole basis for treatment or other patient management decisions. Negative results must be  combined with clinical observations, patient history, and epidemiological information. The expected result is Negative.  Fact Sheet for Patients: SugarRoll.be  Fact Sheet for Healthcare Providers: https://www.woods-mathews.com/  This test is not yet approved or cleared by the [place] FDA and  has been authorized for detection and/or diagnosis of SARS-CoV-2 by FDA under an  Emergency Use Authorization (EUA). This EUA will remain  in effect (meaning this test can be used) for the duration of the COVID-19 declaration under Se ction 564(b)(1) of the Act, 21 U.S.C. section 360bbb-3(b)(1), unless the authorization is terminated or revoked sooner.  Performed at [facility], [place] [address], [place], [place] [address]   Surgical pcr screen     Status: None   Collection Time: 04/21/21  2:21 PM   Specimen: Nasal Mucosa; Nasal Swab  Result Value Ref Range   MRSA, PCR NEGATIVE NEGATIVE   Staphylococcus aureus NEGATIVE NEGATIVE    Comment: (NOTE) The Xpert SA Assay (FDA approved for NASAL specimens in patients 12 years of age and older), is one component of a comprehensive surveillance program. It is not intended to diagnose infection nor to guide or monitor treatment. Performed at [facility], [place] [address] [place], [place] [address]   Type and screen [place]     Status: None   Collection Time: 04/21/21  2:41 PM  Result Value Ref Range   ABO/RH(D) O POS    Antibody Screen NEG    Sample Expiration 05/05/2021,2359    Extend sample reason      NO TRANSFUSIONS OR PREGNANCY IN THE PAST 3 MONTHS Performed at [facility], [place] [address], [place], [place] [address]    No results found.  ROS All positives and negatives are listed in HPI above  Blood pressure (!) 156/67, pulse 74, temperature 99 F (37.2 C), temperature source Oral, resp. rate 18, height 5\' 10"  (1.778 m), weight 75.8 kg, SpO2 100  %. Physical Exam  Awake alert oriented x3 PERRLA EOMI Cranial nerves II through XII intact Bilateral upper extremity 5/5 Bilateral lower extremity 5/5 Sensory intact light touch  Assessment/Plan 70 year old male with  L4-S1 degenerative disc disease, lumbar spondylosis with chronic low back pain  -OR today for ALIF L4-S1 with posterior percutaneous pedicle screw instrumentation.  We discussed all risks, benefits and expected outcomes of both approaches and procedures.  He has been evaluated by Dr. Carlis Abbott for an anterior approach.  I answered all of his questions.  He would like to proceed with surgical intervention.   Thank you for allowing me to participate in this patient's care.  Please do not hesitate to call with questions or concerns.   Elwin Sleight, [facility] Cell: [phone]

## 2021-04-23 NOTE — Op Note (Signed)
Date: April 23, 2021  Preoperative diagnosis: Chronic lower back pain  Postoperative diagnosis: Same  Procedure: Anterior spine exposure at the L4-L5 and L5-S1 disc space via anterior retroperitoneal approach for L4-L5 and L5-S1 ALIF  Surgeon: Dr. [name], MD  Co-surgeon: Dr. Elwin Sleight, DO  Indications: Patient is a 70 year old male with chronic lower back pain who has been evaluated by Dr. Reatha Armour after failing conservative management.  It has been recommended for a two-level anterior approach at L4-L5 and L5-S1.  Vascular surgery was asked to assist with abdominal exposure after risk benefits discussed.  Findings: Paramedian incision over the left rectus muscle and then the left rectus muscle was mobilized to the midline after opening the anterior rectus sheath.  I opened some of the posterior rectus sheath above arcuate line.  Entered the retroperitoneum and mobilized peritoneum and left ureter across midline.  Initially went to the L5-S1 disc space and the middle sacral vessels were divided between clips to expose the L5-S1 disc space.  I then went lateral to the left iliac vessels and mobilized the left iliac artery to exposure to left iliac vein and ligated several iliolumbar branches and mobilized and exposed L4-L5 from an anterior approach.  Anesthesia: General  Details: Patient was taken to the operating room after informed consent was obtained.  Placed on the operative table in supine position.  General endotracheal anesthesia was induced.  Fluoroscopic C-arm was brought in the lateral position and the L4-L5 and L5-S1 disc space were marked over the left rectus muscle.  At that point in time, the abdominal wall was prepped and draped in standard sterile fashion.  Preop timeout was performed.  Antibiotics were given.  Initially made a paramedian incision over the left rectus muscle.  Dissected down with Bovie cautery and opened the subcutaneous tissue with Bovie cautery and  used cerebellar retractors for added visualization.  Anterior rectus sheath was then opened longitudinally with Bovie cautery.  I raised flaps underneath the anterior rectus sheath and the rectus muscle was mobilized to the midline.  I then dissected peritoneum off the posterior rectus sheath and the posterior rectus sheath above arcuate line was opened with Metzenbaum scissors.  Peritoneum and left ureter mobilized out of the retroperitoneum toward the midline.  I initially went to the L5-S1 disc space.  Balfour retractor with a wet lap was placed in the wound.  I used the hand-held Wiley retractors to pull the peritoneum and left ureter to the midline.  I then bluntly mobilized on both sides of the disc space at L5-S1 including mobilizing the left iliac vein and then divided the middle sacral vessels between clips.  I then went lateral to the iliac vessels and pulled these to the midline again mobilizing the iliac artery until I saw the left iliac vein and then two iliolumbar vessels were ligated between 2-0 silk ties and divided.  I fully mobilized left iliac vein across midline to expose the L4-L5 disc space.  I initially placed retractors at L5-S1 with 150 reverse lips on each side of the disc space and a fixed malleable retractor cranial and a spinal needle was placed in the disc space and we confirmed we were at the correct level at L5-S1 on lateral fluorscopy.  Case was turned over to Dr. Reatha Armour.  Please see his dictation.  I was called back to the room after the L5-S1 implant was completed anteriorly.  I removed all the retractors at the L5-S1 disc level.  We then  used hand-held Wiley retractors to pull the left iliac artery and vein across midline where this had been previously mobilized to expose the L4-L5 disc space.  I placed 150 reverse lip retractors on each side of the disc space and then the disc was nicely exposed using the fixed Thompson retractor.  A needle was placed in the disc space and we  confirmed on lateral fluoroscopy we were at the correct level at L4-L5.  Case was turned over to Dr. Reatha Armour.  Complication: None  Condition: Stable  [name], MD [facility] Office: [place]

## 2021-04-23 NOTE — Anesthesia Procedure Notes (Signed)
Procedure Name: Intubation Date/Time: 04/23/2021 8:59 AM Performed by: Vonna Drafts, CRNA Pre-anesthesia Checklist: Patient identified, Emergency Drugs available, Suction available and Patient being monitored Patient Re-evaluated:Patient Re-evaluated prior to induction Oxygen Delivery Method: Circle system utilized Preoxygenation: Pre-oxygenation with 100% oxygen Induction Type: IV induction Ventilation: Mask ventilation without difficulty Laryngoscope Size: Glidescope and 4 Grade View: Grade I Tube type: Oral Tube size: 7.5 mm Number of attempts: 1 Airway Equipment and Method: Stylet and Oral airway Placement Confirmation: ETT inserted through vocal cords under direct vision, positive ETCO2 and breath sounds checked- equal and bilateral Secured at: 23 cm Tube secured with: Tape Dental Injury: Teeth and Oropharynx as per pre-operative assessment  Difficulty Due To: Difficulty was anticipated

## 2021-04-23 NOTE — Op Note (Signed)
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   POSTOP DIAGNOSIS: Same  PROCEDURE: Stage 1:  Anterior lumbar discectomy and interbody arthrodesis, L4-5, L5-S1; anterior retroperitoneal approach Placement of anterior biomechanical device, L4-5, L5-S1 Globus titanium interbody device (L4-5: 85mm 8 deg lordosis, L5-S1: 72mm 15 deg lordosis)  Placement of anterior hardware, L4-5 23mm anchors x3, L5-S1 73mm screws x2 Intraoperative use of allograft Intraoperative use of fluoroscopy  Stage 2:  Posterior segmental pedicle screw instrumentation, L4, L5, S1; medtronic solera (L4: 7.5 x54mm x2, L4: 7.5 x 32mm x2, S1: 7.5x25mm x2) Intraoperative use of fluoroscopy  SURGEON: Dr. Pieter Partridge C. Latrail Pounders, DO  CO-SURGEON: Dr. Fortunato Curling, MD  ASSISTANT: Dr. Emelda Brothers, MD; Weston Brass, NP  ANESTHESIA: General Endotracheal  EBL: 100cc  SPECIMENS: None  DRAINS: None  COMPLICATIONS: None  CONDITION: Hemodynamically stable  HISTORY: Mike Farley is a 70 y.o. male with a history of chronic low back pain, significantly worsening over the past 9 to 10 months.  He had failed multiple conservative measures and his MRI revealed severe progressive degenerative disc disease at L4-5 and L5-S1 with near complete collapse and loss of lordosis.  He also had bilateral facet hypertrophy at L4-5 with moderate to severe lateral recess stenosis bilaterally, and significant bilateral facet hypertrophy at L5-S1 with mild to moderate lateral recess stenosis.  We discussed surgical intervention in the form of an L4-5, L5-S1 ALIF with posterior percutaneous instrumentation, discussed all risks, benefits and expected outcomes as well as alternatives.  He agreed to proceed with surgical intervention, answered all of his  questions.  PROCEDURE IN DETAIL: The patient was brought to the operating room. After induction of general anesthesia, the patient was positioned on the operative table in the supine position. All pressure points were meticulously padded.  I assisted Dr. Carlis Abbott in the exposure portion of the surgery, please see his dictation.  Spinal needle confirmed with lateral fluoroscopy the L5-S1 level.  Using a 15 blade and wide annulotomy was performed.  Using a series of Cobbs, curettes and Kerrison rongeurs a radical discectomy was performed down to the posterior annulus.  The endplates were prepared down to subchondral bone.  Using progressively larger interbody trials, the above trial was selected given its appropriate fit on AP and lateral fluoroscopy.  The interbody device was then filled with allograft, and placed under lateral fluoroscopy.  This was confirmed to be in appropriate placement.  A inferior screw tract was created and then placed the above screw into S1 vertebral body.  Lateral fluoroscopy confirmed appropriate placement.  This was repeated superiorly at L5.  The locking mechanism was then locked into place per the manufacturer's recommendations.  AP and lateral fluoroscopy confirmed appropriate placement.  Dr. Carlis Abbott then assisted then retraction and exposure for the L4-5 level.  Spinal needle confirmed with lateral fluoroscopy the L4-5 level.  Using a 15 blade and wide annulotomy was performed.  Using a series of Cobbs, curettes and Kerrison rongeurs a radical discectomy was performed down to the posterior annulus.  The endplates were prepared down to subchondral bone.  Using progressively larger interbody trials, the above trial was selected given its appropriate fit on AP and lateral fluoroscopy.  The interbody device was then filled with allograft, and placed under lateral fluoroscopy.  This was confirmed to be in appropriate placement.  Three 25  mm anchors were placed, 2 inferior, and 1 superior  into the superior and inferior vertebral bodies.  The locking mechanism was then locked into place per the manufacturer's recommendations.  AP and lateral fluoroscopy confirmed appropriate placement.  The retractors were gently laid down and the vascular bundle and abdominal contents returned to the original position.  The retroperitoneal space was visualized for series of minutes and noted to be excellently hemostatic.  Hemostasis was achieved of the soft tissues with bipolar cautery superficially.  Using 0 PDS suture, the anterior rectus fascia was closed in a running fashion.  An abdominal x-ray was obtained and cleared by radiologist for no retained foreign bodies.  The dermis was then closed with 2-0 and 3-0 Vicryl sutures.  Skin was closed with skin glue.  Sterile dressing was applied.  At this point all counts were correct x2 of sponge, needle and instruments.  The back table was remained sterile.  Drapes were taken down and the patient was carefully positioned onto a gurney and then positioned prone on the [place] table.  All bony prominences were appropriately positioned.  A physician driven second timeout was performed.  The lumbar region was sterilely prepped and draped in normal fashion.  Biplanar fluoroscopy was brought into the field sterilely.  Paramedian incisions were created with a 10 blade over the L4, L5 and S1 pedicles bilaterally.  Using Jamshidi needle, the L4, L5 and S1 bilateral pedicles were cannulated and K wires were placed.  The above listed pedicle screws were then placed over the K wires and the K wires were removed.  AP and lateral fluoroscopy confirmed appropriate placement.  Appropriate sized rods were measured, contoured and placed percutaneously.  Setscrews were placed and final tightened to the manufacturer recommendation.  Percutaneous towers were removed.  AP and lateral fluoroscopy confirmed appropriate placement of hardware.  Hemostasis was achieved with bipolar cautery.   2-0 and 3-0 Vicryl sutures were used for dermis closure.  Skin was closed with skin glue.  Sterile dressing was applied.  At the end of the case, all sponge, instrument and sharps counts were correct x2.  The patient tolerated the procedure well.  He was carefully positioned supine on the gurney, extubated and taken to the recovery room in a stable fashion.

## 2021-04-23 NOTE — Anesthesia Procedure Notes (Signed)
Arterial Line Insertion Start/End12/05/2020 7:00 AM, 04/23/2021 7:05 AM Performed by: Roderic Palau, MD, Vonna Drafts, CRNA, CRNA  Patient location: Pre-op. Preanesthetic checklist: patient identified, IV checked, site marked, risks and benefits discussed, surgical consent, monitors and equipment checked, pre-op evaluation, timeout performed and anesthesia consent Lidocaine 1% used for infiltration Right, radial was placed Catheter size: 20 G Hand hygiene performed  and maximum sterile barriers used   Attempts: 1 Procedure performed without using ultrasound guided technique. Following insertion, dressing applied and Biopatch. Post procedure assessment: normal  Patient tolerated the procedure well with no immediate complications.

## 2021-04-23 NOTE — Transfer of Care (Signed)
Immediate Anesthesia Transfer of Care Note  Patient: GREEN QUINCY  Procedure(s) Performed: Lumbar four-five,  Lumbar five-Sacral one Anterior Lumbar Interbody Fusion (Spine Lumbar) LUMBAR PERCUTANEOUS PEDICLE SCREW LUMBAR FOUR-SACRAL ONE (Spine Lumbar) ABDOMINAL EXPOSURE  Patient Location: PACU  Anesthesia Type:General  Level of Consciousness: drowsy  Airway & Oxygen Therapy: Patient Spontanous Breathing and Patient connected to nasal cannula oxygen  Post-op Assessment: Report given to RN and Post -op Vital signs reviewed and stable  Post vital signs: Reviewed and stable  Last Vitals:  Vitals Value Taken Time  BP 104/68 04/23/21 1423  Temp    Pulse 87 04/23/21 1427  Resp 19 04/23/21 1427  SpO2 99 % 04/23/21 1427  Vitals shown include unvalidated device data.  Last Pain:  Vitals:   04/23/21 0612  TempSrc:   PainSc: 8       Patients Stated Pain Goal: 2 (44/92/52 4159)  Complications: No notable events documented.

## 2021-04-24 LAB — CBC
HCT: 30 % — ABNORMAL LOW (ref 39.0–52.0)
Hemoglobin: 10.2 g/dL — ABNORMAL LOW (ref 13.0–17.0)
MCH: 31.3 pg (ref 26.0–34.0)
MCHC: 34 g/dL (ref 30.0–36.0)
MCV: 92 fL (ref 80.0–100.0)
Platelets: 184 10*3/uL (ref 150–400)
RBC: 3.26 MIL/uL — ABNORMAL LOW (ref 4.22–5.81)
RDW: 13.1 % (ref 11.5–15.5)
WBC: 9.5 10*3/uL (ref 4.0–10.5)
nRBC: 0 % (ref 0.0–0.2)

## 2021-04-24 LAB — BASIC METABOLIC PANEL
Anion gap: 4 — ABNORMAL LOW (ref 5–15)
BUN: 14 mg/dL (ref 8–23)
CO2: 27 mmol/L (ref 22–32)
Calcium: 8.8 mg/dL — ABNORMAL LOW (ref 8.9–10.3)
Chloride: 107 mmol/L (ref 98–111)
Creatinine, Ser: 0.87 mg/dL (ref 0.61–1.24)
GFR, Estimated: >60 mL/min (ref >60)
Glucose, Bld: 116 mg/dL — ABNORMAL HIGH (ref 70–99)
Potassium: 4.2 mmol/L (ref 3.5–5.1)
Sodium: 138 mmol/L (ref 135–145)

## 2021-04-24 MED ORDER — PANTOPRAZOLE SODIUM 40 MG PO TBEC
40.0000 mg | DELAYED_RELEASE_TABLET | Freq: Every day | ORAL | Status: DC
  Administered 2021-04-24 – 2021-04-26 (×3): 40 mg via ORAL
  Filled 2021-04-24 (×3): qty 1

## 2021-04-24 NOTE — Progress Notes (Addendum)
[facility]  Subjective  - Now major complaints, tolerated clears.   Objective 109/61 73 98.6 F (37 C) (Oral) 15 96%  Intake/Output Summary (Last 24 hours) at 04/24/2021 0736 Last data filed at 04/24/2021 0357 Gross per 24 hour  Intake 3395.53 ml  Output 2200 ml  Net 1195.53 ml    Abdomin non distended, NTTP away from incision Incision anterior abdomin healing well without surrounding erythema Passed flatus per patient Palpable pedal pulses  Assessment/Planning: POD # 1 Anterior spine exposure at the L4-L5 and L5-S1 disc space via anterior retroperitoneal approach for L4-L5 and L5-S1 ALIF  Passing flatus and tolerated clears without N/V Diet has been advanced to regular for today Stable disposition from an exposure/vascular point of view  Roxy Horseman 04/24/2021 7:36 AM --  Laboratory Lab Results: Recent Labs    04/23/21 1815 04/24/21 0422  WBC 13.2* 9.5  HGB 11.9* 10.2*  HCT 36.2* 30.0*  PLT 207 184   BMET Recent Labs    04/23/21 1815 04/24/21 0422  NA  --  138  K  --  4.2  CL  --  107  CO2  --  27  GLUCOSE  --  116*  BUN  --  14  CREATININE 0.88 0.87  CALCIUM  --  8.8*    COAG Lab Results  Component Value Date   INR 1.03 05/29/2009   INR 1.0 07/18/2007   No results found for: PTT  I have seen and evaluated the patient. I agree with the PA note as documented above.  Postop day 1 status post L4-L5 and L5-S1 abdominal exposure for ALIF.  Palpable DP pulses bilaterally.  Appropriate postop incisional tenderness and abdomen is soft.  Tolerating liquids and passing gas.  Looks good from my standpoint.  Call vascular with questions or concerns.  [name], MD [facility] Office: [phone]

## 2021-04-24 NOTE — Progress Notes (Addendum)
Subjective: Patient reports doing well overall. No acute events overnight. He has appropriate abdominal and lumbar surgical pain. No complaints of lower extremity pain, weakness, or paraesthesia.   Objective: Vital signs in last 24 hours: Temp:  [97 F (36.1 C)-98.6 F (37 C)] 98.6 F (37 C) (12/02 0352) Pulse Rate:  [69-101] 80 (12/02 0753) Resp:  [10-25] 15 (12/02 0753) BP: (87-153)/(59-95) 118/73 (12/02 0753) SpO2:  [93 %-100 %] 96 % (12/02 0753)  Intake/Output from previous day: 12/01 0701 - 12/02 0700 In: 3395.5 [P.O.:100; I.V.:2945.5; IV Piggyback:350] Out: 2200 [Urine:2100; Blood:100] Intake/Output this shift: No intake/output data recorded.  Physical Exam: Patient is awake, A/O X 4, conversant, and in good spirits. They are in NAD and VSS. Doing well. Speech is fluent and appropriate. MAEW with good strength that is symmetric bilaterally. 5/5 BUE/BLE. Sensation to light touch is intact. PERLA, EOMI. CNs grossly intact. Abdomen is soft and non distended. Passing flatus. Distal pulses intact. Appropriate abdominal and lumbar incisional pain. Dressings are clean dry intact. Incisions are well approximated with no drainage, erythema, or edema.     Lab Results: Recent Labs    04/23/21 1815 04/24/21 0422  WBC 13.2* 9.5  HGB 11.9* 10.2*  HCT 36.2* 30.0*  PLT 207 184   BMET Recent Labs    04/23/21 1815 04/24/21 0422  NA  --  138  K  --  4.2  CL  --  107  CO2  --  27  GLUCOSE  --  116*  BUN  --  14  CREATININE 0.88 0.87  CALCIUM  --  8.8*    Studies/Results: DG Lumbar Spine 2-3 Views  Result Date: 04/23/2021 CLINICAL DATA:  L4 through S1 posterior fusion EXAM: LUMBAR SPINE - 2-3 VIEW COMPARISON:  Lumbar spine radiographs 03/03/2021, lumbar spine MRI 02/22/2021 FINDINGS: Two C-arm fluoroscopic images were obtained intraoperatively and submitted for post operative interpretation. Postsurgical changes reflecting L4 through S1 posterior fusion with interbody spacers  are seen. Hardware alignment is within expected limits, without evidence of immediate complication. Fluoro time 5 minutes 4 seconds. Please see the performing provider's procedural report for further detail. IMPRESSION: Status post L4 through S1 fusion without evidence of immediate complication. Electronically Signed   By: Valetta Mole M.D.   On: 04/23/2021 15:02   DG C-Arm 1-60 Min-No Report  Result Date: 04/23/2021 Fluoroscopy was utilized by the requesting physician.  No radiographic interpretation.   DG C-Arm 1-60 Min-No Report  Result Date: 04/23/2021 Fluoroscopy was utilized by the requesting physician.  No radiographic interpretation.   DG C-Arm 1-60 Min-No Report  Result Date: 04/23/2021 Fluoroscopy was utilized by the requesting physician.  No radiographic interpretation.   DG C-Arm 1-60 Min-No Report  Result Date: 04/23/2021 Fluoroscopy was utilized by the requesting physician.  No radiographic interpretation.   DG C-Arm 1-60 Min-No Report  Result Date: 04/23/2021 Fluoroscopy was utilized by the requesting physician.  No radiographic interpretation.   DG C-Arm 1-60 Min-No Report  Result Date: 04/23/2021 Fluoroscopy was utilized by the requesting physician.  No radiographic interpretation.   DG OR LOCAL ABDOMEN  Result Date: 04/23/2021 CLINICAL DATA:  Retained foreign body. EXAM: OR LOCAL ABDOMEN COMPARISON:  None. FINDINGS: Status post surgical fusion of L4-5 and L5-S1 disc spaces. No abnormal bowel dilatation is noted. No other definite radiopaque foreign body is noted. IMPRESSION: Status post surgical fusion of L4-5 and L5-S1 disc spaces. No other definite radiopaque foreign body is noted. These results were called by telephone at the time  of interpretation on 04/23/2021 at 12:03 pm to provider Sarah in [place] [facility], who verbally acknowledged these results. Electronically Signed   By: Marijo Conception M.D.   On: 04/23/2021 12:04    Assessment/Plan: Patient is post-op day 1 s/p ALIF  L4-5, L5-S1 with posterior percutaneous pedicle screw instrumentation L4-S1. He is recovering well and reports appropriate incisional discomfort. No new complaints of lower extremity pain/numbness. Tolerated clear liquid diet well. Diet advanced to regular. He is awaiting PT/OT evaluation. Continue working on pain control, mobility and ambulating patient.    - PT/OT - pain control, on regimen recs from pain management physician - cont pulse ox   LOS: 1 day     Marvis Moeller, DNP, NP-C 04/24/2021, 8:05 AM  Addendum: Patient seen and examined.  Agree with above   -Patient has pain medication from his pain management physician therefore will not need pain medication upon discharge from our team.  Plan discharge tomorrow versus Sunday.   Thank you for allowing me to participate in this patient's care.  Please do not hesitate to call with questions or concerns.   Elwin Sleight, [facility] Cell: [phone]

## 2021-04-24 NOTE — Evaluation (Signed)
Physical Therapy Evaluation Patient Details Name: Mike Farley MRN: [id number] DOB: [birth year] Today's Date: 04/24/2021  History of Present Illness  70 y/o male admitted s/p L4-5 & L5-S1 ALIF on 12/1. PMH: mild cognitive impairment with memory loss, prostate cancer, ACDF 2011; L elbow fx; R TKA  Clinical Impression  Patient admitted following above procedure. Patient presents with post op weakness, impaired balance, decreased activity tolerance, and pain. Educated patient on back precautions and provided handout, patient verbalized understanding but unable to recall precautions at end of session. Patient requires minA for bed mobility and light modA for sit to stand transfer. Patient unable to tolerate long periods of sitting. Patient ambulated with RW and negotiated 3 stairs with rail on R and HHAx1 with min guard overall. Encouraged OOB mobility with nursing staff during day to increase mobility. Patient will benefit from skilled PT services during acute stay to address listed deficits. Anticipate patient will progress quickly once pain is managed so no PT follow up recommended at this time.      Recommendations for follow up therapy are one component of a multi-disciplinary discharge planning process, led by the attending physician.  Recommendations may be updated based on patient status, additional functional criteria and insurance authorization.  Follow Up Recommendations No PT follow up    Assistance Recommended at Discharge Intermittent Supervision/Assistance  Functional Status Assessment Patient has had a recent decline in their functional status and demonstrates the ability to make significant improvements in function in a reasonable and predictable amount of time.  Equipment Recommendations  BSC/3in1    Recommendations for Other Services       Precautions / Restrictions Precautions Precautions: Fall;Back Precaution Booklet Issued: Yes (comment) Required Braces or Orthoses:  Spinal Brace Spinal Brace: Lumbar corset (per order set, no brace needed - patient with own brace but unable to tolerate over anterior incision) Restrictions Weight Bearing Restrictions: No      Mobility  Bed Mobility Overal bed mobility: Needs Assistance Bed Mobility: Sidelying to Sit   Sidelying to sit: Min assist       General bed mobility comments: in L sidelying on arrival. MinA for trunk elevation with use of bed rail and therapist arm. Increased pain sitting EOB    Transfers Overall transfer level: Needs assistance Equipment used: Rolling Johannah Rozas (2 wheels) Transfers: Sit to/from Stand Sit to Stand: Mod assist           General transfer comment: light modA to stand from EOB with increased time to come to upright posture due to pain in abdomen.    Ambulation/Gait Ambulation/Gait assistance: Min guard Gait Distance (Feet): 200 Feet Assistive device: Rolling Shakela Donati (2 wheels) Gait Pattern/deviations: Step-through pattern;Decreased stride length Gait velocity: decreased     General Gait Details: min guard for safety. Slow steady gait throughout. Increased pain with mobility.  Stairs Stairs: Yes Stairs assistance: Min guard Stair Management: One rail Right;Step to pattern;Forwards (with HHA on L) Number of Stairs: 3 General stair comments: Able to safely negotiate 3 stairs with min guard and step to pattern with R rail and HHA on L.  Wheelchair Mobility    Modified Rankin (Stroke Patients Only)       Balance Overall balance assessment: Needs assistance Sitting-balance support: Bilateral upper extremity supported;Feet supported Sitting balance-Leahy Scale: Poor Sitting balance - Comments: decreased tolerance sitting EOB due to pain requiring heavy UE support and at times minA due to minor LOB.   Standing balance support: Bilateral upper extremity supported;During functional activity;Reliant on  assistive device for balance Standing balance-Leahy Scale:  Poor Standing balance comment: reliant on UE support of RW                             Pertinent Vitals/Pain Pain Assessment: 0-10 Pain Score: 9  Pain Location: incision site Pain Descriptors / Indicators: Operative site guarding Pain Intervention(s): Premedicated before session;Monitored during session;Limited activity within patient's tolerance    Home Living Family/patient expects to be discharged to:: Private residence Living Arrangements: Spouse/significant other Available Help at Discharge: Family;Available 24 hours/day Type of Home: House Home Access: Level entry     Alternate Level Stairs-Number of Steps: 7 (for each level) Home Layout: Multi-level (tri-level) Home Equipment: Rolling Barett Whidbee (2 wheels)      Prior Function Prior Level of Function : Independent/Modified Independent;Driving             Mobility Comments: retired OPPT 5 years ago       Hand Dominance        Extremity/Trunk Assessment   Upper Extremity Assessment Upper Extremity Assessment: Defer to OT evaluation    Lower Extremity Assessment Lower Extremity Assessment: Generalized weakness    Cervical / Trunk Assessment Cervical / Trunk Assessment: Back Surgery  Communication   Communication: No difficulties  Cognition Arousal/Alertness: Awake/alert Behavior During Therapy: WFL for tasks assessed/performed Overall Cognitive Status: Within Functional Limits for tasks assessed                                          General Comments General comments (skin integrity, edema, etc.): wife and son present and supportive    Exercises     Assessment/Plan    PT Assessment Patient needs continued PT services  PT Problem List Decreased strength;Decreased activity tolerance;Decreased balance;Decreased mobility;Decreased knowledge of precautions;Pain       PT Treatment Interventions DME instruction;Gait training;Functional mobility training;Stair  training;Therapeutic activities;Balance training;Therapeutic exercise;Patient/family education    PT Goals (Current goals can be found in the Care Plan section)  Acute Rehab PT Goals Patient Stated Goal: to move better and do more PT Goal Formulation: With patient Time For Goal Achievement: 05/08/21 Potential to Achieve Goals: Good    Frequency Min 5X/week   Barriers to discharge        Co-evaluation               AM-PAC PT "6 Clicks" Mobility  Outcome Measure Help needed turning from your back to your side while in a flat bed without using bedrails?: A Little Help needed moving from lying on your back to sitting on the side of a flat bed without using bedrails?: A Little Help needed moving to and from a bed to a chair (including a wheelchair)?: A Little Help needed standing up from a chair using your arms (e.g., wheelchair or bedside chair)?: A Lot Help needed to walk in hospital room?: A Little Help needed climbing 3-5 steps with a railing? : A Little 6 Click Score: 17    End of Session Equipment Utilized During Treatment: Gait belt Activity Tolerance: Patient tolerated treatment well;Patient limited by pain Patient left: in chair;Other (comment) (handoff to OT) Nurse Communication: Mobility status PT Visit Diagnosis: Unsteadiness on feet (R26.81);Muscle weakness (generalized) (M62.81)    Time: 4098-1191 PT Time Calculation (min) (ACUTE ONLY): 32 min   Charges:   PT Evaluation $PT Eval  Moderate Complexity: 1 Mod PT Treatments $Therapeutic Activity: 8-22 mins        Mannie Ohlin A. Gilford Rile PT, DPT Acute Rehabilitation Services Pager [phone] Office [phone]   Linna Hoff 04/24/2021, 10:38 AM

## 2021-04-24 NOTE — Progress Notes (Signed)
Occupational Therapy Evaluation  PTA pt independent with mobility and ADl. Began education on compensatory strategies for ADL and mobility following back precautions. Will follow acutely to maximize functional level of independence to facilitate safe DC home.     04/24/21 1000  OT Visit Information  Last OT Received On 04/24/21  Assistance Needed +1  History of Present Illness 70 y/o male admitted s/p L4-5 & L5-S1 ALIF on 12/1. PMH: mild cognitive impairment with memory loss, prostate cancer, ACDF 2011; L elbow fx; R TKA  Precautions  Precautions Fall;Back  Precaution Booklet Issued Yes (comment)  Required Braces or Orthoses Spinal Brace  Spinal Brace Lumbar corset (per order set, no brace needed - patient with own brace but unable to tolerate over anterior incision)  Home Living  Family/patient expects to be discharged to: Private residence  Living Arrangements Spouse/significant other  Available Help at Discharge Family;Available 24 hours/day  Type of Home House  Home Access Level entry  Home Layout Multi-level (tri-level)  Alternate Level Stairs-Number of Steps 7 (for each level)  Alternate Level Stairs-Rails Right  Bathroom Shower/Tub Walk-in shower  Bathroom Toilet Handicapped height  Bathroom Accessibility Yes  How Accessible Accessible via wheelchair;Accessible via walker  [place] (2 wheels)  Prior Function  Prior Level of Function  Independent/Modified Independent;Driving  Mobility Comments 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, petroleum No difficulties  Pain Assessment  Pain Assessment Faces  Faces Pain Scale 6  Pain Location incision site/back  Pain Descriptors / Indicators Operative site guarding;Discomfort;Grimacing;Guarding  Pain Intervention(s) Limited activity within patient's tolerance;Repositioned;Ice applied  Cognition  Arousal/Alertness Awake/alert  Behavior During Therapy WFL for tasks assessed/performed  Overall Cognitive  Status Within Functional Limits for tasks assessed  Upper Extremity Assessment  Upper Extremity Assessment Overall WFL for tasks assessed (hx of shoulder surgery)  Lower Extremity Assessment  Lower Extremity Assessment Defer to PT evaluation  Cervical / Trunk Assessment  Cervical / Trunk Assessment Back Surgery  ADL  Overall ADL's  Needs assistance/impaired  Grooming Set up;Supervision/safety  Upper Body Bathing Set up;Supervision/ safety  Lower Body Bathing Sit to/from stand;Moderate assistance  Upper Body Dressing  Supervision/safety;Set up  Lower Body Dressing Moderate assistance;Sit to/from Retail buyer Minimal assistance  Toileting- Clothing Manipulation and Hygiene Moderate assistance  Functional mobility during ADLs Minimal assistance;Rolling walker (2 wheels)  General ADL Comments Began education on compensatory strategies for ADL tasks in addition to use of AE - reacher, long handled sponge and toilet tong if needed; Also educated on use of 3in1 as shower seat. Wife ordering AE; Encouraged pt to ambulate to bathroom rather than using urinal adn to discuss concerns regarding hygiene after BM with OT if concerned during next visit  Bed Mobility  Overal bed mobility Needs Assistance  Bed Mobility Sit to Sidelying  Sit to sidelying Min assist  Transfers  Overall transfer level Needs assistance  Equipment used Rolling walker (2 wheels)  Transfers Sit to/from Stand  Sit to Stand Min assist  Balance  Overall balance assessment Needs assistance  Sitting-balance support Bilateral upper extremity supported;Feet supported  Sitting balance-Leahy Scale Good  Standing balance support Bilateral upper extremity supported;During functional activity;Reliant on assistive device for balance  Standing balance-Leahy Scale Poor  Standing balance comment reliant on UE support of RW  OT - End of Session  Equipment Utilized During Treatment Rolling walker (2 wheels)  Activity Tolerance  Patient tolerated treatment well  Patient left in bed;with call bell/phone within reach;with family/visitor present  Nurse Communication Mobility status  OT Assessment  OT Recommendation/Assessment Patient needs continued OT Services  OT Visit Diagnosis Unsteadiness on feet (R26.81);Other abnormalities of gait and mobility (R26.89);Muscle weakness (generalized) (M62.81);Pain  Pain - part of body  (back)  OT Problem List Decreased strength;Decreased activity tolerance;Impaired balance (sitting and/or standing);Decreased safety awareness;Decreased knowledge of use of DME or AE;Pain  OT Plan  OT Frequency (ACUTE ONLY) Min 2X/week  OT Treatment/Interventions (ACUTE ONLY) Self-care/ADL training;Therapeutic exercise;DME and/or AE instruction;Therapeutic activities;Patient/family education  AM-PAC OT "6 Clicks" Daily Activity Outcome Measure (Version 2)  Help from another person eating meals? 4  Help from another person taking care of personal grooming? 3  Help from another person toileting, which includes using toliet, bedpan, or urinal? 2  Help from another person bathing (including washing, rinsing, drying)? 2  Help from another person to put on and taking off regular upper body clothing? 3  Help from another person to put on and taking off regular lower body clothing? 2  6 Click Score 16  Progressive Mobility  What is the highest level of mobility based on the progressive mobility assessment? Level 5 (Walks with assist in room/hall) - Balance while stepping forward/back and can walk in room with assist - Complete  Mobility Out of bed to chair with meals;Out of bed for toileting  OT Recommendation  Follow Up Recommendations No OT follow up  Assistance recommended at discharge Intermittent Supervision/Assistance  Functional Status Assessent Patient has had a recent decline in their functional status and demonstrates the ability to make significant improvements in function in a reasonable and  predictable amount of time.  OT Equipment BSC/3in1  Individuals Consulted  Consulted and Agree with Results and Recommendations Patient;Family member/caregiver  Family Member Consulted wife  Acute Rehab OT Goals  Patient Stated Goal to get better and not have surgery again  OT Goal Formulation With patient  Time For Goal Achievement 05/08/21  Potential to Achieve Goals Good  OT Time Calculation  OT Start Time (ACUTE ONLY) 1016  OT Stop Time (ACUTE ONLY) 1046  OT Time Calculation (min) 30 min  OT General Charges  $OT Visit 1 Visit  OT Evaluation  $OT Eval Moderate Complexity 1 Mod  OT Treatments  $Self Care/Home Management  8-22 mins  Written Expression  Dominant Hand Right  Maurie Boettcher, OT/L   Acute OT Clinical Specialist [place] Pager [phone] Office [phone]

## 2021-04-25 NOTE — Progress Notes (Signed)
Occupational Therapy Treatment Patient Details Name: Mike Farley MRN: [id number] DOB: Sep 09, 1950 Today's Date: 04/25/2021   History of present illness 70 y/o male admitted s/p L4-5 & L5-S1 ALIF on 12/1. PMH: mild cognitive impairment with memory loss, prostate cancer, ACDF 2011; L elbow fx; R TKA   OT comments  Pt. Seen for OT session with focus on introduction and use of A/E.  Pt. Reports not having slept well last night also c/o back pain.  Able to return demo of use of reacher while seated eob but unable to tolerate anything more.  Pt. Began standing eob as he was unable to tolerate sitting eob. Declined sitting in recliner also.  Reports he is unsure of brace recommendations/rules and states he does not think he could tolerate wearing one anyway at this time.  Continued to apologize for not being able to participate in session.  Provided reassurance and assisted pt. Back to bed. Reviewed we will check back and continue skilled OT session with A/E as pt. Able.     Recommendations for follow up therapy are one component of a multi-disciplinary discharge planning process, led by the attending physician.  Recommendations may be updated based on patient status, additional functional criteria and insurance authorization.    Follow Up Recommendations  No OT follow up    Assistance Recommended at Discharge Intermittent Supervision/Assistance  Equipment Recommendations  BSC/3in1    Recommendations for Other Services      Precautions / Restrictions Precautions Precautions: Fall;Back Required Braces or Orthoses: Spinal Brace Spinal Brace: Lumbar corset -pt. Declined brace secondary to pain but also states he is unsure if it is "required" or not      Mobility Bed Mobility Overal bed mobility: Needs Assistance Bed Mobility: Rolling;Sidelying to Sit;Sit to Sidelying Rolling: Supervision Sidelying to sit: Supervision     Sit to sidelying: Supervision General bed mobility comments:  pt. in L side lying at beginning and end of session    Transfers Overall transfer level: Needs assistance   Transfers: Sit to/from Stand Sit to Stand: Supervision;Min guard           General transfer comment: pt. would cont. to stand wtihout warning secondary to pain. states "im getting mixed reviews if i need the brace or not but i can not tolerate it anyway due to location of incision on lower stomach"     Balance                                           ADL either performed or assessed with clinical judgement   ADL Overall ADL's : Needs assistance/impaired                     Lower Body Dressing: With adaptive equipment;Minimal assistance;Sitting/lateral leans Lower Body Dressing Details (indicate cue type and reason): only able to review reacher to doff sock, pt. unable to tolerate remaining of demo/return demo secondary to severe pain and fatigue               General ADL Comments: pt. seated eob for a/e training. able to doff sock with reacher and pick it up off of the floor, requests to end session secondary to severe pain, can not tolerate sitting cont. to stand up without brace and shift weight, states he did not sleep at all last night.  assisted back to bed. reviewed will  cont. a/e training when able. he was apologetic but i reasured him it was okay and we would try again another time.    Extremity/Trunk Assessment              Vision       Perception     Praxis      Cognition Arousal/Alertness: Awake/alert Behavior During Therapy: WFL for tasks assessed/performed Overall Cognitive Status: Within Functional Limits for tasks assessed                                            Exercises     Shoulder Instructions       General Comments      Pertinent Vitals/ Pain       Pain Assessment: 0-10 Pain Score: 7  Pain Location: incision site Pain Descriptors / Indicators: Operative site  guarding;Aching;Grimacing;Constant Pain Intervention(s): Limited activity within patient's tolerance;Monitored during session;Repositioned  Home Living                                          Prior Functioning/Environment              Frequency  Min 2X/week        Progress Toward Goals  OT Goals(current goals can now be found in the care plan section)  Progress towards OT goals: Progressing toward goals     Plan Discharge plan remains appropriate    Co-evaluation                 AM-PAC OT "6 Clicks" Daily Activity     Outcome Measure   Help from another person eating meals?: None Help from another person taking care of personal grooming?: A Little Help from another person toileting, which includes using toliet, bedpan, or urinal?: A Lot Help from another person bathing (including washing, rinsing, drying)?: A Lot Help from another person to put on and taking off regular upper body clothing?: A Little Help from another person to put on and taking off regular lower body clothing?: A Lot 6 Click Score: 16    End of Session Equipment Utilized During Treatment: Other (comment) (a/e)  OT Visit Diagnosis: Unsteadiness on feet (R26.81);Other abnormalities of gait and mobility (R26.89);Muscle weakness (generalized) (M62.81);Pain   Activity Tolerance Patient limited by pain   Patient Left in bed;with call bell/phone within reach   Nurse Communication          Time: 7494-4967 OT Time Calculation (min): 9 min  Charges: OT General Charges $OT Visit: 1 Visit OT Treatments $Self Care/Home Management : 8-22 mins  Sonia Baller, COTA/L Acute Rehabilitation [phone]   Tanya Nones 04/25/2021, 11:55 AM

## 2021-04-25 NOTE — Progress Notes (Signed)
  NEUROSURGERY PROGRESS NOTE   No issues overnight. Pt reports significant back pain, no leg pain. Ambulating with PT/OT. Passing flatus, no BM yet.  EXAM:  BP 108/60 (BP Location: Right Arm)   Pulse 75   Temp 99.5 F (37.5 C) (Oral)   Resp 18   Ht 5\' 10"  (1.778 m)   Wt 75.8 kg   SpO2 95%   BMI 23.96 kg/m   Awake, alert, oriented  Speech fluent, appropriate  CN grossly intact  5/5 BUE/BLE   IMPRESSION:  70 y.o. male POD#2 s/p L4-5/L5-S1 ALIF. Progressing slowly.  PLAN: - Cont to mobilize - Likely d/c home tomorrow.   Consuella Lose, MD [facility]

## 2021-04-25 NOTE — Progress Notes (Signed)
Physical Therapy Treatment Patient Details Name: Mike Farley MRN: [id number] DOB: 16-Apr-1951 Today's Date: 04/25/2021   History of Present Illness 70 y/o male admitted s/p L4-5 & L5-S1 ALIF on 12/1. PMH: mild cognitive impairment with memory loss, prostate cancer, ACDF 2011; L elbow fx; R TKA    PT Comments    Patient limited by pain this date. Patient required encouragement to participate with therapy due to pain. Patient ambulated 300' with RW and supervision for safety with intermittent standing rest breaks. Educated patient on progressive walking program to increase endurance and overall mobility, patient verbalized understanding. No PT follow up recommended at this time.     Recommendations for follow up therapy are one component of a multi-disciplinary discharge planning process, led by the attending physician.  Recommendations may be updated based on patient status, additional functional criteria and insurance authorization.  Follow Up Recommendations  No PT follow up     Assistance Recommended at Discharge Intermittent Supervision/Assistance  Equipment Recommendations  BSC/3in1    Recommendations for Other Services       Precautions / Restrictions Precautions Precautions: Fall;Back Precaution Booklet Issued: Yes (comment) Spinal Brace: Lumbar corset (per order set, no brace needed - patient with own brace but unable to tolerate over anterior incision) Restrictions Weight Bearing Restrictions: No     Mobility  Bed Mobility Overal bed mobility: Needs Assistance Bed Mobility: Rolling;Sidelying to Sit;Sit to Sidelying Rolling: Supervision Sidelying to sit: Supervision     Sit to sidelying: Supervision General bed mobility comments: pt. in L side lying at beginning of session and returned to R sidelying at end of session    Transfers Overall transfer level: Needs assistance Equipment used: Rolling Brynlei Klausner (2 wheels) Transfers: Sit to/from Stand Sit to Stand:  Supervision           General transfer comment: supervision for safety    Ambulation/Gait Ambulation/Gait assistance: Supervision Gait Distance (Feet): 300 Feet Assistive device: Rolling Marquail Bradwell (2 wheels) Gait Pattern/deviations: Step-through pattern;Decreased stride length Gait velocity: decreased     General Gait Details: supervision for safety. Slow steady gait with intermittent standing rest breaks due to pain   Stairs             Wheelchair Mobility    Modified Rankin (Stroke Patients Only)       Balance Overall balance assessment: Needs assistance Sitting-balance support: Bilateral upper extremity supported;Feet supported Sitting balance-Leahy Scale: Poor     Standing balance support: Bilateral upper extremity supported;During functional activity;Reliant on assistive device for balance Standing balance-Leahy Scale: Poor Standing balance comment: reliant on UE support of RW                            Cognition Arousal/Alertness: Awake/alert Behavior During Therapy: WFL for tasks assessed/performed Overall Cognitive Status: Within Functional Limits for tasks assessed                                          Exercises      General Comments        Pertinent Vitals/Pain Pain Assessment: Faces Faces Pain Scale: Hurts even more Pain Location: incision site Pain Descriptors / Indicators: Operative site guarding;Aching;Grimacing;Constant Pain Intervention(s): Monitored during session;Repositioned;Premedicated before session    Home Living  Prior Function            PT Goals (current goals can now be found in the care plan section) Acute Rehab PT Goals Patient Stated Goal: to move better and do more PT Goal Formulation: With patient Time For Goal Achievement: 05/08/21 Potential to Achieve Goals: Good Progress towards PT goals: Progressing toward goals    Frequency    Min  5X/week      PT Plan Current plan remains appropriate    Co-evaluation              AM-PAC PT "6 Clicks" Mobility   Outcome Measure  Help needed turning from your back to your side while in a flat bed without using bedrails?: A Little Help needed moving from lying on your back to sitting on the side of a flat bed without using bedrails?: A Little Help needed moving to and from a bed to a chair (including a wheelchair)?: A Little Help needed standing up from a chair using your arms (e.g., wheelchair or bedside chair)?: A Little Help needed to walk in hospital room?: A Little Help needed climbing 3-5 steps with a railing? : A Little 6 Click Score: 18    End of Session Equipment Utilized During Treatment: Gait belt Activity Tolerance: Patient tolerated treatment well;Patient limited by pain Patient left: in bed;with call bell/phone within reach;with family/visitor present Nurse Communication: Mobility status PT Visit Diagnosis: Unsteadiness on feet (R26.81);Muscle weakness (generalized) (M62.81)     Time: 0109-3235 PT Time Calculation (min) (ACUTE ONLY): 30 min  Charges:  $Gait Training: 23-37 mins                     Justyne Roell A. Gilford Rile PT, DPT Acute Rehabilitation Services Pager [phone] Office [phone]    Linna Hoff 04/25/2021, 5:52 PM

## 2021-04-26 MED ORDER — POLYETHYLENE GLYCOL 3350 17 G PO PACK
17.0000 g | PACK | Freq: Every day | ORAL | Status: DC
  Administered 2021-04-26 – 2021-04-27 (×2): 17 g via ORAL
  Filled 2021-04-26 (×2): qty 1

## 2021-04-26 NOTE — Progress Notes (Signed)
Pt does not feel ready to discharge home due to poor pain control, despite consistent use of PRN pain medications. Dr. Kathyrn Sheriff educated pt on risks of increased hospital stay, benefits of being home, and on reasonable pain management expectations. This information was reinforced by this RN throughout the day, but pt wants to stay one more night.   Justice Rocher, RN

## 2021-04-26 NOTE — Progress Notes (Signed)
  NEUROSURGERY PROGRESS NOTE   No issues overnight. Pt reports mild improvement in back pain but still requiring IV dilaudid in addition to schedule oxycodone.  EXAM:  BP 114/71 (BP Location: Right Arm)   Pulse 68   Temp 98.4 F (36.9 C) (Oral)   Resp 16   Ht 5\' 10"  (1.778 m)   Wt 75.8 kg   SpO2 92%   BMI 23.96 kg/m   Awake, alert, oriented  Speech fluent, appropriate  CN grossly intact  5/5 BUE/BLE   IMPRESSION:  70 y.o. male POD#3 s/p L4-5/L5-S1 ALIF. Progressing slowly  PLAN: - Cont to mobilize - May d/c later today   Consuella Lose, MD [facility]

## 2021-04-26 NOTE — Progress Notes (Signed)
Physical Therapy Treatment Patient Details Name: Mike Farley MRN: [id number] DOB: 01-17-1951 Today's Date: 04/26/2021   History of Present Illness 70 y/o male admitted s/p L4-5 & L5-S1 ALIF on 12/1. PMH: mild cognitive impairment with memory loss, prostate cancer, ACDF 2011; L elbow fx; R TKA    PT Comments    Pt demonstrates mod I bed mobility. Supervision provided for transfers and ambulation 180' with RW. Min guard assist ascend/descend 7 steps with R rail. Pt is progressing well with mobility but continues to report high pain level. He demonstrates good adherence to back precautions. Pt returned to L sidelying in bed at end of session.    Recommendations for follow up therapy are one component of a multi-disciplinary discharge planning process, led by the attending physician.  Recommendations may be updated based on patient status, additional functional criteria and insurance authorization.  Follow Up Recommendations  No PT follow up     Assistance Recommended at Discharge Intermittent Supervision/Assistance  Equipment Recommendations  BSC/3in1    Recommendations for Other Services       Precautions / Restrictions Precautions Precautions: Fall;Back Restrictions Weight Bearing Restrictions: No Other Position/Activity Restrictions: no brace needed per order     Mobility  Bed Mobility Overal bed mobility: Modified Independent             General bed mobility comments: increased time    Transfers Overall transfer level: Needs assistance Equipment used: Rolling walker (2 wheels) Transfers: Sit to/from Stand Sit to Stand: Supervision           General transfer comment: supervision for safety    Ambulation/Gait Ambulation/Gait assistance: Supervision Gait Distance (Feet): 180 Feet Assistive device: Rolling walker (2 wheels) Gait Pattern/deviations: Step-through pattern;Decreased stride length Gait velocity: decreased Gait velocity interpretation:  1.31 - 2.62 ft/sec, indicative of limited community ambulator   General Gait Details: steady gait with RW   Stairs Stairs: Yes Stairs assistance: Min guard Stair Management: One rail Right;Step to pattern;Sideways Number of Stairs: 7     Wheelchair Mobility    Modified Rankin (Stroke Patients Only)       Balance Overall balance assessment: Needs assistance Sitting-balance support: Feet supported;No upper extremity supported Sitting balance-Leahy Scale: Fair     Standing balance support: Bilateral upper extremity supported;During functional activity;Reliant on assistive device for balance Standing balance-Leahy Scale: Poor Standing balance comment: reliant on UE support of RW                            Cognition Arousal/Alertness: Awake/alert Behavior During Therapy: WFL for tasks assessed/performed Overall Cognitive Status: Within Functional Limits for tasks assessed                                          Exercises      General Comments        Pertinent Vitals/Pain Pain Assessment: 0-10 Pain Score: 9  Pain Location: back Pain Descriptors / Indicators: Grimacing;Operative site guarding;Constant Pain Intervention(s): Limited activity within patient's tolerance;Monitored during session;Patient requesting pain meds-RN notified    Home Living                          Prior Function            PT Goals (current goals can now be found in the care  plan section) Acute Rehab PT Goals Patient Stated Goal: home Progress towards PT goals: Progressing toward goals    Frequency    Min 5X/week      PT Plan Current plan remains appropriate    Co-evaluation              AM-PAC PT "6 Clicks" Mobility   Outcome Measure  Help needed turning from your back to your side while in a flat bed without using bedrails?: None Help needed moving from lying on your back to sitting on the side of a flat bed without using  bedrails?: None Help needed moving to and from a bed to a chair (including a wheelchair)?: A Little Help needed standing up from a chair using your arms (e.g., wheelchair or bedside chair)?: A Little Help needed to walk in hospital room?: A Little Help needed climbing 3-5 steps with a railing? : A Little 6 Click Score: 20    End of Session Equipment Utilized During Treatment: Gait belt Activity Tolerance: Patient tolerated treatment well Patient left: in bed;with call bell/phone within reach Nurse Communication: Mobility status;Patient requests pain meds PT Visit Diagnosis: Unsteadiness on feet (R26.81);Muscle weakness (generalized) (M62.81)     Time: 1287-8676 PT Time Calculation (min) (ACUTE ONLY): 12 min  Charges:  $Gait Training: 8-22 mins                     Lorrin Goodell, [place]  Office # [phone] Pager [phone]    Lorriane Shire 04/26/2021, 11:50 AM

## 2021-04-27 ENCOUNTER — Encounter (HOSPITAL_COMMUNITY): Payer: Self-pay | Admitting: Neurological Surgery

## 2021-04-27 MED ORDER — BISACODYL 10 MG RE SUPP
10.0000 mg | Freq: Once | RECTAL | Status: AC
  Administered 2021-04-27: 10 mg via RECTAL
  Filled 2021-04-27: qty 1

## 2021-04-27 MED ORDER — KETOROLAC TROMETHAMINE 15 MG/ML IJ SOLN
7.5000 mg | Freq: Once | INTRAMUSCULAR | Status: AC
  Administered 2021-04-27: 7.5 mg via INTRAVENOUS
  Filled 2021-04-27: qty 1

## 2021-04-27 NOTE — TOC Transition Note (Signed)
Transition of Care (TOC) - CM/SW Discharge Note Marvetta Gibbons RN,BSN Transitions of Care Unit 4NP (Non Trauma)- RN Case Manager See Treatment Team for direct Phone #    Patient Details  Name: Mike Farley MRN: [id number] Date of Birth: Apr 13, 1951  Transition of Care [facility]) CM/SW Contact:  Dawayne Patricia, RN Phone Number: 04/27/2021, 12:56 PM   Clinical Narrative:    Pt stable for transition home today, notified by bedside RN that pt would like a 3n1 for home per therapy recommendations. Order placed for DME need.  Call made to in house provider- [employer] for DME- 3n1 to be delivered to room prior to discharge.    Final next level of care: Home/Self Care Barriers to Discharge: No Barriers Identified   Patient Goals and CMS Choice Patient states their goals for this hospitalization and ongoing recovery are:: return home CMS Medicare.gov Compare Post Acute Care list provided to:: Patient Choice offered to / list presented to : Patient  Discharge Placement                 home      Discharge Plan and Services   Discharge Planning Services: CM Consult Post Acute Care Choice: Durable Medical Equipment          DME Arranged: 3-N-1 DME Agency: [facility] Date DME Agency Contacted: 04/27/21 Time DME Agency Contacted: [id number] Representative spoke with at DME Agency: Freda Munro HH Arranged: NA [place] Agency: NA        Social Determinants of Health ([place]) Interventions     Readmission Risk Interventions Readmission Risk Prevention Plan 04/27/2021  Post Dischage Appt Complete  Medication Screening Complete  Transportation Screening Complete  Some recent data might be hidden

## 2021-04-27 NOTE — Progress Notes (Signed)
OT Cancellation Note  Patient Details Name: Mike Farley MRN: [id number] DOB: 10-Aug-1950   Cancelled Treatment:    Reason Eval/Treat Not Completed: Pain limiting ability to participate Pt asking OT to return at a later time due to being in too much pain. NSg made aware.   Ramond Dial, OT/L   Acute OT Clinical Specialist Acute Rehabilitation Services Pager [phone] Office [phone]  04/27/2021, 9:28 AM

## 2021-04-27 NOTE — Progress Notes (Signed)
Physical Therapy Treatment Patient Details Name: Mike Farley MRN: [id number] DOB: 03-12-1951 Today's Date: 04/27/2021   History of Present Illness 70 y/o male admitted s/p L4-5 & L5-S1 ALIF on 12/1. PMH: mild cognitive impairment with memory loss, prostate cancer, ACDF 2011; L elbow fx; R TKA    PT Comments    Continuing work on functional mobility and activity tolerance;  Assisted pt to the bathroom with occasional cues for RW management and safety; Pain limiting him this am, but he had walked the hallway earlier; Questions solicited and answered; OK for dc home from PT standpoint   Recommendations for follow up therapy are one component of a multi-disciplinary discharge planning process, led by the attending physician.  Recommendations may be updated based on patient status, additional functional criteria and insurance authorization.  Follow Up Recommendations  No PT follow up     Assistance Recommended at Discharge Intermittent Supervision/Assistance  Equipment Recommendations  BSC/3in1    Recommendations for Other Services       Precautions / Restrictions Precautions Precautions: Fall;Back Precaution Booklet Issued: Yes (comment) Required Braces or Orthoses: Spinal Brace Restrictions Other Position/Activity Restrictions: no brace needed per order     Mobility  Bed Mobility Overal bed mobility: Modified Independent             General bed mobility comments: Joined pt as he was walking to the bathroom with the RW    Transfers   Equipment used: Rolling walker (2 wheels) Transfers: Sit to/from Stand Sit to Stand: Supervision           General transfer comment: supervision for safety    Ambulation/Gait Ambulation/Gait assistance: Supervision   Assistive device: Rolling walker (2 wheels) Gait Pattern/deviations: Step-through pattern;Decreased stride length       General Gait Details: steady gait with RW; Needed cues for safety/getting fully close  to commode before turning and sitting (was a bit impulsive getting to the commode)   Stairs         General stair comments: Pt reports no concerns re: stair negotiation; has already practiced stairs   Wheelchair Mobility    Modified Rankin (Stroke Patients Only)       Balance     Sitting balance-Leahy Scale: Fair       Standing balance-Leahy Scale: Poor                              Cognition Arousal/Alertness: Awake/alert Behavior During Therapy: WFL for tasks assessed/performed Overall Cognitive Status: Within Functional Limits for tasks assessed                                          Exercises      General Comments        Pertinent Vitals/Pain Pain Assessment: Faces Faces Pain Scale: Hurts whole lot Pain Location: back Pain Descriptors / Indicators: Grimacing;Operative site guarding;Constant Pain Intervention(s): Monitored during session    Home Living                          Prior Function            PT Goals (current goals can now be found in the care plan section) Acute Rehab PT Goals Patient Stated Goal: home PT Goal Formulation: With patient Time For Goal Achievement: 05/08/21 Potential  to Achieve Goals: Good Progress towards PT goals: Progressing toward goals    Frequency    Min 5X/week      PT Plan Current plan remains appropriate    Co-evaluation              AM-PAC PT "6 Clicks" Mobility   Outcome Measure  Help needed turning from your back to your side while in a flat bed without using bedrails?: None Help needed moving from lying on your back to sitting on the side of a flat bed without using bedrails?: None Help needed moving to and from a bed to a chair (including a wheelchair)?: A Little Help needed standing up from a chair using your arms (e.g., wheelchair or bedside chair)?: A Little Help needed to walk in hospital room?: A Little Help needed climbing 3-5 steps with a  railing? : A Little 6 Click Score: 20    End of Session   Activity Tolerance: Patient tolerated treatment well;Other (comment) (Though still quite painful) Patient left: in bed;with call bell/phone within reach Nurse Communication: Mobility status;Patient requests pain meds PT Visit Diagnosis: Unsteadiness on feet (R26.81);Muscle weakness (generalized) (M62.81)     Time: 7530-0511 PT Time Calculation (min) (ACUTE ONLY): 12 min  Charges:  $Gait Training: 8-22 mins                     Roney Marion, [place]  Acute Rehabilitation Services Pager [phone] Office [phone]    Colletta [place] 04/27/2021, 2:03 PM

## 2021-04-27 NOTE — Care Management Important Message (Signed)
Important Message  Patient Details  Name: Mike Farley MRN: [id number] Date of Birth: [birth year]   Medicare Important Message Given:  Yes     Hannah Beat 04/27/2021, 12:21 PM

## 2021-04-27 NOTE — Discharge Summary (Signed)
Physician Discharge Summary  Patient ID: DEMONTRAY FRANTA MRN: [id number] DOB/AGE: 08/04/1950 70 y.o.  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 The hospital course was complicated by severe low back pain. There were no other complications. The wound remained clean dry and intact. Pt had appropriate abdominal soreness. No complaints of leg pain or new N/T/W. The patient remained afebrile with stable vital signs, and tolerated a regular diet. The patient continued to increase activities, and pain was well controlled with oral pain medications. He feels that he is ready for discharge at this time. Postoperative pain management to be performed by his pain management provider.    Consults:  Vascular Surgery  Significant Diagnostic Studies: radiology: X-Ray: intraoperative   Treatments: surgery:  Stage 1:   Anterior lumbar discectomy and interbody arthrodesis, L4-5, L5-S1; anterior retroperitoneal approach Placement of anterior biomechanical device, L4-5, L5-S1 Globus titanium interbody device (L4-5: 62mm 8 deg lordosis, L5-S1: 34mm 15 deg lordosis)  Placement of anterior hardware, L4-5 42mm anchors x3, L5-S1 31mm screws x2 Intraoperative use of allograft Intraoperative use of fluoroscopy   Stage 2:   Posterior segmental pedicle  screw instrumentation, L4, L5, S1; medtronic solera (L4: 7.5 x87mm x2, L4: 7.5 x 33mm x2, S1: 7.5x70mm x2) Intraoperative use of fluoroscopy  Discharge Exam: Blood pressure 95/63, pulse 60, temperature 98.2 F (36.8 C), temperature source Oral, resp. rate 18, height 5\' 10"  (1.778 m), weight 75.8 kg, SpO2 99 %. Physical Exam: Patient is awake, A/O X 4, conversant, and in good spirits. They are in NAD and VSS. Doing well. Speech is fluent and appropriate. MAEW with good strength that is symmetric bilaterally. 5/5 BUE/BLE. Sensation to light touch is intact. PERLA, EOMI. CNs grossly intact. Dressings are clean dry intact. Incisions are well approximated with no drainage, erythema, or edema.      Disposition: Discharge disposition: 01-Home or Self Care       Discharge Instructions     Incentive spirometry RT   Complete by: As directed       Allergies as of 04/27/2021       Reactions   Morphine Itching, Rash   Amoxicillin    REACTION: abdominal distention and churning pain in abdomen. Did it involve swelling of the face/tongue/throat, SOB, or low BP? No Did it involve sudden or severe rash/hives, skin peeling, or any reaction on the inside of your mouth or nose? No Did you need to seek medical attention at a hospital or doctor's office? No When did it last happen?      40+ years ago If all above answers are "NO", may proceed with cephalosporin use.        Medication List     TAKE these medications    cholecalciferol 25 MCG (1000 UNIT) tablet Commonly known as: VITAMIN D3 Take 1,000 Units by mouth 3 (three) times a week.   clindamycin 300 MG capsule Commonly known as: CLEOCIN  Take 600 mg by mouth See admin instructions. Take 600 mg by mouth prior to dental procedures   cyanocobalamin 1000 MCG tablet Take 1,000 mcg by mouth daily.   donepezil 5 MG tablet Commonly known as: ARICEPT Take 5 mg by mouth daily.   Melatonin 10 MG Tabs Take 20 mg by mouth at bedtime.    methocarbamol 500 MG tablet Commonly known as: ROBAXIN Take 500 mg by mouth 3 (three) times daily.   multivitamin tablet Take 1 tablet by mouth daily. ABC Complete MEN   mupirocin ointment 2 % Commonly known as: BACTROBAN Apply 1 application topically 2 (two) times daily.   oxyCODONE 15 MG immediate release tablet Commonly known as: ROXICODONE Take 15 mg by mouth every 4 (four) hours as needed for pain.   oxymetazoline 0.05 % nasal spray Commonly known as: AFRIN Place 1 spray into both nostrils at bedtime.   QUEtiapine 300 MG tablet Commonly known as: SEROQUEL TAKE ONE TABLET AT BEDTIME.   rosuvastatin 10 MG tablet Commonly known as: CRESTOR TAKE 1 TABLET BY MOUTH DAILY   tamsulosin 0.4 MG Caps capsule Commonly known as: FLOMAX Take 0.4 mg by mouth daily.   valACYclovir 500 MG tablet Commonly known as: VALTREX Take 1 tablet (500 mg total) by mouth 2 (two) times daily.         Signed: Marvis Moeller, DNP, NP-C 04/27/2021, 7:19 AM

## 2021-04-27 NOTE — Progress Notes (Signed)
Pt with discharge orders, discharge paperwork reviewed with patient and all questions answered. IV's removed. Pt given all equipment and taken down via wheelchair with all belongings.

## 2021-04-28 LAB — BPAM RBC
Blood Product Expiration Date: 202212272359
Blood Product Expiration Date: 202212272359
Unit Type and Rh: 5100
Unit Type and Rh: 5100

## 2021-04-28 LAB — TYPE AND SCREEN
ABO/RH(D): O POS
Antibody Screen: NEGATIVE
Unit division: 0
Unit division: 0

## 2021-05-06 DIAGNOSIS — M48061 Spinal stenosis, lumbar region without neurogenic claudication: Secondary | ICD-10-CM | POA: Diagnosis not present

## 2021-05-11 ENCOUNTER — Telehealth: Payer: Self-pay

## 2021-05-11 NOTE — Telephone Encounter (Addendum)
error 

## 2021-05-11 NOTE — Telephone Encounter (Signed)
Transition Care Management Follow-up Telephone Call Date of discharge and from where: 04/27/2021  Mike Farley How have you been since you were released from the hospital? "Doing great" Any questions or concerns? No  Items Reviewed: Did the pt receive and understand the discharge instructions provided? Yes  Medications obtained and verified? Yes  Other? No  Any new allergies since your discharge? No  Dietary orders reviewed? No Do you have support at home? Yes   Home Care and Equipment/Supplies: Were home health services ordered? not applicable If so, what is the name of the agency? Has the agency set up a time to come to the patient's home?  Were any new equipment or medical supplies ordered?  Yes. Was brought to room prior to discharge What is the name of the medical supply agency?  Were you able to get the supplies/equipment?  Do you have any questions related to the use of the equipment or supplies?   Functional Questionnaire: (I = Independent and D = Dependent) ADLs: I  Bathing/Dressing- with assistance from family  Meal Prep- I  Eating- I  Maintaining continence- I  Transferring/Ambulation- I  Managing Meds- I  Follow up appointments reviewed:  PCP Hospital f/u appt confirmed? No   Specialist Hospital f/u appt confirmed? Yes  already completed Are transportation arrangements needed? No  If their condition worsens, is the pt aware to call PCP or go to the Emergency Dept.? Yes Was the patient provided with contact information for the PCP's office or ED? Yes Was to pt encouraged to call back with questions or concerns? Yes  Tomasa Rand, RN, BSN, CEN [facility] [employer] [phone]

## 2021-06-08 DIAGNOSIS — Z79891 Long term (current) use of opiate analgesic: Secondary | ICD-10-CM | POA: Diagnosis not present

## 2021-06-08 DIAGNOSIS — G894 Chronic pain syndrome: Secondary | ICD-10-CM | POA: Diagnosis not present

## 2021-06-08 DIAGNOSIS — M4802 Spinal stenosis, cervical region: Secondary | ICD-10-CM | POA: Diagnosis not present

## 2021-06-08 DIAGNOSIS — M47812 Spondylosis without myelopathy or radiculopathy, cervical region: Secondary | ICD-10-CM | POA: Diagnosis not present

## 2021-06-22 DIAGNOSIS — M545 Low back pain, unspecified: Secondary | ICD-10-CM | POA: Diagnosis not present

## 2021-06-28 ENCOUNTER — Encounter: Payer: Self-pay | Admitting: Internal Medicine

## 2021-06-29 ENCOUNTER — Telehealth (INDEPENDENT_AMBULATORY_CARE_PROVIDER_SITE_OTHER): Payer: Medicare Other | Admitting: Internal Medicine

## 2021-06-29 ENCOUNTER — Encounter: Payer: Self-pay | Admitting: Internal Medicine

## 2021-06-29 DIAGNOSIS — R051 Acute cough: Secondary | ICD-10-CM

## 2021-06-29 DIAGNOSIS — I700 Atherosclerosis of aorta: Secondary | ICD-10-CM

## 2021-06-29 DIAGNOSIS — R059 Cough, unspecified: Secondary | ICD-10-CM | POA: Insufficient documentation

## 2021-06-29 MED ORDER — PREDNISONE 10 MG PO TABS: ORAL_TABLET | ORAL | 0 refills | Status: DC

## 2021-06-29 MED ORDER — DOXYCYCLINE HYCLATE 100 MG PO TABS: 100.0000 mg | ORAL_TABLET | Freq: Two times a day (BID) | ORAL | 0 refills | Status: DC

## 2021-06-29 NOTE — Telephone Encounter (Signed)
Scheduled with Dr Nicki Reaper for virtual today at 12. Confirmed no sob.

## 2021-06-29 NOTE — Progress Notes (Signed)
Patient ID: Mike Farley, male   DOB: Mar 07, 1951, 71 y.o.   MRN: [id number]   Virtual Visit via telephone Note  This visit type was conducted due to national recommendations for restrictions regarding the COVID-19 pandemic (e.g. social distancing).  This format is felt to be most appropriate for this patient at this time.  All issues noted in this document were discussed and addressed.  No physical exam was performed (except for noted visual exam findings with Video Visits).   I connected with Mike Farley by telephone and verified that I am speaking with the correct person using two identifiers. Location patient: home Location provider: work Persons participating in the telephone visit: patient, provider  The limitations, risks, security and privacy concerns of performing an evaluation and management service by telephone and the availability of in person appointments have been discussed.  It has also been discussed with the patient that there may be a patient responsible charge related to this service. The patient expressed understanding and agreed to proceed.   Reason for visit: work in appt  HPI: [place] with increased congestion and cough.  Symptoms started several days ago.  Reports decreased energy.  Increased congestion - blowing green/brown mucus.  Increased drainage.  No sore throat.  Increased cough - occasionally productive - thick mucus - colored mucus.  Having increased coughing fits.  No sob.  No fever.  No chest pain. No chest tightness.  No nausea, vomiting or diarrhea.  Taking mucinex, dayquil/nyquil and vitamin C.     ROS: See pertinent positives and negatives per HPI.  Past Medical History:  Diagnosis Date   Anxiety    Arthritis    shoulder   Cancer ([place])    prostate ca - Md just watching   Cervical pain (neck)    limited mobility   Chronic pain    Depression    years ago (minor)   HLD (hyperlipidemia)    Hx of cold sores    Mild cognitive impairment with  memory loss     Past Surgical History:  Procedure Laterality Date   ABDOMINAL EXPOSURE N/A 04/23/2021   Procedure: ABDOMINAL EXPOSURE;  Surgeon: [name], MD;  Location: [place];  Service: Vascular;  Laterality: N/A;   ANTERIOR FUSION CERVICAL SPINE  2011   4-5 levels   ANTERIOR LUMBAR FUSION N/A 04/23/2021   Procedure: Lumbar four-five,  Lumbar five-Sacral one Anterior Lumbar Interbody Fusion;  Surgeon: Karsten Ro, DO;  Location: [place];  Service: Neurosurgery;  Laterality: N/A;   APPENDECTOMY     BACK SURGERY     BONE EXCISION Left 09/28/2017   Procedure: BONE EXCISION-TAILORS  EXOSTECTOMY;  Surgeon: Samara Deist, DPM;  Location: [place];  Service: Podiatry;  Laterality: Left;  IVA LOCAL   CERVICAL DISCECTOMY  2008   C2-C7 Fused    CHOLECYSTECTOMY     COLONOSCOPY  2020   ELBOW FRACTURE SURGERY Left 2004   JOINT REPLACEMENT     right knee   LUMBAR PERCUTANEOUS PEDICLE SCREW 2 LEVEL N/A 04/23/2021   Procedure: LUMBAR PERCUTANEOUS PEDICLE SCREW LUMBAR FOUR-SACRAL ONE;  Surgeon: Karsten Ro, DO;  Location: [place];  Service: Neurosurgery;  Laterality: N/A;   METATARSAL HEAD EXCISION Left 07/04/2020   Procedure: METATARSAL HEAD EXCISION;  Surgeon: Samara Deist, DPM;  Location: [facility];  Service: Podiatry;  Laterality: Left;   PROSTATE BIOPSY N/A 03/15/2019   Procedure: PROSTATE BIOPSY Uro Nav;  Surgeon: Royston Cowper, MD;  Location: [facility];  Service: Urology;  Laterality: N/A;   RADIOLOGY WITH ANESTHESIA N/A 04/22/2020   Procedure: MRI WITH ANESTHESIA BRAIN WITHOUT CONTRAST;  Surgeon: Radiologist, Medication, MD;  Location: [place];  Service: Radiology;  Laterality: N/A;   SHOULDER SURGERY Right    TONSILLECTOMY AND ADENOIDECTOMY     TOTAL KNEE ARTHROPLASTY Right 2005   x 4    Family History  Problem Relation Age of Onset   Arthritis Mother    Cancer Mother        colon & breast cnacer   Mental illness Mother        Depression & bipolar   Cancer  Father        lung & prostate cancer   Alcohol abuse Brother    Kidney disease Brother     SOCIAL HX: reviewed.    Current Outpatient Medications:    doxycycline (VIBRA-TABS) 100 MG tablet, Take 1 tablet (100 mg total) by mouth 2 (two) times daily., Disp: 20 tablet, Rfl: 0   predniSONE (DELTASONE) 10 MG tablet, Take 4 tablets x 1 day and then decrease by 1/2 tablet per day until down to zero mg., Disp: 18 tablet, Rfl: 0   cholecalciferol (VITAMIN D3) 25 MCG (1000 UT) tablet, Take 1,000 Units by mouth 3 (three) times a week. , Disp: , Rfl:    clindamycin (CLEOCIN) 300 MG capsule, Take 600 mg by mouth See admin instructions. Take 600 mg by mouth prior to dental procedures, Disp: , Rfl:    cyanocobalamin 1000 MCG tablet, Take 1,000 mcg by mouth daily. , Disp: , Rfl:    donepezil (ARICEPT) 5 MG tablet, Take 5 mg by mouth daily., Disp: , Rfl:    Melatonin 10 MG TABS, Take 20 mg by mouth at bedtime., Disp: , Rfl:    methocarbamol (ROBAXIN) 500 MG tablet, Take 500 mg by mouth 3 (three) times daily., Disp: , Rfl:    Multiple Vitamin (MULTIVITAMIN) tablet, Take 1 tablet by mouth daily. ABC Complete MEN, Disp: , Rfl:    oxyCODONE (ROXICODONE) 15 MG immediate release tablet, Take 15 mg by mouth every 4 (four) hours as needed for pain., Disp: , Rfl:    oxymetazoline (AFRIN) 0.05 % nasal spray, Place 1 spray into both nostrils at bedtime., Disp: , Rfl:    QUEtiapine (SEROQUEL) 300 MG tablet, TAKE ONE TABLET AT BEDTIME., Disp: 90 tablet, Rfl: 1   rosuvastatin (CRESTOR) 10 MG tablet, TAKE 1 TABLET BY MOUTH DAILY, Disp: 30 tablet, Rfl: 5   tamsulosin (FLOMAX) 0.4 MG CAPS capsule, Take 0.4 mg by mouth daily., Disp: , Rfl:   EXAM:  GENERAL: alert. Sounds to be in no acute distress.  Answering questions appropriately.   LUNGS: increased cough with forced expiration.   PSYCH/NEURO: pleasant and cooperative, no obvious depression or anxiety, speech and thought processing grossly intact  ASSESSMENT AND  PLAN:  Discussed the following assessment and plan:  Problem List Items Addressed This Visit     Aortic atherosclerosis ([place])    Continue crestor.        Cough    Cough - increased nasal congestion, drainage, cough and congestion. Appears to be c/w sinus infection/uri.   Treat with doxycycline and probiotics as directed.  Saline nasal spray and steroid nasal spray.  Mucinex.  Prednisone taper as directed. Nasal swab for RSV, covid and flu.  Quarantine until result available.   Follow.  Call with update.        Relevant Orders   COVID-19, Flu A+B and  RSV (Completed)    Return if symptoms worsen or fail to improve.   I discussed the assessment and treatment plan with the patient. The patient was provided an opportunity to ask questions and all were answered. The patient agreed with the plan and demonstrated an understanding of the instructions.   The patient was advised to call back or seek an in-person evaluation if the symptoms worsen or if the condition fails to improve as anticipated.  I provided  23 minutes of non-face-to-face time during this encounter.   Einar Pheasant, MD

## 2021-06-30 LAB — COVID-19, FLU A+B AND RSV
Influenza A, NAA: NOT DETECTED
Influenza B, NAA: NOT DETECTED
RSV, NAA: NOT DETECTED
SARS-CoV-2, NAA: NOT DETECTED

## 2021-07-05 ENCOUNTER — Encounter: Payer: Self-pay | Admitting: Internal Medicine

## 2021-07-05 NOTE — Assessment & Plan Note (Signed)
Continue crestor 

## 2021-07-05 NOTE — Assessment & Plan Note (Addendum)
Cough - increased nasal congestion, drainage, cough and congestion. Appears to be c/w sinus infection/uri.   Treat with doxycycline and probiotics as directed.  Saline nasal spray and steroid nasal spray.  Mucinex.  Prednisone taper as directed. Nasal swab for RSV, covid and flu.  Quarantine until result available.   Follow.  Call with update.

## 2021-07-06 ENCOUNTER — Encounter: Payer: Self-pay | Admitting: Internal Medicine

## 2021-07-07 ENCOUNTER — Ambulatory Visit (INDEPENDENT_AMBULATORY_CARE_PROVIDER_SITE_OTHER): Payer: Medicare Other | Admitting: Internal Medicine

## 2021-07-07 ENCOUNTER — Encounter: Payer: Self-pay | Admitting: Internal Medicine

## 2021-07-07 DIAGNOSIS — R053 Chronic cough: Secondary | ICD-10-CM | POA: Diagnosis not present

## 2021-07-07 DIAGNOSIS — R739 Hyperglycemia, unspecified: Secondary | ICD-10-CM

## 2021-07-07 MED ORDER — CEFDINIR 300 MG PO CAPS: 300.0000 mg | ORAL_CAPSULE | Freq: Two times a day (BID) | ORAL | 0 refills | Status: DC

## 2021-07-07 MED ORDER — PREDNISONE 10 MG PO TABS: ORAL_TABLET | ORAL | 0 refills | Status: DC

## 2021-07-07 NOTE — Telephone Encounter (Signed)
Called patient. Offered virtual appt. Patient is out of town right now. Heading back today. Patient is going to call back and let me know if he would like appt or not.

## 2021-07-07 NOTE — Telephone Encounter (Signed)
Spoke with patient. Confirmed no sob. Chest pain, etc. Was evaluated and swabbed recently but is not noticing significant improvement. Offered virtual visit today but patient says he is out of town- heading back this PM. Scheduled for 3:30.

## 2021-07-07 NOTE — Progress Notes (Deleted)
Patient ID: Mike Farley, male   DOB: 1950/07/24, 71 y.o.   MRN: [id number]   Subjective:    Patient ID: Mike Farley, male    DOB: 02/08/1951, 71 y.o.   MRN: [id number]  This visit occurred during the SARS-CoV-2 public health emergency.  Safety protocols were in place, including screening questions prior to the visit, additional usage of staff PPE, and extensive cleaning of exam room while observing appropriate contact time as indicated for disinfecting solutions.   Patient here for No chief complaint on file.  Marland Kitchen   HPI    Past Medical History:  Diagnosis Date   Anxiety    Arthritis    shoulder   Cancer ([place])    prostate ca - Md just watching   Cervical pain (neck)    limited mobility   Chronic pain    Depression    years ago (minor)   HLD (hyperlipidemia)    Hx of cold sores    Mild cognitive impairment with memory loss    Past Surgical History:  Procedure Laterality Date   ABDOMINAL EXPOSURE N/A 04/23/2021   Procedure: ABDOMINAL EXPOSURE;  Surgeon: [name], MD;  Location: [place];  Service: Vascular;  Laterality: N/A;   ANTERIOR FUSION CERVICAL SPINE  2011   4-5 levels   ANTERIOR LUMBAR FUSION N/A 04/23/2021   Procedure: Lumbar four-five,  Lumbar five-Sacral one Anterior Lumbar Interbody Fusion;  Surgeon: Karsten Ro, DO;  Location: [place];  Service: Neurosurgery;  Laterality: N/A;   APPENDECTOMY     BACK SURGERY     BONE EXCISION Left 09/28/2017   Procedure: BONE EXCISION-TAILORS  EXOSTECTOMY;  Surgeon: Samara Deist, DPM;  Location: [place];  Service: Podiatry;  Laterality: Left;  IVA LOCAL   CERVICAL DISCECTOMY  2008   C2-C7 Fused    CHOLECYSTECTOMY     COLONOSCOPY  2020   ELBOW FRACTURE SURGERY Left 2004   JOINT REPLACEMENT     right knee   LUMBAR PERCUTANEOUS PEDICLE SCREW 2 LEVEL N/A 04/23/2021   Procedure: LUMBAR PERCUTANEOUS PEDICLE SCREW LUMBAR FOUR-SACRAL ONE;  Surgeon: Karsten Ro, DO;  Location: [place];  Service:  Neurosurgery;  Laterality: N/A;   METATARSAL HEAD EXCISION Left 07/04/2020   Procedure: METATARSAL HEAD EXCISION;  Surgeon: Samara Deist, DPM;  Location: [facility];  Service: Podiatry;  Laterality: Left;   PROSTATE BIOPSY N/A 03/15/2019   Procedure: PROSTATE BIOPSY Uro Nav;  Surgeon: Royston Cowper, MD;  Location: [facility];  Service: Urology;  Laterality: N/A;   RADIOLOGY WITH ANESTHESIA N/A 04/22/2020   Procedure: MRI WITH ANESTHESIA BRAIN WITHOUT CONTRAST;  Surgeon: Radiologist, Medication, MD;  Location: [place];  Service: Radiology;  Laterality: N/A;   SHOULDER SURGERY Right    TONSILLECTOMY AND ADENOIDECTOMY     TOTAL KNEE ARTHROPLASTY Right 2005   x 4   Family History  Problem Relation Age of Onset   Arthritis Mother    Cancer Mother        colon & breast cnacer   Mental illness Mother        Depression & bipolar   Cancer Father        lung & prostate cancer   Alcohol abuse Brother    Kidney disease Brother    Social History   Socioeconomic History   Marital status: Married    Spouse name: Not on file   Number of children: Not on file   Years of education: Not on file  Highest education level: Not on file  Occupational History   Not on file  Tobacco Use   Smoking status: Former   Smokeless tobacco: Current    Types: Chew   Tobacco comments:    Quit yrs ago  Vaping Use   Vaping Use: Never used  Substance and Sexual Activity   Alcohol use: Yes    Alcohol/week: 7.0 standard drinks    Types: 7 Standard drinks or equivalent per week    Comment: beer/wine   Drug use: Never   Sexual activity: Not Currently  Other Topics Concern   Not on file  Social History Narrative   Not on file   Social Determinants of Health   Financial Resource Strain: Low Risk    Difficulty of Paying Living Expenses: Not hard at all  Food Insecurity: No Food Insecurity   Worried About Charity fundraiser in the Last Year: Never true   [place] in the Last Year: Never true   Transportation Needs: No Transportation Needs   Lack of Transportation (Medical): No   Lack of Transportation (Non-Medical): No  Physical Activity: Not on file  Stress: No Stress Concern Present   Feeling of Stress : Not at all  Social Connections: Unknown   Frequency of Communication with Friends and Family: Not on file   Frequency of Social Gatherings with Friends and Family: Not on file   Attends Religious Services: Not on file   Active Member of Clubs or Organizations: Not on file   Attends Archivist Meetings: Not on file   Marital Status: Married     Review of Systems     Objective:     There were no vitals taken for this visit. Wt Readings from Last 3 Encounters:  04/23/21 167 lb (75.8 kg)  04/21/21 (P) 168 lb 14.4 oz (76.6 kg)  03/31/21 166 lb (75.3 kg)    Physical Exam   Outpatient Encounter Medications as of 07/07/2021  Medication Sig   cholecalciferol (VITAMIN D3) 25 MCG (1000 UT) tablet Take 1,000 Units by mouth 3 (three) times a week.    clindamycin (CLEOCIN) 300 MG capsule Take 600 mg by mouth See admin instructions. Take 600 mg by mouth prior to dental procedures   cyanocobalamin 1000 MCG tablet Take 1,000 mcg by mouth daily.    donepezil (ARICEPT) 5 MG tablet Take 5 mg by mouth daily.   doxycycline (VIBRA-TABS) 100 MG tablet Take 1 tablet (100 mg total) by mouth 2 (two) times daily.   Melatonin 10 MG TABS Take 20 mg by mouth at bedtime.   methocarbamol (ROBAXIN) 500 MG tablet Take 500 mg by mouth 3 (three) times daily.   Multiple Vitamin (MULTIVITAMIN) tablet Take 1 tablet by mouth daily. ABC Complete MEN   oxyCODONE (ROXICODONE) 15 MG immediate release tablet Take 15 mg by mouth every 4 (four) hours as needed for pain.   oxymetazoline (AFRIN) 0.05 % nasal spray Place 1 spray into both nostrils at bedtime.   predniSONE (DELTASONE) 10 MG tablet Take 4 tablets x 1 day and then decrease by 1/2 tablet per day until down to zero mg.   QUEtiapine  (SEROQUEL) 300 MG tablet TAKE ONE TABLET AT BEDTIME.   rosuvastatin (CRESTOR) 10 MG tablet TAKE 1 TABLET BY MOUTH DAILY   tamsulosin (FLOMAX) 0.4 MG CAPS capsule Take 0.4 mg by mouth daily.   No facility-administered encounter medications on file as of 07/07/2021.     Lab Results  Component Value  Date   WBC 9.5 04/24/2021   HGB 10.2 (L) 04/24/2021   HCT 30.0 (L) 04/24/2021   PLT 184 04/24/2021   GLUCOSE 116 (H) 04/24/2021   CHOL 160 03/30/2021   TRIG 107.0 03/30/2021   HDL 74.60 03/30/2021   LDLCALC 64 03/30/2021   ALT 21 03/30/2021   AST 23 03/30/2021   NA 138 04/24/2021   K 4.2 04/24/2021   CL 107 04/24/2021   CREATININE 0.87 04/24/2021   BUN 14 04/24/2021   CO2 27 04/24/2021   TSH 1.96 03/30/2021   PSA 9.28 (H) 03/31/2021   INR 1.03 05/29/2009   HGBA1C 5.8 03/30/2021    DG Lumbar Spine 2-3 Views  Result Date: 04/23/2021 CLINICAL DATA:  L4 through S1 posterior fusion EXAM: LUMBAR SPINE - 2-3 VIEW COMPARISON:  Lumbar spine radiographs 03/03/2021, lumbar spine MRI 02/22/2021 FINDINGS: Two C-arm fluoroscopic images were obtained intraoperatively and submitted for post operative interpretation. Postsurgical changes reflecting L4 through S1 posterior fusion with interbody spacers are seen. Hardware alignment is within expected limits, without evidence of immediate complication. Fluoro time 5 minutes 4 seconds. Please see the performing provider's procedural report for further detail. IMPRESSION: Status post L4 through S1 fusion without evidence of immediate complication. Electronically Signed   By: Valetta Mole M.D.   On: 04/23/2021 15:02   DG C-Arm 1-60 Min-No Report  Result Date: 04/23/2021 Fluoroscopy was utilized by the requesting physician.  No radiographic interpretation.   DG C-Arm 1-60 Min-No Report  Result Date: 04/23/2021 Fluoroscopy was utilized by the requesting physician.  No radiographic interpretation.   DG C-Arm 1-60 Min-No Report  Result Date:  04/23/2021 Fluoroscopy was utilized by the requesting physician.  No radiographic interpretation.   DG C-Arm 1-60 Min-No Report  Result Date: 04/23/2021 Fluoroscopy was utilized by the requesting physician.  No radiographic interpretation.   DG C-Arm 1-60 Min-No Report  Result Date: 04/23/2021 Fluoroscopy was utilized by the requesting physician.  No radiographic interpretation.   DG C-Arm 1-60 Min-No Report  Result Date: 04/23/2021 Fluoroscopy was utilized by the requesting physician.  No radiographic interpretation.   DG OR LOCAL ABDOMEN  Result Date: 04/23/2021 CLINICAL DATA:  Retained foreign body. EXAM: OR LOCAL ABDOMEN COMPARISON:  None. FINDINGS: Status post surgical fusion of L4-5 and L5-S1 disc spaces. No abnormal bowel dilatation is noted. No other definite radiopaque foreign body is noted. IMPRESSION: Status post surgical fusion of L4-5 and L5-S1 disc spaces. No other definite radiopaque foreign body is noted. These results were called by telephone at the time of interpretation on 04/23/2021 at 12:03 pm to provider Sarah in [place] [address], who verbally acknowledged these results. Electronically Signed   By: Marijo Conception M.D.   On: 04/23/2021 12:04       Assessment & Plan:   Problem List Items Addressed This Visit   None    Einar Pheasant, MD

## 2021-07-08 ENCOUNTER — Ambulatory Visit
Admission: RE | Admit: 2021-07-08 | Discharge: 2021-07-08 | Disposition: A | Discharge status: Home / Self Care | Payer: Medicare Other | Attending: Internal Medicine | Admitting: Internal Medicine

## 2021-07-08 ENCOUNTER — Ambulatory Visit
Admission: RE | Admit: 2021-07-08 | Discharge: 2021-07-08 | Disposition: A | Discharge status: Home / Self Care | Payer: Medicare Other | Source: Ambulatory Visit | Attending: Internal Medicine | Admitting: Internal Medicine

## 2021-07-08 ENCOUNTER — Encounter: Payer: Self-pay | Admitting: Internal Medicine

## 2021-07-08 DIAGNOSIS — R0602 Shortness of breath: Secondary | ICD-10-CM | POA: Diagnosis not present

## 2021-07-08 DIAGNOSIS — R059 Cough, unspecified: Secondary | ICD-10-CM | POA: Diagnosis not present

## 2021-07-08 DIAGNOSIS — R053 Chronic cough: Secondary | ICD-10-CM | POA: Insufficient documentation

## 2021-07-08 NOTE — Progress Notes (Signed)
Patient ID: Mike Farley, male   DOB: 02/15/1951, 71 y.o.   MRN: [id number]   Virtual Visit via video Note  This visit type was conducted due to national recommendations for restrictions regarding the COVID-19 pandemic (e.g. social distancing).  This format is felt to be most appropriate for this patient at this time.  All issues noted in this document were discussed and addressed.  No physical exam was performed (except for noted visual exam findings with Video Visits).   I connected with Mike Farley by a video enabled telemedicine application and verified that I am speaking with the correct person using two identifiers. Location patient: home Location provider: work  Persons participating in the telephone visit: patient, provider  The limitations, risks, security and privacy concerns of performing an evaluation and management service by telephone and the availability of in person appointments have been discussed.  It has also been discussed with the patient that there may be a patient responsible charge related to this service. The patient expressed understanding and agreed to proceed.   Reason for visit: work in appt  HPI: Work in appt for persistent cough and congestion.  Was evaluated 06/29/21 for cough and congestion.  Given doxycycline and short prednisone taper.  Called in today - still feels bad.  States he may be 30% better.  Reports persistent decreased energy.  Still with increased sinus pressure and nasal mucus - brown/green mucus.  Also reports some chest congestion and persistent increased cough.  No chest pain or sob.  Last day for doxycycline is tomorrow.  Has completed prednisone taper.  Taking mucinex.  No nausea or vomiting.  No diarrhea.  Tested negative for flu, RSV and covid.   ROS: See pertinent positives and negatives per HPI.  Past Medical History:  Diagnosis Date   Anxiety    Arthritis    shoulder   Cancer ([place])    prostate ca - Md just watching    Cervical pain (neck)    limited mobility   Chronic pain    Depression    years ago (minor)   HLD (hyperlipidemia)    Hx of cold sores    Mild cognitive impairment with memory loss     Past Surgical History:  Procedure Laterality Date   ABDOMINAL EXPOSURE N/A 04/23/2021   Procedure: ABDOMINAL EXPOSURE;  Surgeon: [name], MD;  Location: [place];  Service: Vascular;  Laterality: N/A;   ANTERIOR FUSION CERVICAL SPINE  2011   4-5 levels   ANTERIOR LUMBAR FUSION N/A 04/23/2021   Procedure: Lumbar four-five,  Lumbar five-Sacral one Anterior Lumbar Interbody Fusion;  Surgeon: Karsten Ro, DO;  Location: [place];  Service: Neurosurgery;  Laterality: N/A;   APPENDECTOMY     BACK SURGERY     BONE EXCISION Left 09/28/2017   Procedure: BONE EXCISION-TAILORS  EXOSTECTOMY;  Surgeon: Samara Deist, DPM;  Location: [place];  Service: Podiatry;  Laterality: Left;  IVA LOCAL   CERVICAL DISCECTOMY  2008   C2-C7 Fused    CHOLECYSTECTOMY     COLONOSCOPY  2020   ELBOW FRACTURE SURGERY Left 2004   JOINT REPLACEMENT     right knee   LUMBAR PERCUTANEOUS PEDICLE SCREW 2 LEVEL N/A 04/23/2021   Procedure: LUMBAR PERCUTANEOUS PEDICLE SCREW LUMBAR FOUR-SACRAL ONE;  Surgeon: Karsten Ro, DO;  Location: [place];  Service: Neurosurgery;  Laterality: N/A;   METATARSAL HEAD EXCISION Left 07/04/2020   Procedure: METATARSAL HEAD EXCISION;  Surgeon: Samara Deist, DPM;  Location: [facility];  Service: Podiatry;  Laterality: Left;   PROSTATE BIOPSY N/A 03/15/2019   Procedure: PROSTATE BIOPSY Uro Nav;  Surgeon: Royston Cowper, MD;  Location: [facility];  Service: Urology;  Laterality: N/A;   RADIOLOGY WITH ANESTHESIA N/A 04/22/2020   Procedure: MRI WITH ANESTHESIA BRAIN WITHOUT CONTRAST;  Surgeon: Radiologist, Medication, MD;  Location: [place];  Service: Radiology;  Laterality: N/A;   SHOULDER SURGERY Right    TONSILLECTOMY AND ADENOIDECTOMY     TOTAL KNEE ARTHROPLASTY Right 2005   x 4     Family History  Problem Relation Age of Onset   Arthritis Mother    Cancer Mother        colon & breast cnacer   Mental illness Mother        Depression & bipolar   Cancer Father        lung & prostate cancer   Alcohol abuse Brother    Kidney disease Brother     SOCIAL HX: reviewed.    Current Outpatient Medications:    cefdinir (OMNICEF) 300 MG capsule, Take 1 capsule (300 mg total) by mouth 2 (two) times daily., Disp: 20 capsule, Rfl: 0   cholecalciferol (VITAMIN D3) 25 MCG (1000 UT) tablet, Take 1,000 Units by mouth 3 (three) times a week. , Disp: , Rfl:    clindamycin (CLEOCIN) 300 MG capsule, Take 600 mg by mouth See admin instructions. Take 600 mg by mouth prior to dental procedures, Disp: , Rfl:    cyanocobalamin 1000 MCG tablet, Take 1,000 mcg by mouth daily. , Disp: , Rfl:    donepezil (ARICEPT) 5 MG tablet, Take 5 mg by mouth daily., Disp: , Rfl:    doxycycline (VIBRA-TABS) 100 MG tablet, Take 1 tablet (100 mg total) by mouth 2 (two) times daily., Disp: 20 tablet, Rfl: 0   Melatonin 10 MG TABS, Take 20 mg by mouth at bedtime., Disp: , Rfl:    methocarbamol (ROBAXIN) 500 MG tablet, Take 500 mg by mouth 3 (three) times daily., Disp: , Rfl:    Multiple Vitamin (MULTIVITAMIN) tablet, Take 1 tablet by mouth daily. ABC Complete MEN, Disp: , Rfl:    oxyCODONE (ROXICODONE) 15 MG immediate release tablet, Take 15 mg by mouth every 4 (four) hours as needed for pain., Disp: , Rfl:    oxymetazoline (AFRIN) 0.05 % nasal spray, Place 1 spray into both nostrils at bedtime., Disp: , Rfl:    predniSONE (DELTASONE) 10 MG tablet, Take 6 tablets x 1 day and then decrease by 1/2 tablet per day until down to zero mg., Disp: 39 tablet, Rfl: 0   QUEtiapine (SEROQUEL) 300 MG tablet, TAKE ONE TABLET AT BEDTIME., Disp: 90 tablet, Rfl: 1   rosuvastatin (CRESTOR) 10 MG tablet, TAKE 1 TABLET BY MOUTH DAILY, Disp: 30 tablet, Rfl: 5   tamsulosin (FLOMAX) 0.4 MG CAPS capsule, Take 0.4 mg by mouth  daily., Disp: , Rfl:   EXAM:  GENERAL: alert. Sounds to be in no acute distress.  Answering questions appropriately.   PSYCH/NEURO: pleasant and cooperative, no obvious depression or anxiety, speech and thought processing grossly intact  ASSESSMENT AND PLAN:  Discussed the following assessment and plan:  Problem List Items Addressed This Visit     Hyperglycemia    Stay hydrated.  Follow.        Persistent cough    Persistent increased cough and congestion as outlined.  Recently treated with short prednisone taper and doxycyline.  Persistent increased colored mucus and sinus  pressure as well.  No chest pain or sob.  Given persistent symptoms and persistent increased cough, will check cxr.  Recent covid, flu and RSV negative.  Prescribed another prednisone taper and omnicef as directed.  Continue probiotic.  Continue mucinex and nasal sprays.  Follow.  Call with update.       Relevant Orders   DG Chest 2 View    Return if symptoms worsen or fail to improve.   I discussed the assessment and treatment plan with the patient. The patient was provided an opportunity to ask questions and all were answered. The patient agreed with the plan and demonstrated an understanding of the instructions.   The patient was advised to call back or seek an in-person evaluation if the symptoms worsen or if the condition fails to improve as anticipated.  I provided 23 minutes of non-face-to-face time during this encounter.   Einar Pheasant, MD

## 2021-07-08 NOTE — Assessment & Plan Note (Signed)
Stay hydrated.  Follow.  

## 2021-07-08 NOTE — Assessment & Plan Note (Signed)
Persistent increased cough and congestion as outlined.  Recently treated with short prednisone taper and doxycyline.  Persistent increased colored mucus and sinus pressure as well.  No chest pain or sob.  Given persistent symptoms and persistent increased cough, will check cxr.  Recent covid, flu and RSV negative.  Prescribed another prednisone taper and omnicef as directed.  Continue probiotic.  Continue mucinex and nasal sprays.  Follow.  Call with update.

## 2021-07-29 ENCOUNTER — Other Ambulatory Visit: Payer: Self-pay | Admitting: Internal Medicine

## 2021-08-03 DIAGNOSIS — Z79891 Long term (current) use of opiate analgesic: Secondary | ICD-10-CM | POA: Diagnosis not present

## 2021-08-03 DIAGNOSIS — G894 Chronic pain syndrome: Secondary | ICD-10-CM | POA: Diagnosis not present

## 2021-08-03 DIAGNOSIS — M5416 Radiculopathy, lumbar region: Secondary | ICD-10-CM | POA: Diagnosis not present

## 2021-08-03 DIAGNOSIS — Z13828 Encounter for screening for other musculoskeletal disorder: Secondary | ICD-10-CM | POA: Diagnosis not present

## 2021-08-03 DIAGNOSIS — M4802 Spinal stenosis, cervical region: Secondary | ICD-10-CM | POA: Diagnosis not present

## 2021-08-03 DIAGNOSIS — M47812 Spondylosis without myelopathy or radiculopathy, cervical region: Secondary | ICD-10-CM | POA: Diagnosis not present

## 2021-08-03 DIAGNOSIS — M48061 Spinal stenosis, lumbar region without neurogenic claudication: Secondary | ICD-10-CM | POA: Diagnosis not present

## 2021-08-07 ENCOUNTER — Telehealth (INDEPENDENT_AMBULATORY_CARE_PROVIDER_SITE_OTHER): Payer: Medicare Other | Admitting: Internal Medicine

## 2021-08-07 ENCOUNTER — Encounter: Payer: Self-pay | Admitting: Internal Medicine

## 2021-08-07 VITALS — Ht 70.0 in | Wt 167.0 lb

## 2021-08-07 DIAGNOSIS — J329 Chronic sinusitis, unspecified: Secondary | ICD-10-CM | POA: Diagnosis not present

## 2021-08-07 DIAGNOSIS — J0101 Acute recurrent maxillary sinusitis: Secondary | ICD-10-CM

## 2021-08-07 MED ORDER — PREDNISONE 10 MG PO TABS: ORAL_TABLET | ORAL | 0 refills | Status: DC

## 2021-08-07 MED ORDER — LEVOFLOXACIN 500 MG PO TABS: 500.0000 mg | ORAL_TABLET | Freq: Every day | ORAL | 0 refills | Status: AC

## 2021-08-07 NOTE — Telephone Encounter (Signed)
Scheduled for VV with Dr Derrel Nip ?

## 2021-08-07 NOTE — Progress Notes (Signed)
Virtual Visit via Caregility Note ? ?This visit type was conducted due to national recommendations for restrictions regarding the COVID-19 pandemic (e.g. social distancing).  This format is felt to be most appropriate for this patient at this time.  All issues noted in this document were discussed and addressed.  No physical exam was performed (except for noted visual exam findings with Video Visits).  ? ?I connected withNAME@ on 08/07/21 at  4:45 PM EDT by a video enabled telemedicine application and verified that I am speaking with the correct person using two identifiers. ?Location patient: home ?Location provider: work or home office ?Persons participating in the virtual visit: patient, provider ? ?I discussed the limitations, risks, security and privacy concerns of performing an evaluation and management service by telephone and the availability of in person appointments. I also discussed with the patient that there may be a patient responsible charge related to this service. The patient expressed understanding and agreed to proceed. ? ? ?Reason for visit: recurrent sinus congestion  ? ?HPI: ? ?71 yr old male  presents with recurrent sinusitis.  Treated on Feb 6 with doxycycline for URI. Sympotms did not improve.  Cefdinir and prednisone taper started Feb 14;  chest  ray was done; no pneumonia. Symptoms resolved for 3 weeks.  Have been present for the ast 7 days despite aggressive otc therapy using allegra, mucinex, nasocort and  saline lavage via nettie pott twice daily.  Right side has been persistently more congested than the  left side.  Fatigue and malaiise without fevers. No pleurisy.  Nasal drainage is greenish/brown,  ? ? ?ROS: See pertinent positives and negatives per HPI. ? ?Past Medical History:  ?Diagnosis Date  ? Anxiety   ? 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 Cancer [facility])   ? prostate ca - Md just watching  ? Cervical pain (neck)   ? limited mobility  ? Chronic pain   ? Depression   ? years ago (minor)  ?  HLD (hyperlipidemia)   ? Hx of cold sores   ? Mild cognitive impairment with memory loss   ? ? ?Past Surgical History:  ?Procedure Laterality Date  ? ABDOMINAL EXPOSURE N/A 04/23/2021  ? Procedure: ABDOMINAL EXPOSURE;  Surgeon: [name], MD;  Location: [place];  Service: Vascular;  Laterality: N/A;  ? ANTERIOR FUSION CERVICAL SPINE  2011  ? 4-5 levels  ? ANTERIOR LUMBAR FUSION N/A 04/23/2021  ? Procedure: Lumbar four-five,  Lumbar five-Sacral one Anterior Lumbar Interbody Fusion;  Surgeon: Dawley, Theodoro Doing, DO;  Location: [place];  Service: Neurosurgery;  Laterality: N/A;  ? APPENDECTOMY    ? BACK SURGERY    ? BONE EXCISION Left 09/28/2017  ? Procedure: BONE EXCISION-TAILORS  EXOSTECTOMY;  Surgeon: Samara Deist, DPM;  Location: [place];  Service: Podiatry;  Laterality: Left;  IVA LOCAL  ? CERVICAL DISCECTOMY  2008  ? C2-C7 Fused   ? CHOLECYSTECTOMY    ? COLONOSCOPY  2020  ? ELBOW FRACTURE SURGERY Left 2004  ? JOINT REPLACEMENT    ? right knee  ? LUMBAR PERCUTANEOUS PEDICLE SCREW 2 LEVEL N/A 04/23/2021  ? Procedure: LUMBAR PERCUTANEOUS PEDICLE SCREW LUMBAR FOUR-SACRAL ONE;  Surgeon: Karsten Ro, DO;  Location: [place];  Service: Neurosurgery;  Laterality: N/A;  ? METATARSAL HEAD EXCISION Left 07/04/2020  ? Procedure: METATARSAL HEAD EXCISION;  Surgeon: Samara Deist, DPM;  Location: [facility];  Service: Podiatry;  Laterality: Left;  ? PROSTATE BIOPSY N/A 03/15/2019  ? Procedure: PROSTATE  BIOPSY Uro Nav;  Surgeon: Royston Cowper, MD;  Location: [facility];  Service: Urology;  Laterality: N/A;  ? RADIOLOGY WITH ANESTHESIA N/A 04/22/2020  ? Procedure: MRI WITH ANESTHESIA BRAIN WITHOUT CONTRAST;  Surgeon: Radiologist, Medication, MD;  Location: [place];  Service: Radiology;  Laterality: N/A;  ? SHOULDER SURGERY Right   ? TONSILLECTOMY AND ADENOIDECTOMY    ? TOTAL KNEE ARTHROPLASTY Right 2005  ? x 4  ? ? ?Family History  ?Problem Relation Age of Onset  ? Arthritis Mother   ? Cancer Mother   ?     colon &  breast cnacer  ? Mental illness Mother   ?     Depression & bipolar  ? Cancer Father   ?     lung & prostate cancer  ? Alcohol abuse Brother   ? Kidney disease Brother   ? ? ?SOCIAL HX:  reports that he has quit smoking. His smokeless tobacco use includes chew. He reports current alcohol use of about 7.0 standard drinks per week. He reports that he does not use drugs.  ? ? ?Current Outpatient Medications:  ?  cholecalciferol (VITAMIN D3) 25 MCG (1000 UT) tablet, Take 1,000 Units by mouth 3 (three) times a week. , Disp: , Rfl:  ?  clindamycin (CLEOCIN) 300 MG capsule, Take 600 mg by mouth See admin instructions. Take 600 mg by mouth prior to dental procedures, Disp: , Rfl:  ?  cyanocobalamin 1000 MCG tablet, Take 1,000 mcg by mouth daily. , Disp: , Rfl:  ?  donepezil (ARICEPT) 5 MG tablet, Take 5 mg by mouth daily., Disp: , Rfl:  ?  levofloxacin (LEVAQUIN) 500 MG tablet, Take 1 tablet (500 mg total) by mouth daily for 7 days., Disp: 7 tablet, Rfl: 0 ?  Melatonin 10 MG TABS, Take 20 mg by mouth at bedtime., Disp: , Rfl:  ?  methocarbamol (ROBAXIN) 500 MG tablet, Take 500 mg by mouth 3 (three) times daily., Disp: , Rfl:  ?  Multiple Vitamin (MULTIVITAMIN) tablet, Take 1 tablet by mouth daily. ABC Complete MEN, Disp: , Rfl:  ?  oxyCODONE (ROXICODONE) 15 MG immediate release tablet, Take 15 mg by mouth every 4 (four) hours as needed for pain., Disp: , Rfl:  ?  oxymetazoline (AFRIN) 0.05 % nasal spray, Place 1 spray into both nostrils at bedtime., Disp: , Rfl:  ?  predniSONE (DELTASONE) 10 MG tablet, 6 tablets on Day 1 , then reduce by 1 tablet daily until gone, Disp: 21 tablet, Rfl: 0 ?  QUEtiapine (SEROQUEL) 300 MG tablet, TAKE ONE TABLET AT BEDTIME., Disp: 90 tablet, Rfl: 1 ?  rosuvastatin (CRESTOR) 10 MG tablet, TAKE ONE TABLET (10 MG) BY MOUTH EVERY DAY, Disp: 30 tablet, Rfl: 5 ?  tamsulosin (FLOMAX) 0.4 MG CAPS capsule, Take 0.4 mg by mouth daily., Disp: , Rfl:  ? ?EXAM: ? ?VITALS per patient if  applicable: ? ?GENERAL: alert, oriented, appears well and in no acute distress ? ?HEENT: atraumatic, conjunttiva clear, no obvious abnormalities on inspection of external nose and ears ? ?NECK: normal movements of the head and neck ? ?LUNGS: on inspection no signs of respiratory distress, breathing rate appears normal, no obvious gross SOB, gasping or wheezing ? ?CV: no obvious cyanosis ? ?MS: moves all visible extremities without noticeable abnormality ? ?PSYCH/NEURO: pleasant and cooperative, no obvious depression or anxiety, speech and thought processing grossly intact ? ?ASSESSMENT AND PLAN: ? ?Discussed the following assessment and plan: ? ?Acute recurrent maxillary sinusitis - Plan:  Ambulatory referral to ENT ? ?Recurrent sinusitis ? ?Recurrent sinusitis ?He presents with recurrent symptoms of purulent sinus discharge and congestion lasting over 7 days, that started 3 weeks after his second round of antibiotics.  Following doxycycline and cefdinir,  Will use  levaquin  And prednisone and refer to ENT for evaluation  ? ?  ?I discussed the assessment and treatment plan with the patient. The patient was provided an opportunity to ask questions and all were answered. The patient agreed with the plan and demonstrated an understanding of the instructions. ?  ?The patient was advised to call back or seek an in-person evaluation if the symptoms worsen or if the condition fails to improve as anticipated. ? ? ?I spent 30 minutes dedicated to the care of this patient on the date of this encounter to include pre-visit review of his medical history,  Face-to-face time with the patient , and post visit ordering of testing and therapeutics.  ? ? ?Crecencio Mc, MD   ?

## 2021-08-09 ENCOUNTER — Encounter: Payer: Self-pay | Admitting: Internal Medicine

## 2021-08-09 NOTE — Assessment & Plan Note (Signed)
He presents with recurrent symptoms of purulent sinus discharge and congestion lasting over 7 days, that started 3 weeks after his second round of antibiotics.  Following doxycycline and cefdinir,  Will use  levaquin  And prednisone and refer to ENT for evaluation  ?

## 2021-09-04 ENCOUNTER — Telehealth: Payer: Medicare Other | Admitting: Internal Medicine

## 2021-09-19 ENCOUNTER — Encounter: Payer: Self-pay | Admitting: Internal Medicine

## 2021-09-21 NOTE — Telephone Encounter (Signed)
Patient called regarding MyChart message below.  Patient was advised that Dr. Nicki Reaper is not in the office today.  Appointment made for tomorrow at 4pm with Einar Pheasant, MD. ?

## 2021-09-22 ENCOUNTER — Encounter: Payer: Self-pay | Admitting: Internal Medicine

## 2021-09-22 ENCOUNTER — Ambulatory Visit (INDEPENDENT_AMBULATORY_CARE_PROVIDER_SITE_OTHER): Payer: Medicare Other | Admitting: Internal Medicine

## 2021-09-22 VITALS — BP 132/80 | HR 86 | Temp 98.2°F | Resp 14 | Ht 70.0 in | Wt 169.0 lb

## 2021-09-22 DIAGNOSIS — D649 Anemia, unspecified: Secondary | ICD-10-CM

## 2021-09-22 DIAGNOSIS — R109 Unspecified abdominal pain: Secondary | ICD-10-CM

## 2021-09-22 DIAGNOSIS — R112 Nausea with vomiting, unspecified: Secondary | ICD-10-CM | POA: Diagnosis not present

## 2021-09-22 DIAGNOSIS — R110 Nausea: Secondary | ICD-10-CM | POA: Diagnosis not present

## 2021-09-22 DIAGNOSIS — I700 Atherosclerosis of aorta: Secondary | ICD-10-CM

## 2021-09-22 MED ORDER — ONDANSETRON HCL 4 MG PO TABS: 4.0000 mg | ORAL_TABLET | Freq: Three times a day (TID) | ORAL | 0 refills | Status: DC | PRN

## 2021-09-22 NOTE — Progress Notes (Signed)
Patient ID: Mike Farley, male   DOB: Feb 05, 1951, 71 y.o.   MRN: [id number] ? ? ?Subjective:  ? ? Patient ID: Mike Farley, male    DOB: March 20, 1951, 71 y.o.   MRN: [id number] ? ? ?Patient here for work in appt.  ? ?Chief Complaint  ?Patient presents with  ? Follow-up  ?  Follow up for nausea, vomiting, diarrhea for 9 days.  ? .  ? ?HPI ?States that for the last 1 1/2 weeks - nausea.  Has been taking pepto bismol and dramamine.  Has had a couple of episodes of emesis.  Trying to eat.  Thoughts of food - increase nausea.  Some constipation - few days ago.  Stool more soft/loose now.  Discussed benefiber.  No fever.  Breathing stable.  Minimal abdominal discomfort.  No blood in stool.   ? ? ?Past Medical History:  ?Diagnosis Date  ? Anxiety   ? 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 Cancer [facility])   ? prostate ca - Md just watching  ? Cervical pain (neck)   ? limited mobility  ? Chronic pain   ? Depression   ? years ago (minor)  ? HLD (hyperlipidemia)   ? Hx of cold sores   ? Mild cognitive impairment with memory loss   ? ?Past Surgical History:  ?Procedure Laterality Date  ? ABDOMINAL EXPOSURE N/A 04/23/2021  ? Procedure: ABDOMINAL EXPOSURE;  Surgeon: [name], MD;  Location: [place];  Service: Vascular;  Laterality: N/A;  ? ANTERIOR FUSION CERVICAL SPINE  2011  ? 4-5 levels  ? ANTERIOR LUMBAR FUSION N/A 04/23/2021  ? Procedure: Lumbar four-five,  Lumbar five-Sacral one Anterior Lumbar Interbody Fusion;  Surgeon: Dawley, Theodoro Doing, DO;  Location: [place];  Service: Neurosurgery;  Laterality: N/A;  ? APPENDECTOMY    ? BACK SURGERY    ? BONE EXCISION Left 09/28/2017  ? Procedure: BONE EXCISION-TAILORS  EXOSTECTOMY;  Surgeon: Samara Deist, DPM;  Location: [place];  Service: Podiatry;  Laterality: Left;  IVA LOCAL  ? CERVICAL DISCECTOMY  2008  ? C2-C7 Fused   ? CHOLECYSTECTOMY    ? COLONOSCOPY  2020  ? ELBOW FRACTURE SURGERY Left 2004  ? JOINT REPLACEMENT    ? right knee  ? LUMBAR PERCUTANEOUS PEDICLE SCREW 2  LEVEL N/A 04/23/2021  ? Procedure: LUMBAR PERCUTANEOUS PEDICLE SCREW LUMBAR FOUR-SACRAL ONE;  Surgeon: Karsten Ro, DO;  Location: [place];  Service: Neurosurgery;  Laterality: N/A;  ? METATARSAL HEAD EXCISION Left 07/04/2020  ? Procedure: METATARSAL HEAD EXCISION;  Surgeon: Samara Deist, DPM;  Location: [facility];  Service: Podiatry;  Laterality: Left;  ? PROSTATE BIOPSY N/A 03/15/2019  ? Procedure: PROSTATE BIOPSY [facility];  Surgeon: Royston Cowper, MD;  Location: [facility];  Service: Urology;  Laterality: N/A;  ? RADIOLOGY WITH ANESTHESIA N/A 04/22/2020  ? Procedure: MRI WITH ANESTHESIA BRAIN WITHOUT CONTRAST;  Surgeon: Radiologist, Medication, MD;  Location: [place];  Service: Radiology;  Laterality: N/A;  ? SHOULDER SURGERY Right   ? TONSILLECTOMY AND ADENOIDECTOMY    ? TOTAL KNEE ARTHROPLASTY Right 2005  ? x 4  ? ?Family History  ?Problem Relation Age of Onset  ? Arthritis Mother   ? Cancer Mother   ?     colon & breast cnacer  ? Mental illness Mother   ?     Depression & bipolar  ? Cancer Father   ?     lung & prostate cancer  ? Alcohol  abuse Brother   ? Kidney disease Brother   ? ?Social History  ? ?Socioeconomic History  ? Marital status: Married  ?  Spouse name: Not on file  ? Number of children: Not on file  ? Years of education: Not on file  ? Highest education level: Not on file  ?Occupational History  ? Not on file  ?Tobacco Use  ? Smoking status: Former  ? Smokeless tobacco: Current  ?  Types: Chew  ? Tobacco comments:  ?  Quit yrs ago  ?Vaping Use  ? Vaping Use: Never used  ?Substance and Sexual Activity  ? Alcohol use: Yes  ?  Alcohol/week: 7.0 standard drinks  ?  Types: 7 Standard drinks or equivalent per week  ?  Comment: beer/wine  ? Drug use: Never  ? Sexual activity: Not Currently  ?Other Topics Concern  ? Not on file  ?Social History Narrative  ? Not on file  ? ?Social Determinants of Health  ? ?Financial Resource Strain: Low Risk   ? Difficulty of Paying Living Expenses: Not hard at all  ?Food  Insecurity: No Food Insecurity  ? Worried About Charity fundraiser in the Last Year: Never true  ? Ran Out of Food in the Last Year: Never true  ?Transportation Needs: No Transportation Needs  ? Lack of Transportation (Medical): No  ? Lack of Transportation (Non-Medical): No  ?Physical Activity: Not on file  ?Stress: No Stress Concern Present  ? Feeling of Stress : Not at all  ?Social Connections: Unknown  ? Frequency of Communication with Friends and Family: Not on file  ? Frequency of Social Gatherings with Friends and Family: Not on file  ? Attends Religious Services: Not on file  ? Active Member of Clubs or Organizations: Not on file  ? Attends Archivist Meetings: Not on file  ? Marital Status: Married  ? ? ? ?Review of Systems  ?Constitutional:  Positive for appetite change. Negative for fever.  ?HENT:  Negative for sinus pressure.   ?Respiratory:  Negative for cough, chest tightness and shortness of breath.   ?Cardiovascular:  Negative for chest pain, palpitations and leg swelling.  ?Gastrointestinal:  Positive for constipation, nausea and vomiting.  ?     Some abdominal discomfort.  No severe pain.   ?Genitourinary:  Negative for difficulty urinating and dysuria.  ?Musculoskeletal:  Negative for joint swelling and myalgias.  ?Skin:  Negative for color change and rash.  ?Neurological:  Negative for dizziness, light-headedness and headaches.  ?Psychiatric/Behavioral:  Negative for agitation and dysphoric mood.   ? ?   ?Objective:  ?  ? ?BP 132/80 (BP Location: Left Arm, Patient Position: Sitting, Cuff Size: Small)   Pulse 86   Temp 98.2 ?F (36.8 ?C) (Temporal)   Resp 14   Ht '5\' 10"'$  (1.778 m)   Wt 169 lb (76.7 kg)   SpO2 99%   BMI 24.25 kg/m?  ?Wt Readings from Last 3 Encounters:  ?09/22/21 169 lb (76.7 kg)  ?08/07/21 167 lb (75.8 kg)  ?04/23/21 167 lb (75.8 kg)  ? ? ?Physical Exam ?Constitutional:   ?   General: He is not in acute distress. ?   Appearance: Normal appearance. He is  well-developed.  ?HENT:  ?   Head: Normocephalic and atraumatic.  ?   Right Ear: External ear normal.  ?   Left Ear: External ear normal.  ?Eyes:  ?   General: No scleral icterus.    ?   Right  eye: No discharge.     ?   Left eye: No discharge.  ?Cardiovascular:  ?   Rate and Rhythm: Normal rate and regular rhythm.  ?Pulmonary:  ?   Effort: Pulmonary effort is normal. No respiratory distress.  ?   Breath sounds: Normal breath sounds.  ?Abdominal:  ?   General: Bowel sounds are normal.  ?   Palpations: Abdomen is soft.  ?   Tenderness: There is no abdominal tenderness.  ?Musculoskeletal:     ?   General: No swelling or tenderness.  ?   Cervical back: Neck supple. No tenderness.  ?Lymphadenopathy:  ?   Cervical: No cervical adenopathy.  ?Skin: ?   Findings: No erythema or rash.  ?Neurological:  ?   Mental Status: He is alert.  ?Psychiatric:     ?   Mood and Affect: Mood normal.     ?   Behavior: Behavior normal.  ? ? ? ?Outpatient Encounter Medications as of 09/22/2021  ?Medication Sig  ? cholecalciferol (VITAMIN D3) 25 MCG (1000 UT) tablet Take 1,000 Units by mouth 3 (three) times a week.   ? clindamycin (CLEOCIN) 300 MG capsule Take 600 mg by mouth See admin instructions. Take 600 mg by mouth prior to dental procedures  ? cyanocobalamin 1000 MCG tablet Take 1,000 mcg by mouth daily.   ? donepezil (ARICEPT) 5 MG tablet Take 5 mg by mouth daily.  ? Melatonin 10 MG TABS Take 20 mg by mouth at bedtime.  ? methocarbamol (ROBAXIN) 500 MG tablet Take 500 mg by mouth 3 (three) times daily.  ? Multiple Vitamin (MULTIVITAMIN) tablet Take 1 tablet by mouth daily. ABC Complete MEN  ? oxyCODONE (ROXICODONE) 15 MG immediate release tablet Take 15 mg by mouth every 4 (four) hours as needed for pain.  ? oxymetazoline (AFRIN) 0.05 % nasal spray Place 1 spray into both nostrils at bedtime.  ? pantoprazole (PROTONIX) 40 MG tablet Take 1 tablet (40 mg total) by mouth daily.  ? predniSONE (DELTASONE) 10 MG tablet 6 tablets on Day 1 , then  reduce by 1 tablet daily until gone  ? QUEtiapine (SEROQUEL) 300 MG tablet TAKE ONE TABLET AT BEDTIME.  ? rosuvastatin (CRESTOR) 10 MG tablet TAKE ONE TABLET (10 MG) BY MOUTH EVERY DAY  ? tadalafil (CIALIS) 5 MG tablet Ta

## 2021-09-22 NOTE — Patient Instructions (Signed)
Take protonix 30 minutes before breakfast.  ? ?I am going to send in zofran.  This is for nausea.   ? ?Examples of probiotics are culturelle, florastor and align.  (Once per day) ?

## 2021-09-23 LAB — BASIC METABOLIC PANEL
BUN: 10 mg/dL (ref 6–23)
CO2: 26 mEq/L (ref 19–32)
Calcium: 10 mg/dL (ref 8.4–10.5)
Chloride: 100 mEq/L (ref 96–112)
Creatinine, Ser: 1 mg/dL (ref 0.40–1.50)
GFR: 75.83 mL/min (ref >60.00)
Glucose, Bld: 94 mg/dL (ref 70–99)
Potassium: 3.9 mEq/L (ref 3.5–5.1)
Sodium: 138 mEq/L (ref 135–145)

## 2021-09-23 LAB — CBC WITH DIFFERENTIAL/PLATELET
Basophils Absolute: 0 10*3/uL (ref 0.0–0.1)
Basophils Relative: 0.2 % (ref 0.0–3.0)
Eosinophils Absolute: 0 10*3/uL (ref 0.0–0.7)
Eosinophils Relative: 0.4 % (ref 0.0–5.0)
HCT: 38.1 % — ABNORMAL LOW (ref 39.0–52.0)
Hemoglobin: 12.9 g/dL — ABNORMAL LOW (ref 13.0–17.0)
Lymphocytes Relative: 24.3 % (ref 12.0–46.0)
Lymphs Abs: 1.3 10*3/uL (ref 0.7–4.0)
MCHC: 33.9 g/dL (ref 30.0–36.0)
MCV: 88 fl (ref 78.0–100.0)
Monocytes Absolute: 0.4 10*3/uL (ref 0.1–1.0)
Monocytes Relative: 7.4 % (ref 3.0–12.0)
Neutro Abs: 3.7 10*3/uL (ref 1.4–7.7)
Neutrophils Relative %: 67.7 % (ref 43.0–77.0)
Platelets: 224 10*3/uL (ref 150.0–400.0)
RBC: 4.33 Mil/uL (ref 4.22–5.81)
RDW: 15 % (ref 11.5–15.5)
WBC: 5.5 10*3/uL (ref 4.0–10.5)

## 2021-09-23 LAB — HEPATIC FUNCTION PANEL
ALT: 13 U/L (ref 0–53)
AST: 19 U/L (ref 0–37)
Albumin: 4.6 g/dL (ref 3.5–5.2)
Alkaline Phosphatase: 88 U/L (ref 39–117)
Bilirubin, Direct: 0.1 mg/dL (ref 0.0–0.3)
Total Bilirubin: 0.4 mg/dL (ref 0.2–1.2)
Total Protein: 7.3 g/dL (ref 6.0–8.3)

## 2021-09-23 LAB — LIPASE: Lipase: 10 U/L — ABNORMAL LOW (ref 11.0–59.0)

## 2021-09-23 LAB — AMYLASE: Amylase: 54 U/L (ref 27–131)

## 2021-09-23 MED ORDER — PANTOPRAZOLE SODIUM 40 MG PO TBEC: 40.0000 mg | DELAYED_RELEASE_TABLET | Freq: Every day | ORAL | 3 refills | Status: DC

## 2021-09-24 ENCOUNTER — Encounter: Payer: Self-pay | Admitting: Internal Medicine

## 2021-09-24 ENCOUNTER — Telehealth: Payer: Self-pay | Admitting: Internal Medicine

## 2021-09-24 DIAGNOSIS — K5900 Constipation, unspecified: Secondary | ICD-10-CM

## 2021-09-24 DIAGNOSIS — R110 Nausea: Secondary | ICD-10-CM

## 2021-09-24 DIAGNOSIS — R1084 Generalized abdominal pain: Secondary | ICD-10-CM

## 2021-09-24 NOTE — Telephone Encounter (Signed)
I have placed the order for the CT scan.  He has zofran to help with nausea.  If any acute change or worsening symptoms, he is to be evaluated.  ?

## 2021-09-24 NOTE — Telephone Encounter (Signed)
I called and let patient know that CT was ordered & he should hear first if next week. Also advised if worsening sx he needed to be seen at ED ASAP. ?

## 2021-09-24 NOTE — Telephone Encounter (Signed)
See my chart message note and lab result note.  ?

## 2021-09-24 NOTE — Telephone Encounter (Signed)
Pt called wanting to talk to cma about lab work and the things that were flagged because pt is not feeling better ?

## 2021-09-24 NOTE — Telephone Encounter (Signed)
Please call pt.  See lab result note.  (Labs were ok. Hgb better.  Remainder of labs ok).  If constipated, need to know when last bowel movement.  If had bowel movement in the last couple of days - can try miralax daily - to keep bowels moving.  If has been a few days or more, then I would recommend either a suppository or an enema - to get the bowels stimulated/moving.  Once move, can add miralax daily or qod.  He just started on the protonix and has only had a couple of doses.  Can increase temporarily to bid.  Also, if agreeable, I can schedule him for a CT abd/pelvis.   ?

## 2021-09-25 ENCOUNTER — Encounter: Payer: Self-pay | Admitting: Internal Medicine

## 2021-09-25 ENCOUNTER — Ambulatory Visit
Admission: RE | Admit: 2021-09-25 | Discharge: 2021-09-25 | Disposition: A | Discharge status: Home / Self Care | Payer: Medicare Other | Source: Ambulatory Visit | Attending: Internal Medicine | Admitting: Internal Medicine

## 2021-09-25 DIAGNOSIS — K5900 Constipation, unspecified: Secondary | ICD-10-CM | POA: Insufficient documentation

## 2021-09-25 DIAGNOSIS — R110 Nausea: Secondary | ICD-10-CM

## 2021-09-25 DIAGNOSIS — R1110 Vomiting, unspecified: Secondary | ICD-10-CM | POA: Diagnosis not present

## 2021-09-25 DIAGNOSIS — R109 Unspecified abdominal pain: Secondary | ICD-10-CM | POA: Diagnosis not present

## 2021-09-25 DIAGNOSIS — I700 Atherosclerosis of aorta: Secondary | ICD-10-CM | POA: Diagnosis not present

## 2021-09-25 MED ORDER — IOHEXOL 300 MG/ML  SOLN
100.0000 mL | Freq: Once | INTRAMUSCULAR | Status: AC | PRN
  Administered 2021-09-25: 100 mL via INTRAVENOUS

## 2021-09-25 NOTE — Telephone Encounter (Signed)
Please call Mr Detloff and let him know that his lipase level is low.  This is considered normal.  With pancreatitis, the lipase levels would be elevated.  Continue zofran and prilosec.  I have talked to GI and they are going to work him in next week.  They can see him Wednesday 09/30/21 at 8:45 - Dr Haig Prophet [facility].  Please let him know that I am not in the office this pm.  If persistent black stool or worsening symptoms, he will need to be seen.  ?

## 2021-09-25 NOTE — Telephone Encounter (Signed)
See other my chart message from today for response and GI appt, etc.  ?

## 2021-09-25 NOTE — Telephone Encounter (Signed)
Patient voiced understanding to PCP advice and will be seen if worsening symptoms over the weekend aware of GI appt and having CT this afternoon. ?

## 2021-09-26 ENCOUNTER — Encounter: Payer: Self-pay | Admitting: Internal Medicine

## 2021-09-26 DIAGNOSIS — N281 Cyst of kidney, acquired: Secondary | ICD-10-CM | POA: Insufficient documentation

## 2021-09-27 ENCOUNTER — Encounter: Payer: Self-pay | Admitting: Internal Medicine

## 2021-09-28 MED ORDER — ONDANSETRON HCL 4 MG PO TABS: 4.0000 mg | ORAL_TABLET | Freq: Three times a day (TID) | ORAL | 0 refills | Status: DC | PRN

## 2021-09-29 ENCOUNTER — Encounter: Payer: Self-pay | Admitting: Internal Medicine

## 2021-09-29 DIAGNOSIS — R110 Nausea: Secondary | ICD-10-CM | POA: Insufficient documentation

## 2021-09-29 NOTE — Assessment & Plan Note (Signed)
Check cbc today 

## 2021-09-29 NOTE — Assessment & Plan Note (Signed)
Continue crestor 

## 2021-09-29 NOTE — Assessment & Plan Note (Addendum)
Persistent increased nausea.  Previous vomiting.  Stools as outlined.  In reviewing, he previously had persistent nausea and GI issues.  Previous EGD - gastritis/esophagitis.  Discussed continuing with bland foods.  Stay hydrated.  Start PPI.  Check labs, including cbc, liver panel and pancreas tests.  Consider CT abdomen/pelvis.  Pending results, discussed possible need for f/u with GI - question of need for repeat EGD.  rx for zofran.  Discussed probiotics to help with loose stool.  ?

## 2021-09-29 NOTE — Assessment & Plan Note (Signed)
Minimal abdominal discomfort.  No severe pain.  Persistent nausea.  Previous EGD 06/06/18 - pathology:  Gastric mucosa shows reactive foveolar hyperplasia, stromal fibrosis and focal chronic inflammation.  No H. Pylori.  Chronic inflammation, minimal reactive epithelial changes.  Start PPI as directed.  Check labs as outlined.  Bland foods.  Follow.  Discussed CT abd/pelvis.  May need f/u with GI - f/u EGD.  ?

## 2021-09-30 DIAGNOSIS — R110 Nausea: Secondary | ICD-10-CM | POA: Diagnosis not present

## 2021-10-05 ENCOUNTER — Encounter: Payer: Self-pay | Admitting: *Deleted

## 2021-10-05 DIAGNOSIS — G894 Chronic pain syndrome: Secondary | ICD-10-CM | POA: Diagnosis not present

## 2021-10-05 DIAGNOSIS — M4802 Spinal stenosis, cervical region: Secondary | ICD-10-CM | POA: Diagnosis not present

## 2021-10-05 DIAGNOSIS — M47812 Spondylosis without myelopathy or radiculopathy, cervical region: Secondary | ICD-10-CM | POA: Diagnosis not present

## 2021-10-05 DIAGNOSIS — Z79891 Long term (current) use of opiate analgesic: Secondary | ICD-10-CM | POA: Diagnosis not present

## 2021-10-06 ENCOUNTER — Ambulatory Visit
Admission: RE | Admit: 2021-10-06 | Discharge: 2021-10-06 | Disposition: A | Discharge status: Home / Self Care | Payer: Medicare Other | Attending: Gastroenterology | Admitting: Gastroenterology

## 2021-10-06 ENCOUNTER — Encounter
Admission: RE | Disposition: A | Discharge status: Home / Self Care | Payer: Self-pay | Source: Home / Self Care | Attending: Gastroenterology

## 2021-10-06 ENCOUNTER — Encounter: Payer: Self-pay | Admitting: *Deleted

## 2021-10-06 ENCOUNTER — Ambulatory Visit: Payer: Medicare Other | Admitting: Anesthesiology

## 2021-10-06 DIAGNOSIS — G709 Myoneural disorder, unspecified: Secondary | ICD-10-CM | POA: Diagnosis not present

## 2021-10-06 DIAGNOSIS — K449 Diaphragmatic hernia without obstruction or gangrene: Secondary | ICD-10-CM | POA: Diagnosis not present

## 2021-10-06 DIAGNOSIS — K2289 Other specified disease of esophagus: Secondary | ICD-10-CM | POA: Insufficient documentation

## 2021-10-06 DIAGNOSIS — K2970 Gastritis, unspecified, without bleeding: Secondary | ICD-10-CM | POA: Insufficient documentation

## 2021-10-06 DIAGNOSIS — K5710 Diverticulosis of small intestine without perforation or abscess without bleeding: Secondary | ICD-10-CM | POA: Insufficient documentation

## 2021-10-06 DIAGNOSIS — D649 Anemia, unspecified: Secondary | ICD-10-CM | POA: Diagnosis not present

## 2021-10-06 DIAGNOSIS — Z79891 Long term (current) use of opiate analgesic: Secondary | ICD-10-CM | POA: Diagnosis not present

## 2021-10-06 DIAGNOSIS — G8929 Other chronic pain: Secondary | ICD-10-CM | POA: Insufficient documentation

## 2021-10-06 DIAGNOSIS — R110 Nausea: Secondary | ICD-10-CM | POA: Diagnosis not present

## 2021-10-06 DIAGNOSIS — K3189 Other diseases of stomach and duodenum: Secondary | ICD-10-CM | POA: Diagnosis not present

## 2021-10-06 DIAGNOSIS — K2100 Gastro-esophageal reflux disease with esophagitis, without bleeding: Secondary | ICD-10-CM | POA: Diagnosis not present

## 2021-10-06 DIAGNOSIS — K2270 Barrett's esophagus without dysplasia: Secondary | ICD-10-CM | POA: Diagnosis not present

## 2021-10-06 DIAGNOSIS — D759 Disease of blood and blood-forming organs, unspecified: Secondary | ICD-10-CM | POA: Diagnosis not present

## 2021-10-06 HISTORY — DX: Polyneuropathy, unspecified: G62.9

## 2021-10-06 HISTORY — DX: Personal history of other infectious and parasitic diseases: Z86.19

## 2021-10-06 HISTORY — DX: Other symptoms and signs involving the musculoskeletal system: R29.898

## 2021-10-06 HISTORY — PX: ESOPHAGOGASTRODUODENOSCOPY (EGD) WITH PROPOFOL: SHX5813

## 2021-10-06 HISTORY — DX: Elevated prostate specific antigen (PSA): R97.20

## 2021-10-06 SURGERY — ESOPHAGOGASTRODUODENOSCOPY (EGD) WITH PROPOFOL
Anesthesia: General

## 2021-10-06 MED ORDER — LIDOCAINE HCL (PF) 2 % IJ SOLN
INTRAMUSCULAR | Status: AC
  Filled 2021-10-06: qty 10

## 2021-10-06 MED ORDER — PROPOFOL 10 MG/ML IV BOLUS
INTRAVENOUS | Status: DC | PRN
  Administered 2021-10-06: 70 mg via INTRAVENOUS
  Administered 2021-10-06 (×2): 30 mg via INTRAVENOUS

## 2021-10-06 MED ORDER — LIDOCAINE HCL (CARDIAC) PF 100 MG/5ML IV SOSY
PREFILLED_SYRINGE | INTRAVENOUS | Status: DC | PRN
  Administered 2021-10-06: 100 mg via INTRAVENOUS

## 2021-10-06 MED ORDER — PROPOFOL 10 MG/ML IV BOLUS
INTRAVENOUS | Status: AC
  Filled 2021-10-06: qty 100

## 2021-10-06 MED ORDER — PROPOFOL 500 MG/50ML IV EMUL
INTRAVENOUS | Status: AC
  Filled 2021-10-06: qty 100

## 2021-10-06 MED ORDER — SODIUM CHLORIDE 0.9 % IV SOLN: INTRAVENOUS | Status: DC

## 2021-10-06 NOTE — Anesthesia Postprocedure Evaluation (Signed)
Anesthesia Post Note ? ?Patient: Mike Farley ? ?Procedure(s) Performed: ESOPHAGOGASTRODUODENOSCOPY (EGD) WITH PROPOFOL ? ?Patient location during evaluation: PACU ?Anesthesia Type: General ?Level of consciousness: awake and alert ?Pain management: pain level controlled ?Vital Signs Assessment: post-procedure vital signs reviewed and stable ?Respiratory status: spontaneous breathing, nonlabored ventilation and respiratory function stable ?Cardiovascular status: blood pressure returned to baseline and stable ?Postop Assessment: no apparent nausea or vomiting ?Anesthetic complications: no ? ? ?No notable events documented. ? ? ?Last Vitals:  ?Vitals:  ? 10/06/21 0811 10/06/21 0821  ?BP: (!) 117/97 133/78  ?Pulse:    ?Resp:    ?Temp:    ?SpO2:    ?  ?Last Pain:  ?Vitals:  ? 10/06/21 0821  ?TempSrc:   ?PainSc: 0-No pain  ? ? ?  ?  ?  ?  ?  ?  ? ?[place] Ouch ? ? ? ? ?

## 2021-10-06 NOTE — Interval H&P Note (Signed)
History and Physical Interval Note: ? ?10/06/2021 ?7:43 AM ? ?Mike Farley  has presented today for surgery, with the diagnosis of Nausea.  The various methods of treatment have been discussed with the patient and family. After consideration of risks, benefits and other options for treatment, the patient has consented to  Procedure(s): ?ESOPHAGOGASTRODUODENOSCOPY (EGD) WITH PROPOFOL (N/A) as a surgical intervention.  The patient's history has been reviewed, patient examined, no change in status, stable for surgery.  I have reviewed the patient's chart and labs.  Questions were answered to the patient's satisfaction.   ? ? ?Mike Farley ? ?Ok to proceed with EGD ?

## 2021-10-06 NOTE — Anesthesia Preprocedure Evaluation (Addendum)
Anesthesia Evaluation  ?Patient identified by MRN, date of birth, ID band ?Patient awake ? ? ? ?Reviewed: ?Allergy & Precautions, NPO status , Patient's Chart, lab work & pertinent test results ? ?Airway ?Mallampati: III ? ?TM Distance: >3 FB ?Neck ROM: Full ? ? ? Dental ? ?(+) Dental Advisory Given, Missing,  ?  ?Pulmonary ?former smoker,  ?  ?breath sounds clear to auscultation ? ? ? ? ? ? Cardiovascular ?negative cardio ROS ? ? ?Rhythm:Regular Rate:Normal ? ? ?  ?Neuro/Psych ? Headaches, PSYCHIATRIC DISORDERS Anxiety Cervical pain s/p fusion ? Neuromuscular disease   ? GI/Hepatic ?negative GI ROS, Neg liver ROS,   ?Endo/Other  ?negative endocrine ROS ? Renal/GU ?negative Renal ROS  ? ?  ?Musculoskeletal ? ?(+) Arthritis ,  ? Abdominal ?Normal abdominal exam  (+)   ?Peds ? Hematology ? ?(+) Blood dyscrasia, anemia ,   ?Anesthesia Other Findings ?Prostate cancer ?Chronic opioid use ? Reproductive/Obstetrics ? ?  ? ? ? ? ? ? ? ? ? ? ? ? ? ?  ?  ? ? ? ? ? ? ?Lab Results  ?Component Value Date  ? WBC 5.5 09/22/2021  ? HGB 12.9 (L) 09/22/2021  ? HCT 38.1 (L) 09/22/2021  ? MCV 88.0 09/22/2021  ? PLT 224.0 09/22/2021  ? ?Lab Results  ?Component Value Date  ? CREATININE 1.00 09/23/2021  ? BUN 10 09/23/2021  ? NA 138 09/23/2021  ? K 3.9 09/23/2021  ? CL 100 09/23/2021  ? CO2 26 09/23/2021  ?  ?Anesthesia Physical ? ?Anesthesia Plan ? ?ASA: 3 ? ?Anesthesia Plan: General  ? ?Post-op Pain Management: Minimal or no pain anticipated  ? ?Induction: Intravenous ? ?PONV Risk Score and Plan: 2 and Propofol infusion and Treatment may vary due to age or medical condition ? ?Airway Management Planned: Natural Airway ? ?Additional Equipment:  ? ?Intra-op Plan:  ? ?Post-operative Plan:  ? ?Informed Consent: I have reviewed the patients History and Physical, chart, labs and discussed the procedure including the risks, benefits and alternatives for the proposed anesthesia with the patient or authorized  representative who has indicated his/her understanding and acceptance.  ? ? ? ?Dental advisory given ? ?Plan Discussed with: CRNA ? ?Anesthesia Plan Comments: ( ?)  ? ? ? ? ? ?Anesthesia Quick Evaluation ? ?

## 2021-10-06 NOTE — Transfer of Care (Signed)
Immediate Anesthesia Transfer of Care Note ? ?Patient: MEKO MASTERSON ? ?Procedure(s) Performed: ESOPHAGOGASTRODUODENOSCOPY (EGD) WITH PROPOFOL ? ?Patient Location: PACU and Endoscopy Unit ? ?Anesthesia Type:General ? ?Level of Consciousness: awake, drowsy and patient cooperative ? ?Airway & Oxygen Therapy: Patient Spontanous Breathing and Patient connected to nasal cannula oxygen ? ?Post-op Assessment: Report given to RN and Post -op Vital signs reviewed and stable ? ?Post vital signs: Reviewed and stable ? ?Last Vitals:  ?Vitals Value Taken Time  ?BP 107/67 10/06/21 0801  ?Temp    ?Pulse 60 10/06/21 0802  ?Resp 21 10/06/21 0802  ?SpO2 97 % 10/06/21 0802  ?Vitals shown include unvalidated device data. ? ?Last Pain:  ?Vitals:  ? 10/06/21 0657  ?TempSrc: Temporal  ?   ? ?  ? ?Complications: No notable events documented. ?

## 2021-10-06 NOTE — Op Note (Signed)
[facility] ?Gastroenterology ?Patient Name: Mike Farley ?Procedure Date: 10/06/2021 7:25 AM ?MRN: [id number] ?Account #: [id number] ?Date of Birth: [birth year] ?Admit Type: Outpatient ?Age: 71 ?Room: [facility] ENDO ROOM 1 ?Gender: Male ?Note Status: Finalized ?Instrument Name: Upper Endoscope [id number] ?Procedure:             Upper GI endoscopy ?Indications:           Nausea ?Providers:             Andrey Farmer MD, MD ?Medicines:             Monitored Anesthesia Care ?Complications:         No immediate complications. Estimated blood loss:  ?                       Minimal. ?Procedure:             Pre-Anesthesia Assessment: ?                       - Prior to the procedure, a History and Physical was  ?                       performed, and patient medications and allergies were  ?                       reviewed. The patient is competent. The risks and  ?                       benefits of the procedure and the sedation options and  ?                       risks were discussed with the patient. All questions  ?                       were answered and informed consent was obtained.  ?                       Patient identification and proposed procedure were  ?                       verified by the physician, the nurse, the  ?                       anesthesiologist, the anesthetist and the technician  ?                       in the endoscopy suite. Mental Status Examination:  ?                       alert and oriented. Airway Examination: normal  ?                       oropharyngeal airway and neck mobility. Respiratory  ?                       Examination: clear to auscultation. CV Examination:  ?                       normal. Prophylactic Antibiotics: The patient does not  ?  require prophylactic antibiotics. Prior  ?                       Anticoagulants: The patient has taken no previous  ?                       anticoagulant or antiplatelet agents. ASA Grade  ?                        Assessment: II - A patient with mild systemic disease.  ?                       After reviewing the risks and benefits, the patient  ?                       was deemed in satisfactory condition to undergo the  ?                       procedure. The anesthesia plan was to use monitored  ?                       anesthesia care (MAC). Immediately prior to  ?                       administration of medications, the patient was  ?                       re-assessed for adequacy to receive sedatives. The  ?                       heart rate, respiratory rate, oxygen saturations,  ?                       blood pressure, adequacy of pulmonary ventilation, and  ?                       response to care were monitored throughout the  ?                       procedure. The physical status of the patient was  ?                       re-assessed after the procedure. ?                       After obtaining informed consent, the endoscope was  ?                       passed under direct vision. Throughout the procedure,  ?                       the patient's blood pressure, pulse, and oxygen  ?                       saturations were monitored continuously. The  ?                       Endosonoscope was introduced through the mouth, and  ?  advanced to the second part of duodenum. The upper GI  ?                       endoscopy was accomplished without difficulty. The  ?                       patient tolerated the procedure well. ?Findings: ?     One tongue of salmon-colored mucosa was present. No other visible  ?     abnormalities were present. The maximum longitudinal extent of these  ?     esophageal mucosal changes was 1 cm in length. Biopsies were taken with  ?     a cold forceps for histology. Estimated blood loss was minimal. ?     A small hiatal hernia was present. ?     Patchy mild inflammation characterized by erythema was found in the  ?     gastric antrum. Biopsies were taken with a cold forceps for  Helicobacter  ?     pylori testing. Estimated blood loss was minimal. ?     A non-bleeding diverticulum was found in the second portion of the  ?     duodenum. ?     The exam of the duodenum was otherwise normal. ?Impression:            - Salmon-colored mucosa suspicious for short-segment  ?                       Barrett's esophagus. Biopsied. ?                       - Small hiatal hernia. ?                       - Gastritis. Biopsied. ?                       - Non-bleeding duodenal diverticulum. ?Recommendation:        - Discharge patient to home. ?                       - Resume previous diet. ?                       - Continue present medications. ?                       - Await pathology results. ?                       - Return to referring physician as previously  ?                       scheduled. ?Procedure Code(s):     --- Professional --- ?                       [phone], Esophagogastroduodenoscopy, flexible,  ?                       transoral; with biopsy, single or multiple ?Diagnosis Code(s):     --- Professional --- ?                       K22.8, Other  specified diseases of esophagus ?                       K44.9, Diaphragmatic hernia without obstruction or  ?                       gangrene ?                       K29.70, Gastritis, unspecified, without bleeding ?                       R11.0, Nausea ?                       K57.10, Diverticulosis of small intestine without  ?                       perforation or abscess without bleeding ?CPT copyright 2019 [employer]. All rights reserved. ?The codes documented in this report are preliminary and upon coder review may  ?be revised to meet current compliance requirements. ?Andrey Farmer MD, MD ?10/06/2021 8:01:13 AM ?Number of Addenda: 0 ?Note Initiated On: 10/06/2021 7:25 AM ?Estimated Blood Loss:  Estimated blood loss was minimal. ?     [facility] ?

## 2021-10-06 NOTE — H&P (Signed)
Outpatient short stay form Pre-procedure ?10/06/2021  ?Lesly Rubenstein, MD ? ?Primary Physician: Einar Pheasant, MD ? ?Reason for visit:  Nausea ? ?History of present illness:   ? ?71 y/o gentleman with chronic back pain on opioids here for EGD for chronic nausea. No blood thinners. No family history of GI issues. Multiple neck and back surgeries. ? ? ? ?Current Facility-Administered Medications:  ?  0.9 %  sodium chloride infusion, , Intravenous, Continuous, Kenaz Olafson, Hilton Cork, MD ? ?Medications Prior to Admission  ?Medication Sig Dispense Refill Last Dose  ? cholecalciferol (VITAMIN D3) 25 MCG (1000 UT) tablet Take 1,000 Units by mouth 3 (three) times a week.    10/05/2021  ? ciprofloxacin (CIPRO) 500 MG tablet Take 500 mg by mouth.     ? donepezil (ARICEPT) 5 MG tablet Take 5 mg by mouth daily.   Past Week  ? methocarbamol (ROBAXIN) 500 MG tablet Take 500 mg by mouth 3 (three) times daily.   Past Month  ? Multiple Vitamin (MULTIVITAMIN) tablet Take 1 tablet by mouth daily. ABC Complete MEN   Past Week  ? ondansetron (ZOFRAN) 4 MG tablet Take 1 tablet (4 mg total) by mouth every 8 (eight) hours as needed for nausea or vomiting. 21 tablet 0 10/06/2021  ? oxyCODONE (ROXICODONE) 15 MG immediate release tablet Take 15 mg by mouth every 4 (four) hours as needed for pain.   10/06/2021  ? pantoprazole (PROTONIX) 40 MG tablet Take 1 tablet (40 mg total) by mouth daily. 30 tablet 3 10/05/2021  ? QUEtiapine (SEROQUEL) 300 MG tablet TAKE ONE TABLET AT BEDTIME. 90 tablet 1 10/05/2021  ? rosuvastatin (CRESTOR) 10 MG tablet TAKE ONE TABLET (10 MG) BY MOUTH EVERY DAY 30 tablet 5 10/05/2021  ? tamsulosin (FLOMAX) 0.4 MG CAPS capsule Take 0.4 mg by mouth daily.   10/05/2021  ? valACYclovir (VALTREX) 500 MG tablet Take 500 mg by mouth 2 (two) times daily.     ? clindamycin (CLEOCIN) 300 MG capsule Take 600 mg by mouth See admin instructions. Take 600 mg by mouth prior to dental procedures     ? cyanocobalamin 1000 MCG tablet Take  1,000 mcg by mouth daily.      ? Melatonin 10 MG TABS Take 20 mg by mouth at bedtime.     ? oxymetazoline (AFRIN) 0.05 % nasal spray Place 1 spray into both nostrils at bedtime.     ? predniSONE (DELTASONE) 10 MG tablet 6 tablets on Day 1 , then reduce by 1 tablet daily until gone 21 tablet 0   ? tadalafil (CIALIS) 5 MG tablet Take 5 mg by mouth daily.     ? ? ? ?Allergies  ?Allergen Reactions  ? Morphine Itching and Rash  ? Amoxicillin   ?  REACTION: abdominal distention and churning pain in abdomen. ?Did it involve swelling of the face/tongue/throat, SOB, or low BP? No ?Did it involve sudden or severe rash/hives, skin peeling, or any reaction on the inside of your mouth or nose? No ?Did you need to seek medical attention at a hospital or doctor's office? No ?When did it last happen?      40+ years ago ?If all above answers are ?NO?, may proceed with cephalosporin use. ?  ? ? ? ?Past Medical History:  ?Diagnosis Date  ? Anxiety   ? 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 Cancer [facility])   ? prostate ca - Md just watching  ? Cervical pain (neck)   ? limited mobility  ?  Chronic pain   ? Decreased ROM of neck   ? Depression   ? years ago (minor)  ? Elevated PSA   ? History of chicken pox   ? HLD (hyperlipidemia)   ? Hx of cold sores   ? Mild cognitive impairment with memory loss   ? Neuropathy   ? ? ?Review of systems:  Otherwise negative.  ? ? ?Physical Exam ? ?Gen: Alert, oriented. Appears stated age.  ?HEENT:PERRLA. ?Lungs: No respiratory distress ?CV: RRR ?Abd: soft, benign, no masses ?Ext: No edema ? ? ? ?Planned procedures: Proceed with EGD. The patient understands the nature of the planned procedure, indications, risks, alternatives and potential complications including but not limited to bleeding, infection, perforation, damage to internal organs and possible oversedation/side effects from anesthesia. The patient agrees and gives consent to proceed.  ?Please refer to procedure notes for findings, recommendations and patient  disposition/instructions.  ? ? ? ?Lesly Rubenstein, MD ?[facility] ? ? ? ?  ?

## 2021-10-07 ENCOUNTER — Encounter: Payer: Self-pay | Admitting: Gastroenterology

## 2021-10-07 LAB — SURGICAL PATHOLOGY

## 2021-10-12 ENCOUNTER — Other Ambulatory Visit: Payer: Self-pay | Admitting: Internal Medicine

## 2021-10-12 ENCOUNTER — Encounter: Payer: Self-pay | Admitting: Internal Medicine

## 2021-10-13 MED ORDER — ONDANSETRON HCL 4 MG PO TABS
4.0000 mg | ORAL_TABLET | Freq: Three times a day (TID) | ORAL | 0 refills | Status: DC | PRN
Start: 2021-10-13 — End: 2021-10-28

## 2021-10-13 NOTE — Telephone Encounter (Signed)
Pt called in requesting refill on medication (ondansetron (ZOFRAN) 4 MG tablet)... Pt stated that he needs medication refill today because he will be leaving tomorrow to go out of town... Pt requesting callback

## 2021-10-13 NOTE — Telephone Encounter (Signed)
Zofran refilled. Please call and notify pt.  Also, he has seen GI.  Has he notified them of his persistent nausea.

## 2021-10-26 ENCOUNTER — Encounter: Payer: Self-pay | Admitting: Internal Medicine

## 2021-10-27 ENCOUNTER — Encounter: Payer: Self-pay | Admitting: Internal Medicine

## 2021-10-27 ENCOUNTER — Other Ambulatory Visit: Payer: Self-pay | Admitting: Internal Medicine

## 2021-10-27 DIAGNOSIS — R110 Nausea: Secondary | ICD-10-CM

## 2021-10-27 NOTE — Telephone Encounter (Signed)
Order placed for GI referral to Dr Zenovia Jarred

## 2021-10-28 ENCOUNTER — Encounter: Payer: Self-pay | Admitting: Internal Medicine

## 2021-10-30 ENCOUNTER — Ambulatory Visit: Payer: Medicare Other | Admitting: Internal Medicine

## 2021-11-03 ENCOUNTER — Encounter: Payer: Self-pay | Admitting: Internal Medicine

## 2021-11-03 ENCOUNTER — Telehealth: Payer: Self-pay | Admitting: Internal Medicine

## 2021-11-03 ENCOUNTER — Ambulatory Visit (INDEPENDENT_AMBULATORY_CARE_PROVIDER_SITE_OTHER): Payer: Medicare Other | Admitting: Internal Medicine

## 2021-11-03 DIAGNOSIS — R110 Nausea: Secondary | ICD-10-CM | POA: Diagnosis not present

## 2021-11-03 DIAGNOSIS — C61 Malignant neoplasm of prostate: Secondary | ICD-10-CM

## 2021-11-03 DIAGNOSIS — I700 Atherosclerosis of aorta: Secondary | ICD-10-CM

## 2021-11-03 DIAGNOSIS — G479 Sleep disorder, unspecified: Secondary | ICD-10-CM | POA: Diagnosis not present

## 2021-11-03 NOTE — Telephone Encounter (Signed)
Hi Dr. Hilarie Fredrickson,  We received a referral for this patient to be evaluated for nausea. Patient has GI history with Dr. Haig Prophet and he is requesting a transfer of care over to you said he is a family friend of yours.  Records are available via Care Everywhere for yo to review and advise on scheduling.  Thank you

## 2021-11-03 NOTE — Telephone Encounter (Signed)
Ok for appt  

## 2021-11-03 NOTE — Progress Notes (Signed)
Patient ID: Coy Saunas, male   DOB: [birth year], 71 y.o.   MRN: [id number]   Subjective:    Patient ID: Coy Saunas, male    DOB: 03-13-1951, 71 y.o.   MRN: [id number]   Patient here for work in appt.    HPI Work in to discuss changing sleeping medication.  Has had persistent nausea and GI symptoms for the last two months.  Has been seeing GI.  W/up has been unrevealing.  The concern was raised that his seroquel may be contributing.  Comes in today to discuss changing medication.  The persistent nausea has caused him to feel some depression - related to his medical issues.  No SI.  He is walking.  Has been trying to follow - BRAT diet.  Due to see Dr Hilarie Fredrickson 12/11/21.  States was placed on seroquel for sleep years ago.  Does feel needs something to help him sleep.     Past Medical History:  Diagnosis Date   Anxiety    Arthritis    shoulder   Cancer ([place])    prostate ca - Md just watching   Cervical pain (neck)    limited mobility   Chronic pain    Decreased ROM of neck    Depression    years ago (minor)   Elevated PSA    History of chicken pox    HLD (hyperlipidemia)    Hx of cold sores    Mild cognitive impairment with memory loss    Neuropathy    Past Surgical History:  Procedure Laterality Date   ABDOMINAL EXPOSURE N/A 04/23/2021   Procedure: ABDOMINAL EXPOSURE;  Surgeon: [name], MD;  Location: [place];  Service: Vascular;  Laterality: N/A;   ANTERIOR FUSION CERVICAL SPINE  2011   4-5 levels   ANTERIOR LUMBAR FUSION N/A 04/23/2021   Procedure: Lumbar four-five,  Lumbar five-Sacral one Anterior Lumbar Interbody Fusion;  Surgeon: Karsten Ro, DO;  Location: [place];  Service: Neurosurgery;  Laterality: N/A;   APPENDECTOMY     BACK SURGERY     BONE EXCISION Left 09/28/2017   Procedure: BONE EXCISION-TAILORS  EXOSTECTOMY;  Surgeon: Samara Deist, DPM;  Location: [place];  Service: Podiatry;  Laterality: Left;  IVA LOCAL   CERVICAL  DISCECTOMY  2008   C2-C7 Fused    CHOLECYSTECTOMY     COLONOSCOPY  2020   ELBOW FRACTURE SURGERY Left 2004   ESOPHAGOGASTRODUODENOSCOPY (EGD) WITH PROPOFOL N/A 10/06/2021   Procedure: ESOPHAGOGASTRODUODENOSCOPY (EGD) WITH PROPOFOL;  Surgeon: Lesly Rubenstein, MD;  Location: [facility];  Service: Endoscopy;  Laterality: N/A;   FRACTURE SURGERY     JOINT REPLACEMENT     right knee   LUMBAR PERCUTANEOUS PEDICLE SCREW 2 LEVEL N/A 04/23/2021   Procedure: LUMBAR PERCUTANEOUS PEDICLE SCREW LUMBAR FOUR-SACRAL ONE;  Surgeon: Karsten Ro, DO;  Location: [place];  Service: Neurosurgery;  Laterality: N/A;   METATARSAL HEAD EXCISION Left 07/04/2020   Procedure: METATARSAL HEAD EXCISION;  Surgeon: Samara Deist, DPM;  Location: [facility];  Service: Podiatry;  Laterality: Left;   PROSTATE BIOPSY N/A 03/15/2019   Procedure: PROSTATE BIOPSY Uro Nav;  Surgeon: Royston Cowper, MD;  Location: [facility];  Service: Urology;  Laterality: N/A;   RADIOLOGY WITH ANESTHESIA N/A 04/22/2020   Procedure: MRI WITH ANESTHESIA BRAIN WITHOUT CONTRAST;  Surgeon: Radiologist, Medication, MD;  Location: [place];  Service: Radiology;  Laterality: N/A;   SHOULDER SURGERY Right    TONSILLECTOMY AND ADENOIDECTOMY  TOTAL KNEE ARTHROPLASTY Right 2005   x 4   Family History  Problem Relation Age of Onset   Arthritis Mother    Cancer Mother        colon & breast cnacer   Mental illness Mother        Depression & bipolar   Cancer Father        lung & prostate cancer   Alcohol abuse Brother    Kidney disease Brother    Social History   Socioeconomic History   Marital status: Married    Spouse name: Not on file   Number of children: Not on file   Years of education: Not on file   Highest education level: Not on file  Occupational History   Not on file  Tobacco Use   Smoking status: Former   Smokeless tobacco: Current    Types: Chew   Tobacco comments:    Quit yrs ago  Vaping Use   Vaping Use: Never used   Substance and Sexual Activity   Alcohol use: Yes    Alcohol/week: 7.0 standard drinks of alcohol    Types: 7 Standard drinks or equivalent per week    Comment: beer/wine   Drug use: Never   Sexual activity: Not Currently  Other Topics Concern   Not on file  Social History Narrative   Not on file   Social Determinants of Health   Financial Resource Strain: Low Risk  (11/18/2020)   Overall Financial Resource Strain (CARDIA)    Difficulty of Paying Living Expenses: Not hard at all  Food Insecurity: No Food Insecurity (11/18/2020)   Hunger Vital Sign    Worried About Running Out of Food in the Last Year: Never true    Ran Out of Food in the Last Year: Never true  Transportation Needs: No Transportation Needs (11/18/2020)   PRAPARE - Hydrologist (Medical): No    Lack of Transportation (Non-Medical): No  Physical Activity: Not on file  Stress: No Stress Concern Present (11/18/2020)   [place]    Feeling of Stress : Not at all  Social Connections: Unknown (11/18/2020)   Social Connection and Isolation Panel [NHANES]    Frequency of Communication with Friends and Family: Not on file    Frequency of Social Gatherings with Friends and Family: Not on file    Attends Religious Services: Not on file    Active Member of Clubs or Organizations: Not on file    Attends Archivist Meetings: Not on file    Marital Status: Married     Review of Systems  Constitutional:  Negative for fever.       Decreased appetite as outlined.   HENT:  Negative for congestion and sinus pressure.   Respiratory:  Negative for cough, chest tightness and shortness of breath.   Cardiovascular:  Negative for chest pain, palpitations and leg swelling.  Gastrointestinal:  Positive for nausea. Negative for vomiting.       No increased abdominal pain.   Genitourinary:  Negative for difficulty urinating and  dysuria.  Musculoskeletal:  Negative for joint swelling and myalgias.  Skin:  Negative for color change and rash.  Neurological:  Negative for dizziness, light-headedness and headaches.  Psychiatric/Behavioral:  Negative for agitation.        Increased stress and frustration given persistent nausea.        Objective:     BP 128/80 (BP  Location: Left Arm, Patient Position: Sitting, Cuff Size: Small)   Pulse 67   Temp 98.8 F (37.1 C) (Temporal)   Resp 17   Ht '5\' 10"'$  (1.778 m)   Wt 166 lb (75.3 kg)   SpO2 98%   BMI 23.82 kg/m  Wt Readings from Last 3 Encounters:  11/03/21 166 lb (75.3 kg)  10/06/21 165 lb (74.8 kg)  09/22/21 169 lb (76.7 kg)    Physical Exam Constitutional:      General: He is not in acute distress.    Appearance: Normal appearance. He is well-developed.  HENT:     Head: Normocephalic and atraumatic.     Right Ear: External ear normal.     Left Ear: External ear normal.  Eyes:     General: No scleral icterus.       Right eye: No discharge.        Left eye: No discharge.  Cardiovascular:     Rate and Rhythm: Normal rate and regular rhythm.  Pulmonary:     Effort: Pulmonary effort is normal. No respiratory distress.     Breath sounds: Normal breath sounds.  Abdominal:     General: Bowel sounds are normal.     Palpations: Abdomen is soft.     Tenderness: There is no abdominal tenderness.  Musculoskeletal:        General: No swelling or tenderness.     Cervical back: Neck supple. No tenderness.  Lymphadenopathy:     Cervical: No cervical adenopathy.  Skin:    Findings: No erythema or rash.  Neurological:     Mental Status: He is alert.  Psychiatric:        Mood and Affect: Mood normal.        Behavior: Behavior normal.      Outpatient Encounter Medications as of 11/03/2021  Medication Sig   busPIRone (BUSPAR) 7.5 MG tablet Take 7.5 mg by mouth 3 (three) times daily.   cholecalciferol (VITAMIN D3) 25 MCG (1000 UT) tablet Take 1,000 Units by  mouth 3 (three) times a week.    ciprofloxacin (CIPRO) 500 MG tablet Take 500 mg by mouth.   clindamycin (CLEOCIN) 300 MG capsule Take 600 mg by mouth See admin instructions. Take 600 mg by mouth prior to dental procedures   cyanocobalamin 1000 MCG tablet Take 1,000 mcg by mouth daily.    donepezil (ARICEPT) 5 MG tablet Take 5 mg by mouth daily.   methocarbamol (ROBAXIN) 500 MG tablet Take 500 mg by mouth 3 (three) times daily.   Multiple Vitamin (MULTIVITAMIN) tablet Take 1 tablet by mouth daily. ABC Complete MEN   ondansetron (ZOFRAN) 4 MG tablet TAKE ONE TABLET BY MOUTH EVERY 8 HOURS AS NEEDED FOR NAUSEA OR VOMITING   oxyCODONE (ROXICODONE) 15 MG immediate release tablet Take 15 mg by mouth every 4 (four) hours as needed for pain.   oxymetazoline (AFRIN) 0.05 % nasal spray Place 1 spray into both nostrils at bedtime.   pantoprazole (PROTONIX) 40 MG tablet Take 1 tablet (40 mg total) by mouth daily.   promethazine (PHENERGAN) 12.5 MG tablet Take 12.5 mg by mouth every 6 (six) hours as needed for nausea or vomiting.   Prucalopride Succinate (MOTEGRITY) 2 MG TABS Take by mouth.   QUEtiapine (SEROQUEL) 300 MG tablet TAKE ONE TABLET AT BEDTIME.   rosuvastatin (CRESTOR) 10 MG tablet TAKE ONE TABLET (10 MG) BY MOUTH EVERY DAY   tadalafil (CIALIS) 5 MG tablet Take 5 mg by mouth daily.  tamsulosin (FLOMAX) 0.4 MG CAPS capsule Take 0.4 mg by mouth daily.   valACYclovir (VALTREX) 500 MG tablet Take 500 mg by mouth 2 (two) times daily.   No facility-administered encounter medications on file as of 11/03/2021.     Lab Results  Component Value Date   WBC 5.5 09/22/2021   HGB 12.9 (L) 09/22/2021   HCT 38.1 (L) 09/22/2021   PLT 224.0 09/22/2021   GLUCOSE 94 09/23/2021   CHOL 160 03/30/2021   TRIG 107.0 03/30/2021   HDL 74.60 03/30/2021   LDLCALC 64 03/30/2021   ALT 13 09/22/2021   AST 19 09/22/2021   NA 138 09/23/2021   K 3.9 09/23/2021   CL 100 09/23/2021   CREATININE 1.00 09/23/2021   BUN  10 09/23/2021   CO2 26 09/23/2021   TSH 1.96 03/30/2021   PSA 9.28 (H) 03/31/2021   INR 1.03 05/29/2009   HGBA1C 5.8 03/30/2021       Assessment & Plan:   Problem List Items Addressed This Visit     Aortic atherosclerosis ([place])    Continue crestor.        Nausea    Persistent increased nausea. Seeing Dr Haig Prophet.  (previously saw Dr Tiffany Kocher - retired).  Recent EGD Salmon-colored mucosa suspicious for short-segment Barrett's esophagus. Biopsied. Small hiatal hernia. Gastritis. Biopsied.  Non-bleeding duodenal diverticulum. On PPI.  Has zofran to take prn.  BRAT diet.  Persistent symptoms. Concern raised that seroquel may ben contributing.  Discussed medication.  States was placed on it for sleep years ago.  After discussion, will decrease seroquel to 1/2 tablet q hs.  Start trazodone as directed.  Will plan to titrate up trazodone and decrease and taper off seroquel.  Follow.  Had questions about gastric emptying study.  Questions answered.  Planning to f/u with [facility] in July.        Prostate cancer [facility])    Being followed by oncology.  Elected active surveillance.        Sleep difficulties    Plan to decrease seroquel and start trazodone as outlined.  Follow.         Einar Pheasant, MD

## 2021-11-04 ENCOUNTER — Encounter: Payer: Self-pay | Admitting: Internal Medicine

## 2021-11-04 ENCOUNTER — Other Ambulatory Visit: Payer: Self-pay | Admitting: Gastroenterology

## 2021-11-04 ENCOUNTER — Encounter: Payer: Self-pay | Admitting: Psychiatry

## 2021-11-04 DIAGNOSIS — M5416 Radiculopathy, lumbar region: Secondary | ICD-10-CM | POA: Diagnosis not present

## 2021-11-04 DIAGNOSIS — R112 Nausea with vomiting, unspecified: Secondary | ICD-10-CM

## 2021-11-04 DIAGNOSIS — R110 Nausea: Secondary | ICD-10-CM

## 2021-11-06 MED ORDER — TRAZODONE HCL 50 MG PO TABS
25.0000 mg | ORAL_TABLET | Freq: Every evening | ORAL | 1 refills | Status: DC | PRN
Start: 2021-11-06 — End: 2021-12-16

## 2021-11-06 NOTE — Telephone Encounter (Signed)
Rx sent in for trazodone with plans to taper seroquel.

## 2021-11-11 ENCOUNTER — Encounter: Payer: Self-pay | Admitting: Internal Medicine

## 2021-11-11 NOTE — Assessment & Plan Note (Signed)
Persistent increased nausea. Seeing Dr Haig Prophet.  (previously saw Dr Tiffany Kocher - retired).  Recent EGD Salmon-colored mucosa suspicious for short-segment Barrett's esophagus. Biopsied. Small hiatal hernia. Gastritis. Biopsied.  Non-bleeding duodenal diverticulum. On PPI.  Has zofran to take prn.  BRAT diet.  Persistent symptoms. Concern raised that seroquel may ben contributing.  Discussed medication.  States was placed on it for sleep years ago.  After discussion, will decrease seroquel to 1/2 tablet q hs.  Start trazodone as directed.  Will plan to titrate up trazodone and decrease and taper off seroquel.  Follow.  Had questions about gastric emptying study.  Questions answered.  Planning to f/u with [facility] in July.

## 2021-11-11 NOTE — Assessment & Plan Note (Signed)
Continue crestor 

## 2021-11-11 NOTE — Assessment & Plan Note (Signed)
Plan to decrease seroquel and start trazodone as outlined.  Follow.

## 2021-11-11 NOTE — Assessment & Plan Note (Signed)
Being followed by oncology.  Elected active surveillance.

## 2021-11-12 DIAGNOSIS — N50819 Testicular pain, unspecified: Secondary | ICD-10-CM | POA: Insufficient documentation

## 2021-11-13 ENCOUNTER — Encounter
Admission: RE | Admit: 2021-11-13 | Discharge: 2021-11-13 | Disposition: A | Discharge status: Home / Self Care | Payer: Medicare Other | Source: Ambulatory Visit | Attending: Gastroenterology | Admitting: Gastroenterology

## 2021-11-13 ENCOUNTER — Encounter: Payer: Self-pay | Admitting: Internal Medicine

## 2021-11-13 DIAGNOSIS — R112 Nausea with vomiting, unspecified: Secondary | ICD-10-CM | POA: Diagnosis not present

## 2021-11-13 DIAGNOSIS — R110 Nausea: Secondary | ICD-10-CM | POA: Insufficient documentation

## 2021-11-13 MED ORDER — TECHNETIUM TC 99M SULFUR COLLOID
2.0000 | Freq: Once | INTRAVENOUS | Status: AC | PRN
  Administered 2021-11-13: 2.16 via ORAL

## 2021-11-13 NOTE — Telephone Encounter (Signed)
Please confirm if any chest pain or sob.  If increased leg swelling and now more unilateral leg swelling - he needs to be evaluated today.

## 2021-11-18 ENCOUNTER — Other Ambulatory Visit: Payer: Self-pay | Admitting: Family

## 2021-11-19 ENCOUNTER — Ambulatory Visit (INDEPENDENT_AMBULATORY_CARE_PROVIDER_SITE_OTHER): Payer: Medicare Other

## 2021-11-19 VITALS — Ht 70.0 in | Wt 166.0 lb

## 2021-11-19 DIAGNOSIS — Z0000 Encounter for general adult medical examination without abnormal findings: Secondary | ICD-10-CM

## 2021-11-19 NOTE — Progress Notes (Signed)
Subjective:   Mike Farley is a 71 y.o. male who presents for Medicare Annual/Subsequent preventive examination.  Review of Systems    No ROS.  Medicare Wellness Virtual Visit.  Visual/audio telehealth visit, UTA vital signs.   See social history for additional risk factors.   Cardiac Risk Factors include: advanced age (>48mn, >>105women);male gender     Objective:    Today's Vitals   11/19/21 1110  Weight: 166 lb (75.3 kg)  Height: '5\' 10"'$  (1.778 m)   Body mass index is 23.82 kg/m.     11/19/2021   11:14 AM 10/06/2021    6:55 AM 04/24/2021    2:00 PM 04/23/2021    6:00 PM 04/21/2021    2:16 PM 11/18/2020   11:49 AM 07/04/2020    6:20 AM  Advanced Directives  Does Patient Have a Medical Advance Directive? Yes No  Yes Yes Yes No  Type of AParamedicof [place]Living will  Healthcare Power of ARural HallLiving will HClarks GreenLiving will [place]Living will   Does patient want to make changes to medical advance directive? No - Patient declined  No - Patient declined   No - Patient declined   Copy of [place] Chart? No - copy requested     No - copy requested   Would patient like information on creating a medical advance directive?       No - Patient declined    Current Medications (verified) Outpatient Encounter Medications as of 11/19/2021  Medication Sig   busPIRone (BUSPAR) 7.5 MG tablet Take 7.5 mg by mouth 3 (three) times daily.   cholecalciferol (VITAMIN D3) 25 MCG (1000 UT) tablet Take 1,000 Units by mouth 3 (three) times a week.    ciprofloxacin (CIPRO) 500 MG tablet Take 500 mg by mouth.   clindamycin (CLEOCIN) 300 MG capsule Take 600 mg by mouth See admin instructions. Take 600 mg by mouth prior to dental procedures   cyanocobalamin 1000 MCG tablet Take 1,000 mcg by mouth daily.    donepezil (ARICEPT) 5 MG tablet Take 5 mg by mouth daily.    methocarbamol (ROBAXIN) 500 MG tablet Take 500 mg by mouth 3 (three) times daily.   Multiple Vitamin (MULTIVITAMIN) tablet Take 1 tablet by mouth daily. ABC Complete MEN   ondansetron (ZOFRAN) 4 MG tablet TAKE ONE TABLET BY MOUTH EVERY 8 HOURS AS NEEDED FOR NAUSEA OR VOMITING   oxyCODONE (ROXICODONE) 15 MG immediate release tablet Take 15 mg by mouth every 4 (four) hours as needed for pain.   oxymetazoline (AFRIN) 0.05 % nasal spray Place 1 spray into both nostrils at bedtime.   pantoprazole (PROTONIX) 40 MG tablet Take 1 tablet (40 mg total) by mouth daily.   promethazine (PHENERGAN) 12.5 MG tablet Take 12.5 mg by mouth every 6 (six) hours as needed for nausea or vomiting.   Prucalopride Succinate (MOTEGRITY) 2 MG TABS Take by mouth.   QUEtiapine (SEROQUEL) 300 MG tablet TAKE ONE TABLET AT BEDTIME.   rosuvastatin (CRESTOR) 10 MG tablet TAKE ONE TABLET (10 MG) BY MOUTH EVERY DAY   tadalafil (CIALIS) 5 MG tablet Take 5 mg by mouth daily.   tamsulosin (FLOMAX) 0.4 MG CAPS capsule Take 0.4 mg by mouth daily.   traZODone (DESYREL) 50 MG tablet Take 0.5-1 tablets (25-50 mg total) by mouth at bedtime as needed for sleep.   valACYclovir (VALTREX) 500 MG tablet Take 500 mg by mouth  2 (two) times daily.   No facility-administered encounter medications on file as of 11/19/2021.    Allergies (verified) Morphine and Amoxicillin   History: Past Medical History:  Diagnosis Date   Anxiety    Arthritis    shoulder   Cancer ([place])    prostate ca - Md just watching   Cervical pain (neck)    limited mobility   Chronic pain    Decreased ROM of neck    Depression    years ago (minor)   Elevated PSA    History of chicken pox    HLD (hyperlipidemia)    Hx of cold sores    Mild cognitive impairment with memory loss    Neuropathy    Past Surgical History:  Procedure Laterality Date   ABDOMINAL EXPOSURE N/A 04/23/2021   Procedure: ABDOMINAL EXPOSURE;  Surgeon: [name], MD;  Location: [place];  Service: Vascular;  Laterality: N/A;   ANTERIOR FUSION CERVICAL SPINE  2011   4-5 levels   ANTERIOR LUMBAR FUSION N/A 04/23/2021   Procedure: Lumbar four-five,  Lumbar five-Sacral one Anterior Lumbar Interbody Fusion;  Surgeon: Karsten Ro, DO;  Location: [facility];  Service: Neurosurgery;  Laterality: N/A;   APPENDECTOMY     BACK SURGERY     BONE EXCISION Left 09/28/2017   Procedure: BONE EXCISION-TAILORS  EXOSTECTOMY;  Surgeon: Samara Deist, DPM;  Location: [facility];  Service: Podiatry;  Laterality: Left;  IVA LOCAL   CERVICAL DISCECTOMY  2008   C2-C7 Fused    CHOLECYSTECTOMY     COLONOSCOPY  2020   ELBOW FRACTURE SURGERY Left 2004   ESOPHAGOGASTRODUODENOSCOPY (EGD) WITH PROPOFOL N/A 10/06/2021   Procedure: ESOPHAGOGASTRODUODENOSCOPY (EGD) WITH PROPOFOL;  Surgeon: Lesly Rubenstein, MD;  Location: [facility];  Service: Endoscopy;  Laterality: N/A;   FRACTURE SURGERY     JOINT REPLACEMENT     right knee   LUMBAR PERCUTANEOUS PEDICLE SCREW 2 LEVEL N/A 04/23/2021   Procedure: LUMBAR PERCUTANEOUS PEDICLE SCREW LUMBAR FOUR-SACRAL ONE;  Surgeon: Karsten Ro, DO;  Location: [facility];  Service: Neurosurgery;  Laterality: N/A;   METATARSAL HEAD EXCISION Left 07/04/2020   Procedure: METATARSAL HEAD EXCISION;  Surgeon: Samara Deist, DPM;  Location: [facility];  Service: Podiatry;  Laterality: Left;   PROSTATE BIOPSY N/A 03/15/2019   Procedure: PROSTATE BIOPSY Uro Nav;  Surgeon: Royston Cowper, MD;  Location: [facility];  Service: Urology;  Laterality: N/A;   RADIOLOGY WITH ANESTHESIA N/A 04/22/2020   Procedure: MRI WITH ANESTHESIA BRAIN WITHOUT CONTRAST;  Surgeon: Radiologist, Medication, MD;  Location: [facility];  Service: Radiology;  Laterality: N/A;   SHOULDER SURGERY Right    TONSILLECTOMY AND ADENOIDECTOMY     TOTAL KNEE ARTHROPLASTY Right 2005   x 4   Family History  Problem Relation Age of Onset   Arthritis Mother    Cancer Mother        colon & breast cnacer    Mental illness Mother        Depression & bipolar   Cancer Father        lung & prostate cancer   Alcohol abuse Brother    Kidney disease Brother    Social History   Socioeconomic History   Marital status: Married    Spouse name: Not on file   Number of children: Not on file   Years of education: Not on file   Highest education level: Not on file  Occupational History   Not on  file  Tobacco Use   Smoking status: Former   Smokeless tobacco: Current    Types: Chew   Tobacco comments:    Quit yrs ago  Vaping Use   Vaping Use: Never used  Substance and Sexual Activity   Alcohol use: Yes    Alcohol/week: 7.0 standard drinks of alcohol    Types: 7 Standard drinks or equivalent per week    Comment: beer/wine   Drug use: Never   Sexual activity: Not Currently  Other Topics Concern   Not on file  Social History Narrative   Not on file   Social Determinants of Health   Financial Resource Strain: Low Risk  (11/19/2021)   Overall Financial Resource Strain (CARDIA)    Difficulty of Paying Living Expenses: Not hard at all  Food Insecurity: No Food Insecurity (11/19/2021)   Hunger Vital Sign    Worried About Running Out of Food in the Last Year: Never true    Ran Out of Food in the Last Year: Never true  Transportation Needs: No Transportation Needs (11/19/2021)   PRAPARE - Hydrologist (Medical): No    Lack of Transportation (Non-Medical): No  Physical Activity: Sufficiently Active (11/19/2021)   Exercise Vital Sign    Days of Exercise per Week: 5 days    Minutes of Exercise per Session: 30 min  Stress: No Stress Concern Present (11/19/2021)   Comfort    Feeling of Stress : Not at all  Social Connections: Unknown (11/19/2021)   Social Connection and Isolation Panel [NHANES]    Frequency of Communication with Friends and Family: Not on file    Frequency of Social Gatherings with  Friends and Family: Not on file    Attends Religious Services: Not on file    Active Member of Clubs or Organizations: Not on file    Attends Archivist Meetings: Not on file    Marital Status: Married    Tobacco Counseling Ready to quit: Not Answered Counseling given: Not Answered Tobacco comments: Quit yrs ago   Clinical Intake: Pre-visit preparation completed: Yes        Diabetes: No  How often do you need to have someone help you when you read instructions, pamphlets, or other written materials from your doctor or pharmacy?: 1 - Never  Interpreter Needed?: No    Activities of Daily Living    11/19/2021   11:01 AM 04/23/2021    6:00 PM  In your present state of health, do you have any difficulty performing the following activities:  Hearing? 0 0  Vision? 0 0  Difficulty concentrating or making decisions? 0 1  Walking or climbing stairs? 0 0  Dressing or bathing? 0 0  Doing errands, shopping? 0 0  Preparing Food and eating ? N   Using the Toilet? N   In the past six months, have you accidently leaked urine? N   Do you have problems with loss of bowel control? Y   Comment Followed by GI and PCP   Managing your Medications? N   Managing your Finances? N   Housekeeping or managing your Housekeeping? N     Patient Care Team: Einar Pheasant, MD as PCP - General (Internal Medicine)  Indicate any recent Medical Services you may have received from other than [facility] providers in the past year (date may be approximate).     Assessment:   This is a routine wellness examination  for Mike Farley.  Virtual Visit via Telephone Note  I connected with  Coy Saunas on 11/19/21 at 11:00 AM EDT by telephone and verified that I am speaking with the correct person using two identifiers.  Persons participating in the virtual visit: patient/Nurse Health Advisor   I discussed the limitations of performing an evaluation and management service by telehealth. We continued  and completed visit with audio only. Some vital signs may be absent or patient reported.   Hearing/Vision screen Hearing Screening - Comments:: Patient is able to hear conversational tones without difficulty. No issues reported.  Vision Screening - Comments:: They have seen their ophthalmologist in the last 12 months.  Wears glasses.   Dietary issues and exercise activities discussed: Current Exercise Habits: Home exercise routine, Type of exercise: walking, Time (Minutes): 30, Frequency (Times/Week): 5, Weekly Exercise (Minutes/Week): 150, Intensity: Mild   Goals Addressed               This Visit's Progress     Patient Stated     Maintain Healthy Lifestyle (pt-stated)   On track     Stay active. Healthy diet. Sleep well.       Depression Screen    11/19/2021   11:06 AM 11/03/2021   11:45 AM 11/18/2020   11:32 AM 11/02/2019    3:00 PM 01/24/2019   10:41 AM 10/10/2018   10:28 AM 07/10/2018   10:26 AM  PHQ 2/9 Scores  PHQ - 2 Score 0 0 0 0 0 0 0  PHQ- 9 Score      2     Fall Risk    11/19/2021   11:06 AM 11/03/2021   11:45 AM 12/18/2020    2:06 PM 11/18/2020   11:36 AM 09/29/2020    1:35 PM  Fall Risk   Falls in the past year? 0 0 0 0 0  Number falls in past yr: 0  0 0 0  Injury with Fall?   0 0 0  Risk for fall due to :  No Fall Risks No Fall Risks    Follow up Falls evaluation completed Falls evaluation completed Falls evaluation completed Falls evaluation completed Falls evaluation completed    [place]: Home free of loose throw rugs in walkways, pet beds, electrical cords, etc? Yes  Adequate lighting in your home to reduce risk of falls? Yes   ASSISTIVE DEVICES UTILIZED TO PREVENT FALLS: Life alert? No  Use of a cane, walker or w/c? No   TIMED UP AND GO: Was the test performed? No .   Cognitive Function: Patient is alert and oriented x3.  Enjoys word games, puzzles, reading and other brain health stimulating activities.        10/06/2017   11:38 AM  MMSE - Mini Mental State Exam  Orientation to time 5  Orientation to Place 5  Registration 3  Attention/ Calculation 5  Recall 3  Language- name 2 objects 2  Language- repeat 1  Language- follow 3 step command 3  Language- read & follow direction 1  Write a sentence 1  Copy design 1  Total score 30        11/02/2019    3:02 PM  6CIT Screen  What Year? 0 points  What month? 0 points  Months in reverse 0 points  Repeat phrase 0 points    Immunizations Immunization History  Administered Date(s) Administered   Influenza, High Dose Seasonal PF 03/12/2020, 02/25/2021   Influenza-Unspecified  05/04/2017, 03/13/2018, 02/19/2019   PFIZER Comirnaty(Gray Top)Covid-19 Tri-Sucrose Vaccine 03/29/2020, 09/23/2020   PFIZER(Purple Top)SARS-COV-2 Vaccination 05/28/2019, 06/18/2019, 03/29/2020   Pneumococcal Conjugate-13 06/27/2015   Pneumococcal Polysaccharide-23 06/24/2017   Tdap 10/21/2015, 10/07/2020   Zoster Recombinat (Shingrix) 05/08/2019, 09/04/2019   Screening Tests Health Maintenance  Topic Date Due   COVID-19 Vaccine (6 - Booster for Pfizer series) 11/19/2021 (Originally 11/18/2020)   INFLUENZA VACCINE  12/22/2021   COLONOSCOPY (Pts 45-54yr Insurance coverage will need to be confirmed)  06/24/2027   TETANUS/TDAP  10/08/2030   Pneumonia Vaccine 71 Years old  Completed   Hepatitis C Screening  Completed   Zoster Vaccines- Shingrix  Completed   HPV VACCINES  Aged Out   Health Maintenance There are no preventive care reminders to display for this patient.  Lung Cancer Screening: (Low Dose CT Chest recommended if Age 71-80years, 30 pack-year currently smoking OR have quit w/in 15years.) does not qualify.   Vision Screening: Recommended annual ophthalmology exams for early detection of glaucoma and other disorders of the eye.  Dental Screening: Recommended annual dental exams for proper oral hygiene.  Community Resource Referral / Chronic  Care Management: CRR required this visit?  No   CCM required this visit?  No      Plan:   Keep all routine maintenance appointments.   I have personally reviewed and noted the following in the patient's chart:   Medical and social history Use of alcohol, tobacco or illicit drugs  Current medications and supplements including opioid prescriptions. Patient is currently taking opioid prescriptions. Information provided to patient regarding non-opioid alternatives. Patient advised to discuss non-opioid treatment plan with their provider. [facility]  Functional ability and status Nutritional status Physical activity Advanced directives List of other physicians Hospitalizations, surgeries, and ER visits in previous 12 months Vitals Screenings to include cognitive, depression, and falls Referrals and appointments  In addition, I have reviewed and discussed with patient certain preventive protocols, quality metrics, and best practice recommendations. A written personalized care plan for preventive services as well as general preventive health recommendations were provided to patient.     OVarney Biles LPN   [id number]

## 2021-11-19 NOTE — Patient Instructions (Addendum)
  Mike Farley , Thank you for taking time to come for your Medicare Wellness Visit. I appreciate your ongoing commitment to your health goals. Please review the following plan we discussed and let me know if I can assist you in the future.   These are the goals we discussed:  Goals       Patient Stated     Maintain Healthy Lifestyle (pt-stated)      Stay active. Healthy diet. Sleep well.        This is a list of the screening recommended for you and due dates:  Health Maintenance  Topic Date Due   COVID-19 Vaccine (6 - Booster for Pfizer series) 11/19/2021*   Flu Shot  12/22/2021   Colon Cancer Screening  06/24/2027   Tetanus Vaccine  10/08/2030   Pneumonia Vaccine  Completed   Hepatitis C Screening: USPSTF Recommendation to screen - Ages 18-79 yo.  Completed   Zoster (Shingles) Vaccine  Completed   HPV Vaccine  Aged Out  *Topic was postponed. The date shown is not the original due date.

## 2021-11-26 DIAGNOSIS — M4802 Spinal stenosis, cervical region: Secondary | ICD-10-CM | POA: Diagnosis not present

## 2021-11-26 DIAGNOSIS — G894 Chronic pain syndrome: Secondary | ICD-10-CM | POA: Diagnosis not present

## 2021-11-26 DIAGNOSIS — Z79891 Long term (current) use of opiate analgesic: Secondary | ICD-10-CM | POA: Diagnosis not present

## 2021-11-26 DIAGNOSIS — M47812 Spondylosis without myelopathy or radiculopathy, cervical region: Secondary | ICD-10-CM | POA: Diagnosis not present

## 2021-11-27 ENCOUNTER — Other Ambulatory Visit: Payer: Self-pay | Admitting: Internal Medicine

## 2021-11-27 NOTE — Telephone Encounter (Signed)
Rx sent in for seroquel.

## 2021-12-10 ENCOUNTER — Encounter: Payer: Self-pay | Admitting: *Deleted

## 2021-12-11 ENCOUNTER — Encounter: Payer: Self-pay | Admitting: Internal Medicine

## 2021-12-11 ENCOUNTER — Ambulatory Visit (INDEPENDENT_AMBULATORY_CARE_PROVIDER_SITE_OTHER): Payer: Medicare Other | Admitting: Internal Medicine

## 2021-12-11 VITALS — BP 118/70 | HR 90 | Ht 70.0 in | Wt 165.0 lb

## 2021-12-11 DIAGNOSIS — R140 Abdominal distension (gaseous): Secondary | ICD-10-CM | POA: Diagnosis not present

## 2021-12-11 DIAGNOSIS — K5903 Drug induced constipation: Secondary | ICD-10-CM | POA: Diagnosis not present

## 2021-12-11 DIAGNOSIS — Z86010 Personal history of colonic polyps: Secondary | ICD-10-CM | POA: Diagnosis not present

## 2021-12-11 DIAGNOSIS — R110 Nausea: Secondary | ICD-10-CM | POA: Diagnosis not present

## 2021-12-11 DIAGNOSIS — T402X5A Adverse effect of other opioids, initial encounter: Secondary | ICD-10-CM

## 2021-12-11 MED ORDER — NALOXEGOL OXALATE 25 MG PO TABS: 25.0000 mg | ORAL_TABLET | Freq: Every day | ORAL | 1 refills | Status: DC

## 2021-12-11 NOTE — Progress Notes (Signed)
Patient ID: Mike Farley, male   DOB: 04/22/1951, 71 y.o.   MRN: [id number] HPI: Azari Hasler is a 71 year old male with a GI history of prior cholecystectomy, duodenal diverticulum, colonic diverticulosis, colon polyps (hyperplastic in 2002; 1 polyp unknown because of non-retrieval in 2019), and medical history including but not limited to degenerative disc disease with multiple cervical and lumbar surgeries, chronic back pain on oxycodone, prostate cancer under surveillance, mild cognitive impairment who is seen to evaluate nausea.  He is here today with his wife.  His medical and GI care has been in [place] with [place], [place], [place].  He had an upper endoscopy as below in May.  He reports that he developed nausea which was severe around 09/14/2021.  He reports etiology has been unknown.  It was associated with decreased appetite and a 17 pound weight loss.  Only on one occasion and very early in the nausea symptom that he had vomiting.  He has had no abdominal pain.  He has noted abdominal bloating and fullness.  He has had intermittent issues with constipation and twice in the last 2 weeks has had a near fecal impaction.  He reports he spent 3 hours on the toilet before he was able to have a good bowel movement.  No blood in stool or melena.  He reports he has gained 7 pounds back as the nausea has recently been somewhat better.  He notes that he spent 1 week at the beach and during that time he had very little to no nausea.  He returned on Sunday within 2 days nausea symptoms has returned but not as severe.  Throughout the nausea evaluation he has been started on pantoprazole 40 mg daily as well as probiotic.  He took [place] for 2 months.  He has not been able to tell any difference in this medication.  He is not using Motegrity.  He is not using promethazine Zofran or buspirone.  He has continued the Protonix 40 and probiotic.  He does not report a history of heartburn, GERD or  dysphagia symptom.  His oxycodone dose 15 mg 4 times daily is stable and chronic.  His wife asks if anxiety could be contributing to nausea.  The patient notes that he is very Type A but he does not feel nervous, worried, anxious or depressed.  He has had chronic issues with insomnia but is taking Seroquel at stable dose which helps with sleep.  He does remain active and reports he can walk 5 or 6 miles per day.  EGD: 10/06/2021 1 tongue of salmon-colored mucosa, 1 cm in length.  Biopsied. Small hiatal hernia Patchy antral erythema. 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 pylori, metaplasia or dysplasia  Colonoscopy 06/23/2017; sessile polyp removed but not retrieved 5-year recall suggested  Colonoscopy 04/14/2001; 5 polyps removed from the rectum, sigmoid and descending colon.  All hyperplastic  Past Medical History:  Diagnosis Date   Anxiety    Arthritis    shoulder   Cancer ([place])    prostate ca - Md just watching   Cervical pain (neck)    limited mobility   Chronic pain    Decreased ROM of neck    Depression    years ago (minor)   Diverticulosis    Duodenal diverticulum    Elevated PSA    Hiatal hernia    History of chicken pox    HLD (hyperlipidemia)  Hx of cold sores    Internal hemorrhoids    Mild cognitive impairment with memory loss    Neuropathy     Past Surgical History:  Procedure Laterality Date   ABDOMINAL EXPOSURE N/A 04/23/2021   Procedure: ABDOMINAL EXPOSURE;  Surgeon: [name], MD;  Location: [facility];  Service: Vascular;  Laterality: N/A;   [place]     4-5 levels; has had 3-4 surgeries for this   ANTERIOR LUMBAR FUSION N/A 04/23/2021   Procedure: Lumbar four-five,  Lumbar five-Sacral one Anterior Lumbar Interbody Fusion;  Surgeon: Dawley, Theodoro Doing, DO;  Location: [facility];  Service: Neurosurgery;  Laterality: N/A;   APPENDECTOMY      BACK SURGERY     BONE EXCISION Left 09/28/2017   Procedure: BONE EXCISION-TAILORS  EXOSTECTOMY;  Surgeon: Samara Deist, DPM;  Location: [facility];  Service: Podiatry;  Laterality: Left;  IVA LOCAL   CERVICAL DISCECTOMY  2008   C2-C7 Fused    CHOLECYSTECTOMY     COLONOSCOPY  2020   COLONOSCOPY  2019   [facility] Due 03/2022 per patient   ELBOW FRACTURE SURGERY Left 1990   ESOPHAGOGASTRODUODENOSCOPY (EGD) WITH PROPOFOL N/A 10/06/2021   Procedure: ESOPHAGOGASTRODUODENOSCOPY (EGD) WITH PROPOFOL;  Surgeon: Lesly Rubenstein, MD;  Location: [facility];  Service: Endoscopy;  Laterality: N/A;   LUMBAR PERCUTANEOUS PEDICLE SCREW 2 LEVEL N/A 04/23/2021   Procedure: LUMBAR PERCUTANEOUS PEDICLE SCREW LUMBAR FOUR-SACRAL ONE;  Surgeon: Karsten Ro, DO;  Location: [facility];  Service: Neurosurgery;  Laterality: N/A;   METATARSAL HEAD EXCISION Left 07/04/2020   Procedure: METATARSAL HEAD EXCISION;  Surgeon: Samara Deist, DPM;  Location: [facility];  Service: Podiatry;  Laterality: Left;   PROSTATE BIOPSY N/A 03/15/2019   Procedure: PROSTATE BIOPSY Uro Nav;  Surgeon: Royston Cowper, MD;  Location: [facility];  Service: Urology;  Laterality: N/A;   RADIOLOGY WITH ANESTHESIA N/A 04/22/2020   Procedure: MRI WITH ANESTHESIA BRAIN WITHOUT CONTRAST;  Surgeon: Radiologist, Medication, MD;  Location: [facility];  Service: Radiology;  Laterality: N/A;   REPLACEMENT TOTAL KNEE Right    right knee   ROTATOR CUFF REPAIR Right 2018   with arthroscopy and distal claviculectomy   TONSILLECTOMY AND ADENOIDECTOMY     TOTAL KNEE ARTHROPLASTY Right 2005   x 4    Outpatient Medications Prior to Visit  Medication Sig Dispense Refill   busPIRone (BUSPAR) 7.5 MG tablet Take 7.5 mg by mouth 3 (three) times daily.     cholecalciferol (VITAMIN D3) 25 MCG (1000 UT) tablet Take 1,000 Units by mouth 3 (three) times a week.      ciprofloxacin (CIPRO) 500 MG tablet Take 500 mg by mouth.      clindamycin (CLEOCIN) 300 MG capsule Take 600 mg by mouth See admin instructions. Take 600 mg by mouth prior to dental procedures     cyanocobalamin 1000 MCG tablet Take 1,000 mcg by mouth daily.      donepezil (ARICEPT) 5 MG tablet Take 5 mg by mouth daily.     methocarbamol (ROBAXIN) 500 MG tablet Take 500 mg by mouth 3 (three) times daily.     Multiple Vitamin (MULTIVITAMIN) tablet Take 1 tablet by mouth daily. ABC Complete MEN     ondansetron (ZOFRAN) 4 MG tablet TAKE ONE TABLET BY MOUTH EVERY 8 HOURS AS NEEDED FOR NAUSEA OR VOMITING 21 tablet 0   oxyCODONE (ROXICODONE) 15 MG immediate release tablet Take 15 mg by mouth every  4 (four) hours as needed for pain.     oxymetazoline (AFRIN) 0.05 % nasal spray Place 1 spray into both nostrils at bedtime.     pantoprazole (PROTONIX) 40 MG tablet Take 1 tablet (40 mg total) by mouth daily. 30 tablet 3   promethazine (PHENERGAN) 12.5 MG tablet Take 12.5 mg by mouth every 6 (six) hours as needed for nausea or vomiting.     Prucalopride Succinate (MOTEGRITY) 2 MG TABS Take by mouth.     QUEtiapine (SEROQUEL) 300 MG tablet 1/2 - 1 q hs 90 tablet 1   rosuvastatin (CRESTOR) 10 MG tablet TAKE ONE TABLET (10 MG) BY MOUTH EVERY DAY 30 tablet 5   tadalafil (CIALIS) 5 MG tablet Take 5 mg by mouth daily.     tamsulosin (FLOMAX) 0.4 MG CAPS capsule Take 0.4 mg by mouth daily.     traZODone (DESYREL) 50 MG tablet Take 0.5-1 tablets (25-50 mg total) by mouth at bedtime as needed for sleep. 30 tablet 1   valACYclovir (VALTREX) 500 MG tablet Take 500 mg by mouth 2 (two) times daily.     No facility-administered medications prior to visit.    Allergies  Allergen Reactions   Morphine Itching and Rash   Amoxicillin     REACTION: abdominal distention and churning pain in abdomen. Did it involve swelling of the face/tongue/throat, SOB, or low BP? No Did it involve sudden or severe rash/hives, skin peeling, or any reaction on the inside of your mouth or nose?  No Did you need to seek medical attention at a hospital or doctor's office? No When did it last happen?      40+ years ago If all above answers are "NO", may proceed with cephalosporin use.     Family History  Problem Relation Age of Onset   Arthritis Mother    Breast cancer Mother    Mental illness Mother    Colon cancer Mother    Bipolar disorder Mother    Depression Mother    Colon polyps Mother    Lung cancer Father    Prostate cancer Father    Alcohol abuse Brother    Kidney disease Brother    Esophageal cancer Neg Hx     Social History   Tobacco Use   Smoking status: Former   Smokeless tobacco: Former    Types: Chew   Tobacco comments:    Quit 50 yrs ago for smoking and quit chewing 2 months ago  Vaping Use   Vaping Use: Never used  Substance Use Topics   Alcohol use: Yes    Alcohol/week: 7.0 standard drinks of alcohol    Types: 7 Standard drinks or equivalent per week    Comment: beer/wine   Drug use: Never    ROS: As per history of present illness, otherwise negative  BP 118/70   Pulse 90   Ht '5\' 10"'$  (1.778 m)   Wt 165 lb (74.8 kg)   BMI 23.68 kg/m  Constitutional: Well-developed and well-nourished. No distress. HEENT: Normocephalic and atraumatic.  No scleral icterus. Cardiovascular: Normal rate, regular rhythm and intact distal pulses. No M/R/G Pulmonary/chest: Effort normal and breath sounds normal. No wheezing, rales or rhonchi. Abdominal: Soft, nontender, nondistended. Bowel sounds active throughout. There are no masses palpable. No hepatosplenomegaly. Extremities: no clubbing, cyanosis, or edema Neurological: Alert and oriented to person place and time. Skin: Skin is warm and dry.  Psychiatric: Normal mood and affect. Behavior is normal.  RELEVANT LABS AND IMAGING: CBC  Component Value Date/Time   WBC 5.5 09/22/2021 1646   RBC 4.33 09/22/2021 1646   HGB 12.9 (L) 09/22/2021 1646   HCT 38.1 (L) 09/22/2021 1646   PLT 224.0 09/22/2021  1646   MCV 88.0 09/22/2021 1646   MCH 31.3 04/24/2021 0422   MCHC 33.9 09/22/2021 1646   RDW 15.0 09/22/2021 1646   LYMPHSABS 1.3 09/22/2021 1646   MONOABS 0.4 09/22/2021 1646   EOSABS 0.0 09/22/2021 1646   BASOSABS 0.0 09/22/2021 1646    CMP     Component Value Date/Time   NA 138 09/23/2021 1429   NA 141 06/20/2018 1612   K 3.9 09/23/2021 1429   CL 100 09/23/2021 1429   CO2 26 09/23/2021 1429   GLUCOSE 94 09/23/2021 1429   BUN 10 09/23/2021 1429   BUN 9 06/20/2018 1612   CREATININE 1.00 09/23/2021 1429   CALCIUM 10.0 09/23/2021 1429   PROT 7.3 09/22/2021 1646   PROT 6.7 06/20/2018 1612   ALBUMIN 4.6 09/22/2021 1646   ALBUMIN 4.5 06/20/2018 1612   AST 19 09/22/2021 1646   ALT 13 09/22/2021 1646   ALKPHOS 88 09/22/2021 1646   BILITOT 0.4 09/22/2021 1646   BILITOT 0.3 06/20/2018 1612   GFRNONAA >60 04/24/2021 0422   GFRAA 103 06/20/2018 1612   PET: Skull-base to mid-thigh with non-diagnostic CT  SUBSEQUENT   Indication: 71 years Male Prostate cancer, recurrence suspected, restaging,  C61 Malignant neoplasm of prostate (CMS-HCC)   Radiopharmaceutical: 9.59 mCi 31F-piflufolastat (Pylarify, [id number]),  intravenously.   Technique: PET/CT imaging was performed using routine acquisition protocol  following intravenous administration of radiopharmaceutical. A single  breath-hold CT scan was performed at quiet end-expiration for anatomic  localization and attenuation correction.   Uptake time: 72 min   Oral Contrast: RediCat   Complications: None   Comparison studies: Prostate MRI 09/08/2021    FINDINGS:   Head and Neck:   - Physiologic uptake in the salivary and lacrimal glands   Chest:  No concerning pulmonary nodules   Abdomen/Pelvis:   - Cholecystectomy. Physiologic renal and urinary activity. Left renal  cyst. Colonic diverticulosis.   - No focal hepatic or splenic lesions.   - Prostate, seminal vesicles: Markedly tracer avid lesion of the right   paramidline transition zone of the prostate mid gland and base (image 299).  Seminal vesicles are unremarkable.   - Nodes: No tracer-avid retroperitoneal, mesenteric, or pelvic nodes.   Musculoskeletal:   - Posterior spinal fusion hardware. No concerning osseous lesions.   - Subcutaneous soft tissues unremarkable.    IMPRESSION:  * Prostate: Tracer avid lesion of the right paramidline transition zone of  the prostate mid gland and base.  *  Nodes: No PSMA-avid  pelvic, retroperitoneal, or mesenteric adenopathy  *  Distant metastases: No visceral or osseous metastatic disease   Electronically Reviewed by:  Dessa Phi, MD, [facility]  Electronically Reviewed on:  10/07/2021 5:27 PM   I have reviewed the images and concur with the above findings.   Electronically Signed by:  Santo Held, MD, [facility]  Electronically Signed on:  10/08/2021 8:02 AM   CT ABDOMEN AND PELVIS WITH CONTRAST   TECHNIQUE: Multidetector CT imaging of the abdomen and pelvis was performed using the standard protocol following bolus administration of intravenous contrast.   RADIATION DOSE REDUCTION: This exam was performed according to the departmental dose-optimization program which includes automated exposure control, adjustment of the mA and/or kV according to patient size and/or use of iterative reconstruction  technique.   CONTRAST:  145m OMNIPAQUE IOHEXOL 300 MG/ML  SOLN   COMPARISON:  CT 06/07/2018   FINDINGS: Lower chest: Minimal elevation of left hemidiaphragm with adjacent basilar atelectasis. No confluent consolidation or pleural effusion.   Hepatobiliary: Cholecystectomy. Left greater than right intrahepatic biliary ductal dilatation is not significantly changed from 2020 exam. The common bile duct measures 13 mm at the porta hepatis, also unchanged. Smooth tapering at the duodenal insertion. No visualized choledocholithiasis. Again seen subcentimeter hypodense liver lesions are  too small to characterize but likely cysts. This is not significantly changed from prior exam.   Pancreas: Mild parenchymal atrophy. No ductal dilatation or inflammation. No evidence of pancreatic mass   Spleen: Normal in size without focal abnormality.   Adrenals/Urinary Tract: No adrenal nodule. No hydronephrosis. No perinephric edema. No visible renal calculi. Simple cyst in the interpolar left kidney has slightly increased in size from prior exam measuring 4 cm, however remains simple. No dedicated further follow-up is recommended. There additional small cortical hypodensities that are too small to accurately characterize but likely also cysts, unchanged, also needing no further follow-up. The urinary bladder is near completely empty.   Stomach/Bowel: Small amount of contrast in the distal esophagus. The stomach is moderately distended with enteric contrast and fluid. No gastric wall thickening. Large periampullary duodenal diverticulum without acute inflammation. No small bowel obstruction or inflammatory change. Appendectomy. Mixed liquid and solid stool within the proximal colon. There is formed stool in the distal transverse, descending and sigmoid colon. Occasional colonic diverticula without focal diverticulitis.   Vascular/Lymphatic: Mild aortic atherosclerosis. No aortic aneurysm. Patent portal, splenic, mesenteric veins. No enlarged lymph nodes in the abdomen or pelvis.   Reproductive: Borderline prostatomegaly.   Other: Trace free fluid in the dependent pelvis, of unknown etiology and significance. No upper abdominal ascites. No omental thickening. Diminutive fat containing umbilical hernia.   Musculoskeletal: Posterior L4-S1 fusion with interbody spacers. Stable and benign T12 vertebral body hemangioma. There are no acute or suspicious osseous abnormalities.   IMPRESSION: 1. Trace free fluid in the dependent pelvis, of unknown etiology and significance, but  likely reactive. 2. Small amount of contrast in the distal esophagus may represent delayed clearance, dysmotility, or reflux. 3. Large periampullary duodenal diverticulum without acute inflammation. 4. Unchanged biliary ductal dilatation postcholecystectomy. This is been stable since January 2020 CT. Recommend correlation with LFTs. If elevated, consider further evaluation with MRCP. 5. Minimal colonic diverticulosis without diverticulitis.   Aortic Atherosclerosis (ICD10-I70.0).     Electronically Signed   By: MKeith RakeM.D.   On: 09/25/2021 15:51  NUCLEAR MEDICINE GASTRIC EMPTYING SCAN   TECHNIQUE: After oral ingestion of radiolabeled meal, sequential abdominal images were obtained for 4 hours. Percentage of activity emptying the stomach was calculated at 1 hour, 2 hour, 3 hour, and 4 hours.   RADIOPHARMACEUTICALS:  2.16 mCi [id number] colloid in standardized meal   COMPARISON:  CT August 26, 2021   FINDINGS: Expected location of the stomach in the left upper quadrant. Ingested meal empties the stomach gradually over the course of the study.   60% emptied at 1 hr ( normal >= 10%)   97% emptied at 2 hr ( normal >= 40%)   98% emptied at 3 hr ( normal >= 70%)   99% emptied at 4 hr ( normal >= 90%)   IMPRESSION: No scintigraphic evidence of delayed gastric emptying.     Electronically Signed   By: JeDahlia Bailiff.D.   On: 11/13/2021  15:17    ASSESSMENT/PLAN: 71 year old male with a GI history of prior cholecystectomy, duodenal diverticulum, colonic diverticulosis, colon polyps (hyperplastic in 2002; 1 polyp unknown because of non-retrieval in 2019), and medical history including but not limited to degenerative disc disease with multiple cervical and lumbar surgeries, chronic back pain on oxycodone, prostate cancer under surveillance, mild cognitive impairment who is seen to evaluate nausea.   Nausea without vomiting --he has had an extensive evaluation to  date including an upper endoscopy which was unrevealing.  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  Also somewhat surprising in the setting of narcotic pain medication.  I would not expect metoclopramide to be of any benefit given gastric hypomotility.  His cross-sectional imaging has been unremarkable and his biliary ductal dilatation is felt secondary to postcholecystectomy state.  His liver enzymes are normal.  We discussed how nausea is a complex symptom and may or may not be truly GI in nature.  His overall chronic and opioid exacerbated constipation may be somewhat contributing to his nausea.  Functional nausea is felt less likely given that this started acutely in April.  It does seem to be improving and may represent a postinfectious nausea which will slowly improve, may relate to constipation see below, and will less likely SIBO. --Stop pantoprazole and probiotic; these were started for this symptom and to date have been ineffective; no findings at EGD to warrant PPI --After 1 week of discontinuation of PPI and probiotic if symptoms persist we will try to work on colonic motility to see if nausea improves, see #2 --If no improvement with Movantik, consider SIBO breath testing --Finally if persistent despite all consider TCA  2.  Chronic constipation/opioid induced constipation --he has been intermittently constipated over time though this has been somewhat worse of late.  He has been on narcotic pain medication for decades and we discussed how certainly the gut has opioid receptors and he may have a component of narcotic bowel.  For this I recommended he try Movantik --Movantik 25 mg daily  3.  History of colon polyp --hyperplastic polyps only in 2002, polyp not retrieved in 2019 and so the most conservative recommendation was to repeat in 2024 --Consider surveillance colonoscopy in January 2024  I asked Jaivyn to send  me a MyChart message in 1 month to let me know how symptoms are progressing  1 hour, 5 min total spent today including patient facing time, coordination of care, reviewing medical history/procedures/pertinent radiology studies, and documentation of the encounter.       [id number], [place], [place] [address] [place],  [place] [id number]

## 2021-12-11 NOTE — Patient Instructions (Signed)
Discontinue pantoprazole.  Discontinue probiotic.  If your symptoms resolve after stopping the above medications, great! If not, lets start Movantik 25 mg daily.   Send a MyChart message to [place] in 1  month to let us know how your nausea is.  You will be due for a recall colonoscopy in 05/2022. We will send you a reminder in the mail when it gets closer to that time.  If you are age 71 or older, your body mass index should be between 23-30. Your Body mass index is 23.68 kg/m. If this is out of the aforementioned range listed, please consider follow up with your Primary Care Provider.  If you are age 57 or younger, your body mass index should be between 19-25. Your Body mass index is 23.68 kg/m. If this is out of the aformentioned range listed, please consider follow up with your Primary Care Provider.   ________________________________________________________  The [place] [facility] providers would like to encourage you to use [facility] to communicate with providers for non-urgent requests or questions.  Due to long hold times on the telephone, sending your provider a message by [facility] may be a faster and more efficient way to get a response.  Please allow 48 business hours for a response.  Please remember that this is for non-urgent requests.  _______________________________________________________  Due to recent changes in healthcare laws, you may see the results of your imaging and laboratory studies on MyChart before your provider has had a chance to review them.  We understand that in some cases there may be results that are confusing or concerning to you. Not all laboratory results come back in the same time frame and the provider may be waiting for multiple results in order to interpret others.  Please give [place] 48 hours in order for your provider to thoroughly review all the results before contacting the office for clarification of your results.

## 2021-12-15 ENCOUNTER — Encounter: Payer: Self-pay | Admitting: Internal Medicine

## 2021-12-16 ENCOUNTER — Ambulatory Visit (INDEPENDENT_AMBULATORY_CARE_PROVIDER_SITE_OTHER): Payer: Medicare Other

## 2021-12-16 ENCOUNTER — Ambulatory Visit: Payer: Medicare Other | Admitting: Family Medicine

## 2021-12-16 ENCOUNTER — Encounter: Payer: Self-pay | Admitting: Family Medicine

## 2021-12-16 ENCOUNTER — Telehealth: Payer: Self-pay | Admitting: *Deleted

## 2021-12-16 ENCOUNTER — Encounter: Payer: Self-pay | Admitting: Internal Medicine

## 2021-12-16 VITALS — BP 114/76 | HR 92 | Temp 97.5°F | Ht 70.0 in | Wt 160.4 lb

## 2021-12-16 DIAGNOSIS — J329 Chronic sinusitis, unspecified: Secondary | ICD-10-CM | POA: Diagnosis not present

## 2021-12-16 DIAGNOSIS — J029 Acute pharyngitis, unspecified: Secondary | ICD-10-CM | POA: Diagnosis not present

## 2021-12-16 DIAGNOSIS — Z8619 Personal history of other infectious and parasitic diseases: Secondary | ICD-10-CM | POA: Diagnosis not present

## 2021-12-16 DIAGNOSIS — M47814 Spondylosis without myelopathy or radiculopathy, thoracic region: Secondary | ICD-10-CM | POA: Diagnosis not present

## 2021-12-16 DIAGNOSIS — R0989 Other specified symptoms and signs involving the circulatory and respiratory systems: Secondary | ICD-10-CM

## 2021-12-16 LAB — POCT RAPID STREP A (OFFICE): Rapid Strep A Screen: NEGATIVE

## 2021-12-16 MED ORDER — VALACYCLOVIR HCL 1 G PO TABS: 1000.0000 mg | ORAL_TABLET | Freq: Two times a day (BID) | ORAL | 0 refills | Status: DC

## 2021-12-16 MED ORDER — FLUTICASONE PROPIONATE 50 MCG/ACT NA SUSP: 2.0000 | Freq: Every day | NASAL | 1 refills | Status: DC

## 2021-12-16 NOTE — Telephone Encounter (Signed)
Pt returning call... Advised pt of below note... Pt stated that juan swabbed throat for his visit today... Advised pt they needed to swab again... Pt stated he will return to office around 1pm..Marland Kitchen

## 2021-12-16 NOTE — Progress Notes (Signed)
    SUBJECTIVE:   CHIEF COMPLAINT / HPI: Chest congestion  Patient reports 3 days of chest congestion and sore throat.  Only has worsened.  Has been hydrating well.  Denies any fevers or dyspnea.  Endorses mild shortness of breath at times, cough with productive green/brown sputum.  Feels like something is on his chest.  Endorses history of sinusitis.  He is concerned as he is going to his daughter's wedding on 26 August and does not want to be sick during that time.  Reports previous episode of sinusitis was greater than 1 month finally got better with antibiotics.  Has been taking Afrin daily for years.  Requesting refill for cold sores.  Previously taking Valtrex.  Feels like burning tingling sensation on lip.  No visible lesions at present  PERTINENT  PMH / PSH:  Recurrent sinusitis  OBJECTIVE:   BP 114/76 (BP Location: Left Arm, Patient Position: Sitting, Cuff Size: Small)   Pulse 92   Temp (!) 97.5 F (36.4 C) (Oral)   Ht '5\' 10"'$  (1.778 m)   Wt 160 lb 6.4 oz (72.8 kg)   SpO2 99%   BMI 23.02 kg/m    General: Alert, no acute distress HEENT: TMs visible bilaterally, no bulging, erythema.  Pharyngeal wall slightly erythematous without edema.  No tonsillar exudate.  No cervical lymphadenopathy.  No visible oral lesions. Cardio: Normal S1 and S2, RRR, no r/m/g Pulm: CTAB, normal work of breathing   ASSESSMENT/PLAN:   Chest congestion Differentials include environmental, infectious etiology seems less likely given 3-day history.  Lung exam benign today.Marland Kitchen  History of tobacco use.  Productive sputum.   Chest x-ray today Increase hydration If symptoms worsen will send in prescription for  Strict return precautions provided.     Acute viral pharyngitis Likely viral pharyngitis given 3-day onset, afebrile, sore throat and hoarseness. -Rapid strep negative, will send throat culture to confirm -Increase fluids -Tylenol/ibuprofen as needed as needed for discomfort -Honey tea for  cough and sore throat -Cepacol lozenges or spray as needed -Follow-up with PCP as needed or if symptoms worsen. -Strict return precautions provided    Recurrent sinusitis Recommend decreasing Afrin usage. Start Flonase 2 sprays to each nostril daily. Humidifier at night Could consider ENT referral if no improvement   History of cold sores No visible lesions seen on oral mucosa.  Tingling and burning sensation present. -Refill Valtrex 1 g twice daily x1 dose. 30 tabs -Follow up with PCP as needed   PDMP reviewed  Carollee Leitz, MD [place]

## 2021-12-16 NOTE — Telephone Encounter (Signed)
Left voicemail for patient to return call. A Strep culture was not collected during visit. Would like to see if patient can come back today to be swabbed.

## 2021-12-16 NOTE — Assessment & Plan Note (Addendum)
Differentials include environmental, infectious etiology seems less likely given 3-day history.  Lung exam benign today.Marland Kitchen  History of tobacco use.  Productive sputum.   Chest x-ray today Increase hydration If symptoms worsen will send in prescription for  Strict return precautions provided.

## 2021-12-16 NOTE — Patient Instructions (Addendum)
It was a pleasure meeting you today. Thank you for allowing me to take part in your health care.  Our goals for today as we discussed include:  For your congestion Use Flonase 2 sprays to each nostril once a day, Increase fluids You can use humidifier at night. Recommend to decrease use of Afrin  For your sore throat Your rapid strep test was negative I will send a throat culture to confirm the results You can use Cepacol lozenges or spray to help with discomfort Honey tea has been helpful to ease cough and sore throat relief.  If your symptoms are no better by Friday, July 28 send me a MyChart message to let me know and I will send in a prescription for antibiotics.  I have sent in a refill for your Valtrex. Take 1000 mg 1 tab in the morning and evening at the onset of outbreak.  This is to be taken for 1 day only.  Please follow-up with PCP as needed  If you have any questions or concerns, please do not hesitate to call the office at [phone].  I look forward to our next visit and until then take care and stay safe.  Regards,   Carollee Leitz, MD   [facility]

## 2021-12-18 LAB — CULTURE, GROUP A STREP
MICRO NUMBER:: 13697269
SPECIMEN QUALITY:: ADEQUATE

## 2021-12-18 MED ORDER — AZITHROMYCIN 250 MG PO TABS: ORAL_TABLET | ORAL | 0 refills | Status: DC

## 2021-12-18 MED ORDER — PREDNISONE 10 MG PO TABS: ORAL_TABLET | ORAL | 0 refills | Status: DC

## 2021-12-18 MED ORDER — DOXYCYCLINE HYCLATE 100 MG PO TABS: 100.0000 mg | ORAL_TABLET | Freq: Two times a day (BID) | ORAL | 0 refills | Status: DC

## 2021-12-18 NOTE — Telephone Encounter (Signed)
Late entry:  called 12/18/21 - (1730).  Reports throat is better.  No increased sinus pressure.  Does report symptoms have progressed - increased chest congestion, cough - productive of yellow mucus.  No sob.  No chest pain. Increased cough. Leaving to go out of town next week.  Prednisone taper as directed.  Doxycyline.  Call with update.

## 2021-12-18 NOTE — Telephone Encounter (Signed)
Lm for pt to cb - mychart message sent as well

## 2021-12-18 NOTE — Telephone Encounter (Signed)
See his my chart message,  the throat culture was negative. Please call and see what specific symptoms he is currently having

## 2021-12-18 NOTE — Telephone Encounter (Signed)
S/w pt - advised that his throat cx was neg.  Pt stated his sx are as follows : lots of coughing - productive - coughing up yellow mucus, nasal congestion, chest congestion, fatigue. Pt denies shortness of breath, chest pain, fever.  Pt is using DayQuil/Nyquil, and nasal spray (flonase)

## 2021-12-20 ENCOUNTER — Encounter: Payer: Self-pay | Admitting: Family Medicine

## 2021-12-22 ENCOUNTER — Encounter: Payer: Self-pay | Admitting: Family Medicine

## 2021-12-22 DIAGNOSIS — J029 Acute pharyngitis, unspecified: Secondary | ICD-10-CM | POA: Insufficient documentation

## 2021-12-22 DIAGNOSIS — Z8619 Personal history of other infectious and parasitic diseases: Secondary | ICD-10-CM | POA: Insufficient documentation

## 2021-12-22 NOTE — Assessment & Plan Note (Signed)
No visible lesions seen on oral mucosa.  Tingling and burning sensation present. -Refill Valtrex 1 g twice daily x1 dose. 30 tabs -Follow up with PCP as needed

## 2021-12-22 NOTE — Assessment & Plan Note (Signed)
Recommend decreasing Afrin usage. Start Flonase 2 sprays to each nostril daily. Humidifier at night Could consider ENT referral if no improvement

## 2021-12-22 NOTE — Assessment & Plan Note (Signed)
Likely viral pharyngitis given 3-day onset, afebrile, sore throat and hoarseness. -Rapid strep negative, will send throat culture to confirm -Increase fluids -Tylenol/ibuprofen as needed as needed for discomfort -Honey tea for cough and sore throat -Cepacol lozenges or spray as needed -Follow-up with PCP as needed or if symptoms worsen. -Strict return precautions provided

## 2022-01-24 ENCOUNTER — Encounter: Payer: Self-pay | Admitting: Internal Medicine

## 2022-01-26 DIAGNOSIS — M47812 Spondylosis without myelopathy or radiculopathy, cervical region: Secondary | ICD-10-CM | POA: Diagnosis not present

## 2022-01-26 DIAGNOSIS — Z79891 Long term (current) use of opiate analgesic: Secondary | ICD-10-CM | POA: Diagnosis not present

## 2022-01-26 DIAGNOSIS — M4802 Spinal stenosis, cervical region: Secondary | ICD-10-CM | POA: Diagnosis not present

## 2022-01-26 DIAGNOSIS — G894 Chronic pain syndrome: Secondary | ICD-10-CM | POA: Diagnosis not present

## 2022-02-03 ENCOUNTER — Telehealth: Payer: Self-pay

## 2022-02-03 DIAGNOSIS — M48061 Spinal stenosis, lumbar region without neurogenic claudication: Secondary | ICD-10-CM | POA: Diagnosis not present

## 2022-02-03 DIAGNOSIS — Z13828 Encounter for screening for other musculoskeletal disorder: Secondary | ICD-10-CM | POA: Diagnosis not present

## 2022-02-03 NOTE — Patient Outreach (Signed)
  Care Coordination   Initial Visit Note   02/03/2022 Name: Mike Farley MRN: [id number] DOB: [birth year]  Mike Farley is a 71 y.o. year old male who sees Einar Pheasant, MD for primary care. I spoke with  Mike Farley by phone today.  What matters to the patients health and wellness today?  Patient states he would be interested in talking about the care coordination program.  He states he has doctor appointments today and will not be available.  Request return call on tomorrow.     Goals Addressed             This Visit's Progress    Care coordination activities       Care Coordination Interventions: Patient scheduled for follow up visit with RNCM to discuss care coordination services/ program.           SDOH assessments and interventions completed:  No     Care Coordination Interventions Activated:  Yes  Care Coordination Interventions:  Yes, provided   Follow up plan: Follow up call scheduled for 02/04/22 at 10:30 am    Encounter Outcome:  Pt. Visit Completed   Quinn Plowman RN,BSN,CCM [place] [phone] direct line

## 2022-02-04 ENCOUNTER — Ambulatory Visit: Payer: Self-pay

## 2022-02-04 NOTE — Patient Outreach (Signed)
  Care Coordination   02/04/2022 Name: CASTEN FLOREN MRN: [id number] DOB: [birth year]   Care Coordination Outreach Attempts:  An unsuccessful telephone outreach was attempted for a scheduled appointment today. Contact states patient is unavailable.  HIPAA compliant message left with call back phone number.   Follow Up Plan:  Additional outreach attempts will be made to offer the patient care coordination information and services.   Encounter Outcome:  Pt. Request to Call Back  Care Coordination Interventions Activated:  No   Care Coordination Interventions:  No, not indicated    Quinn Plowman [facility] [phone] direct line

## 2022-02-24 ENCOUNTER — Other Ambulatory Visit: Payer: Self-pay | Admitting: Internal Medicine

## 2022-02-24 NOTE — Telephone Encounter (Signed)
Refilled seroquel.  Needs a f/u appt scheduled.  Does not have one scheduled.

## 2022-02-24 NOTE — Telephone Encounter (Signed)
LMTCB to schedule follow-up appointment.  

## 2022-03-05 DIAGNOSIS — R319 Hematuria, unspecified: Secondary | ICD-10-CM | POA: Diagnosis not present

## 2022-03-08 DIAGNOSIS — M47812 Spondylosis without myelopathy or radiculopathy, cervical region: Secondary | ICD-10-CM | POA: Diagnosis not present

## 2022-03-08 DIAGNOSIS — M4802 Spinal stenosis, cervical region: Secondary | ICD-10-CM | POA: Diagnosis not present

## 2022-03-08 DIAGNOSIS — G894 Chronic pain syndrome: Secondary | ICD-10-CM | POA: Diagnosis not present

## 2022-03-11 ENCOUNTER — Ambulatory Visit (INDEPENDENT_AMBULATORY_CARE_PROVIDER_SITE_OTHER): Payer: Medicare Other | Admitting: Internal Medicine

## 2022-03-11 ENCOUNTER — Encounter: Payer: Self-pay | Admitting: Internal Medicine

## 2022-03-11 VITALS — BP 130/80 | HR 92 | Temp 98.7°F | Ht 70.0 in | Wt 165.6 lb

## 2022-03-11 DIAGNOSIS — R739 Hyperglycemia, unspecified: Secondary | ICD-10-CM

## 2022-03-11 DIAGNOSIS — I700 Atherosclerosis of aorta: Secondary | ICD-10-CM

## 2022-03-11 DIAGNOSIS — E7800 Pure hypercholesterolemia, unspecified: Secondary | ICD-10-CM | POA: Diagnosis not present

## 2022-03-11 DIAGNOSIS — R110 Nausea: Secondary | ICD-10-CM | POA: Diagnosis not present

## 2022-03-11 DIAGNOSIS — G3184 Mild cognitive impairment, so stated: Secondary | ICD-10-CM | POA: Diagnosis not present

## 2022-03-11 DIAGNOSIS — N281 Cyst of kidney, acquired: Secondary | ICD-10-CM

## 2022-03-11 DIAGNOSIS — C61 Malignant neoplasm of prostate: Secondary | ICD-10-CM

## 2022-03-11 LAB — HEPATIC FUNCTION PANEL
ALT: 18 U/L (ref 0–53)
AST: 26 U/L (ref 0–37)
Albumin: 4.5 g/dL (ref 3.5–5.2)
Alkaline Phosphatase: 105 U/L (ref 39–117)
Bilirubin, Direct: 0.1 mg/dL (ref 0.0–0.3)
Total Bilirubin: 0.7 mg/dL (ref 0.2–1.2)
Total Protein: 6.8 g/dL (ref 6.0–8.3)

## 2022-03-11 LAB — CBC WITH DIFFERENTIAL/PLATELET
Basophils Absolute: 0.1 10*3/uL (ref 0.0–0.1)
Basophils Relative: 0.7 % (ref 0.0–3.0)
Eosinophils Absolute: 0.3 10*3/uL (ref 0.0–0.7)
Eosinophils Relative: 3.1 % (ref 0.0–5.0)
HCT: 39 % (ref 39.0–52.0)
Hemoglobin: 13 g/dL (ref 13.0–17.0)
Lymphocytes Relative: 13.5 % (ref 12.0–46.0)
Lymphs Abs: 1.3 10*3/uL (ref 0.7–4.0)
MCHC: 33.4 g/dL (ref 30.0–36.0)
MCV: 91.9 fl (ref 78.0–100.0)
Monocytes Absolute: 0.8 10*3/uL (ref 0.1–1.0)
Monocytes Relative: 8.1 % (ref 3.0–12.0)
Neutro Abs: 7.3 10*3/uL (ref 1.4–7.7)
Neutrophils Relative %: 74.6 % (ref 43.0–77.0)
Platelets: 216 10*3/uL (ref 150.0–400.0)
RBC: 4.25 Mil/uL (ref 4.22–5.81)
RDW: 14.4 % (ref 11.5–15.5)
WBC: 9.8 10*3/uL (ref 4.0–10.5)

## 2022-03-11 LAB — BASIC METABOLIC PANEL
BUN: 21 mg/dL (ref 6–23)
CO2: 30 mEq/L (ref 19–32)
Calcium: 9.2 mg/dL (ref 8.4–10.5)
Chloride: 101 mEq/L (ref 96–112)
Creatinine, Ser: 0.92 mg/dL (ref 0.40–1.50)
GFR: 83.53 mL/min (ref >60.00)
Glucose, Bld: 95 mg/dL (ref 70–99)
Potassium: 4.2 mEq/L (ref 3.5–5.1)
Sodium: 140 mEq/L (ref 135–145)

## 2022-03-11 LAB — LIPID PANEL
Cholesterol: 162 mg/dL (ref 0–200)
HDL: 74.2 mg/dL (ref >39.00)
LDL Cholesterol: 73 mg/dL (ref 0–99)
NonHDL: 87.65
Total CHOL/HDL Ratio: 2
Triglycerides: 73 mg/dL (ref 0.0–149.0)
VLDL: 14.6 mg/dL (ref 0.0–40.0)

## 2022-03-11 LAB — TSH: TSH: 3.01 u[IU]/mL (ref 0.35–5.50)

## 2022-03-11 LAB — HEMOGLOBIN A1C: Hgb A1c MFr Bld: 5.7 % (ref 4.6–6.5)

## 2022-03-11 NOTE — Progress Notes (Signed)
Patient ID: Mike Farley, male   DOB: [birth year], 71 y.o.   MRN: [id number]   Subjective:    Patient ID: Mike Farley, male    DOB: Oct 22, 1950, 71 y.o.   MRN: [id number]   Patient here for  Chief Complaint  Patient presents with   Follow-up   .   HPI Here to follow up regarding his cholesterol and history of increased anxiety.  Recent evaluation by GI for persistent nausea.  PPI stopped.  Recommended f/u colonoscopy in 2024. Symptoms have improved/resolved.  Fusion biopsy - following with urology.  Increased stress related.  Off tamsulosin.  No chest pain.  Breathing stable.  Exercising.  Riding bike/walking.     Past Medical History:  Diagnosis Date   Anxiety    Arthritis    shoulder   Cancer ([place])    prostate ca - Md just watching   Cervical pain (neck)    limited mobility   Chronic pain    Decreased ROM of neck    Depression    years ago (minor)   Diverticulosis    Duodenal diverticulum    Elevated PSA    Hiatal hernia    History of chicken pox    HLD (hyperlipidemia)    Hx of cold sores    Internal hemorrhoids    Mild cognitive impairment with memory loss    Neuropathy    Past Surgical History:  Procedure Laterality Date   ABDOMINAL EXPOSURE N/A 04/23/2021   Procedure: ABDOMINAL EXPOSURE;  Surgeon: [name], MD;  Location: [place];  Service: Vascular;  Laterality: N/A;   [place]     4-5 levels; has had 3-4 surgeries for this   ANTERIOR LUMBAR FUSION N/A 04/23/2021   Procedure: Lumbar four-five,  Lumbar five-Sacral one Anterior Lumbar Interbody Fusion;  Surgeon: Dawley, Theodoro Doing, DO;  Location: [place];  Service: Neurosurgery;  Laterality: N/A;   APPENDECTOMY     BACK SURGERY     BONE EXCISION Left 09/28/2017   Procedure: BONE EXCISION-TAILORS  EXOSTECTOMY;  Surgeon: Samara Deist, DPM;  Location: [place];  Service: Podiatry;  Laterality: Left;  IVA LOCAL   CERVICAL DISCECTOMY  2008   C2-C7 Fused     CHOLECYSTECTOMY     COLONOSCOPY  2020   COLONOSCOPY  2019   [facility] Grat[facility] Due 03/2022 per patient   ELBOW FRACTURE SURGERY Left 1990   ESOPHAGOGASTRODUODENOSCOPY (EGD) WITH PROPOFOL N/A 10/06/2021   Procedure: ESOPHAGOGASTRODUODENOSCOPY (EGD) WITH PROPOFOL;  Surgeon: Lesly Rubenstein, MD;  Location: [facility];  Service: Endoscopy;  Laterality: N/A;   LUMBAR PERCUTANEOUS PEDICLE SCREW 2 LEVEL N/A 04/23/2021   Procedure: LUMBAR PERCUTANEOUS PEDICLE SCREW LUMBAR FOUR-SACRAL ONE;  Surgeon: Karsten Ro, DO;  Location: [place];  Service: Neurosurgery;  Laterality: N/A;   METATARSAL HEAD EXCISION Left 07/04/2020   Procedure: METATARSAL HEAD EXCISION;  Surgeon: Samara Deist, DPM;  Location: [facility];  Service: Podiatry;  Laterality: Left;   PROSTATE BIOPSY N/A 03/15/2019   Procedure: PROSTATE BIOPSY Uro Nav;  Surgeon: Royston Cowper, MD;  Location: [facility];  Service: Urology;  Laterality: N/A;   RADIOLOGY WITH ANESTHESIA N/A 04/22/2020   Procedure: MRI WITH ANESTHESIA BRAIN WITHOUT CONTRAST;  Surgeon: Radiologist, Medication, MD;  Location: [place];  Service: Radiology;  Laterality: N/A;   REPLACEMENT TOTAL KNEE Right    right knee   ROTATOR CUFF REPAIR Right 2018   with arthroscopy and distal claviculectomy  TONSILLECTOMY AND ADENOIDECTOMY     TOTAL KNEE ARTHROPLASTY Right 2005   x 4   Family History  Problem Relation Age of Onset   Arthritis Mother    Breast cancer Mother    Mental illness Mother    Colon cancer Mother    Bipolar disorder Mother    Depression Mother    Colon polyps Mother    Lung cancer Father    Prostate cancer Father    Alcohol abuse Brother    Kidney disease Brother    Esophageal cancer Neg Hx    Social History   Socioeconomic History   Marital status: Married    Spouse name: Not on file   Number of children: Not on file   Years of education: Not on file   Highest education level: Not on file  Occupational History    Not on file  Tobacco Use   Smoking status: Former   Smokeless tobacco: Former    Types: Chew   Tobacco comments:    Quit 50 yrs ago for smoking and quit chewing 2 months ago  Vaping Use   Vaping Use: Never used  Substance and Sexual Activity   Alcohol use: Yes    Alcohol/week: 7.0 standard drinks of alcohol    Types: 7 Standard drinks or equivalent per week    Comment: beer/wine   Drug use: Never   Sexual activity: Not Currently  Other Topics Concern   Not on file  Social History Narrative   Not on file   Social Determinants of Health   Financial Resource Strain: Low Risk  (11/19/2021)   Overall Financial Resource Strain (CARDIA)    Difficulty of Paying Living Expenses: Not hard at all  Food Insecurity: No Food Insecurity (11/19/2021)   Hunger Vital Sign    Worried About Running Out of Food in the Last Year: Never true    Ran Out of Food in the Last Year: Never true  Transportation Needs: No Transportation Needs (11/19/2021)   PRAPARE - Hydrologist (Medical): No    Lack of Transportation (Non-Medical): No  Physical Activity: Sufficiently Active (11/19/2021)   Exercise Vital Sign    Days of Exercise per Week: 5 days    Minutes of Exercise per Session: 30 min  Stress: No Stress Concern Present (11/19/2021)   [place]    Feeling of Stress : Not at all  Social Connections: Unknown (11/19/2021)   Social Connection and Isolation Panel [NHANES]    Frequency of Communication with Friends and Family: Not on file    Frequency of Social Gatherings with Friends and Family: Not on file    Attends Religious Services: Not on file    Active Member of Clubs or Organizations: Not on file    Attends Archivist Meetings: Not on file    Marital Status: Married     Review of Systems  Constitutional:  Negative for appetite change and unexpected weight change.  HENT:  Negative for  congestion and sinus pressure.   Respiratory:  Negative for cough, chest tightness and shortness of breath.   Cardiovascular:  Negative for chest pain, palpitations and leg swelling.  Gastrointestinal:  Negative for abdominal pain, diarrhea, nausea and vomiting.  Genitourinary:  Negative for difficulty urinating and dysuria.  Musculoskeletal:  Negative for joint swelling and myalgias.  Skin:  Negative for color change and rash.  Neurological:  Negative for dizziness, light-headedness and headaches.  Psychiatric/Behavioral:  Negative for agitation and dysphoric mood.        Objective:     BP 130/80 (BP Location: Left Arm, Patient Position: Sitting, Cuff Size: Normal)   Pulse 92   Temp 98.7 F (37.1 C) (Oral)   Ht 5' 10"  (1.778 m)   Wt 165 lb 9.6 oz (75.1 kg)   SpO2 96%   BMI 23.76 kg/m  Wt Readings from Last 3 Encounters:  03/11/22 165 lb 9.6 oz (75.1 kg)  12/16/21 160 lb 6.4 oz (72.8 kg)  12/11/21 165 lb (74.8 kg)    Physical Exam Constitutional:      General: He is not in acute distress.    Appearance: Normal appearance. He is well-developed.  HENT:     Head: Normocephalic and atraumatic.     Right Ear: External ear normal.     Left Ear: External ear normal.  Eyes:     General: No scleral icterus.       Right eye: No discharge.        Left eye: No discharge.  Cardiovascular:     Rate and Rhythm: Normal rate and regular rhythm.  Pulmonary:     Effort: Pulmonary effort is normal. No respiratory distress.     Breath sounds: Normal breath sounds.  Abdominal:     General: Bowel sounds are normal.     Palpations: Abdomen is soft.     Tenderness: There is no abdominal tenderness.  Musculoskeletal:        General: No swelling or tenderness.     Cervical back: Neck supple. No tenderness.  Lymphadenopathy:     Cervical: No cervical adenopathy.  Skin:    Findings: No erythema or rash.  Neurological:     Mental Status: He is alert.  Psychiatric:        Mood and  Affect: Mood normal.        Behavior: Behavior normal.      Outpatient Encounter Medications as of 03/11/2022  Medication Sig   cholecalciferol (VITAMIN D3) 25 MCG (1000 UT) tablet Take 1,000 Units by mouth 3 (three) times a week.    cyanocobalamin 1000 MCG tablet Take 1,000 mcg by mouth daily.    donepezil (ARICEPT) 5 MG tablet Take 5 mg by mouth daily.   fluticasone (FLONASE) 50 MCG/ACT nasal spray Place 2 sprays into both nostrils daily.   Multiple Vitamin (MULTIVITAMIN) tablet Take 1 tablet by mouth daily. ABC Complete MEN   oxyCODONE (ROXICODONE) 15 MG immediate release tablet Take 15 mg by mouth daily in the afternoon.   QUEtiapine (SEROQUEL) 300 MG tablet Take 1 tablet (300 mg total) by mouth at bedtime. 1/2 - 1 q hs   rosuvastatin (CRESTOR) 10 MG tablet TAKE ONE TABLET (10 MG) BY MOUTH EVERY DAY   tadalafil (CIALIS) 5 MG tablet Take 5 mg by mouth daily.   [DISCONTINUED] doxycycline (VIBRA-TABS) 100 MG tablet Take 1 tablet (100 mg total) by mouth 2 (two) times daily.   [DISCONTINUED] naloxegol oxalate (MOVANTIK) 25 MG TABS tablet Take 1 tablet (25 mg total) by mouth daily.   [DISCONTINUED] oxymetazoline (AFRIN) 0.05 % nasal spray Place 1 spray into both nostrils at bedtime.   [DISCONTINUED] predniSONE (DELTASONE) 10 MG tablet Take 4 tablets x 1 day and then decrease by 1/2 tablet per day until down to zero mg.   [DISCONTINUED] tamsulosin (FLOMAX) 0.4 MG CAPS capsule Take 0.4 mg by mouth daily.   No facility-administered encounter medications on file as of 03/11/2022.  Lab Results  Component Value Date   WBC 9.8 03/11/2022   HGB 13.0 03/11/2022   HCT 39.0 03/11/2022   PLT 216.0 03/11/2022   GLUCOSE 95 03/11/2022   CHOL 162 03/11/2022   TRIG 73.0 03/11/2022   HDL 74.20 03/11/2022   LDLCALC 73 03/11/2022   ALT 18 03/11/2022   AST 26 03/11/2022   NA 140 03/11/2022   K 4.2 03/11/2022   CL 101 03/11/2022   CREATININE 0.92 03/11/2022   BUN 21 03/11/2022   CO2 30  03/11/2022   TSH 3.01 03/11/2022   PSA 9.28 (H) 03/31/2021   INR 1.03 05/29/2009   HGBA1C 5.7 03/11/2022    NM GASTRIC EMPTYING  Result Date: 11/13/2021 CLINICAL DATA:  Chronic nausea and vomiting. EXAM: NUCLEAR MEDICINE GASTRIC EMPTYING SCAN TECHNIQUE: After oral ingestion of radiolabeled meal, sequential abdominal images were obtained for 4 hours. Percentage of activity emptying the stomach was calculated at 1 hour, 2 hour, 3 hour, and 4 hours. RADIOPHARMACEUTICALS:  2.16 mCi Tc-30msulfur colloid in standardized meal COMPARISON:  CT August 26, 2021 FINDINGS: Expected location of the stomach in the left upper quadrant. Ingested meal empties the stomach gradually over the course of the study. 60% emptied at 1 hr ( normal >= 10%) 97% emptied at 2 hr ( normal >= 40%) 98% emptied at 3 hr ( normal >= 70%) 99% emptied at 4 hr ( normal >= 90%) IMPRESSION: No scintigraphic evidence of delayed gastric emptying. Electronically Signed   By: JDahlia BailiffM.D.   On: 11/13/2021 15:17       Assessment & Plan:   Problem List Items Addressed This Visit     [place]    Continue crestor.        Hypercholesteremia - Primary    Continue crestor.  Low cholesterol diet and exercise.  Follow lipid panel and liver function tests.       Relevant Orders   CBC with Differential/Platelet (Completed)   Hepatic function panel (Completed)   Lipid panel (Completed)   TSH (Completed)   Basic metabolic panel (Completed)   Hyperglycemia    Low carb diet and exercise.  Follow met b and a1c.       Relevant Orders   Hemoglobin A1c (Completed)   Mild cognitive impairment    Mild cognitive impairment.  Saw neurology.  Started on aricept.  Follow.       Nausea    Resolved.  Not an issue.        Prostate cancer ([facility]    Being followed by oncology.  Fusion biopsy.  F/u with urology.       Renal cyst    CT 09/25/21 - simple cyst.          CEinar Pheasant MD

## 2022-03-12 ENCOUNTER — Encounter: Payer: Self-pay | Admitting: Internal Medicine

## 2022-03-15 DIAGNOSIS — G3184 Mild cognitive impairment, so stated: Secondary | ICD-10-CM | POA: Diagnosis not present

## 2022-03-15 DIAGNOSIS — Z8639 Personal history of other endocrine, nutritional and metabolic disease: Secondary | ICD-10-CM | POA: Diagnosis not present

## 2022-03-15 DIAGNOSIS — G4700 Insomnia, unspecified: Secondary | ICD-10-CM | POA: Diagnosis not present

## 2022-03-15 DIAGNOSIS — I10 Essential (primary) hypertension: Secondary | ICD-10-CM | POA: Diagnosis not present

## 2022-03-21 ENCOUNTER — Encounter: Payer: Self-pay | Admitting: Internal Medicine

## 2022-03-21 NOTE — Assessment & Plan Note (Signed)
Continue crestor.  Low cholesterol diet and exercise. Follow lipid panel and liver function tests.   

## 2022-03-21 NOTE — Assessment & Plan Note (Signed)
Being followed by oncology.  Fusion biopsy.  F/u with urology.

## 2022-03-21 NOTE — Assessment & Plan Note (Signed)
Continue crestor 

## 2022-03-21 NOTE — Assessment & Plan Note (Signed)
CT 09/25/21 - simple cyst.

## 2022-03-21 NOTE — Assessment & Plan Note (Signed)
Low carb diet and exercise.  Follow met b and a1c.  

## 2022-03-21 NOTE — Assessment & Plan Note (Signed)
Mild cognitive impairment.  Saw neurology.  Started on aricept.  Follow.

## 2022-03-21 NOTE — Assessment & Plan Note (Signed)
Resolved.  Not an issue.    

## 2022-03-24 ENCOUNTER — Other Ambulatory Visit: Payer: Self-pay

## 2022-03-24 ENCOUNTER — Telehealth: Payer: Self-pay

## 2022-03-24 MED ORDER — ROSUVASTATIN CALCIUM 10 MG PO TABS: ORAL_TABLET | ORAL | 5 refills | Status: DC

## 2022-03-24 NOTE — Telephone Encounter (Signed)
Patient states he needs a refill for his rosuvastatin (CRESTOR) 10 MG tablet.  Patient states he has been several weeks without this medication.  *Patient states his preferred pharmacy is [facility].

## 2022-04-23 HISTORY — PX: BACK SURGERY: SHX140

## 2022-04-26 ENCOUNTER — Encounter: Payer: Self-pay | Admitting: Internal Medicine

## 2022-05-05 DIAGNOSIS — M47816 Spondylosis without myelopathy or radiculopathy, lumbar region: Secondary | ICD-10-CM | POA: Diagnosis not present

## 2022-05-11 DIAGNOSIS — M4802 Spinal stenosis, cervical region: Secondary | ICD-10-CM | POA: Diagnosis not present

## 2022-05-11 DIAGNOSIS — M47812 Spondylosis without myelopathy or radiculopathy, cervical region: Secondary | ICD-10-CM | POA: Diagnosis not present

## 2022-05-11 DIAGNOSIS — G894 Chronic pain syndrome: Secondary | ICD-10-CM | POA: Diagnosis not present

## 2022-05-24 ENCOUNTER — Other Ambulatory Visit: Payer: Self-pay | Admitting: Internal Medicine

## 2022-05-27 ENCOUNTER — Encounter: Payer: Self-pay | Admitting: Internal Medicine

## 2022-05-27 MED ORDER — ONDANSETRON HCL 4 MG PO TABS: 4.0000 mg | ORAL_TABLET | Freq: Three times a day (TID) | ORAL | 0 refills | Status: DC | PRN

## 2022-05-27 NOTE — Telephone Encounter (Signed)
Rx sent in for zofran.

## 2022-05-28 ENCOUNTER — Encounter: Payer: Self-pay | Admitting: Internal Medicine

## 2022-05-31 DIAGNOSIS — C61 Malignant neoplasm of prostate: Secondary | ICD-10-CM | POA: Diagnosis not present

## 2022-06-04 ENCOUNTER — Encounter: Payer: Self-pay | Admitting: *Deleted

## 2022-06-06 ENCOUNTER — Encounter: Payer: Self-pay | Admitting: Internal Medicine

## 2022-06-11 DIAGNOSIS — C61 Malignant neoplasm of prostate: Secondary | ICD-10-CM | POA: Diagnosis not present

## 2022-06-15 ENCOUNTER — Telehealth: Payer: Self-pay | Admitting: Internal Medicine

## 2022-06-15 ENCOUNTER — Ambulatory Visit: Payer: Medicare HMO | Admitting: Internal Medicine

## 2022-06-15 ENCOUNTER — Encounter: Payer: Self-pay | Admitting: Internal Medicine

## 2022-06-15 VITALS — BP 124/80 | HR 78 | Ht 70.0 in | Wt 177.0 lb

## 2022-06-15 DIAGNOSIS — K5909 Other constipation: Secondary | ICD-10-CM

## 2022-06-15 DIAGNOSIS — K2270 Barrett's esophagus without dysplasia: Secondary | ICD-10-CM | POA: Diagnosis not present

## 2022-06-15 DIAGNOSIS — R110 Nausea: Secondary | ICD-10-CM

## 2022-06-15 DIAGNOSIS — Z86010 Personal history of colonic polyps: Secondary | ICD-10-CM | POA: Diagnosis not present

## 2022-06-15 MED ORDER — PANTOPRAZOLE SODIUM 20 MG PO TBEC: 20.0000 mg | DELAYED_RELEASE_TABLET | Freq: Every day | ORAL | 2 refills | Status: DC

## 2022-06-15 MED ORDER — METOCLOPRAMIDE HCL 5 MG PO TABS: 5.0000 mg | ORAL_TABLET | Freq: Four times a day (QID) | ORAL | 1 refills | Status: DC | PRN

## 2022-06-15 MED ORDER — NA SULFATE-K SULFATE-MG SULF 17.5-3.13-1.6 GM/177ML PO SOLN: 1.0000 | ORAL | 0 refills | Status: DC

## 2022-06-15 NOTE — Telephone Encounter (Signed)
Clarification for prescription given to [facility] at [facility].

## 2022-06-15 NOTE — Telephone Encounter (Signed)
Inbound call from [facility] from patients pharmacy requesting a call back to discuss prescription for Reglan. Please advise.

## 2022-06-15 NOTE — Patient Instructions (Addendum)
You have been scheduled for a colonoscopy. Please follow written instructions given to you at your visit today.  Please pick up your prep supplies at the pharmacy within the next 1-3 days. If you use inhalers (even only as needed), please bring them with you on the day of your procedure.   We have sent the following medications to your pharmacy for you to pick up at your convenience: Reglan  Pantoprazole   D/C Zofran   We have sent the following medications to your pharmacy for you to pick up at your convenience: Suprep  _______________________________________________________  If your blood pressure at your visit was 140/90 or greater, please contact your primary care physician to follow up on this.  _______________________________________________________  If you are age 72 or older, your body mass index should be between 23-30. Your Body mass index is 25.4 kg/m. If this is out of the aforementioned range listed, please consider follow up with your Primary Care Provider.  If you are age 5 or younger, your body mass index should be between 19-25. Your Body mass index is 25.4 kg/m. If this is out of the aformentioned range listed, please consider follow up with your Primary Care Provider.   ________________________________________________________  The [facility] [facility] providers would like to encourage you to use [facility] to communicate with providers for non-urgent requests or questions.  Due to long hold times on the telephone, sending your provider a message by [facility] may be a faster and more efficient way to get a response.  Please allow 48 business hours for a response.  Please remember that this is for non-urgent requests.  _______________________________________________________   Thank you for choosing [facility]  Zenovia Jarred, MD

## 2022-06-15 NOTE — Progress Notes (Signed)
Subjective:    Patient ID: Mike Farley, male    DOB: [birth year], 72 y.o.   MRN: [id number]  HPI [name] is a 72 year old male with a GI history of Barrett's esophagus, of prior cholecystectomy, duodenal diverticulum, colonic diverticulosis, colon polyps (hyperplastic in 2002; 1 polyp unknown because of non-retrieval in 2019), and medical history including but not limited to degenerative disc disease with multiple cervical and lumbar surgeries, chronic back pain on oxycodone, prostate cancer under surveillance, mild cognitive impairment who is seen to for follow-up.  He is here alone today.  He was initially seen on 12/11/2021.  At his initial visit we discussed nausea without vomiting which had largely improved.  PPI and probiotic was stopped.  Movantik was tried but he is unsure if he ever really tried this.  It is not on his med list.  He reports that he had done very well until early January 2024 when he developed nausea and 1 night of intense chills.  No vomiting then or since.  He did have an alcoholic lemonade beverage with dinner that night which is not completely atypical for him.  Nausea has been rather constant since this time.  He is also had early satiety and poor appetite.  He has been able to eat his meals however.  Zofran has not been helpful at all.  He reduced oxycodone from 60 to 40 mg daily since his last visit.  He even tried 2 weeks with 20 mg total each day of oxycodone and the nausea did not improve.  Bowel movements vary but typically occur about every other day.  He still feels more constipated than looser diarrheal stools.  He has episodic "severe extreme diarrhea" but typically this only lasts a day or so.  No blood in stool or melena.  Plans for nano knife/IRE treatment of prostate cancer in the coming days at [facility].  He has been stressed over this decision.   Review of Systems As per HPI, otherwise negative  Current Medications, Allergies, Past Medical  History, Past Surgical History, Family History and Social History were reviewed in [employer] record.    Objective:   Physical Exam BP 124/80   Pulse 78   Ht '5\' 10"'$  (1.778 m)   Wt 177 lb (80.3 kg)   BMI 25.40 kg/m  Gen: awake, alert, NAD HEENT: anicteric Ext: no c/c/e Neuro: nonfocal   EGD: 10/06/2021 1 tongue of salmon-colored mucosa, 1 cm in length.  Biopsied. Small hiatal hernia Patchy antral erythema. 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 pylori, metaplasia or dysplasia   Colonoscopy 06/23/2017; sessile polyp removed but not retrieved 5-year recall suggested   Colonoscopy 04/14/2001; 5 polyps removed from the rectum, sigmoid and descending colon.  All hyperplastic     Assessment & Plan:  72 year old male with a GI history of Barrett's esophagus, of prior cholecystectomy, duodenal diverticulum, colonic diverticulosis, colon polyps (hyperplastic in 2002; 1 polyp unknown because of non-retrieval in 2019), and medical history including but not limited to degenerative disc disease with multiple cervical and lumbar surgeries, chronic back pain on oxycodone, prostate cancer under surveillance, mild cognitive impairment who is seen to for follow-up.  Nausea without vomiting --previous significant nausea in early 2023 resolved for 6 months and then recurrent earlier this month.  Previous extensive workup including upper endoscopy and cross-sectional imaging.  Gastric emptying scan actually showed hypermotility at least on the day of the  scan.  I am suspicious for constipation causing some of his nausea but also possibly intermittent gastroparesis in the setting of oxycodone.  Cannot exclude functional nausea. --Discontinue Zofran as it was not helpful --Trial of metoclopramide 5 mg every 6 hours as needed for nausea --Resume low-dose PPI particularly in the setting of  Barrett's esophagus; pantoprazole 20 mg daily --Purge coming up for colonoscopy and it would be interesting to see if this improves nausea.  If so we should focus more on constipation treatment. --Could consider SIBO testing if symptom persists or even trial of TCA  2.  Chronic constipation/opioid induced constipation --suspicious for overflow diarrhea.  I do not think he ever tried Movantik as discussed last time. --Bowel purge for surveillance colonoscopy --Possible retrial of laxative depending on response  3.  History of colon polyps --surveillance colonoscopy due at this time.  We will proceed in the [facility].  We discussed the risk, benefits and alternatives and he is agreeable and wishes to proceed.  4.  Barrett's esophagus --no dysplasia.  I am resuming low-dose PPI. --Pantoprazole 20 mg daily

## 2022-06-23 ENCOUNTER — Encounter: Payer: Self-pay | Admitting: Internal Medicine

## 2022-06-24 ENCOUNTER — Other Ambulatory Visit: Payer: Self-pay | Admitting: Internal Medicine

## 2022-06-30 ENCOUNTER — Ambulatory Visit (AMBULATORY_SURGERY_CENTER): Payer: Medicare HMO | Admitting: Internal Medicine

## 2022-06-30 ENCOUNTER — Encounter: Payer: Self-pay | Admitting: Internal Medicine

## 2022-06-30 VITALS — BP 142/73 | HR 67 | Temp 97.8°F | Resp 11 | Ht 70.0 in | Wt 177.0 lb

## 2022-06-30 DIAGNOSIS — Z09 Encounter for follow-up examination after completed treatment for conditions other than malignant neoplasm: Secondary | ICD-10-CM | POA: Diagnosis not present

## 2022-06-30 DIAGNOSIS — D122 Benign neoplasm of ascending colon: Secondary | ICD-10-CM

## 2022-06-30 DIAGNOSIS — K635 Polyp of colon: Secondary | ICD-10-CM | POA: Diagnosis not present

## 2022-06-30 DIAGNOSIS — Z86010 Personal history of colonic polyps: Secondary | ICD-10-CM

## 2022-06-30 MED ORDER — SODIUM CHLORIDE 0.9 % IV SOLN: 500.0000 mL | Freq: Once | INTRAVENOUS | Status: DC

## 2022-06-30 MED ORDER — PANTOPRAZOLE SODIUM 20 MG PO TBEC: 20.0000 mg | DELAYED_RELEASE_TABLET | Freq: Every day | ORAL | 3 refills | Status: DC

## 2022-06-30 NOTE — Progress Notes (Signed)
See office note dated 06/15/2022 for details and current H&P  Patient presenting for colonoscopy for surveillance.  He remains appropriate for colonoscopy in the [facility] today.

## 2022-06-30 NOTE — Patient Instructions (Signed)
Please read handouts provided. Continue present medications. Await pathology results.   YOU HAD AN ENDOSCOPIC PROCEDURE TODAY AT THE [facility]:   Refer to the procedure report that was given to you for any specific questions about what was found during the examination.  If the procedure report does not answer your questions, please call your gastroenterologist to clarify.  If you requested that your care partner not be given the details of your procedure findings, then the procedure report has been included in a sealed envelope for you to review at your convenience later.  YOU SHOULD EXPECT: Some feelings of bloating in the abdomen. Passage of more gas than usual.  Walking can help get rid of the air that was put into your GI tract during the procedure and reduce the bloating. If you had a lower endoscopy (such as a colonoscopy or flexible sigmoidoscopy) you may notice spotting of blood in your stool or on the toilet paper. If you underwent a bowel prep for your procedure, you may not have a normal bowel movement for a few days.  Please Note:  You might notice some irritation and congestion in your nose or some drainage.  This is from the oxygen used during your procedure.  There is no need for concern and it should clear up in a day or so.  SYMPTOMS TO REPORT IMMEDIATELY:  Following lower endoscopy (colonoscopy or flexible sigmoidoscopy):  Excessive amounts of blood in the stool  Significant tenderness or worsening of abdominal pains  Swelling of the abdomen that is new, acute  Fever of 100F or higher  For urgent or emergent issues, a gastroenterologist can be reached at any hour by calling [phone]. Do not use MyChart messaging for urgent concerns.    DIET:  We do recommend a small meal at first, but then you may proceed to your regular diet.  Drink plenty of fluids but you should avoid alcoholic beverages for 24 hours.  ACTIVITY:  You should plan to take it easy for  the rest of today and you should NOT DRIVE or use heavy machinery until tomorrow (because of the sedation medicines used during the test).    FOLLOW UP: Our staff will call the number listed on your records the next business day following your procedure.  We will call around 7:15- 8:00 am to check on you and address any questions or concerns that you may have regarding the information given to you following your procedure. If we do not reach you, we will leave a message.     If any biopsies were taken you will be contacted by phone or by letter within the next 1-3 weeks.  Please call us at [phone] if you have not heard about the biopsies in 3 weeks.    SIGNATURES/CONFIDENTIALITY: You and/or your care partner have signed paperwork which will be entered into your electronic medical record.  These signatures attest to the fact that that the information above on your After Visit Summary has been reviewed and is understood.  Full responsibility of the confidentiality of this discharge information lies with you and/or your care-partner. 

## 2022-06-30 NOTE — Progress Notes (Signed)
Report to PACU, RN, vss, BBS= Clear.  

## 2022-06-30 NOTE — Progress Notes (Signed)
Called to room to assist during endoscopic procedure.  Patient ID and intended procedure confirmed with present staff. Received instructions for my participation in the procedure from the performing physician.  

## 2022-06-30 NOTE — Op Note (Signed)
[facility] Patient Name: Mike Farley Procedure Date: 06/30/2022 10:36 AM MRN: [id number] Endoscopist: Jerene Bears , MD, [id number] Age: 72 Referring MD:  Date of Birth: 1951-03-17 Gender: Male Account #: [id number] Procedure:                Colonoscopy Indications:              High risk colon cancer surveillance: Personal                            history of colonic polyps, Last colonoscopy 5 years                            ago Medicines:                Monitored Anesthesia Care Procedure:                Pre-Anesthesia Assessment:                           - Prior to the procedure, a History and Physical                            was performed, and patient medications and                            allergies were reviewed. The patient's tolerance of                            previous anesthesia was also reviewed. The risks                            and benefits of the procedure and the sedation                            options and risks were discussed with the patient.                            All questions were answered, and informed consent                            was obtained. Prior Anticoagulants: The patient has                            taken no anticoagulant or antiplatelet agents. ASA                            Grade Assessment: II - A patient with mild systemic                            disease. After reviewing the risks and benefits,                            the patient was deemed in satisfactory condition to  undergo the procedure.                           After obtaining informed consent, the colonoscope                            was passed under direct vision. Throughout the                            procedure, the patient's blood pressure, pulse, and                            oxygen saturations were monitored continuously. The                            CF [id number] #[id number] was introduced through the anus                             and advanced to the cecum, identified by                            appendiceal orifice and ileocecal valve. The                            colonoscopy was performed without difficulty. The                            patient tolerated the procedure well. The quality                            of the bowel preparation was good. The ileocecal                            valve, appendiceal orifice, and rectum were                            photographed. Scope In: 10:51:31 AM Scope Out: 78:58:85 AM Scope Withdrawal Time: 0 hours 14 minutes 7 seconds  Total Procedure Duration: 0 hours 19 minutes 33 seconds  Findings:                 The digital rectal exam was normal.                           A 2 mm polyp was found in the ascending colon. The                            polyp was sessile. The polyp was removed with a                            cold biopsy forceps. Resection and retrieval were                            complete.  A 6 mm polyp was found in the distal ascending                            colon. The polyp was sessile. The polyp was removed                            with a cold snare. Resection and retrieval were                            complete.                           A few small-mouthed diverticula were found in the                            sigmoid colon and ascending colon.                           The retroflexed view of the distal rectum and anal                            verge was normal and showed no anal or rectal                            abnormalities. Complications:            No immediate complications. Estimated Blood Loss:     Estimated blood loss was minimal. Impression:               - One 2 mm polyp in the ascending colon, removed                            with a cold biopsy forceps. Resected and retrieved.                           - One 6 mm polyp in the distal ascending colon,                             removed with a cold snare. Resected and retrieved.                           - Mild diverticulosis in the sigmoid colon and in                            the ascending colon. Recommendation:           - Patient has a contact number available for                            emergencies. The signs and symptoms of potential                            delayed complications were discussed with the  patient. Return to normal activities tomorrow.                            Written discharge instructions were provided to the                            patient.                           - Resume previous diet.                           - Continue present medications.                           - Await pathology results.                           - Repeat colonoscopy is recommended for                            surveillance. The colonoscopy date will be                            determined after pathology results from today's                            exam become available for review. Jerene Bears, MD 06/30/2022 11:14:30 AM This report has been signed electronically.

## 2022-07-01 ENCOUNTER — Telehealth: Payer: Self-pay

## 2022-07-01 DIAGNOSIS — C61 Malignant neoplasm of prostate: Secondary | ICD-10-CM | POA: Diagnosis not present

## 2022-07-01 NOTE — Telephone Encounter (Signed)
  Follow up Call-     06/30/2022   10:28 AM  Call back number  Post procedure Call Back phone  # [phone]  Permission to leave phone message Yes     Patient questions:  Do you have a fever, pain , or abdominal swelling? No. Pain Score  0 *  Have you tolerated food without any problems? Yes.    Have you been able to return to your normal activities? Yes.    Do you have any questions about your discharge instructions: Diet   No. Medications  No. Follow up visit  No.  Do you have questions or concerns about your Care? No.  Actions: * If pain score is 4 or above: No action needed, pain <4.

## 2022-07-06 ENCOUNTER — Encounter: Payer: Self-pay | Admitting: Internal Medicine

## 2022-07-07 DIAGNOSIS — G894 Chronic pain syndrome: Secondary | ICD-10-CM | POA: Diagnosis not present

## 2022-07-07 DIAGNOSIS — M47812 Spondylosis without myelopathy or radiculopathy, cervical region: Secondary | ICD-10-CM | POA: Diagnosis not present

## 2022-07-07 DIAGNOSIS — R69 Illness, unspecified: Secondary | ICD-10-CM | POA: Diagnosis not present

## 2022-07-07 DIAGNOSIS — M4802 Spinal stenosis, cervical region: Secondary | ICD-10-CM | POA: Diagnosis not present

## 2022-07-10 DIAGNOSIS — J309 Allergic rhinitis, unspecified: Secondary | ICD-10-CM | POA: Diagnosis not present

## 2022-07-10 DIAGNOSIS — Z87891 Personal history of nicotine dependence: Secondary | ICD-10-CM | POA: Diagnosis not present

## 2022-07-10 DIAGNOSIS — K5900 Constipation, unspecified: Secondary | ICD-10-CM | POA: Diagnosis not present

## 2022-07-10 DIAGNOSIS — N400 Enlarged prostate without lower urinary tract symptoms: Secondary | ICD-10-CM | POA: Diagnosis not present

## 2022-07-10 DIAGNOSIS — R030 Elevated blood-pressure reading, without diagnosis of hypertension: Secondary | ICD-10-CM | POA: Diagnosis not present

## 2022-07-10 DIAGNOSIS — Z79891 Long term (current) use of opiate analgesic: Secondary | ICD-10-CM | POA: Diagnosis not present

## 2022-07-10 DIAGNOSIS — N529 Male erectile dysfunction, unspecified: Secondary | ICD-10-CM | POA: Diagnosis not present

## 2022-07-10 DIAGNOSIS — K2270 Barrett's esophagus without dysplasia: Secondary | ICD-10-CM | POA: Diagnosis not present

## 2022-07-10 DIAGNOSIS — R69 Illness, unspecified: Secondary | ICD-10-CM | POA: Diagnosis not present

## 2022-07-10 DIAGNOSIS — E785 Hyperlipidemia, unspecified: Secondary | ICD-10-CM | POA: Diagnosis not present

## 2022-07-10 DIAGNOSIS — M1990 Unspecified osteoarthritis, unspecified site: Secondary | ICD-10-CM | POA: Diagnosis not present

## 2022-07-10 DIAGNOSIS — K219 Gastro-esophageal reflux disease without esophagitis: Secondary | ICD-10-CM | POA: Diagnosis not present

## 2022-07-14 ENCOUNTER — Encounter: Payer: Self-pay | Admitting: Internal Medicine

## 2022-07-14 ENCOUNTER — Ambulatory Visit (INDEPENDENT_AMBULATORY_CARE_PROVIDER_SITE_OTHER): Payer: Medicare HMO | Admitting: Internal Medicine

## 2022-07-14 VITALS — BP 122/70 | HR 90 | Temp 98.0°F | Resp 16 | Ht 70.0 in | Wt 169.6 lb

## 2022-07-14 DIAGNOSIS — Z0000 Encounter for general adult medical examination without abnormal findings: Secondary | ICD-10-CM | POA: Diagnosis not present

## 2022-07-14 DIAGNOSIS — R739 Hyperglycemia, unspecified: Secondary | ICD-10-CM

## 2022-07-14 DIAGNOSIS — D649 Anemia, unspecified: Secondary | ICD-10-CM

## 2022-07-14 DIAGNOSIS — G479 Sleep disorder, unspecified: Secondary | ICD-10-CM

## 2022-07-14 DIAGNOSIS — I700 Atherosclerosis of aorta: Secondary | ICD-10-CM

## 2022-07-14 DIAGNOSIS — C61 Malignant neoplasm of prostate: Secondary | ICD-10-CM | POA: Diagnosis not present

## 2022-07-14 DIAGNOSIS — E7800 Pure hypercholesterolemia, unspecified: Secondary | ICD-10-CM | POA: Diagnosis not present

## 2022-07-14 DIAGNOSIS — L299 Pruritus, unspecified: Secondary | ICD-10-CM | POA: Diagnosis not present

## 2022-07-14 DIAGNOSIS — R110 Nausea: Secondary | ICD-10-CM

## 2022-07-14 LAB — TSH: TSH: 3.61 u[IU]/mL (ref 0.35–5.50)

## 2022-07-14 LAB — HEMOGLOBIN A1C: Hgb A1c MFr Bld: 5.7 % (ref 4.6–6.5)

## 2022-07-14 MED ORDER — HYDROXYZINE HCL 10 MG PO TABS: 10.0000 mg | ORAL_TABLET | Freq: Two times a day (BID) | ORAL | 0 refills | Status: DC | PRN

## 2022-07-14 NOTE — Assessment & Plan Note (Signed)
Physical today 07/14/22.  Colonoscopy 06/30/22.  PSA - being followed by oncology.

## 2022-07-14 NOTE — Progress Notes (Signed)
Subjective:    Patient ID: Mike Farley, male    DOB: [birth year], 72 y.o.   MRN: [id number]  Patient here for  Chief Complaint  Patient presents with   Annual Exam    HPI Here for his physical exam. Just saw oncology 07/01/22 - f/u prostate cancer.  Recommended :  Nanokife therapy with Dr Leida Lauth.  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  This has created increased stress.  Discussed.  Not sleeping well.  Increased itching.  Needs something to help him relax and sleep.  Seeing Dr Hilarie Fredrickson.  Colonosocpy 06/30/22 - One 2 mm polyp in the ascending colon. One 6 mm polyp in the distal ascending colon. Mild diverticulosis in the sigmoid colon and in the ascending colon. Previously seen 06/15/22 - persistent nausea.  Decreased oxycodone dose.  Recommended trial of reglan. Also recommended resuming PPI - protonix '20mg'$ .  He is off reglan now.  Is taking protonix.  No chest pain reported.  No abdominal pain.     Past Medical History:  Diagnosis Date   Anxiety    Arthritis    shoulder   Cancer ([place])    prostate ca - Md just watching   Cervical pain (neck)    limited mobility   Chronic pain    Decreased ROM of neck    Depression    years ago (minor)   Diverticulosis    Duodenal diverticulum    Elevated PSA    Hemorrhoids    Hiatal hernia    History of chicken pox    HLD (hyperlipidemia)    Hx of cold sores    Internal hemorrhoids    Mild cognitive impairment with memory loss    Neuropathy    Past Surgical History:  Procedure Laterality Date   ABDOMINAL EXPOSURE N/A 04/23/2021   Procedure: ABDOMINAL EXPOSURE;  Surgeon: [name], MD;  Location: [facility];  Service: Vascular;  Laterality: N/A;   [place]     4-5 levels; has had 3-4 surgeries for this   ANTERIOR LUMBAR FUSION N/A 04/23/2021   Procedure: Lumbar four-five,  Lumbar five-Sacral one Anterior Lumbar Interbody Fusion;  Surgeon: Dawley, Theodoro Doing, DO;  Location: [facility];  Service: Neurosurgery;   Laterality: N/A;   APPENDECTOMY     BACK SURGERY     BONE EXCISION Left 09/28/2017   Procedure: BONE EXCISION-TAILORS  EXOSTECTOMY;  Surgeon: Samara Deist, DPM;  Location: [facility];  Service: Podiatry;  Laterality: Left;  IVA LOCAL   CERVICAL DISCECTOMY  2008   C2-C7 Fused    CHOLECYSTECTOMY     COLONOSCOPY  2020   COLONOSCOPY  2019   [facility] Due 03/2022 per patient   ELBOW FRACTURE SURGERY Left 1990   ESOPHAGOGASTRODUODENOSCOPY (EGD) WITH PROPOFOL N/A 10/06/2021   Procedure: ESOPHAGOGASTRODUODENOSCOPY (EGD) WITH PROPOFOL;  Surgeon: Lesly Rubenstein, MD;  Location: [facility];  Service: Endoscopy;  Laterality: N/A;   LUMBAR PERCUTANEOUS PEDICLE SCREW 2 LEVEL N/A 04/23/2021   Procedure: LUMBAR PERCUTANEOUS PEDICLE SCREW LUMBAR FOUR-SACRAL ONE;  Surgeon: Karsten Ro, DO;  Location: [place];  Service: Neurosurgery;  Laterality: N/A;   METATARSAL HEAD EXCISION Left 07/04/2020   Procedure: METATARSAL HEAD EXCISION;  Surgeon: Samara Deist, DPM;  Location: [facility];  Service: Podiatry;  Laterality: Left;   PROSTATE BIOPSY N/A 03/15/2019   Procedure: PROSTATE BIOPSY Uro Nav;  Surgeon: Royston Cowper, MD;  Location: [facility];  Service: Urology;  Laterality: N/A;  RADIOLOGY WITH ANESTHESIA N/A 04/22/2020   Procedure: MRI WITH ANESTHESIA BRAIN WITHOUT CONTRAST;  Surgeon: Radiologist, Medication, MD;  Location: [place] [facility];  Service: Radiology;  Laterality: N/A;   REPLACEMENT TOTAL KNEE Right    right knee   ROTATOR CUFF REPAIR Right 2018   with arthroscopy and distal claviculectomy   TONSILLECTOMY AND ADENOIDECTOMY     TOTAL KNEE ARTHROPLASTY Right 2005   x 4   Family History  Problem Relation Age of Onset   Arthritis Mother    Breast cancer Mother    Mental illness Mother    Colon cancer Mother    Bipolar disorder Mother    Depression Mother    Colon polyps Mother    Lung cancer Father    Prostate cancer Father    Alcohol abuse  Brother    Kidney disease Brother    Esophageal cancer Neg Hx    Social History   Socioeconomic History   Marital status: Married    Spouse name: Not on file   Number of children: Not on file   Years of education: Not on file   Highest education level: Not on file  Occupational History   Not on file  Tobacco Use   Smoking status: Former   Smokeless tobacco: Former    Types: Chew   Tobacco comments:    Quit 50 yrs ago for smoking and quit chewing 2 months ago  Vaping Use   Vaping Use: Never used  Substance and Sexual Activity   Alcohol use: Yes    Alcohol/week: 7.0 standard drinks of alcohol    Types: 7 Standard drinks or equivalent per week    Comment: beer/wine   Drug use: Never   Sexual activity: Not Currently  Other Topics Concern   Not on file  Social History Narrative   Not on file   Social Determinants of Health   Financial Resource Strain: Low Risk  (11/19/2021)   Overall Financial Resource Strain (CARDIA)    Difficulty of Paying Living Expenses: Not hard at all  Food Insecurity: No Food Insecurity (11/19/2021)   Hunger Vital Sign    Worried About Running Out of Food in the Last Year: Never true    Ran Out of Food in the Last Year: Never true  Transportation Needs: No Transportation Needs (11/19/2021)   PRAPARE - Hydrologist (Medical): No    Lack of Transportation (Non-Medical): No  Physical Activity: Sufficiently Active (11/19/2021)   Exercise Vital Sign    Days of Exercise per Week: 5 days    Minutes of Exercise per Session: 30 min  Stress: No Stress Concern Present (11/19/2021)   [place]    Feeling of Stress : Not at all  Social Connections: Unknown (11/19/2021)   Social Connection and Isolation Panel [NHANES]    Frequency of Communication with Friends and Family: Not on file    Frequency of Social Gatherings with Friends and Family: Not on file    Attends  Religious Services: Not on file    Active Member of Clubs or Organizations: Not on file    Attends Archivist Meetings: Not on file    Marital Status: Married     Review of Systems  Constitutional:  Negative for appetite change and unexpected weight change.  HENT:  Negative for congestion, sinus pressure and sore throat.   Eyes:  Negative for pain and visual disturbance.  Respiratory:  Negative  for cough, chest tightness and shortness of breath.   Cardiovascular:  Negative for chest pain and palpitations.  Gastrointestinal:  Negative for abdominal pain, diarrhea and vomiting.  Genitourinary:  Negative for difficulty urinating and dysuria.  Musculoskeletal:  Negative for joint swelling and myalgias.  Skin:  Negative for color change and rash.  Neurological:  Positive for speech difficulty. Negative for dizziness and headaches.  Hematological:  Negative for adenopathy. Does not bruise/bleed easily.  Psychiatric/Behavioral:  Negative for decreased concentration and dysphoric mood.        Increased stress as outlined.        Objective:     BP 122/70   Pulse 90   Temp 98 F (36.7 C)   Resp 16   Ht '5\' 10"'$  (1.778 m)   Wt 169 lb 9.6 oz (76.9 kg)   SpO2 98%   BMI 24.34 kg/m  Wt Readings from Last 3 Encounters:  07/14/22 169 lb 9.6 oz (76.9 kg)  06/30/22 177 lb (80.3 kg)  06/15/22 177 lb (80.3 kg)    Physical Exam Vitals reviewed.  Constitutional:      General: He is not in acute distress.    Appearance: Normal appearance. He is well-developed.  HENT:     Head: Normocephalic and atraumatic.     Right Ear: External ear normal.     Left Ear: External ear normal.  Eyes:     General: No scleral icterus.       Right eye: No discharge.        Left eye: No discharge.     Conjunctiva/sclera: Conjunctivae normal.  Neck:     Thyroid: No thyromegaly.  Cardiovascular:     Rate and Rhythm: Normal rate and regular rhythm.  Pulmonary:     Effort: Pulmonary effort is  normal. No respiratory distress.     Breath sounds: Normal breath sounds. No wheezing.  Abdominal:     General: Bowel sounds are normal.     Palpations: Abdomen is soft.     Tenderness: There is no abdominal tenderness.  Musculoskeletal:        General: No swelling or tenderness.     Cervical back: Neck supple. No tenderness.  Lymphadenopathy:     Cervical: No cervical adenopathy.  Skin:    Findings: No erythema or rash.  Neurological:     Mental Status: He is alert and oriented to person, place, and time.  Psychiatric:        Mood and Affect: Mood normal.        Behavior: Behavior normal.      Outpatient Encounter Medications as of 07/14/2022  Medication Sig   hydrOXYzine (ATARAX) 10 MG tablet Take 1 tablet (10 mg total) by mouth 2 (two) times daily as needed.   cholecalciferol (VITAMIN D3) 25 MCG (1000 UT) tablet Take 1,000 Units by mouth 3 (three) times a week.    cyanocobalamin 1000 MCG tablet Take 1,000 mcg by mouth daily.    donepezil (ARICEPT) 5 MG tablet Take 5 mg by mouth daily.   Melatonin 1 MG CAPS Take by mouth.   metoCLOPramide (REGLAN) 5 MG tablet Take 1 tablet (5 mg total) by mouth every 6 (six) hours as needed for nausea.   Multiple Vitamin (MULTIVITAMIN) tablet Take 1 tablet by mouth daily. ABC Complete MEN   Oxycodone HCl 10 MG TABS 10 mg 4 (four) times daily   pantoprazole (PROTONIX) 20 MG tablet Take 1 tablet (20 mg total) by mouth daily.   QUEtiapine (  SEROQUEL) 300 MG tablet TAKE 1/2 TO 1 TABLET BY MOUTH AT BEDTIME   rosuvastatin (CRESTOR) 10 MG tablet TAKE ONE TABLET (10 MG) BY MOUTH EVERY DAY   tadalafil (CIALIS) 5 MG tablet Take 5 mg by mouth daily.   tamsulosin (FLOMAX) 0.4 MG CAPS capsule Take 0.4 mg by mouth.   No facility-administered encounter medications on file as of 07/14/2022.     Lab Results  Component Value Date   WBC 9.8 03/11/2022   HGB 13.0 03/11/2022   HCT 39.0 03/11/2022   PLT 216.0 03/11/2022   GLUCOSE 92 07/14/2022   CHOL 179  07/14/2022   TRIG 75.0 07/14/2022   HDL 82.20 07/14/2022   LDLCALC 82 07/14/2022   ALT 16 07/14/2022   AST 26 07/14/2022   NA 140 07/14/2022   K 4.2 07/14/2022   CL 99 07/14/2022   CREATININE 0.96 07/14/2022   BUN 21 07/14/2022   CO2 29 07/14/2022   TSH 3.61 07/14/2022   PSA 9.28 (H) 03/31/2021   INR 1.03 05/29/2009   HGBA1C 5.7 07/14/2022    NM GASTRIC EMPTYING  Result Date: 11/13/2021 CLINICAL DATA:  Chronic nausea and vomiting. EXAM: NUCLEAR MEDICINE GASTRIC EMPTYING SCAN TECHNIQUE: After oral ingestion of radiolabeled meal, sequential abdominal images were obtained for 4 hours. Percentage of activity emptying the stomach was calculated at 1 hour, 2 hour, 3 hour, and 4 hours. RADIOPHARMACEUTICALS:  2.16 mCi Tc-63msulfur colloid in standardized meal COMPARISON:  CT August 26, 2021 FINDINGS: Expected location of the stomach in the left upper quadrant. Ingested meal empties the stomach gradually over the course of the study. 60% emptied at 1 hr ( normal >= 10%) 97% emptied at 2 hr ( normal >= 40%) 98% emptied at 3 hr ( normal >= 70%) 99% emptied at 4 hr ( normal >= 90%) IMPRESSION: No scintigraphic evidence of delayed gastric emptying. Electronically Signed   By: JDahlia BailiffM.D.   On: 11/13/2021 15:17       Assessment & Plan:  Health care maintenance Assessment & Plan: Physical today 07/14/22.  Colonoscopy 06/30/22.  PSA - being followed by oncology.    Hyperglycemia Assessment & Plan: Low carb diet and exercise.  Follow met b and a1c.   Orders: -     Hemoglobin A1c  Hypercholesteremia Assessment & Plan: Continue crestor.  Low cholesterol diet and exercise.  Follow lipid panel and liver function tests.   Orders: -     Lipid panel -     Hepatic function panel -     Basic metabolic panel  Itching Assessment & Plan: Increased itching.  Increased stress.  Not sleeping.  Trial of hydroxyzine to help with itching and can use in pm to help with sleep (relax).  Follow.    Orders: -     TSH  Anemia, unspecified type Assessment & Plan: Follow cbc.    [place] Assessment & Plan: Continue crestor.     Nausea Assessment & Plan:  Back on protonix.  Off reglan.  Appears to be doing better.    Prostate cancer ([facility] Assessment & Plan:  Just saw oncology 07/01/22 - f/u prostate cancer.  Recommended :  Nanokife therapy with Dr MLeida Lauth  There is an [employer] secretary  This has created increased stress.  Discussed.    Sleep difficulties Assessment & Plan: Trial of hydroxyzine as directed.  Hopefully will help with sleep and with itching.  Follow.     Other orders -  hydrOXYzine HCl; Take 1 tablet (10 mg total) by mouth 2 (two) times daily as needed.  Dispense: 30 tablet; Refill: 0     Einar Pheasant, MD

## 2022-07-15 LAB — LIPID PANEL
Cholesterol: 179 mg/dL (ref 0–200)
HDL: 82.2 mg/dL (ref >39.00)
LDL Cholesterol: 82 mg/dL (ref 0–99)
NonHDL: 96.83
Total CHOL/HDL Ratio: 2
Triglycerides: 75 mg/dL (ref 0.0–149.0)
VLDL: 15 mg/dL (ref 0.0–40.0)

## 2022-07-15 LAB — BASIC METABOLIC PANEL
BUN: 21 mg/dL (ref 6–23)
CO2: 29 mEq/L (ref 19–32)
Calcium: 9.9 mg/dL (ref 8.4–10.5)
Chloride: 99 mEq/L (ref 96–112)
Creatinine, Ser: 0.96 mg/dL (ref 0.40–1.50)
GFR: 79.18 mL/min (ref >60.00)
Glucose, Bld: 92 mg/dL (ref 70–99)
Potassium: 4.2 mEq/L (ref 3.5–5.1)
Sodium: 140 mEq/L (ref 135–145)

## 2022-07-15 LAB — HEPATIC FUNCTION PANEL
ALT: 16 U/L (ref 0–53)
AST: 26 U/L (ref 0–37)
Albumin: 4.8 g/dL (ref 3.5–5.2)
Alkaline Phosphatase: 107 U/L (ref 39–117)
Bilirubin, Direct: 0.1 mg/dL (ref 0.0–0.3)
Total Bilirubin: 0.6 mg/dL (ref 0.2–1.2)
Total Protein: 7.2 g/dL (ref 6.0–8.3)

## 2022-07-20 ENCOUNTER — Encounter: Payer: Self-pay | Admitting: Internal Medicine

## 2022-07-20 NOTE — Assessment & Plan Note (Signed)
Just saw oncology 07/01/22 - f/u prostate cancer.  Recommended :  Nanokife therapy with Dr Leida Lauth.  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  This has created increased stress.  Discussed.

## 2022-07-20 NOTE — Assessment & Plan Note (Signed)
Trial of hydroxyzine as directed.  Hopefully will help with sleep and with itching.  Follow.

## 2022-07-20 NOTE — Assessment & Plan Note (Signed)
Continue crestor 

## 2022-07-20 NOTE — Assessment & Plan Note (Signed)
Low carb diet and exercise.  Follow met b and a1c.   

## 2022-07-20 NOTE — Assessment & Plan Note (Signed)
Continue crestor.  Low cholesterol diet and exercise.  Follow lipid panel and liver function tests.  

## 2022-07-20 NOTE — Assessment & Plan Note (Signed)
Increased itching.  Increased stress.  Not sleeping.  Trial of hydroxyzine to help with itching and can use in pm to help with sleep (relax).  Follow.

## 2022-07-20 NOTE — Assessment & Plan Note (Signed)
Follow cbc.  

## 2022-07-20 NOTE — Assessment & Plan Note (Signed)
Back on protonix.  Off reglan.  Appears to be doing better.

## 2022-08-02 ENCOUNTER — Telehealth: Payer: Self-pay

## 2022-08-02 NOTE — Telephone Encounter (Signed)
LM FOR PT TO CB RE ::   I reviewed your recent lab results and your cholesterol levels are ok.  Your overall sugar control is stable.  Your thyroid test, kidney function tests and liver function tests are within normal limits.

## 2022-08-16 ENCOUNTER — Other Ambulatory Visit: Payer: Self-pay | Admitting: Internal Medicine

## 2022-08-16 DIAGNOSIS — H2513 Age-related nuclear cataract, bilateral: Secondary | ICD-10-CM | POA: Diagnosis not present

## 2022-08-27 DIAGNOSIS — M4802 Spinal stenosis, cervical region: Secondary | ICD-10-CM | POA: Diagnosis not present

## 2022-08-27 DIAGNOSIS — R69 Illness, unspecified: Secondary | ICD-10-CM | POA: Diagnosis not present

## 2022-08-27 DIAGNOSIS — M47812 Spondylosis without myelopathy or radiculopathy, cervical region: Secondary | ICD-10-CM | POA: Diagnosis not present

## 2022-08-27 DIAGNOSIS — G894 Chronic pain syndrome: Secondary | ICD-10-CM | POA: Diagnosis not present

## 2022-09-05 ENCOUNTER — Other Ambulatory Visit: Payer: Self-pay | Admitting: Internal Medicine

## 2022-09-05 ENCOUNTER — Encounter: Payer: Self-pay | Admitting: Internal Medicine

## 2022-09-06 MED ORDER — HYDROXYZINE HCL 10 MG PO TABS: 10.0000 mg | ORAL_TABLET | Freq: Two times a day (BID) | ORAL | 0 refills | Status: DC | PRN

## 2022-09-06 NOTE — Telephone Encounter (Signed)
Ok to refill hydroxyzine for him to use prn?

## 2022-09-07 NOTE — Telephone Encounter (Signed)
Disregard. Hydroxyzine already sent in

## 2022-09-07 NOTE — Telephone Encounter (Signed)
Reviewed medications.  It appears has already been sent in.  Please confirm.  If not, ok to refill hydroxyzine  bid prn #30 with no refills.

## 2022-09-17 DIAGNOSIS — S61210A Laceration without foreign body of right index finger without damage to nail, initial encounter: Secondary | ICD-10-CM | POA: Diagnosis not present

## 2022-09-21 DIAGNOSIS — M4721 Other spondylosis with radiculopathy, occipito-atlanto-axial region: Secondary | ICD-10-CM | POA: Diagnosis not present

## 2022-09-21 DIAGNOSIS — M5481 Occipital neuralgia: Secondary | ICD-10-CM | POA: Diagnosis not present

## 2022-09-21 DIAGNOSIS — M47812 Spondylosis without myelopathy or radiculopathy, cervical region: Secondary | ICD-10-CM | POA: Diagnosis not present

## 2022-09-21 DIAGNOSIS — M4802 Spinal stenosis, cervical region: Secondary | ICD-10-CM | POA: Diagnosis not present

## 2022-09-23 ENCOUNTER — Encounter: Payer: Self-pay | Admitting: Internal Medicine

## 2022-09-23 MED ORDER — QUETIAPINE FUMARATE 300 MG PO TABS: 150.0000 mg | ORAL_TABLET | Freq: Every day | ORAL | 0 refills | Status: DC

## 2022-09-23 NOTE — Telephone Encounter (Signed)
Need to clarify.  Does he not have his seroquel rx with him?  Has he been taking regularly?

## 2022-09-23 NOTE — Telephone Encounter (Signed)
Patient states he is following up on his MyChart message from earlier today.  I let him know that Rita Ohara, LPN, has seen the message and forwarded it to Dr. Dale Gardiner.  I let patient know that they have been seeing patients today, but I'm sure they will get back with him as soon as they can.

## 2022-09-23 NOTE — Telephone Encounter (Signed)
Rx sent in for seroquel #4 with no refills to [facility].

## 2022-09-23 NOTE — Telephone Encounter (Signed)
He needs 4 tablets sent to [facility] in [place]. He has been taking regularly but forgot his rx at home. Says he has plenty at home so just needs 4 pills to last until they go home Sunday.

## 2022-09-28 ENCOUNTER — Telehealth: Payer: Self-pay | Admitting: Internal Medicine

## 2022-09-28 DIAGNOSIS — C61 Malignant neoplasm of prostate: Secondary | ICD-10-CM

## 2022-09-28 NOTE — Telephone Encounter (Signed)
Pt called in staying that he needs to check his PSA. As per pt, he's [facility] dr. Its no longer there and he hasn't check that in about 5-6 months. Pt would like a phone call regarding this.

## 2022-09-29 ENCOUNTER — Other Ambulatory Visit (INDEPENDENT_AMBULATORY_CARE_PROVIDER_SITE_OTHER): Payer: Medicare HMO

## 2022-09-29 DIAGNOSIS — C61 Malignant neoplasm of prostate: Secondary | ICD-10-CM

## 2022-09-29 NOTE — Telephone Encounter (Signed)
I am ok to check psa.  Please make sure though he is following up with urology regarding his prostate issues.

## 2022-09-29 NOTE — Telephone Encounter (Signed)
PSA ordered and scheduled. He has an appointment with [facility] on 6/10 and is still an active patient with [facility] but wanted to have his PSA checked prior to his appt with urology.

## 2022-09-29 NOTE — Telephone Encounter (Signed)
Are you ok with checking a PSA on him?

## 2022-09-30 ENCOUNTER — Encounter: Payer: Self-pay | Admitting: Internal Medicine

## 2022-09-30 LAB — PSA, MEDICARE: PSA: 16.07 ng/ml — ABNORMAL HIGH (ref 0.10–4.00)

## 2022-10-13 ENCOUNTER — Encounter: Payer: Self-pay | Admitting: Internal Medicine

## 2022-10-14 MED ORDER — ONDANSETRON HCL 4 MG PO TABS: 4.0000 mg | ORAL_TABLET | Freq: Two times a day (BID) | ORAL | 0 refills | Status: DC | PRN

## 2022-10-15 ENCOUNTER — Telehealth: Payer: Self-pay | Admitting: Internal Medicine

## 2022-10-15 ENCOUNTER — Other Ambulatory Visit: Payer: Self-pay

## 2022-10-15 MED ORDER — METOCLOPRAMIDE HCL 10 MG PO TABS: 10.0000 mg | ORAL_TABLET | Freq: Three times a day (TID) | ORAL | 1 refills | Status: DC | PRN

## 2022-10-15 NOTE — Telephone Encounter (Signed)
Patient calling states he is experiencing sever nausea, looking for advice or recommendations to help his symptoms. Please advise

## 2022-10-15 NOTE — Telephone Encounter (Signed)
In the past zofran was not helpful for him  Try metoclopramide 10 mg TIDPRN for nausea, discontinue once nausea improves Also constipation has caused nausea for him in the past -- miralax 17g 1-2 x daily if needed to ensure good BMs

## 2022-10-15 NOTE — Telephone Encounter (Signed)
Pt states he has been having nausea for about 3 weeks. His PCP prescribed zofran 4mg  bid yesterday. He states it worked yesterday but today it has not helped at all. Pt wants to know what Dr. Rhea Belton recommends. Please advise.

## 2022-10-15 NOTE — Telephone Encounter (Signed)
Spoke with pt and he is aware script sent to pharmacy.

## 2022-10-20 DIAGNOSIS — G894 Chronic pain syndrome: Secondary | ICD-10-CM | POA: Diagnosis not present

## 2022-10-20 DIAGNOSIS — M47812 Spondylosis without myelopathy or radiculopathy, cervical region: Secondary | ICD-10-CM | POA: Diagnosis not present

## 2022-10-20 DIAGNOSIS — M4721 Other spondylosis with radiculopathy, occipito-atlanto-axial region: Secondary | ICD-10-CM | POA: Diagnosis not present

## 2022-10-20 DIAGNOSIS — M4802 Spinal stenosis, cervical region: Secondary | ICD-10-CM | POA: Diagnosis not present

## 2022-10-23 ENCOUNTER — Other Ambulatory Visit (HOSPITAL_COMMUNITY): Payer: Self-pay

## 2022-10-23 ENCOUNTER — Telehealth: Payer: Self-pay

## 2022-10-23 NOTE — Telephone Encounter (Signed)
Pharmacy Patient Advocate Encounter  Prior Authorization for hydrOXYzine HCI 10MG  TABLETS has been APPROVED by [facility] from 09/28/2022 to 05/24/2023.   PA # [id number]  Copay is $0.33  Georga Bora Rx Patient Advocate [phone]

## 2022-10-27 ENCOUNTER — Telehealth: Payer: Self-pay | Admitting: Internal Medicine

## 2022-10-27 NOTE — Telephone Encounter (Signed)
Copied from CRM [id number]. Topic: Medicare AWV >> Oct 27, 2022  3:37 PM Payton Doughty wrote: Reason for CRM: LM 10/27/2022 to schedule AWV   Verlee Rossetti; Care Guide Ambulatory Clinical Support [place] [facility] Direct Dial: [phone]

## 2022-10-29 ENCOUNTER — Other Ambulatory Visit: Payer: Self-pay | Admitting: Family Medicine

## 2022-10-29 NOTE — Telephone Encounter (Signed)
Rx ok'd for valtrex.  Please confirm he is doing ok.

## 2022-11-01 DIAGNOSIS — C61 Malignant neoplasm of prostate: Secondary | ICD-10-CM | POA: Diagnosis not present

## 2022-11-03 DIAGNOSIS — C61 Malignant neoplasm of prostate: Secondary | ICD-10-CM | POA: Diagnosis not present

## 2022-11-04 ENCOUNTER — Telehealth: Payer: Self-pay | Admitting: Internal Medicine

## 2022-11-04 DIAGNOSIS — E7800 Pure hypercholesterolemia, unspecified: Secondary | ICD-10-CM

## 2022-11-04 DIAGNOSIS — R739 Hyperglycemia, unspecified: Secondary | ICD-10-CM

## 2022-11-04 DIAGNOSIS — D649 Anemia, unspecified: Secondary | ICD-10-CM

## 2022-11-04 NOTE — Telephone Encounter (Signed)
Please place future orders for lab appt.  

## 2022-11-05 ENCOUNTER — Telehealth: Payer: Self-pay | Admitting: Radiation Oncology

## 2022-11-05 NOTE — Telephone Encounter (Signed)
Orders placed for labs

## 2022-11-05 NOTE — Telephone Encounter (Signed)
Called patient to schedule a consultation w. Dr. Kathrynn Running. Patient refused service, has decided to continue his care at [facility]. Closing referral until further notice.

## 2022-11-09 ENCOUNTER — Other Ambulatory Visit: Payer: Self-pay | Admitting: Internal Medicine

## 2022-11-11 ENCOUNTER — Other Ambulatory Visit (INDEPENDENT_AMBULATORY_CARE_PROVIDER_SITE_OTHER): Payer: Medicare HMO

## 2022-11-11 DIAGNOSIS — D649 Anemia, unspecified: Secondary | ICD-10-CM

## 2022-11-11 DIAGNOSIS — R739 Hyperglycemia, unspecified: Secondary | ICD-10-CM | POA: Diagnosis not present

## 2022-11-11 DIAGNOSIS — E7800 Pure hypercholesterolemia, unspecified: Secondary | ICD-10-CM | POA: Diagnosis not present

## 2022-11-11 LAB — VITAMIN B12: Vitamin B-12: 956 pg/mL — ABNORMAL HIGH (ref 211–911)

## 2022-11-11 LAB — CBC WITH DIFFERENTIAL/PLATELET
Basophils Absolute: 0 10*3/uL (ref 0.0–0.1)
Basophils Relative: 0.7 % (ref 0.0–3.0)
Eosinophils Absolute: 0.1 10*3/uL (ref 0.0–0.7)
Eosinophils Relative: 2.4 % (ref 0.0–5.0)
HCT: 39.2 % (ref 39.0–52.0)
Hemoglobin: 12.9 g/dL — ABNORMAL LOW (ref 13.0–17.0)
Lymphocytes Relative: 37 % (ref 12.0–46.0)
Lymphs Abs: 2 10*3/uL (ref 0.7–4.0)
MCHC: 32.9 g/dL (ref 30.0–36.0)
MCV: 93.1 fl (ref 78.0–100.0)
Monocytes Absolute: 0.6 10*3/uL (ref 0.1–1.0)
Monocytes Relative: 11.1 % (ref 3.0–12.0)
Neutro Abs: 2.6 10*3/uL (ref 1.4–7.7)
Neutrophils Relative %: 48.8 % (ref 43.0–77.0)
Platelets: 210 10*3/uL (ref 150.0–400.0)
RBC: 4.21 Mil/uL — ABNORMAL LOW (ref 4.22–5.81)
RDW: 13.8 % (ref 11.5–15.5)
WBC: 5.3 10*3/uL (ref 4.0–10.5)

## 2022-11-11 LAB — BASIC METABOLIC PANEL
BUN: 16 mg/dL (ref 6–23)
CO2: 31 mEq/L (ref 19–32)
Calcium: 9.2 mg/dL (ref 8.4–10.5)
Chloride: 103 mEq/L (ref 96–112)
Creatinine, Ser: 0.84 mg/dL (ref 0.40–1.50)
GFR: 87.16 mL/min (ref >60.00)
Glucose, Bld: 96 mg/dL (ref 70–99)
Potassium: 4.3 mEq/L (ref 3.5–5.1)
Sodium: 141 mEq/L (ref 135–145)

## 2022-11-11 LAB — LIPID PANEL
Cholesterol: 149 mg/dL (ref 0–200)
HDL: 69.7 mg/dL (ref >39.00)
LDL Cholesterol: 60 mg/dL (ref 0–99)
NonHDL: 78.87
Total CHOL/HDL Ratio: 2
Triglycerides: 93 mg/dL (ref 0.0–149.0)
VLDL: 18.6 mg/dL (ref 0.0–40.0)

## 2022-11-11 LAB — HEPATIC FUNCTION PANEL
ALT: 14 U/L (ref 0–53)
AST: 18 U/L (ref 0–37)
Albumin: 4.1 g/dL (ref 3.5–5.2)
Alkaline Phosphatase: 100 U/L (ref 39–117)
Bilirubin, Direct: 0.1 mg/dL (ref 0.0–0.3)
Total Bilirubin: 0.5 mg/dL (ref 0.2–1.2)
Total Protein: 6.8 g/dL (ref 6.0–8.3)

## 2022-11-11 LAB — HEMOGLOBIN A1C: Hgb A1c MFr Bld: 5.6 % (ref 4.6–6.5)

## 2022-11-11 LAB — IBC + FERRITIN
Ferritin: 39.5 ng/mL (ref 22.0–322.0)
Iron: 86 ug/dL (ref 42–165)
Saturation Ratios: 26.6 % (ref 20.0–50.0)
TIBC: 323.4 ug/dL (ref 250.0–450.0)
Transferrin: 231 mg/dL (ref 212.0–360.0)

## 2022-11-12 ENCOUNTER — Other Ambulatory Visit (HOSPITAL_COMMUNITY): Payer: Self-pay | Admitting: Urology

## 2022-11-12 ENCOUNTER — Ambulatory Visit: Payer: Medicare HMO | Admitting: Internal Medicine

## 2022-11-12 DIAGNOSIS — C61 Malignant neoplasm of prostate: Secondary | ICD-10-CM | POA: Diagnosis not present

## 2022-11-15 ENCOUNTER — Ambulatory Visit: Payer: Medicare HMO | Admitting: Internal Medicine

## 2022-11-16 ENCOUNTER — Ambulatory Visit: Payer: Medicare HMO | Admitting: Internal Medicine

## 2022-11-17 ENCOUNTER — Other Ambulatory Visit (HOSPITAL_COMMUNITY): Payer: Self-pay | Admitting: Urology

## 2022-11-17 DIAGNOSIS — C61 Malignant neoplasm of prostate: Secondary | ICD-10-CM

## 2022-11-30 ENCOUNTER — Encounter: Payer: Self-pay | Admitting: Internal Medicine

## 2022-11-30 ENCOUNTER — Ambulatory Visit (INDEPENDENT_AMBULATORY_CARE_PROVIDER_SITE_OTHER): Payer: Medicare HMO | Admitting: Internal Medicine

## 2022-11-30 VITALS — BP 126/78 | HR 70 | Temp 97.9°F | Resp 16 | Ht 70.0 in | Wt 169.0 lb

## 2022-11-30 DIAGNOSIS — C61 Malignant neoplasm of prostate: Secondary | ICD-10-CM | POA: Diagnosis not present

## 2022-11-30 DIAGNOSIS — E559 Vitamin D deficiency, unspecified: Secondary | ICD-10-CM

## 2022-11-30 DIAGNOSIS — G3184 Mild cognitive impairment, so stated: Secondary | ICD-10-CM | POA: Diagnosis not present

## 2022-11-30 DIAGNOSIS — I700 Atherosclerosis of aorta: Secondary | ICD-10-CM | POA: Diagnosis not present

## 2022-11-30 DIAGNOSIS — R739 Hyperglycemia, unspecified: Secondary | ICD-10-CM | POA: Diagnosis not present

## 2022-11-30 DIAGNOSIS — E7800 Pure hypercholesterolemia, unspecified: Secondary | ICD-10-CM | POA: Diagnosis not present

## 2022-11-30 DIAGNOSIS — F32A Depression, unspecified: Secondary | ICD-10-CM

## 2022-11-30 NOTE — Progress Notes (Signed)
Subjective:    Patient ID: Mike Farley, male    DOB: 1950-10-08, 72 y.o.   MRN: [id number]  Patient here for  Chief Complaint  Patient presents with   Medical Management of Chronic Issues    HPI Here to follow up regarding hypercholesterolemia, increased stress and prostate cancer.  Saw oncology - 11/12/22 - s/p lupron injection. XRT treatments. Reports fatigue.  Eating.  No nausea or vomiting reported.  No abdominal pain reported. Has seen neurology.  Diagnosed with mild cognitive impairment.  Continue aricept.    Past Medical History:  Diagnosis Date   Anxiety    Arthritis    shoulder   Cancer (HCC)    prostate ca - Md just watching   Cervical pain (neck)    limited mobility   Chronic pain    Decreased ROM of neck    Depression    years ago (minor)   Diverticulosis    Duodenal diverticulum    Elevated PSA    Hemorrhoids    Hiatal hernia    History of chicken pox    HLD (hyperlipidemia)    Hx of cold sores    Internal hemorrhoids    Mild cognitive impairment with memory loss    Neuropathy    Past Surgical History:  Procedure Laterality Date   ABDOMINAL EXPOSURE N/A 04/23/2021   Procedure: ABDOMINAL EXPOSURE;  Surgeon: Cephus Shelling, MD;  Location: [facility] OR;  Service: Vascular;  Laterality: N/A;   ANTERIOR FUSION CERVICAL SPINE     4-5 levels; has had 3-4 surgeries for this   ANTERIOR LUMBAR FUSION N/A 04/23/2021   Procedure: Lumbar four-five,  Lumbar five-Sacral one Anterior Lumbar Interbody Fusion;  Surgeon: Dawley, Alan Mulder, DO;  Location: [facility] OR;  Service: Neurosurgery;  Laterality: N/A;   APPENDECTOMY     BACK SURGERY     BONE EXCISION Left 09/28/2017   Procedure: BONE EXCISION-TAILORS  EXOSTECTOMY;  Surgeon: Gwyneth Revels, DPM;  Location: [facility];  Service: Podiatry;  Laterality: Left;  IVA LOCAL   CERVICAL DISCECTOMY  2008   C2-C7 Fused    CHOLECYSTECTOMY     COLONOSCOPY  2020   COLONOSCOPY  2019   [facility]  Clinic Due 03/2022 per patient   ELBOW FRACTURE SURGERY Left 1990   ESOPHAGOGASTRODUODENOSCOPY (EGD) WITH PROPOFOL N/A 10/06/2021   Procedure: ESOPHAGOGASTRODUODENOSCOPY (EGD) WITH PROPOFOL;  Surgeon: Regis Bill, MD;  Location: [facility];  Service: Endoscopy;  Laterality: N/A;   LUMBAR PERCUTANEOUS PEDICLE SCREW 2 LEVEL N/A 04/23/2021   Procedure: LUMBAR PERCUTANEOUS PEDICLE SCREW LUMBAR FOUR-SACRAL ONE;  Surgeon: Bethann Goo, DO;  Location: [facility] OR;  Service: Neurosurgery;  Laterality: N/A;   METATARSAL HEAD EXCISION Left 07/04/2020   Procedure: METATARSAL HEAD EXCISION;  Surgeon: Gwyneth Revels, DPM;  Location: [facility];  Service: Podiatry;  Laterality: Left;   PROSTATE BIOPSY N/A 03/15/2019   Procedure: PROSTATE BIOPSY Uro Nav;  Surgeon: Orson Ape, MD;  Location: [facility];  Service: Urology;  Laterality: N/A;   RADIOLOGY WITH ANESTHESIA N/A 04/22/2020   Procedure: MRI WITH ANESTHESIA BRAIN WITHOUT CONTRAST;  Surgeon: Radiologist, Medication, MD;  Location: [facility] OR;  Service: Radiology;  Laterality: N/A;   REPLACEMENT TOTAL KNEE Right    right knee   ROTATOR CUFF REPAIR Right 2018   with arthroscopy and distal claviculectomy   TONSILLECTOMY AND ADENOIDECTOMY     TOTAL KNEE ARTHROPLASTY Right 2005   x 4   Family History  Problem  Relation Age of Onset   Arthritis Mother    Breast cancer Mother    Mental illness Mother    Colon cancer Mother    Bipolar disorder Mother    Depression Mother    Colon polyps Mother    Lung cancer Father    Prostate cancer Father    Alcohol abuse Brother    Kidney disease Brother    Esophageal cancer Neg Hx    Social History   Socioeconomic History   Marital status: Married    Spouse name: Not on file   Number of children: Not on file   Years of education: Not on file   Highest education level: Professional school degree (e.g., MD, DDS, DVM, JD)  Occupational History   Not on file  Tobacco Use   Smoking status: Former    Smokeless tobacco: Former    Types: Chew   Tobacco comments:    Quit 50 yrs ago for smoking and quit chewing 2 months ago  Vaping Use   Vaping status: Never Used  Substance and Sexual Activity   Alcohol use: Yes    Alcohol/week: 7.0 standard drinks of alcohol    Types: 7 Standard drinks or equivalent per week    Comment: beer/wine   Drug use: Never   Sexual activity: Not Currently  Other Topics Concern   Not on file  Social History Narrative   Not on file   Social Determinants of Health   Financial Resource Strain: Low Risk  (11/19/2021)   Overall Financial Resource Strain (CARDIA)    Difficulty of Paying Living Expenses: Not hard at all  Food Insecurity: No Food Insecurity (11/23/2022)   Hunger Vital Sign    Worried About Running Out of Food in the Last Year: Never true    Ran Out of Food in the Last Year: Never true  Transportation Needs: No Transportation Needs (11/23/2022)   PRAPARE - Administrator, Civil Service (Medical): No    Lack of Transportation (Non-Medical): No  Physical Activity: Unknown (11/23/2022)   Exercise Vital Sign    Days of Exercise per Week: 4 days    Minutes of Exercise per Session: Not on file  Stress: Stress Concern Present (11/23/2022)   [employer] of Occupational Health - Occupational Stress Questionnaire    Feeling of Stress : To some extent  Social Connections: Unknown (11/23/2022)   Social Connection and Isolation Panel [NHANES]    Frequency of Communication with Friends and Family: More than three times a week    Frequency of Social Gatherings with Friends and Family: Twice a week    Attends Religious Services: More than 4 times per year    Active Member of [employer] or Organizations: Not on file    Attends Banker Meetings: Not on file    Marital Status: Married     Review of Systems  Constitutional:  Positive for fatigue. Negative for appetite change and unexpected weight change.  HENT:  Negative for congestion and  sinus pressure.   Respiratory:  Negative for cough, chest tightness and shortness of breath.   Cardiovascular:  Negative for chest pain and palpitations.  Gastrointestinal:  Negative for abdominal pain, diarrhea, nausea and vomiting.  Genitourinary:  Negative for difficulty urinating and dysuria.  Musculoskeletal:  Negative for joint swelling and myalgias.  Skin:  Negative for color change and rash.  Neurological:  Negative for dizziness and headaches.  Psychiatric/Behavioral:  Negative for agitation.  Increased stress as outlined.        Objective:     BP 126/78   Pulse 70   Temp 97.9 F (36.6 C)   Resp 16   Ht 5\' 10"  (1.778 m)   Wt 169 lb (76.7 kg)   SpO2 98%   BMI 24.25 kg/m  Wt Readings from Last 3 Encounters:  11/30/22 169 lb (76.7 kg)  07/14/22 169 lb 9.6 oz (76.9 kg)  06/30/22 177 lb (80.3 kg)    Physical Exam Constitutional:      General: He is not in acute distress.    Appearance: Normal appearance. He is well-developed.  HENT:     Head: Normocephalic and atraumatic.     Right Ear: External ear normal.     Left Ear: External ear normal.  Eyes:     General: No scleral icterus.       Right eye: No discharge.        Left eye: No discharge.  Cardiovascular:     Rate and Rhythm: Normal rate and regular rhythm.  Pulmonary:     Effort: Pulmonary effort is normal. No respiratory distress.     Breath sounds: Normal breath sounds.  Abdominal:     General: Bowel sounds are normal.     Palpations: Abdomen is soft.     Tenderness: There is no abdominal tenderness.  Musculoskeletal:        General: No swelling or tenderness.     Cervical back: Neck supple. No tenderness.  Lymphadenopathy:     Cervical: No cervical adenopathy.  Skin:    Findings: No erythema or rash.  Neurological:     Mental Status: He is alert.  Psychiatric:        Mood and Affect: Mood normal.        Behavior: Behavior normal.      Outpatient Encounter Medications as of 11/30/2022   Medication Sig   cholecalciferol (VITAMIN D3) 25 MCG (1000 UT) tablet Take 1,000 Units by mouth 3 (three) times a week.    cyanocobalamin 1000 MCG tablet Take 1,000 mcg by mouth daily.    donepezil (ARICEPT) 5 MG tablet Take 5 mg by mouth daily.   hydrOXYzine (ATARAX) 10 MG tablet Take 1 tablet (10 mg total) by mouth 2 (two) times daily as needed.   Melatonin 1 MG CAPS Take by mouth.   metoCLOPramide (REGLAN) 10 MG tablet Take 1 tablet (10 mg total) by mouth every 8 (eight) hours as needed for nausea.   metoCLOPramide (REGLAN) 5 MG tablet Take 1 tablet (5 mg total) by mouth every 6 (six) hours as needed for nausea.   Multiple Vitamin (MULTIVITAMIN) tablet Take 1 tablet by mouth daily. ABC Complete MEN   ondansetron (ZOFRAN) 4 MG tablet Take 1 tablet (4 mg total) by mouth 2 (two) times daily as needed for nausea or vomiting.   Oxycodone HCl 10 MG TABS 10 mg 4 (four) times daily   pantoprazole (PROTONIX) 20 MG tablet Take 1 tablet (20 mg total) by mouth daily.   QUEtiapine (SEROQUEL) 300 MG tablet TAKE 1/2 TO 1 TABLET AT BEDTIME   rosuvastatin (CRESTOR) 10 MG tablet TAKE 1 TABLET BY MOUTH DAILY   tadalafil (CIALIS) 5 MG tablet Take 5 mg by mouth daily.   tamsulosin (FLOMAX) 0.4 MG CAPS capsule Take 0.4 mg by mouth.   valACYclovir (VALTREX) 1000 MG tablet TAKE ONE (1) TABLET BY MOUTH TWO TIMES PER DAY. TAKE FOR ONE DAY AT ONSET OF BREAKOUT.  No facility-administered encounter medications on file as of 11/30/2022.     Lab Results  Component Value Date   WBC 5.3 11/11/2022   HGB 12.9 (L) 11/11/2022   HCT 39.2 11/11/2022   PLT 210.0 11/11/2022   GLUCOSE 96 11/11/2022   CHOL 149 11/11/2022   TRIG 93.0 11/11/2022   HDL 69.70 11/11/2022   LDLCALC 60 11/11/2022   ALT 14 11/11/2022   AST 18 11/11/2022   NA 141 11/11/2022   K 4.3 11/11/2022   CL 103 11/11/2022   CREATININE 0.84 11/11/2022   BUN 16 11/11/2022   CO2 31 11/11/2022   TSH 3.61 07/14/2022   PSA 16.07 (H) 09/29/2022   INR  1.03 05/29/2009   HGBA1C 5.6 11/11/2022    NM GASTRIC EMPTYING  Result Date: 11/13/2021 CLINICAL DATA:  Chronic nausea and vomiting. EXAM: NUCLEAR MEDICINE GASTRIC EMPTYING SCAN TECHNIQUE: After oral ingestion of radiolabeled meal, sequential abdominal images were obtained for 4 hours. Percentage of activity emptying the stomach was calculated at 1 hour, 2 hour, 3 hour, and 4 hours. RADIOPHARMACEUTICALS:  2.16 mCi Tc-25m sulfur colloid in standardized meal COMPARISON:  CT August 26, 2021 FINDINGS: Expected location of the stomach in the left upper quadrant. Ingested meal empties the stomach gradually over the course of the study. 60% emptied at 1 hr ( normal >= 10%) 97% emptied at 2 hr ( normal >= 40%) 98% emptied at 3 hr ( normal >= 70%) 99% emptied at 4 hr ( normal >= 90%) IMPRESSION: No scintigraphic evidence of delayed gastric emptying. Electronically Signed   By: Maudry Mayhew M.D.   On: 11/13/2021 15:17       Assessment & Plan:  Prostate cancer [facility]) Assessment & Plan: Seeing oncology.  Lupron injection and XRT.  Some fatigue.  Continue f/u with oncology.    Mild cognitive impairment Assessment & Plan: Mild cognitive impairment.  Saw neurology.  Continue aricept.  Follow.    Hyperglycemia Assessment & Plan: Low carb diet and exercise.  Follow met b and a1c.    Hypercholesteremia Assessment & Plan: Continue crestor.  Low cholesterol diet and exercise.  Follow lipid panel and liver function tests.    Aortic atherosclerosis (HCC) Assessment & Plan: Continue crestor.     Mild depression Assessment & Plan: Continues seroquel.  Has good support.  Follow.    Vitamin D insufficiency Assessment & Plan: Continue vitamin D supplements.       [place], MD

## 2022-12-04 ENCOUNTER — Encounter: Payer: Self-pay | Admitting: Internal Medicine

## 2022-12-04 NOTE — Assessment & Plan Note (Signed)
Low carb diet and exercise.  Follow met b and a1c.   

## 2022-12-04 NOTE — Assessment & Plan Note (Signed)
Continue crestor 

## 2022-12-04 NOTE — Assessment & Plan Note (Signed)
Continues seroquel.  Has good support.  Follow.

## 2022-12-04 NOTE — Assessment & Plan Note (Signed)
Seeing oncology.  Lupron injection and XRT.  Some fatigue.  Continue f/u with oncology.

## 2022-12-04 NOTE — Assessment & Plan Note (Signed)
Mild cognitive impairment.  Saw neurology.  Continue aricept.  Follow.

## 2022-12-04 NOTE — Assessment & Plan Note (Signed)
Continue vitamin D supplements.  

## 2022-12-04 NOTE — Assessment & Plan Note (Signed)
Continue crestor.  Low cholesterol diet and exercise.  Follow lipid panel and liver function tests.  

## 2022-12-06 DIAGNOSIS — Z79891 Long term (current) use of opiate analgesic: Secondary | ICD-10-CM | POA: Diagnosis not present

## 2022-12-06 DIAGNOSIS — M4721 Other spondylosis with radiculopathy, occipito-atlanto-axial region: Secondary | ICD-10-CM | POA: Diagnosis not present

## 2022-12-06 DIAGNOSIS — M4802 Spinal stenosis, cervical region: Secondary | ICD-10-CM | POA: Diagnosis not present

## 2022-12-06 DIAGNOSIS — M47812 Spondylosis without myelopathy or radiculopathy, cervical region: Secondary | ICD-10-CM | POA: Diagnosis not present

## 2022-12-06 DIAGNOSIS — G894 Chronic pain syndrome: Secondary | ICD-10-CM | POA: Diagnosis not present

## 2022-12-14 DIAGNOSIS — C61 Malignant neoplasm of prostate: Secondary | ICD-10-CM | POA: Diagnosis not present

## 2022-12-15 DIAGNOSIS — C61 Malignant neoplasm of prostate: Secondary | ICD-10-CM | POA: Diagnosis not present

## 2022-12-22 ENCOUNTER — Encounter (INDEPENDENT_AMBULATORY_CARE_PROVIDER_SITE_OTHER): Payer: Self-pay

## 2022-12-28 DIAGNOSIS — S61511A Laceration without foreign body of right wrist, initial encounter: Secondary | ICD-10-CM | POA: Diagnosis not present

## 2022-12-31 DIAGNOSIS — C61 Malignant neoplasm of prostate: Secondary | ICD-10-CM | POA: Diagnosis not present

## 2023-01-03 DIAGNOSIS — C61 Malignant neoplasm of prostate: Secondary | ICD-10-CM | POA: Diagnosis not present

## 2023-01-04 ENCOUNTER — Encounter: Payer: Self-pay | Admitting: Internal Medicine

## 2023-01-04 DIAGNOSIS — Z4802 Encounter for removal of sutures: Secondary | ICD-10-CM | POA: Diagnosis not present

## 2023-01-04 DIAGNOSIS — S61511D Laceration without foreign body of right wrist, subsequent encounter: Secondary | ICD-10-CM | POA: Diagnosis not present

## 2023-01-05 DIAGNOSIS — C61 Malignant neoplasm of prostate: Secondary | ICD-10-CM | POA: Diagnosis not present

## 2023-01-07 DIAGNOSIS — C61 Malignant neoplasm of prostate: Secondary | ICD-10-CM | POA: Diagnosis not present

## 2023-01-10 DIAGNOSIS — C61 Malignant neoplasm of prostate: Secondary | ICD-10-CM | POA: Diagnosis not present

## 2023-01-12 DIAGNOSIS — C61 Malignant neoplasm of prostate: Secondary | ICD-10-CM | POA: Diagnosis not present

## 2023-01-19 ENCOUNTER — Ambulatory Visit (INDEPENDENT_AMBULATORY_CARE_PROVIDER_SITE_OTHER): Payer: Medicare HMO | Admitting: *Deleted

## 2023-01-19 VITALS — BP 130/78 | HR 76 | Temp 97.1°F | Ht 70.0 in | Wt 166.0 lb

## 2023-01-19 DIAGNOSIS — Z0000 Encounter for general adult medical examination without abnormal findings: Secondary | ICD-10-CM | POA: Diagnosis not present

## 2023-01-19 NOTE — Progress Notes (Signed)
Subjective:   Mike Farley is a 72 y.o. male who presents for Medicare Annual/Subsequent preventive examination.  Visit Complete: In person  Patient Medicare AWV questionnaire was completed by the patient on 01/17/23; I have confirmed that all information answered by patient is correct and no changes since this date.   Vital signs completed and documented  Review of Systems     Cardiac Risk Factors include: advanced age (>30men, >38 women);dyslipidemia;male gender;Other (see comment), Risk factor comments: cardiac murmur     Objective:    Today's Vitals   01/19/23 1113 01/19/23 1117  BP: 130/78   Pulse: 76   Temp: (!) 97.1 F (36.2 C)   TempSrc: Skin   SpO2: 99%   Weight: 166 lb (75.3 kg)   Height: 5\' 10"  (1.778 m)   PainSc:  7    Body mass index is 23.82 kg/m.     01/19/2023   11:37 AM 11/19/2021   11:14 AM 10/06/2021    6:55 AM 04/24/2021    2:00 PM 04/23/2021    6:00 PM 04/21/2021    2:16 PM 11/18/2020   11:49 AM  Advanced Directives  Does Patient Have a Medical Advance Directive? Yes Yes No  Yes Yes Yes  Type of Estate agent of [place];Living will Healthcare Power of [place];Living will  Healthcare Power of [employer] of [place];Living will Healthcare Power of [place];Living will Healthcare Power of [place];Living will  Does patient want to make changes to medical advance directive?  No - Patient declined  No - Patient declined   No - Patient declined  Copy of Healthcare Power of Attorney in Chart? No - copy requested No - copy requested     No - copy requested    Current Medications (verified) Outpatient Encounter Medications as of 01/19/2023  Medication Sig   cholecalciferol (VITAMIN D3) 25 MCG (1000 UT) tablet Take 1,000 Units by mouth 3 (three) times a week.    cyanocobalamin 1000 MCG tablet Take 1,000 mcg by mouth daily.    donepezil (ARICEPT) 5 MG tablet Take 5 mg by mouth daily.   Melatonin 1 MG CAPS Take by  mouth.   Multiple Vitamin (MULTIVITAMIN) tablet Take 1 tablet by mouth daily. ABC Complete MEN   ondansetron (ZOFRAN) 4 MG tablet Take 4 mg by mouth every 8 (eight) hours as needed for nausea or vomiting.   Oxycodone HCl 10 MG TABS 10 mg 4 (four) times daily   pantoprazole (PROTONIX) 20 MG tablet Take 1 tablet (20 mg total) by mouth daily.   QUEtiapine (SEROQUEL) 300 MG tablet TAKE 1/2 TO 1 TABLET AT BEDTIME   rosuvastatin (CRESTOR) 10 MG tablet TAKE 1 TABLET BY MOUTH DAILY   tadalafil (CIALIS) 5 MG tablet Take 5 mg by mouth daily.   tamsulosin (FLOMAX) 0.4 MG CAPS capsule Take 0.4 mg by mouth.   valACYclovir (VALTREX) 1000 MG tablet TAKE ONE (1) TABLET BY MOUTH TWO TIMES PER DAY. TAKE FOR ONE DAY AT ONSET OF BREAKOUT.   [DISCONTINUED] ondansetron (ZOFRAN) 4 MG tablet Take 1 tablet (4 mg total) by mouth 2 (two) times daily as needed for nausea or vomiting.   [DISCONTINUED] hydrOXYzine (ATARAX) 10 MG tablet Take 1 tablet (10 mg total) by mouth 2 (two) times daily as needed.   [DISCONTINUED] metoCLOPramide (REGLAN) 10 MG tablet Take 1 tablet (10 mg total) by mouth every 8 (eight) hours as needed for nausea.   [DISCONTINUED] metoCLOPramide (REGLAN) 5 MG tablet Take 1 tablet (5  mg total) by mouth every 6 (six) hours as needed for nausea.   No facility-administered encounter medications on file as of 01/19/2023.    Allergies (verified) Morphine, Amoxicillin, and Fentanyl   History: Past Medical History:  Diagnosis Date   Anxiety    Arthritis    shoulder   Cancer (HCC)    prostate ca - Md just watching   Cervical pain (neck)    limited mobility   Chronic pain    Decreased ROM of neck    Depression    years ago (minor)   Diverticulosis    Duodenal diverticulum    Elevated PSA    Hemorrhoids    Hiatal hernia    History of chicken pox    HLD (hyperlipidemia)    Hx of cold sores    Internal hemorrhoids    Mild cognitive impairment with memory loss    Neuropathy    Past Surgical  History:  Procedure Laterality Date   ABDOMINAL EXPOSURE N/A 04/23/2021   Procedure: ABDOMINAL EXPOSURE;  Surgeon: Cephus Shelling, MD;  Location: [facility] OR;  Service: Vascular;  Laterality: N/A;   ANTERIOR FUSION CERVICAL SPINE     4-5 levels; has had 3-4 surgeries for this   ANTERIOR LUMBAR FUSION N/A 04/23/2021   Procedure: Lumbar four-five,  Lumbar five-Sacral one Anterior Lumbar Interbody Fusion;  Surgeon: Dawley, Alan Mulder, DO;  Location: [facility] OR;  Service: Neurosurgery;  Laterality: N/A;   APPENDECTOMY     BACK SURGERY     BACK SURGERY  04/2022   BONE EXCISION Left 09/28/2017   Procedure: BONE EXCISION-TAILORS  EXOSTECTOMY;  Surgeon: Gwyneth Revels, DPM;  Location: [facility];  Service: Podiatry;  Laterality: Left;  IVA LOCAL   CERVICAL DISCECTOMY  2008   C2-C7 Fused    CHOLECYSTECTOMY     COLONOSCOPY  2020   COLONOSCOPY  2019   [facility] Due 03/2022 per patient   ELBOW FRACTURE SURGERY Left 1990   ESOPHAGOGASTRODUODENOSCOPY (EGD) WITH PROPOFOL N/A 10/06/2021   Procedure: ESOPHAGOGASTRODUODENOSCOPY (EGD) WITH PROPOFOL;  Surgeon: Regis Bill, MD;  Location: [facility];  Service: Endoscopy;  Laterality: N/A;   LUMBAR PERCUTANEOUS PEDICLE SCREW 2 LEVEL N/A 04/23/2021   Procedure: LUMBAR PERCUTANEOUS PEDICLE SCREW LUMBAR FOUR-SACRAL ONE;  Surgeon: Bethann Goo, DO;  Location: [facility];  Service: Neurosurgery;  Laterality: N/A;   METATARSAL HEAD EXCISION Left 07/04/2020   Procedure: METATARSAL HEAD EXCISION;  Surgeon: Gwyneth Revels, DPM;  Location: [facility];  Service: Podiatry;  Laterality: Left;   PROSTATE BIOPSY N/A 03/15/2019   Procedure: PROSTATE BIOPSY Uro Nav;  Surgeon: Orson Ape, MD;  Location: [facility];  Service: Urology;  Laterality: N/A;   RADIOLOGY WITH ANESTHESIA N/A 04/22/2020   Procedure: MRI WITH ANESTHESIA BRAIN WITHOUT CONTRAST;  Surgeon: Radiologist, Medication, MD;  Location: [facility] OR;  Service: Radiology;   Laterality: N/A;   REPLACEMENT TOTAL KNEE Right    right knee   ROTATOR CUFF REPAIR Right 2018   with arthroscopy and distal claviculectomy   TONSILLECTOMY AND ADENOIDECTOMY     TOTAL KNEE ARTHROPLASTY Right 2005   x 4   Family History  Problem Relation Age of Onset   Arthritis Mother    Breast cancer Mother    Mental illness Mother    Colon cancer Mother    Bipolar disorder Mother    Depression Mother    Colon polyps Mother    Lung cancer Father    Prostate cancer  Father    Alcohol abuse Brother    Kidney disease Brother    Esophageal cancer Neg Hx    Social History   Socioeconomic History   Marital status: Married    Spouse name: Not on file   Number of children: Not on file   Years of education: Not on file   Highest education level: Professional school degree (e.g., MD, DDS, DVM, JD)  Occupational History   Not on file  Tobacco Use   Smoking status: Former   Smokeless tobacco: Former    Types: Chew   Tobacco comments:    Quit 50 yrs ago for smoking and quit chewing 2 months ago  Vaping Use   Vaping status: Never Used  Substance and Sexual Activity   Alcohol use: Yes    Alcohol/week: 7.0 standard drinks of alcohol    Types: 7 Standard drinks or equivalent per week    Comment: beer/wine   Drug use: Never   Sexual activity: Not Currently  Other Topics Concern   Not on file  Social History Narrative   Married   Social Determinants of Health   Financial Resource Strain: Low Risk  (01/17/2023)   Overall Financial Resource Strain (CARDIA)    Difficulty of Paying Living Expenses: Not hard at all  Food Insecurity: No Food Insecurity (01/17/2023)   Hunger Vital Sign    Worried About Running Out of Food in the Last Year: Never true    Ran Out of Food in the Last Year: Never true  Transportation Needs: No Transportation Needs (01/17/2023)   PRAPARE - Administrator, Civil Service (Medical): No    Lack of Transportation (Non-Medical): No  Physical  Activity: Sufficiently Active (01/17/2023)   Exercise Vital Sign    Days of Exercise per Week: 4 days    Minutes of Exercise per Session: 40 min  Stress: Stress Concern Present (01/19/2023)   [employer] of Occupational Health - Occupational Stress Questionnaire    Feeling of Stress : Rather much  Social Connections: Socially Integrated (01/17/2023)   Social Connection and Isolation Panel [NHANES]    Frequency of Communication with Friends and Family: More than three times a week    Frequency of Social Gatherings with Friends and Family: More than three times a week    Attends Religious Services: More than 4 times per year    Active Member of [employer] or Organizations: Yes    Attends Engineer, structural: More than 4 times per year    Marital Status: Married    Tobacco Counseling Counseling given: Not Answered Tobacco comments: Quit 50 yrs ago for smoking and quit chewing 2 months ago   Clinical Intake:  Pre-visit preparation completed: Yes  Pain : 0-10 Pain Score: 7  Pain Type: Chronic pain Pain Location: Neck Pain Descriptors / Indicators: Aching Pain Onset: More than a month ago Pain Frequency: Constant     BMI - recorded: 23.82 Nutritional Status: BMI of 19-24  Normal Nutritional Risks: None Diabetes: No  How often do you need to have someone help you when you read instructions, pamphlets, or other written materials from your doctor or pharmacy?: 1 - Never  Interpreter Needed?: No  Information entered by :: R. Raja Caputi LPN   Activities of Daily Living    01/19/2023   11:19 AM 01/17/2023   11:43 AM  In your present state of health, do you have any difficulty performing the following activities:  Hearing? 0 1  Vision? 0  0  Comment glasses   Difficulty concentrating or making decisions? 1 0  Comment concentrating   Walking or climbing stairs? 0 0  Dressing or bathing? 0 0  Doing errands, shopping? 0 0  Preparing Food and eating ? N N  Using the  Toilet? N N  In the past six months, have you accidently leaked urine? N N  Do you have problems with loss of bowel control? N N  Managing your Medications? N N  Managing your Finances? N N  Housekeeping or managing your Housekeeping? N N    Patient Care Team: Dale [place], MD as PCP - General (Internal Medicine)  Indicate any recent Medical Services you may have received from other than [facility] providers in the past year (date may be approximate).     Assessment:   This is a routine wellness examination for Levian.  Hearing/Vision screen Hearing Screening - Comments:: No issues Vision Screening - Comments:: glasses  Dietary issues and exercise activities discussed:     Goals Addressed             This Visit's Progress    Patient Stated       Wants to increase his physical activities       Depression Screen    01/19/2023   11:31 AM 03/11/2022   11:48 AM 12/16/2021    8:17 AM 11/19/2021   11:06 AM 11/03/2021   11:45 AM 11/18/2020   11:32 AM 11/02/2019    3:00 PM  PHQ 2/9 Scores  PHQ - 2 Score 4 2 0 0 0 0 0  PHQ- 9 Score 10 7         Fall Risk    01/17/2023   11:43 AM 03/11/2022   11:48 AM 12/16/2021    8:17 AM 11/19/2021   11:06 AM 11/03/2021   11:45 AM  Fall Risk   Falls in the past year? 0 0 0 0 0  Number falls in past yr: 0 0 0 0   Injury with Fall? 0 0 0    Risk for fall due to : No Fall Risks No Fall Risks No Fall Risks  No Fall Risks  Follow up Falls prevention discussed;Falls evaluation completed Falls evaluation completed Falls evaluation completed Falls evaluation completed Falls evaluation completed    MEDICARE RISK AT HOME: Medicare Risk at Home Any stairs in or around the home?: Yes If so, are there any without handrails?: No Home free of loose throw rugs in walkways, pet beds, electrical cords, etc?: Yes Adequate lighting in your home to reduce risk of falls?: Yes Life alert?: No Use of a cane, walker or w/c?: No Grab bars in the bathroom?:  No Shower chair or bench in shower?: No Elevated toilet seat or a handicapped toilet?: Yes  TIMED UP AND GO:  Was the test performed?  Yes  Length of time to ambulate 10 feet: 8 sec Gait steady and fast without use of assistive device    Cognitive Function:    10/06/2017   11:38 AM  MMSE - Mini Mental State Exam  Orientation to time 5  Orientation to Place 5  Registration 3  Attention/ Calculation 5  Recall 3  Language- name 2 objects 2  Language- repeat 1  Language- follow 3 step command 3  Language- read & follow direction 1  Write a sentence 1  Copy design 1  Total score 30        01/19/2023   11:37 AM 11/02/2019  3:02 PM  6CIT Screen  What Year? 0 points 0 points  What month? 0 points 0 points  What time? 0 points   Count back from 20 0 points   Months in reverse 0 points 0 points  Repeat phrase 0 points 0 points  Total Score 0 points     Immunizations Immunization History  Administered Date(s) Administered   Influenza, High Dose Seasonal PF 03/12/2020, 02/25/2021, 02/24/2022   Influenza-Unspecified 05/04/2017, 03/13/2018, 02/19/2019   PFIZER Comirnaty(Gray Top)Covid-19 Tri-Sucrose Vaccine 03/29/2020, 09/23/2020, 02/24/2022   PFIZER(Purple Top)SARS-COV-2 Vaccination 05/28/2019, 06/18/2019, 03/29/2020   Pneumococcal Conjugate-13 06/27/2015   Pneumococcal Polysaccharide-23 06/24/2017   Tdap 10/21/2015, 10/07/2020   Zoster Recombinant(Shingrix) 05/08/2019, 09/04/2019    TDAP status: Up to date  Flu Vaccine status: Up to date  Pneumococcal vaccine status: Up to date  Covid-19 vaccine status: Completed vaccines  Qualifies for Shingles Vaccine? Yes   Zostavax completed No   Shingrix Completed?: Yes  Screening Tests Health Maintenance  Topic Date Due   COVID-19 Vaccine (7 - 2023-24 season) 04/21/2022   Medicare Annual Wellness (AWV)  11/20/2022   INFLUENZA VACCINE  12/23/2022   DTaP/Tdap/Td (3 - Td or Tdap) 10/08/2030   Pneumonia Vaccine 66+  Years old  Completed   Hepatitis C Screening  Completed   Zoster Vaccines- Shingrix  Completed   HPV VACCINES  Aged Out   Colonoscopy  Discontinued    Health Maintenance  Health Maintenance Due  Topic Date Due   COVID-19 Vaccine (7 - 2023-24 season) 04/21/2022   Medicare Annual Wellness (AWV)  11/20/2022   INFLUENZA VACCINE  12/23/2022    Colorectal cancer screening: Type of screening: Colonoscopy. Completed 2/24. Repeat every 10 years   Lung Cancer Screening: (Low Dose CT Chest recommended if Age 56-80 years, 20 pack-year currently smoking OR have quit w/in 15years.) does not qualify.   Additional Screening:  Hepatitis C Screening: does qualify; Completed 11/19  Vision Screening: Recommended annual ophthalmology exams for early detection of glaucoma and other disorders of the eye. Is the patient up to date with their annual eye exam?  Yes  Who is the provider or what is the name of the office in which the patient attends annual eye exams? [facility]  If pt is not established with a provider, would they like to be referred to a provider to establish care? No .   Dental Screening: Recommended annual dental exams for proper oral hygiene   Community Resource Referral / Chronic Care Management: CRR required this visit?  No   CCM required this visit?  No     Plan:     I have personally reviewed and noted the following in the patient's chart:   Medical and social history Use of alcohol, tobacco or illicit drugs  Current medications and supplements including opioid prescriptions. Patient is currently taking opioid prescriptions. Information provided to patient regarding non-opioid alternatives. Patient advised to discuss non-opioid treatment plan with their provider. Functional ability and status Nutritional status Physical activity Advanced directives List of other physicians Hospitalizations, surgeries, and ER visits in previous 12 months Vitals Screenings to  include cognitive, depression, and falls Referrals and appointments  In addition, I have reviewed and discussed with patient certain preventive protocols, quality metrics, and best practice recommendations. A written personalized care plan for preventive services as well as general preventive health recommendations were provided to patient.     Sydell Axon, LPN   1/61/0960   After Visit Summary: (MyChart) Due to this being  a telephonic visit, the after visit summary with patients personalized plan was offered to patient via MyChart   Nurse Notes: None

## 2023-01-19 NOTE — Patient Instructions (Addendum)
Mike Farley , Thank you for taking time to come for your Medicare Wellness Visit. I appreciate your ongoing commitment to your health goals. Please review the following plan we discussed and let me know if I can assist you in the future.   Referrals/Orders/Follow-Ups/Clinician Recommendations: None  This is a list of the screening recommended for you and due dates:  Health Maintenance  Topic Date Due   COVID-19 Vaccine (7 - 2023-24 season) 04/21/2022   Flu Shot  12/23/2022   Medicare Annual Wellness Visit  01/19/2024   DTaP/Tdap/Td vaccine (3 - Td or Tdap) 10/08/2030   Pneumonia Vaccine  Completed   Hepatitis C Screening  Completed   Zoster (Shingles) Vaccine  Completed   HPV Vaccine  Aged Out   Colon Cancer Screening  Discontinued    Advanced directives: (Copy Requested) Please bring a copy of your health care power of attorney and living will to the office to be added to your chart at your convenience.  Next Medicare Annual Wellness Visit scheduled for next year: Yes 01/31/24 @ 10:30  Managing Pain Without Opioids  Opioids are strong medicines used to treat moderate to severe pain. For some people, especially those who have long-term (chronic) pain, opioids may not be the best choice for pain management due to: Side effects like nausea, constipation, and sleepiness. The risk of addiction (opioid use disorder). The longer you take opioids, the greater your risk of addiction. Pain that lasts for more than 3 months is called chronic pain. Managing chronic pain usually requires more than one approach and is often provided by a team of health care providers working together (multidisciplinary approach). Pain management may be done at a pain management center or pain clinic. How to manage pain without the use of opioids Use non-opioid medicines Non-opioid medicines for pain may include: Over-the-counter or prescription non-steroidal anti-inflammatory drugs (NSAIDs). These may be the  first medicines used for pain. They work well for muscle and bone pain, and they reduce swelling. Acetaminophen. This over-the-counter medicine may work well for milder pain but not swelling. Antidepressants. These may be used to treat chronic pain. A certain type of antidepressant (tricyclics) is often used. These medicines are given in lower doses for pain than when used for depression. Anticonvulsants. These are usually used to treat seizures but may also reduce nerve (neuropathic) pain. Muscle relaxants. These relieve pain caused by sudden muscle tightening (spasms). You may also use a pain medicine that is applied to the skin as a patch, cream, or gel (topical analgesic), such as a numbing medicine. These may cause fewer side effects than medicines taken by mouth. Do certain therapies as directed Some therapies can help with pain management. They include: Physical therapy. You will do exercises to gain strength and flexibility. A physical therapist may teach you exercises to move and stretch parts of your body that are weak, stiff, or painful. You can learn these exercises at physical therapy visits and practice them at home. Physical therapy may also involve: Massage. Heat wraps or applying heat or cold to affected areas. Electrical signals that interrupt pain signals (transcutaneous electrical nerve stimulation, TENS). Weak lasers that reduce pain and swelling (low-level laser therapy). Signals from your body that help you learn to regulate pain (biofeedback). Occupational therapy. This helps you to learn ways to function at home and work with less pain. Recreational therapy. This involves trying new activities or hobbies, such as a physical activity or drawing. Mental health therapy, including: Cognitive behavioral therapy (  CBT). This helps you learn coping skills for dealing with pain. Acceptance and commitment therapy (ACT) to change the way you think and react to pain. Relaxation  therapies, including muscle relaxation exercises and mindfulness-based stress reduction. Pain management counseling. This may be individual, family, or group counseling.  Receive medical treatments Medical treatments for pain management include: Nerve block injections. These may include a pain blocker and anti-inflammatory medicines. You may have injections: Near the spine to relieve chronic back or neck pain. Into joints to relieve back or joint pain. Into nerve areas that supply a painful area to relieve body pain. Into muscles (trigger point injections) to relieve some painful muscle conditions. A medical device placed near your spine to help block pain signals and relieve nerve pain or chronic back pain (spinal cord stimulation device). Acupuncture. Follow these instructions at home Medicines Take over-the-counter and prescription medicines only as told by your health care provider. If you are taking pain medicine, ask your health care providers about possible side effects to watch out for. Do not drive or use heavy machinery while taking prescription opioid pain medicine. Lifestyle  Do not use drugs or alcohol to reduce pain. If you drink alcohol, limit how much you have to: 0-1 drink a day for women who are not pregnant. 0-2 drinks a day for men. Know how much alcohol is in a drink. In the [place] one drink equals one 12 oz bottle of beer (355 mL), one 5 oz glass of wine (148 mL), or one 1 oz glass of hard liquor (44 mL). Do not use any products that contain nicotine or tobacco. These products include cigarettes, chewing tobacco, and vaping devices, such as e-cigarettes. If you need help quitting, ask your health care provider. Eat a healthy diet and maintain a healthy weight. Poor diet and excess weight may make pain worse. Eat foods that are high in fiber. These include fresh fruits and vegetables, whole grains, and beans. Limit foods that are high in fat and processed sugars, such  as fried and sweet foods. Exercise regularly. Exercise lowers stress and may help relieve pain. Ask your health care provider what activities and exercises are safe for you. If your health care provider approves, join an exercise class that combines movement and stress reduction. Examples include yoga and tai chi. Get enough sleep. Lack of sleep may make pain worse. Lower stress as much as possible. Practice stress reduction techniques as told by your therapist. General instructions Work with all your pain management providers to find the treatments that work best for you. You are an important member of your pain management team. There are many things you can do to reduce pain on your own. Consider joining an online or in-person support group for people who have chronic pain. Keep all follow-up visits. This is important. Where to find more information You can find more information about managing pain without opioids from: American Academy of Pain Medicine: painmed.org Institute for Chronic Pain: instituteforchronicpain.org American Chronic Pain Association: theacpa.org Contact a health care provider if: You have side effects from pain medicine. Your pain gets worse or does not get better with treatments or home therapy. You are struggling with anxiety or depression. Summary Many types of pain can be managed without opioids. Chronic pain may respond better to pain management without opioids. Pain is best managed when you and a team of health care providers work together. Pain management without opioids may include non-opioid medicines, medical treatments, physical therapy, mental health therapy,  and lifestyle changes. Tell your health care providers if your pain gets worse or is not being managed well enough. This information is not intended to replace advice given to you by your health care provider. Make sure you discuss any questions you have with your health care provider. Document  Revised: 08/20/2020 Document Reviewed: 08/20/2020 Elsevier Patient Education  2024 [employer].

## 2023-02-01 DIAGNOSIS — M4721 Other spondylosis with radiculopathy, occipito-atlanto-axial region: Secondary | ICD-10-CM | POA: Diagnosis not present

## 2023-02-01 DIAGNOSIS — G894 Chronic pain syndrome: Secondary | ICD-10-CM | POA: Diagnosis not present

## 2023-02-01 DIAGNOSIS — M47812 Spondylosis without myelopathy or radiculopathy, cervical region: Secondary | ICD-10-CM | POA: Diagnosis not present

## 2023-02-01 DIAGNOSIS — M4802 Spinal stenosis, cervical region: Secondary | ICD-10-CM | POA: Diagnosis not present

## 2023-02-02 DIAGNOSIS — H2513 Age-related nuclear cataract, bilateral: Secondary | ICD-10-CM | POA: Diagnosis not present

## 2023-02-02 DIAGNOSIS — H25013 Cortical age-related cataract, bilateral: Secondary | ICD-10-CM | POA: Diagnosis not present

## 2023-02-22 ENCOUNTER — Encounter: Payer: Self-pay | Admitting: Internal Medicine

## 2023-02-22 NOTE — Telephone Encounter (Signed)
B12 level was checked in June and was ok. Can schedule an appt to discuss if further testing warranted for the decreased energy

## 2023-03-09 ENCOUNTER — Encounter: Payer: Self-pay | Admitting: Internal Medicine

## 2023-03-09 NOTE — Telephone Encounter (Signed)
Ok to get vaccines.  RSV is a one time vaccine.  New covid vaccine is available.  Given age, recommend high dose flu vaccine.

## 2023-03-16 DIAGNOSIS — G3184 Mild cognitive impairment, so stated: Secondary | ICD-10-CM | POA: Diagnosis not present

## 2023-03-22 NOTE — Telephone Encounter (Signed)
Fatigue can be normal with these treatments, but given the severity of symptoms with the treatment, I would recommend contacting oncology to let them know and see if further w/up / evaluation warranted.  Let me know if I need to do anything.

## 2023-03-28 ENCOUNTER — Telehealth: Payer: Self-pay | Admitting: Internal Medicine

## 2023-03-28 DIAGNOSIS — R739 Hyperglycemia, unspecified: Secondary | ICD-10-CM

## 2023-03-28 DIAGNOSIS — D649 Anemia, unspecified: Secondary | ICD-10-CM

## 2023-03-28 DIAGNOSIS — E7800 Pure hypercholesterolemia, unspecified: Secondary | ICD-10-CM

## 2023-03-28 NOTE — Telephone Encounter (Signed)
Patient need lab orders.

## 2023-03-31 NOTE — Telephone Encounter (Signed)
Future labs ordered.  

## 2023-04-01 ENCOUNTER — Other Ambulatory Visit (INDEPENDENT_AMBULATORY_CARE_PROVIDER_SITE_OTHER): Payer: Medicare HMO

## 2023-04-01 DIAGNOSIS — E7800 Pure hypercholesterolemia, unspecified: Secondary | ICD-10-CM | POA: Diagnosis not present

## 2023-04-01 DIAGNOSIS — D649 Anemia, unspecified: Secondary | ICD-10-CM | POA: Diagnosis not present

## 2023-04-01 DIAGNOSIS — R739 Hyperglycemia, unspecified: Secondary | ICD-10-CM

## 2023-04-01 LAB — CBC WITH DIFFERENTIAL/PLATELET
Basophils Absolute: 0 10*3/uL (ref 0.0–0.1)
Basophils Relative: 0.5 % (ref 0.0–3.0)
Eosinophils Absolute: 0.2 10*3/uL (ref 0.0–0.7)
Eosinophils Relative: 3.9 % (ref 0.0–5.0)
HCT: 36.7 % — ABNORMAL LOW (ref 39.0–52.0)
Hemoglobin: 12.5 g/dL — ABNORMAL LOW (ref 13.0–17.0)
Lymphocytes Relative: 27.5 % (ref 12.0–46.0)
Lymphs Abs: 1.3 10*3/uL (ref 0.7–4.0)
MCHC: 34 g/dL (ref 30.0–36.0)
MCV: 94.3 fL (ref 78.0–100.0)
Monocytes Absolute: 0.4 10*3/uL (ref 0.1–1.0)
Monocytes Relative: 8.9 % (ref 3.0–12.0)
Neutro Abs: 2.8 10*3/uL (ref 1.4–7.7)
Neutrophils Relative %: 59.2 % (ref 43.0–77.0)
Platelets: 206 10*3/uL (ref 150.0–400.0)
RBC: 3.89 Mil/uL — ABNORMAL LOW (ref 4.22–5.81)
RDW: 13.5 % (ref 11.5–15.5)
WBC: 4.8 10*3/uL (ref 4.0–10.5)

## 2023-04-01 LAB — HEPATIC FUNCTION PANEL
ALT: 16 U/L (ref 0–53)
AST: 25 U/L (ref 0–37)
Albumin: 4.1 g/dL (ref 3.5–5.2)
Alkaline Phosphatase: 100 U/L (ref 39–117)
Bilirubin, Direct: 0.1 mg/dL (ref 0.0–0.3)
Total Bilirubin: 0.6 mg/dL (ref 0.2–1.2)
Total Protein: 6.5 g/dL (ref 6.0–8.3)

## 2023-04-01 LAB — BASIC METABOLIC PANEL
BUN: 24 mg/dL — ABNORMAL HIGH (ref 6–23)
CO2: 32 meq/L (ref 19–32)
Calcium: 9.3 mg/dL (ref 8.4–10.5)
Chloride: 103 meq/L (ref 96–112)
Creatinine, Ser: 0.91 mg/dL (ref 0.40–1.50)
GFR: 84.01 mL/min (ref >60.00)
Glucose, Bld: 99 mg/dL (ref 70–99)
Potassium: 4.4 meq/L (ref 3.5–5.1)
Sodium: 141 meq/L (ref 135–145)

## 2023-04-01 LAB — LIPID PANEL
Cholesterol: 167 mg/dL (ref 0–200)
HDL: 73.8 mg/dL (ref >39.00)
LDL Cholesterol: 73 mg/dL (ref 0–99)
NonHDL: 93.23
Total CHOL/HDL Ratio: 2
Triglycerides: 99 mg/dL (ref 0.0–149.0)
VLDL: 19.8 mg/dL (ref 0.0–40.0)

## 2023-04-01 LAB — HEMOGLOBIN A1C: Hgb A1c MFr Bld: 5.7 % (ref 4.6–6.5)

## 2023-04-04 DIAGNOSIS — M47812 Spondylosis without myelopathy or radiculopathy, cervical region: Secondary | ICD-10-CM | POA: Diagnosis not present

## 2023-04-04 DIAGNOSIS — M4802 Spinal stenosis, cervical region: Secondary | ICD-10-CM | POA: Diagnosis not present

## 2023-04-04 DIAGNOSIS — G894 Chronic pain syndrome: Secondary | ICD-10-CM | POA: Diagnosis not present

## 2023-04-04 DIAGNOSIS — M4721 Other spondylosis with radiculopathy, occipito-atlanto-axial region: Secondary | ICD-10-CM | POA: Diagnosis not present

## 2023-04-05 ENCOUNTER — Encounter: Payer: Self-pay | Admitting: Internal Medicine

## 2023-04-05 ENCOUNTER — Ambulatory Visit (INDEPENDENT_AMBULATORY_CARE_PROVIDER_SITE_OTHER): Payer: Medicare HMO | Admitting: Internal Medicine

## 2023-04-05 VITALS — BP 118/72 | HR 74 | Temp 98.0°F | Resp 16 | Ht 70.0 in | Wt 171.8 lb

## 2023-04-05 DIAGNOSIS — G3184 Mild cognitive impairment, so stated: Secondary | ICD-10-CM

## 2023-04-05 DIAGNOSIS — C61 Malignant neoplasm of prostate: Secondary | ICD-10-CM

## 2023-04-05 DIAGNOSIS — I700 Atherosclerosis of aorta: Secondary | ICD-10-CM | POA: Diagnosis not present

## 2023-04-05 DIAGNOSIS — H9190 Unspecified hearing loss, unspecified ear: Secondary | ICD-10-CM | POA: Diagnosis not present

## 2023-04-05 DIAGNOSIS — D649 Anemia, unspecified: Secondary | ICD-10-CM | POA: Diagnosis not present

## 2023-04-05 DIAGNOSIS — R739 Hyperglycemia, unspecified: Secondary | ICD-10-CM

## 2023-04-05 DIAGNOSIS — F320 Major depressive disorder, single episode, mild: Secondary | ICD-10-CM | POA: Diagnosis not present

## 2023-04-05 DIAGNOSIS — E7800 Pure hypercholesterolemia, unspecified: Secondary | ICD-10-CM | POA: Diagnosis not present

## 2023-04-05 NOTE — Progress Notes (Signed)
Subjective:    Patient ID: Mike Farley, male    DOB: 05/15/1951, 72 y.o.   MRN: [id number]  Patient here for  Chief Complaint  Patient presents with   Medical Management of Chronic Issues    HPI Here for a scheduled follow up - follow up regarding hypercholesterolemia, increased stress and prostate cancer. Seeing neurology for [facility] - on aricept.  Discussed memory.  He is trying to stay active.  Does report increased fatigue with his treatment, but overall doing ok.  No chest pain.  Breathing stable.  No increased cough or congestion.  No abdominal pain reported.  Some bowel change and urine issues with radiation. Handling stress. Does report decreased hearing.  Discussed referral for hearing evaluation.    Past Medical History:  Diagnosis Date   Anxiety    Arthritis    shoulder   Cancer (HCC)    prostate ca - Md just watching   Cervical pain (neck)    limited mobility   Chronic pain    Decreased ROM of neck    Depression    years ago (minor)   Diverticulosis    Duodenal diverticulum    Elevated PSA    Hemorrhoids    Hiatal hernia    History of chicken pox    HLD (hyperlipidemia)    Hx of cold sores    Internal hemorrhoids    Mild cognitive impairment with memory loss    Neuropathy    Past Surgical History:  Procedure Laterality Date   ABDOMINAL EXPOSURE N/A 04/23/2021   Procedure: ABDOMINAL EXPOSURE;  Surgeon: Cephus Shelling, MD;  Location: [facility];  Service: Vascular;  Laterality: N/A;   ANTERIOR FUSION CERVICAL SPINE     4-5 levels; has had 3-4 surgeries for this   ANTERIOR LUMBAR FUSION N/A 04/23/2021   Procedure: Lumbar four-five,  Lumbar five-Sacral one Anterior Lumbar Interbody Fusion;  Surgeon: Dawley, Alan Mulder, DO;  Location: [facility] OR;  Service: Neurosurgery;  Laterality: N/A;   APPENDECTOMY     BACK SURGERY     BACK SURGERY  04/2022   BONE EXCISION Left 09/28/2017   Procedure: BONE EXCISION-TAILORS  EXOSTECTOMY;  Surgeon: Gwyneth Revels, DPM;   Location: [facility];  Service: Podiatry;  Laterality: Left;  IVA LOCAL   CERVICAL DISCECTOMY  2008   C2-C7 Fused    CHOLECYSTECTOMY     COLONOSCOPY  2020   COLONOSCOPY  2019   [facility] Due 03/2022 per patient   ELBOW FRACTURE SURGERY Left 1990   ESOPHAGOGASTRODUODENOSCOPY (EGD) WITH PROPOFOL N/A 10/06/2021   Procedure: ESOPHAGOGASTRODUODENOSCOPY (EGD) WITH PROPOFOL;  Surgeon: Regis Bill, MD;  Location: [facility];  Service: Endoscopy;  Laterality: N/A;   LUMBAR PERCUTANEOUS PEDICLE SCREW 2 LEVEL N/A 04/23/2021   Procedure: LUMBAR PERCUTANEOUS PEDICLE SCREW LUMBAR FOUR-SACRAL ONE;  Surgeon: Bethann Goo, DO;  Location: [facility];  Service: Neurosurgery;  Laterality: N/A;   METATARSAL HEAD EXCISION Left 07/04/2020   Procedure: METATARSAL HEAD EXCISION;  Surgeon: Gwyneth Revels, DPM;  Location: [facility];  Service: Podiatry;  Laterality: Left;   PROSTATE BIOPSY N/A 03/15/2019   Procedure: PROSTATE BIOPSY Uro Nav;  Surgeon: Orson Ape, MD;  Location: [facility];  Service: Urology;  Laterality: N/A;   RADIOLOGY WITH ANESTHESIA N/A 04/22/2020   Procedure: MRI WITH ANESTHESIA BRAIN WITHOUT CONTRAST;  Surgeon: Radiologist, Medication, MD;  Location: [facility] OR;  Service: Radiology;  Laterality: N/A;   REPLACEMENT TOTAL KNEE Right  right knee   ROTATOR CUFF REPAIR Right 2018   with arthroscopy and distal claviculectomy   TONSILLECTOMY AND ADENOIDECTOMY     TOTAL KNEE ARTHROPLASTY Right 2005   x 4   Family History  Problem Relation Age of Onset   Arthritis Mother    Breast cancer Mother    Mental illness Mother    Colon cancer Mother    Bipolar disorder Mother    Depression Mother    Colon polyps Mother    Lung cancer Father    Prostate cancer Father    Alcohol abuse Brother    Kidney disease Brother    Esophageal cancer Neg Hx    Social History   Socioeconomic History   Marital status: Married    Spouse name: Not on file    Number of children: Not on file   Years of education: Not on file   Highest education level: Professional school degree (e.g., MD, DDS, DVM, JD)  Occupational History   Not on file  Tobacco Use   Smoking status: Former   Smokeless tobacco: Former    Types: Chew   Tobacco comments:    Quit 50 yrs ago for smoking and quit chewing 2 months ago  Vaping Use   Vaping status: Never Used  Substance and Sexual Activity   Alcohol use: Yes    Alcohol/week: 7.0 standard drinks of alcohol    Types: 7 Standard drinks or equivalent per week    Comment: beer/wine   Drug use: Never   Sexual activity: Not Currently  Other Topics Concern   Not on file  Social History Narrative   Married   Social Determinants of Health   Financial Resource Strain: Low Risk  (01/17/2023)   Overall Financial Resource Strain (CARDIA)    Difficulty of Paying Living Expenses: Not hard at all  Food Insecurity: No Food Insecurity (01/17/2023)   Hunger Vital Sign    Worried About Running Out of Food in the Last Year: Never true    Ran Out of Food in the Last Year: Never true  Transportation Needs: No Transportation Needs (01/17/2023)   PRAPARE - Administrator, Civil Service (Medical): No    Lack of Transportation (Non-Medical): No  Physical Activity: Sufficiently Active (01/17/2023)   Exercise Vital Sign    Days of Exercise per Week: 4 days    Minutes of Exercise per Session: 40 min  Stress: Stress Concern Present (01/19/2023)   [employer] of Occupational Health - Occupational Stress Questionnaire    Feeling of Stress : Rather much  Social Connections: Socially Integrated (01/17/2023)   Social Connection and Isolation Panel [NHANES]    Frequency of Communication with Friends and Family: More than three times a week    Frequency of Social Gatherings with Friends and Family: More than three times a week    Attends Religious Services: More than 4 times per year    Active Member of [employer] or  Organizations: Yes    Attends Engineer, structural: More than 4 times per year    Marital Status: Married     Review of Systems  Constitutional:  Positive for fatigue. Negative for appetite change and unexpected weight change.  HENT:  Positive for hearing loss. Negative for congestion and sinus pressure.   Respiratory:  Negative for cough, chest tightness and shortness of breath.   Cardiovascular:  Negative for chest pain and palpitations.  Gastrointestinal:  Negative for abdominal pain, diarrhea, nausea and vomiting.  Genitourinary:  Negative for dysuria.       Some urinary issues with radiation.    Musculoskeletal:  Negative for joint swelling and myalgias.  Skin:  Negative for color change and rash.  Neurological:  Negative for dizziness and headaches.  Psychiatric/Behavioral:  Negative for agitation and dysphoric mood.        Objective:     BP 118/72   Pulse 74   Temp 98 F (36.7 C)   Resp 16   Ht 5\' 10"  (1.778 m)   Wt 171 lb 12.8 oz (77.9 kg)   SpO2 98%   BMI 24.65 kg/m  Wt Readings from Last 3 Encounters:  04/05/23 171 lb 12.8 oz (77.9 kg)  01/19/23 166 lb (75.3 kg)  11/30/22 169 lb (76.7 kg)    Physical Exam Vitals reviewed.  Constitutional:      General: He is not in acute distress.    Appearance: Normal appearance. He is well-developed.  HENT:     Head: Normocephalic and atraumatic.     Right Ear: External ear normal.     Left Ear: External ear normal.  Eyes:     General: No scleral icterus.       Right eye: No discharge.        Left eye: No discharge.     Conjunctiva/sclera: Conjunctivae normal.  Cardiovascular:     Rate and Rhythm: Normal rate and regular rhythm.  Pulmonary:     Effort: Pulmonary effort is normal. No respiratory distress.     Breath sounds: Normal breath sounds.  Abdominal:     General: Bowel sounds are normal.     Palpations: Abdomen is soft.     Tenderness: There is no abdominal tenderness.  Musculoskeletal:         General: No swelling or tenderness.     Cervical back: Neck supple. No tenderness.  Lymphadenopathy:     Cervical: No cervical adenopathy.  Skin:    Findings: No erythema or rash.  Neurological:     Mental Status: He is alert.  Psychiatric:        Mood and Affect: Mood normal.        Behavior: Behavior normal.      Outpatient Encounter Medications as of 04/05/2023  Medication Sig   cholecalciferol (VITAMIN D3) 25 MCG (1000 UT) tablet Take 1,000 Units by mouth 3 (three) times a week.    cyanocobalamin 1000 MCG tablet Take 1,000 mcg by mouth daily.    donepezil (ARICEPT) 5 MG tablet Take 5 mg by mouth daily.   Melatonin 1 MG CAPS Take by mouth.   Multiple Vitamin (MULTIVITAMIN) tablet Take 1 tablet by mouth daily. ABC Complete MEN   ondansetron (ZOFRAN) 4 MG tablet Take 4 mg by mouth every 8 (eight) hours as needed for nausea or vomiting.   Oxycodone HCl 10 MG TABS 10 mg 4 (four) times daily   pantoprazole (PROTONIX) 20 MG tablet Take 1 tablet (20 mg total) by mouth daily.   QUEtiapine (SEROQUEL) 300 MG tablet TAKE 1/2 TO 1 TABLET AT BEDTIME   rosuvastatin (CRESTOR) 10 MG tablet TAKE 1 TABLET BY MOUTH DAILY   tadalafil (CIALIS) 5 MG tablet Take 5 mg by mouth daily.   tamsulosin (FLOMAX) 0.4 MG CAPS capsule Take 0.4 mg by mouth.   valACYclovir (VALTREX) 1000 MG tablet TAKE ONE (1) TABLET BY MOUTH TWO TIMES PER DAY. TAKE FOR ONE DAY AT ONSET OF BREAKOUT.   No facility-administered encounter medications on file as of  04/05/2023.     Lab Results  Component Value Date   WBC 4.8 04/01/2023   HGB 12.5 (L) 04/01/2023   HCT 36.7 (L) 04/01/2023   PLT 206.0 04/01/2023   GLUCOSE 99 04/01/2023   CHOL 167 04/01/2023   TRIG 99.0 04/01/2023   HDL 73.80 04/01/2023   LDLCALC 73 04/01/2023   ALT 16 04/01/2023   AST 25 04/01/2023   NA 141 04/01/2023   K 4.4 04/01/2023   CL 103 04/01/2023   CREATININE 0.91 04/01/2023   BUN 24 (H) 04/01/2023   CO2 32 04/01/2023   TSH 3.61 07/14/2022    PSA 16.07 (H) 09/29/2022   INR 1.03 05/29/2009   HGBA1C 5.7 04/01/2023    NM GASTRIC EMPTYING  Result Date: 11/13/2021 CLINICAL DATA:  Chronic nausea and vomiting. EXAM: NUCLEAR MEDICINE GASTRIC EMPTYING SCAN TECHNIQUE: After oral ingestion of radiolabeled meal, sequential abdominal images were obtained for 4 hours. Percentage of activity emptying the stomach was calculated at 1 hour, 2 hour, 3 hour, and 4 hours. RADIOPHARMACEUTICALS:  2.16 mCi Tc-51m sulfur colloid in standardized meal COMPARISON:  CT August 26, 2021 FINDINGS: Expected location of the stomach in the left upper quadrant. Ingested meal empties the stomach gradually over the course of the study. 60% emptied at 1 hr ( normal >= 10%) 97% emptied at 2 hr ( normal >= 40%) 98% emptied at 3 hr ( normal >= 70%) 99% emptied at 4 hr ( normal >= 90%) IMPRESSION: No scintigraphic evidence of delayed gastric emptying. Electronically Signed   By: Maudry Mayhew M.D.   On: 11/13/2021 15:17       Assessment & Plan:  Anemia, unspecified type Assessment & Plan: CBC being followed - oncology. Check cbc, B12 and iron studies.   Orders: -     CBC with Differential/Platelet; Future -     IBC + Ferritin; Future -     Vitamin B12; Future  Hearing loss, unspecified hearing loss type, unspecified laterality Assessment & Plan: Noted hearing change.  Refer to ENT for further evaluation.   Orders: -     Ambulatory referral to ENT  Prostate cancer [facility]) Assessment & Plan: Seeing oncology.  Undergoing treatment as outlined.  Some increased fatigue. Continue f/u with oncology.    Mild cognitive impairment Assessment & Plan: Mild cognitive impairment.  Saw neurology.  Continue aricept.  Follow.    Hyperglycemia Assessment & Plan: Low carb diet and exercise.  Follow met b and a1c.    Hypercholesteremia Assessment & Plan: Continue crestor.  Low cholesterol diet and exercise.  Follow lipid panel and liver function tests.    Aortic  atherosclerosis (HCC) Assessment & Plan: Continue crestor.     Major depressive disorder, single episode, mild (HCC) Assessment & Plan: Continues on seroquel.  Follow. Stable.       Dale Erlanger, MD

## 2023-04-10 ENCOUNTER — Encounter: Payer: Self-pay | Admitting: Internal Medicine

## 2023-04-10 DIAGNOSIS — H9190 Unspecified hearing loss, unspecified ear: Secondary | ICD-10-CM | POA: Insufficient documentation

## 2023-04-10 DIAGNOSIS — F320 Major depressive disorder, single episode, mild: Secondary | ICD-10-CM | POA: Insufficient documentation

## 2023-04-10 NOTE — Assessment & Plan Note (Signed)
Seeing oncology.  Undergoing treatment as outlined.  Some increased fatigue. Continue f/u with oncology.

## 2023-04-10 NOTE — Assessment & Plan Note (Signed)
Continue crestor 

## 2023-04-10 NOTE — Assessment & Plan Note (Signed)
Noted hearing change.  Refer to ENT for further evaluation.

## 2023-04-10 NOTE — Assessment & Plan Note (Signed)
Continue crestor.  Low cholesterol diet and exercise.  Follow lipid panel and liver function tests.  

## 2023-04-10 NOTE — Assessment & Plan Note (Signed)
Continues on seroquel.  Follow. Stable.

## 2023-04-10 NOTE — Assessment & Plan Note (Signed)
Low carb diet and exercise.  Follow met b and a1c.   

## 2023-04-10 NOTE — Assessment & Plan Note (Addendum)
CBC being followed - oncology. Check cbc, B12 and iron studies.

## 2023-04-10 NOTE — Assessment & Plan Note (Signed)
Mild cognitive impairment.  Saw neurology.  Continue aricept.  Follow.

## 2023-04-18 DIAGNOSIS — C61 Malignant neoplasm of prostate: Secondary | ICD-10-CM | POA: Diagnosis not present

## 2023-04-27 ENCOUNTER — Other Ambulatory Visit: Payer: Self-pay | Admitting: Internal Medicine

## 2023-04-28 NOTE — Telephone Encounter (Signed)
Rx ok'd for seroquel

## 2023-04-28 NOTE — Telephone Encounter (Signed)
Last refill 11/10/22 F/u scheduled 07/2023

## 2023-04-28 NOTE — Telephone Encounter (Signed)
Prescription Request  04/28/2023  LOV: 04/05/2023  What is the name of the medication or equipment? SEROQUEL   Have you contacted your pharmacy to request a refill? No   Which pharmacy would you like this sent to?  [facility]  Patient notified that their request is being sent to the clinical staff for review and that they should receive a response within 2 business days.   Please advise at Mobile [phone] (mobile)

## 2023-04-29 ENCOUNTER — Telehealth (INDEPENDENT_AMBULATORY_CARE_PROVIDER_SITE_OTHER): Payer: Medicare HMO | Admitting: Family Medicine

## 2023-04-29 DIAGNOSIS — R053 Chronic cough: Secondary | ICD-10-CM | POA: Diagnosis not present

## 2023-04-29 DIAGNOSIS — J329 Chronic sinusitis, unspecified: Secondary | ICD-10-CM | POA: Diagnosis not present

## 2023-04-29 MED ORDER — DOXYCYCLINE HYCLATE 100 MG PO TABS: 100.0000 mg | ORAL_TABLET | Freq: Two times a day (BID) | ORAL | 0 refills | Status: AC

## 2023-04-29 MED ORDER — FLUTICASONE PROPIONATE 50 MCG/ACT NA SUSP: 2.0000 | Freq: Every day | NASAL | 6 refills | Status: DC

## 2023-04-29 MED ORDER — SACCHAROMYCES BOULARDII 250 MG PO CAPS: 250.0000 mg | ORAL_CAPSULE | Freq: Every day | ORAL | 0 refills | Status: DC

## 2023-04-29 NOTE — Patient Instructions (Addendum)
It was a pleasure meeting you today. Thank you for allowing me to take part in your health care.  Our goals for today as we discussed include:  Start Doxycycline 1 tablet two times a day for 5 days Take probiotics daily when starting antibiotics. Only start antibiotics if develops fevers or worsening congestion  Ensure well hydrated   Start Flonase 2 spray to each nostril daily  Follow up with PCP if no improvement.  This is a list of the screening recommended for you and due dates:  Health Maintenance  Topic Date Due   COVID-19 Vaccine (8 - 2023-24 season) 05/11/2023   Medicare Annual Wellness Visit  01/19/2024   DTaP/Tdap/Td vaccine (3 - Td or Tdap) 10/08/2030   Pneumonia Vaccine  Completed   Flu Shot  Completed   Hepatitis C Screening  Completed   Zoster (Shingles) Vaccine  Completed   HPV Vaccine  Aged Out   Colon Cancer Screening  Discontinued      If you have any questions or concerns, please do not hesitate to call the office at [phone].  I look forward to our next visit and until then take care and stay safe.  Regards,   Dana Allan, MD   [facility]

## 2023-04-29 NOTE — Progress Notes (Unsigned)
Virtual Visit via Video note  I connected with Mike Farley on 05/01/23 at 1211 by video and verified that I am speaking with the correct person using two identifiers. Mike Farley is currently located at home and alone is currently with him during visit. The provider, Dana Allan, MD is located in their office at time of visit.  I discussed the limitations, risks, security and privacy concerns of performing an evaluation and management service by video and the availability of in person appointments. I also discussed with the patient that there may be a patient responsible charge related to this service. The patient expressed understanding and agreed to proceed.  Subjective: PCP: Dale Eros, MD  Chief Complaint  Patient presents with   Sinus Problem    Couple days ago congestion , has a cough coughing up phlegm. Hx sinus infections.     HPI Discussed the use of AI scribe software for clinical note transcription with the patient, who gave verbal consent to proceed.  History of Present Illness The patient, born in 1952, reports experiencing symptoms similar to those he experiences annually around this time. The symptoms began two days prior to the consultation and include the production of greenish-yellow sputum and a cough that the patient describes as being felt in the chest. The patient has a history of similar symptoms starting as a sinus infection and progressing to the chest, requiring antibiotic treatment.  The patient denies any fever or shortness of breath but admits to slight wheezing. He has been self-medicating with Dayquil and Nyquil, starting the day before the consultation. The patient denies recent exposure to sick individuals and confirms up-to-date vaccinations, excluding RSV.  The patient has a history of smoking approximately 40 years ago. He also has a history of sinus and chest infections, with the most recent episode occurring in July of the previous  year. During that episode, the patient was treated with Tylenol, honey tea, Citracal, and Flonase. The patient admits to not currently using Flonase.  The patient's primary concern is the greenish-yellow sputum he is producing, which he associates with infection. The patient reports the cough is most severe upon waking and denies any sensation of postnasal drip. He also denies any sinus tenderness. The patient has a known allergy to amoxicillin. He has previously taken steroids and is currently not on any stomach medication. The patient reports a high water intake, approximately a gallon per day.     ROS: Per HPI  Current Outpatient Medications:    doxycycline (VIBRA-TABS) 100 MG tablet, Take 1 tablet (100 mg total) by mouth 2 (two) times daily for 5 days., Disp: 10 tablet, Rfl: 0   fluticasone (FLONASE) 50 MCG/ACT nasal spray, Place 2 sprays into both nostrils daily., Disp: 16 g, Rfl: 6   saccharomyces boulardii (FLORASTOR) 250 MG capsule, Take 1 capsule (250 mg total) by mouth daily., Disp: 90 capsule, Rfl: 0   cholecalciferol (VITAMIN D3) 25 MCG (1000 UT) tablet, Take 1,000 Units by mouth 3 (three) times a week. , Disp: , Rfl:    cyanocobalamin 1000 MCG tablet, Take 1,000 mcg by mouth daily. , Disp: , Rfl:    donepezil (ARICEPT) 5 MG tablet, Take 5 mg by mouth daily., Disp: , Rfl:    Melatonin 1 MG CAPS, Take by mouth., Disp: , Rfl:    Multiple Vitamin (MULTIVITAMIN) tablet, Take 1 tablet by mouth daily. ABC Complete MEN, Disp: , Rfl:    ondansetron (ZOFRAN) 4 MG tablet, Take 4 mg  by mouth every 8 (eight) hours as needed for nausea or vomiting. (Patient not taking: Reported on 04/29/2023), Disp: , Rfl:    Oxycodone HCl 10 MG TABS, 10 mg 4 (four) times daily, Disp: , Rfl:    pantoprazole (PROTONIX) 20 MG tablet, Take 1 tablet (20 mg total) by mouth daily., Disp: 90 tablet, Rfl: 3   QUEtiapine (SEROQUEL) 300 MG tablet, Take 1/2 tablet q hs, Disp: 90 tablet, Rfl: 0   rosuvastatin (CRESTOR) 10 MG  tablet, TAKE 1 TABLET BY MOUTH DAILY, Disp: 90 tablet, Rfl: 3   tadalafil (CIALIS) 5 MG tablet, Take 5 mg by mouth daily., Disp: , Rfl:    tamsulosin (FLOMAX) 0.4 MG CAPS capsule, Take 0.4 mg by mouth., Disp: , Rfl:    valACYclovir (VALTREX) 1000 MG tablet, TAKE ONE (1) TABLET BY MOUTH TWO TIMES PER DAY. TAKE FOR ONE DAY AT ONSET OF BREAKOUT., Disp: 60 tablet, Rfl: 0  Observations/Objective: Physical Exam Vitals reviewed.  Constitutional:      General: He is not in acute distress.    Appearance: Normal appearance. He is not toxic-appearing.  Eyes:     Conjunctiva/sclera: Conjunctivae normal.  Pulmonary:     Effort: Pulmonary effort is normal.  Neurological:     Mental Status: He is alert. Mental status is at baseline.  Psychiatric:        Mood and Affect: Mood normal.        Behavior: Behavior normal.        Thought Content: Thought content normal.        Judgment: Judgment normal.    Assessment and Plan: Recurrent sinusitis Assessment & Plan: Recent onset of cough with greenish-yellow sputum, no fever, no shortness of breath. History of similar symptoms around this time of year, typically requiring antibiotics. -Start Flonase as previously prescribed. -Start Doxycycline only if fever develops or chest congestion worsens. -If Doxycycline is started, also start a probiotic and continue for 14 days after completion of antibiotic course. -Continue hydration.  Orders: -     Fluticasone Propionate; Place 2 sprays into both nostrils daily.  Dispense: 16 g; Refill: 6  Persistent cough -     Doxycycline Hyclate; Take 1 tablet (100 mg total) by mouth 2 (two) times daily for 5 days.  Dispense: 10 tablet; Refill: 0 -     Saccharomyces boulardii; Take 1 capsule (250 mg total) by mouth daily.  Dispense: 90 capsule; Refill: 0    Follow Up Instructions: Return if symptoms worsen or fail to improve, for PCP.   I discussed the assessment and treatment plan with the patient. The patient  was provided an opportunity to ask questions and all were answered. The patient agreed with the plan and demonstrated an understanding of the instructions.   The patient was advised to call back or seek an in-person evaluation if the symptoms worsen or if the condition fails to improve as anticipated.  The above assessment and management plan was discussed with the patient. The patient verbalized understanding of and has agreed to the management plan. Patient is aware to call the clinic if symptoms persist or worsen. Patient is aware when to return to the clinic for a follow-up visit. Patient educated on when it is appropriate to go to the emergency department.     Dana Allan, MD

## 2023-05-01 ENCOUNTER — Encounter: Payer: Self-pay | Admitting: Family Medicine

## 2023-05-01 NOTE — Assessment & Plan Note (Signed)
Recent onset of cough with greenish-yellow sputum, no fever, no shortness of breath. History of similar symptoms around this time of year, typically requiring antibiotics. -Start Flonase as previously prescribed. -Start Doxycycline only if fever develops or chest congestion worsens. -If Doxycycline is started, also start a probiotic and continue for 14 days after completion of antibiotic course. -Continue hydration.

## 2023-05-04 DIAGNOSIS — H903 Sensorineural hearing loss, bilateral: Secondary | ICD-10-CM | POA: Diagnosis not present

## 2023-05-08 ENCOUNTER — Encounter: Payer: Self-pay | Admitting: Internal Medicine

## 2023-05-09 IMAGING — MR MR LUMBAR SPINE W/O CM
4 of 5 series · 23 of 48 positions shown · non-contrast
Comparison: Previous MRI from 10/05/2020.

CLINICAL DATA: Initial evaluation for moderate to severe lower back
pain radiating to the bilateral sides, worse on the left.

EXAM:
MRI LUMBAR SPINE WITHOUT CONTRAST
TECHNIQUE: Multiplanar, multisequence MR imaging of the lumbar spine was
performed. No intravenous contrast was administered.

[Series 3: T2 · sagittal · 4.0mm · 0.57mm/px · 6 of 17 slices shown (1 of 2)]
[im 1/17]
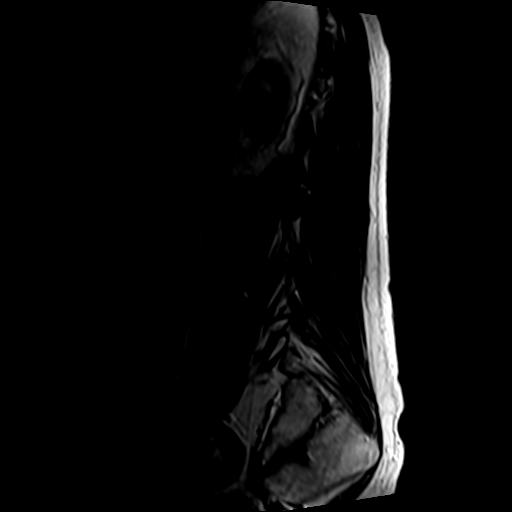
[im 4/17]
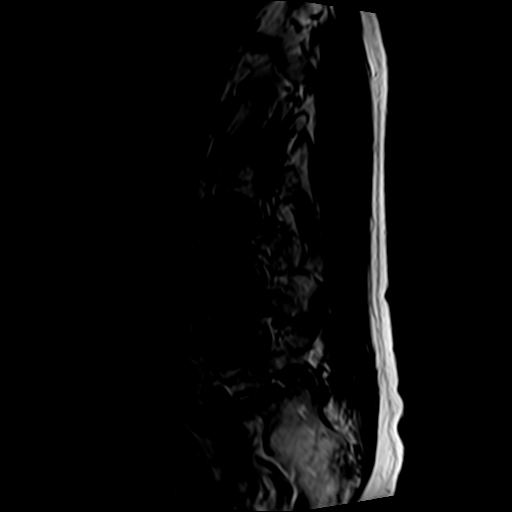
[im 7/17]
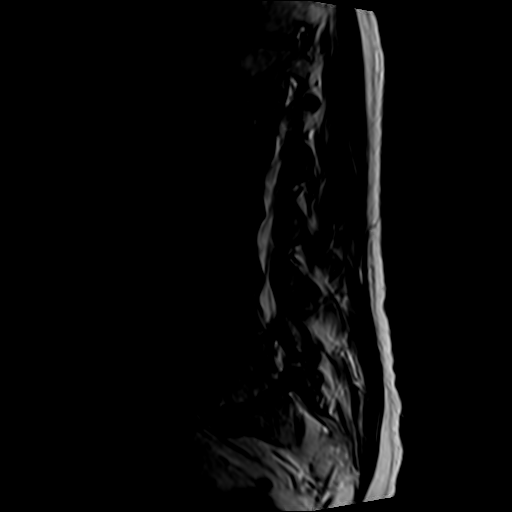
[im 10/17]
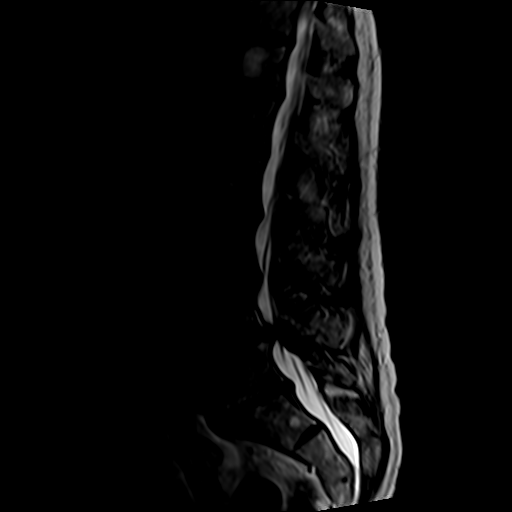
[im 13/17]
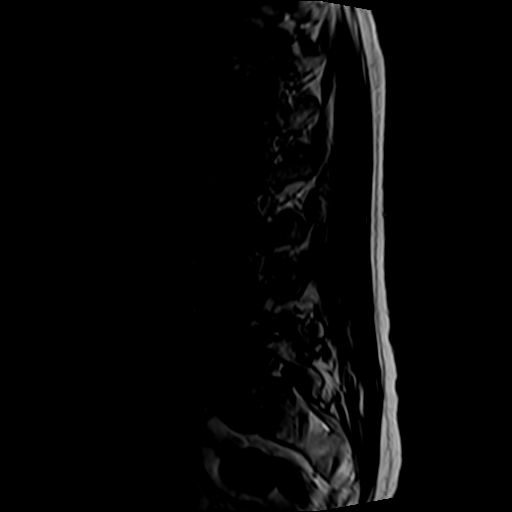
[im 17/17]
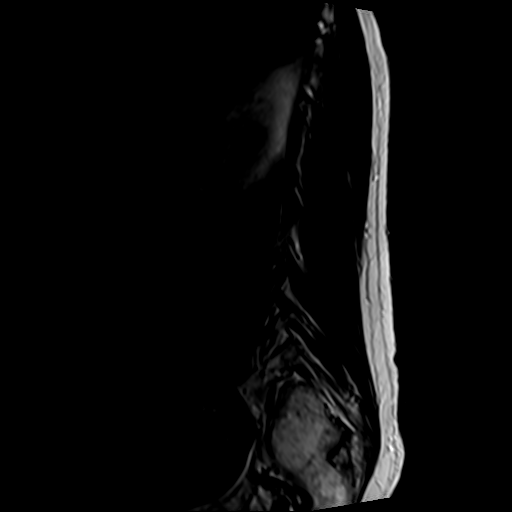

[Series 5: T1 · sagittal · 4.0mm · 0.57mm/px · 5 of 17 slices shown (1 of 2)]
[im 1/17]
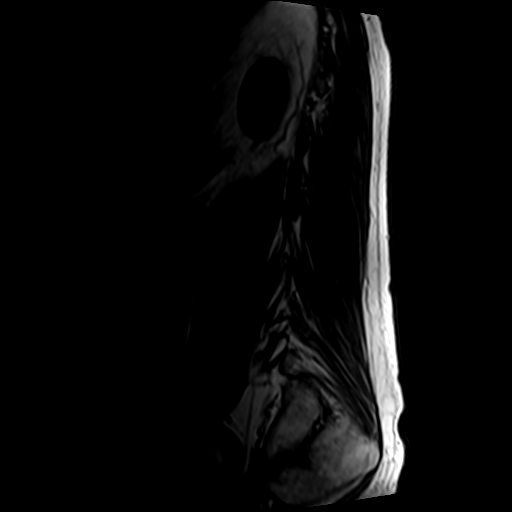
[im 4/17]
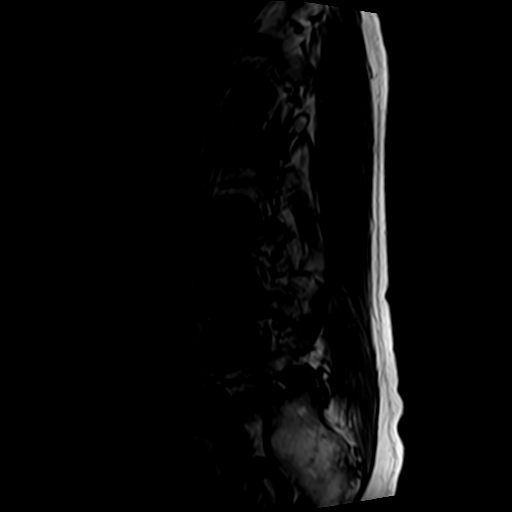
[im 7/17]
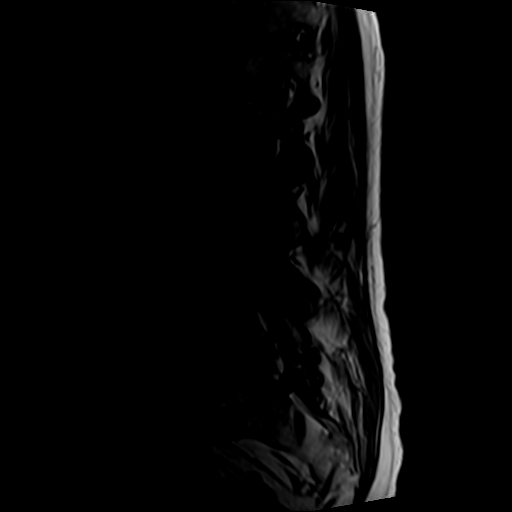
[im 10/17]
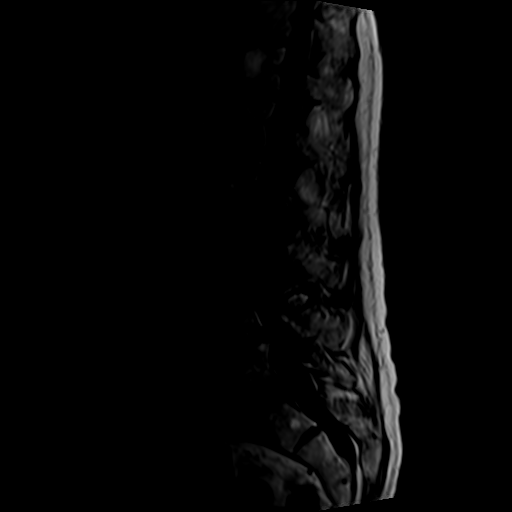
[im 17/17]
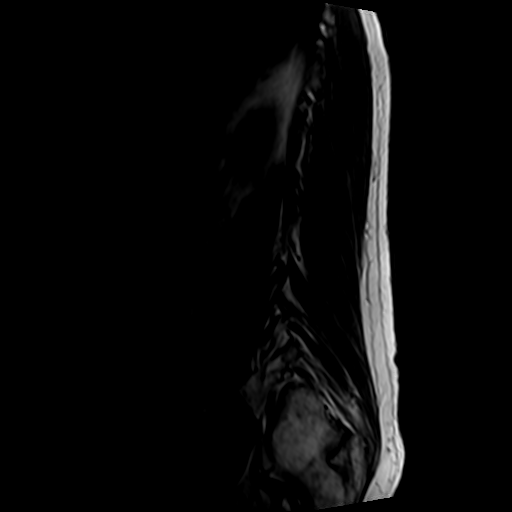

[Series 6: T2 · axial · 4.0mm · 0.70mm/px · z∈[-10,+220]mm · 9 of 40 slices shown (2 of 2)]
[im 1/40]
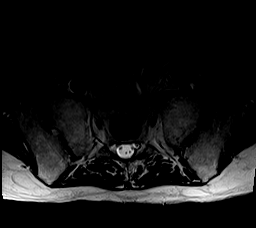
[im 6/40]
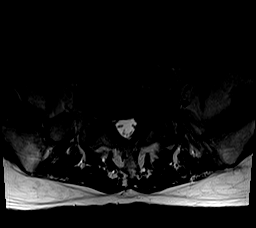
[im 12/40]
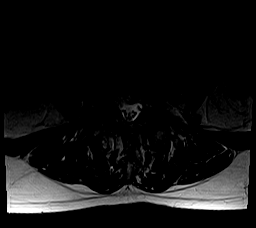
[im 17/40]
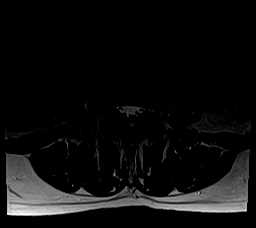
[im 20/40]
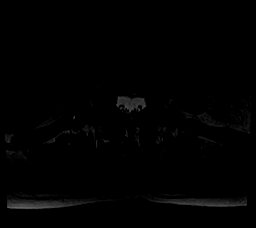
[im 23/40]
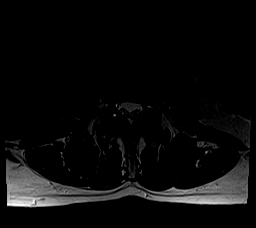
[im 28/40]
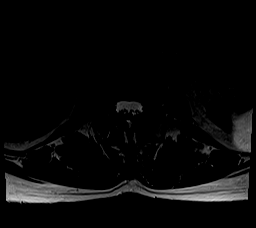
[im 34/40]
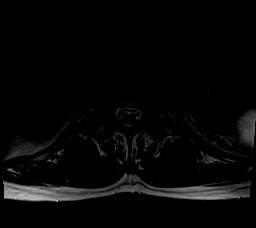
[im 40/40]
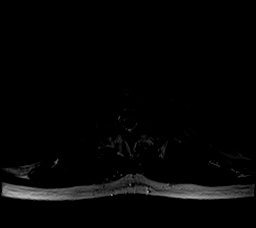

[Series 7: T1 · axial · 4.0mm · 0.35mm/px · z∈[+20,+189]mm · 3 of 40 slices shown (2 of 2)]
[im 6/40]
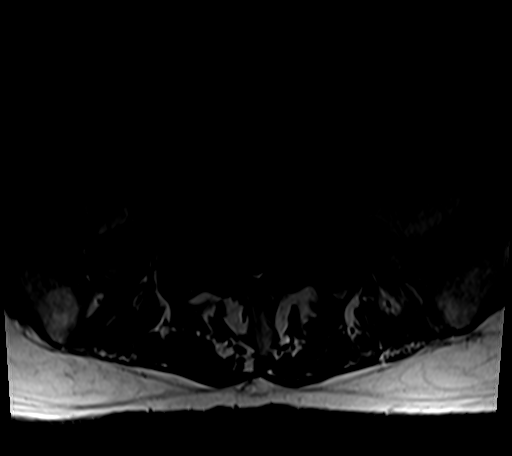
[im 20/40]
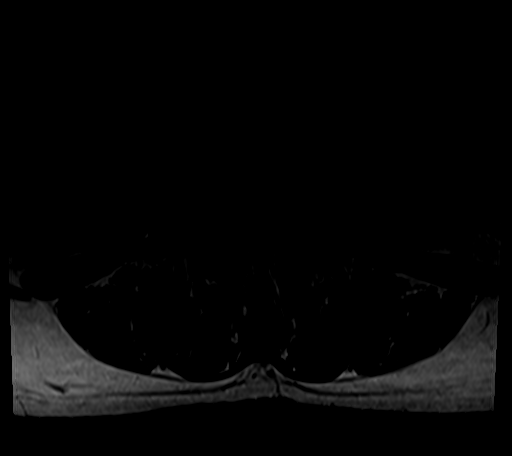
[im 34/40]
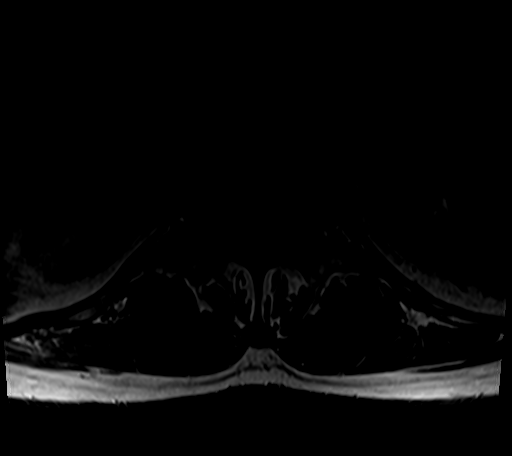

[23 of 48 positions shown; findings below may reference images not displayed]

FINDINGS: Segmentation: Standard. Lowest well-formed disc space labeled the
L5-S1 level.

Alignment: Trace facet mediated retrolisthesis of L2 on L3, with
trace anterolisthesis of L4 on L5, stable. Alignment otherwise
normal with preservation of the normal lumbar lordosis.

Vertebrae: Vertebral body height maintained without acute or
interval fracture. Bone marrow signal intensity mildly heterogeneous
but within normal limits. Few scattered benign hemangiomata noted,
most prominent of which measures 2.1 cm within the T12 vertebral
body. No worrisome osseous lesions. Discogenic reactive endplate
change with mild marrow edema present about the L4-5 and L5-S1
interspaces, slightly progressed at the L5-S1 level. Mild reactive
endplate changes at the superior endplate of L3 as well, similar.

Conus medullaris and cauda equina: Conus extends to the L2 level.
Conus and cauda equina appear normal.

Paraspinal and other soft tissues: Paraspinous soft tissues
demonstrate no acute finding. Multiple scattered T2 hyperintense
cyst noted within the left kidney. 1.3 cm cystic lesion at the upper
pole the right kidney demonstrates some intrinsic T1 hyperintensity,
likely reflecting a proteinaceous and/or hemorrhagic cyst.
Appearance is similar to previous.

Disc levels:

T11-12: Seen only on sagittal projection. Degenerative
intervertebral disc space narrowing with disc desiccation and
diffuse disc bulge. Right greater than left facet hypertrophy. No
significant spinal stenosis. Moderate right with mild left foraminal
narrowing.

T12-L1: Disc desiccation with mild disc bulge. Mild facet
hypertrophy. No significant stenosis.

L1-2: Negative interspace. Moderate bilateral facet hypertrophy with
associated trace joint effusion on the right. No spinal stenosis.
Foramina remain patent. Appearance is stable.

L2-3: Degenerative intervertebral disc space narrowing with disc
desiccation and diffuse disc bulge. Mild reactive endplate spurring.
Moderate facet hypertrophy. No significant spinal stenosis. Foramina
remain patent. Appearance is stable.

L3-4: Disc desiccation with diffuse disc bulge, asymmetric to the
left. Associated mild left-sided reactive endplate spurring.
Moderate facet and ligament flavum hypertrophy. Resultant mild canal
with bilateral lateral recess stenosis. Mild left L3 foraminal
stenosis. Right neural foramina remains patent.

L4-5: Trace anterolisthesis. Degenerative intervertebral disc space
narrowing with disc desiccation and diffuse disc bulge, asymmetric
to the right. Associated discogenic reactive endplate change with
marginal endplate osteophytic spurring, also worse on the right.
Severe bilateral facet arthrosis with associated trace joint
effusion on the right. Resultant moderate canal with right worse
than left lateral recess stenosis. Moderate right with mild left L4
foraminal narrowing. Appearance is similar.

L5-S1: Advanced degenerative intervertebral disc space narrowing
with disc desiccation and diffuse disc bulge. Associated discogenic
reactive endplate change with marginal endplate osteophytic
spurring. Moderate to advanced left worse than right facet
arthrosis. Resultant mild narrowing of the lateral recesses
bilaterally. Central canal remains patent. Mild to moderate right
with moderate left L5 foraminal stenosis. Appearance is stable.
IMPRESSION: 1. Mildly progressive discogenic endplate change about the L5-S1
interspace with associated reactive marrow edema. Finding could
contribute to lower back pain. Otherwise, appearance of the lumbar
spine is little interval changed as compared to 10/05/2020.
2. Degenerative spondylolysis and facet arthrosis at L4-5 and L5-S1
with resultant moderate canal with right worse than left lateral
recess stenosis at the L4-5 level. Associated moderate foraminal
narrowing on the right at L4 and bilaterally at L5.
3. Disc bulging with facet hypertrophy at L3-4 with resultant mild
canal and bilateral lateral recess stenosis.
4. Moderate to advanced multilevel facet arthropathy, most
pronounced at L4-5. Finding could serve as a source for lower back
pain.

## 2023-05-09 NOTE — Telephone Encounter (Signed)
Pt scheduled with Dr Scott 

## 2023-05-10 ENCOUNTER — Ambulatory Visit (INDEPENDENT_AMBULATORY_CARE_PROVIDER_SITE_OTHER): Payer: Medicare HMO | Admitting: Internal Medicine

## 2023-05-10 ENCOUNTER — Encounter: Payer: Self-pay | Admitting: Internal Medicine

## 2023-05-10 VITALS — BP 128/72 | HR 88 | Temp 97.8°F | Resp 16 | Ht 70.0 in | Wt 168.0 lb

## 2023-05-10 DIAGNOSIS — R051 Acute cough: Secondary | ICD-10-CM | POA: Diagnosis not present

## 2023-05-10 DIAGNOSIS — J329 Chronic sinusitis, unspecified: Secondary | ICD-10-CM | POA: Diagnosis not present

## 2023-05-10 MED ORDER — DOXYCYCLINE HYCLATE 100 MG PO TABS: 100.0000 mg | ORAL_TABLET | Freq: Two times a day (BID) | ORAL | 0 refills | Status: DC

## 2023-05-10 MED ORDER — PREDNISONE 10 MG PO TABS
ORAL_TABLET | ORAL | 0 refills | Status: DC
Start: 2023-05-10 — End: 2023-08-03

## 2023-05-10 NOTE — Assessment & Plan Note (Signed)
Persistent cough and congestion as outlined. Increased nasal congestion. Increased cough with forced expiration. Symptoms have improved. Extend out abx - doxycycline. Continue probiotic. Prednisone taper as directed. Saline nasal spray and flonase as directed.  Robitussin DM.  Follow.  Call with update.

## 2023-05-10 NOTE — Patient Instructions (Signed)
Saline nasal spray - flush nose 1-2x/day  Continue flonase - 2 sprays each nostril one time per day  Robitussin DM for cough and congestion.

## 2023-05-10 NOTE — Progress Notes (Signed)
Subjective:    Patient ID: Mike Farley, male    DOB: February 09, 1951, 72 y.o.   MRN: [id number]  Patient here for  Chief Complaint  Patient presents with   Cough   Ear Pain   Chest Congestion    HPI Here for work in appt - work in for cough and congestion. Was evaluated 04/29/23 - Dr Clent Ridges - cough and congestion - productive colored mucus. Treated with doxycycline and flonase. He reports that he is some better. Still with nasal congestion and cough.  Describes a fluttering sensation in his left ear. No pain. No fullness. No increased drainage or sore throat. Still with chest congestion.  Cough. Persistent. No fever. No vomiting. Some diarrhea yesterday. Taking probiotic. Better today. Using flonase and afrin prn.    Past Medical History:  Diagnosis Date   Anxiety    Arthritis    shoulder   Cancer (HCC)    prostate ca - Md just watching   Cervical pain (neck)    limited mobility   Chronic pain    Decreased ROM of neck    Depression    years ago (minor)   Diverticulosis    Duodenal diverticulum    Elevated PSA    Hemorrhoids    Hiatal hernia    History of chicken pox    HLD (hyperlipidemia)    Hx of cold sores    Internal hemorrhoids    Mild cognitive impairment with memory loss    Neuropathy    Past Surgical History:  Procedure Laterality Date   ABDOMINAL EXPOSURE N/A 04/23/2021   Procedure: ABDOMINAL EXPOSURE;  Surgeon: Cephus Shelling, MD;  Location: [facility];  Service: Vascular;  Laterality: N/A;   ANTERIOR FUSION CERVICAL SPINE     4-5 levels; has had 3-4 surgeries for this   ANTERIOR LUMBAR FUSION N/A 04/23/2021   Procedure: Lumbar four-five,  Lumbar five-Sacral one Anterior Lumbar Interbody Fusion;  Surgeon: Dawley, Alan Mulder, DO;  Location: [facility] OR;  Service: Neurosurgery;  Laterality: N/A;   APPENDECTOMY     BACK SURGERY     BACK SURGERY  04/2022   BONE EXCISION Left 09/28/2017   Procedure: BONE EXCISION-TAILORS  EXOSTECTOMY;  Surgeon: Gwyneth Revels, DPM;   Location: [facility];  Service: Podiatry;  Laterality: Left;  IVA LOCAL   CERVICAL DISCECTOMY  2008   C2-C7 Fused    CHOLECYSTECTOMY     COLONOSCOPY  2020   COLONOSCOPY  2019   [facility] Due 03/2022 per patient   ELBOW FRACTURE SURGERY Left 1990   ESOPHAGOGASTRODUODENOSCOPY (EGD) WITH PROPOFOL N/A 10/06/2021   Procedure: ESOPHAGOGASTRODUODENOSCOPY (EGD) WITH PROPOFOL;  Surgeon: Regis Bill, MD;  Location: [facility];  Service: Endoscopy;  Laterality: N/A;   LUMBAR PERCUTANEOUS PEDICLE SCREW 2 LEVEL N/A 04/23/2021   Procedure: LUMBAR PERCUTANEOUS PEDICLE SCREW LUMBAR FOUR-SACRAL ONE;  Surgeon: Bethann Goo, DO;  Location: [facility];  Service: Neurosurgery;  Laterality: N/A;   METATARSAL HEAD EXCISION Left 07/04/2020   Procedure: METATARSAL HEAD EXCISION;  Surgeon: Gwyneth Revels, DPM;  Location: [facility];  Service: Podiatry;  Laterality: Left;   PROSTATE BIOPSY N/A 03/15/2019   Procedure: PROSTATE BIOPSY Uro Nav;  Surgeon: Orson Ape, MD;  Location: [facility];  Service: Urology;  Laterality: N/A;   RADIOLOGY WITH ANESTHESIA N/A 04/22/2020   Procedure: MRI WITH ANESTHESIA BRAIN WITHOUT CONTRAST;  Surgeon: Radiologist, Medication, MD;  Location: [facility];  Service: Radiology;  Laterality: N/A;  REPLACEMENT TOTAL KNEE Right    right knee   ROTATOR CUFF REPAIR Right 2018   with arthroscopy and distal claviculectomy   TONSILLECTOMY AND ADENOIDECTOMY     TOTAL KNEE ARTHROPLASTY Right 2005   x 4   Family History  Problem Relation Age of Onset   Arthritis Mother    Breast cancer Mother    Mental illness Mother    Colon cancer Mother    Bipolar disorder Mother    Depression Mother    Colon polyps Mother    Lung cancer Father    Prostate cancer Father    Alcohol abuse Brother    Kidney disease Brother    Esophageal cancer Neg Hx    Social History   Socioeconomic History   Marital status: Married    Spouse name: Not on file    Number of children: Not on file   Years of education: Not on file   Highest education level: Professional school degree (e.g., MD, DDS, DVM, JD)  Occupational History   Not on file  Tobacco Use   Smoking status: Former   Smokeless tobacco: Former    Types: Chew   Tobacco comments:    Quit 50 yrs ago for smoking and quit chewing 2 months ago  Vaping Use   Vaping status: Never Used  Substance and Sexual Activity   Alcohol use: Yes    Alcohol/week: 7.0 standard drinks of alcohol    Types: 7 Standard drinks or equivalent per week    Comment: beer/wine   Drug use: Never   Sexual activity: Not Currently  Other Topics Concern   Not on file  Social History Narrative   Married   Social Drivers of Health   Financial Resource Strain: Low Risk  (01/17/2023)   Overall Financial Resource Strain (CARDIA)    Difficulty of Paying Living Expenses: Not hard at all  Food Insecurity: No Food Insecurity (01/17/2023)   Hunger Vital Sign    Worried About Running Out of Food in the Last Year: Never true    Ran Out of Food in the Last Year: Never true  Transportation Needs: No Transportation Needs (01/17/2023)   PRAPARE - Administrator, Civil Service (Medical): No    Lack of Transportation (Non-Medical): No  Physical Activity: Sufficiently Active (01/17/2023)   Exercise Vital Sign    Days of Exercise per Week: 4 days    Minutes of Exercise per Session: 40 min  Stress: Stress Concern Present (01/19/2023)   [employer] of Occupational Health - Occupational Stress Questionnaire    Feeling of Stress : Rather much  Social Connections: Socially Integrated (01/17/2023)   Social Connection and Isolation Panel [NHANES]    Frequency of Communication with Friends and Family: More than three times a week    Frequency of Social Gatherings with Friends and Family: More than three times a week    Attends Religious Services: More than 4 times per year    Active Member of [employer] or Organizations:  Yes    Attends Engineer, structural: More than 4 times per year    Marital Status: Married     Review of Systems  Constitutional:  Negative for appetite change and unexpected weight change.  HENT:  Positive for congestion. Negative for sinus pressure and sore throat.        Ear fluttering as outlined.   Respiratory:  Positive for cough. Negative for shortness of breath.        Breathing  and chest tightness better.   Cardiovascular:  Negative for chest pain, palpitations and leg swelling.  Gastrointestinal:  Negative for abdominal pain, nausea and vomiting.  Genitourinary:  Negative for dysuria.  Musculoskeletal:  Negative for joint swelling and myalgias.  Skin:  Negative for color change and rash.  Neurological:  Negative for dizziness and headaches.  Psychiatric/Behavioral:  Negative for agitation and dysphoric mood.        Objective:     BP 128/72   Pulse 88   Temp 97.8 F (36.6 C)   Resp 16   Ht 5\' 10"  (1.778 m)   Wt 168 lb (76.2 kg)   SpO2 99%   BMI 24.11 kg/m  Wt Readings from Last 3 Encounters:  05/10/23 168 lb (76.2 kg)  04/05/23 171 lb 12.8 oz (77.9 kg)  01/19/23 166 lb (75.3 kg)    Physical Exam Vitals reviewed.  Constitutional:      General: He is not in acute distress.    Appearance: Normal appearance. He is well-developed.  HENT:     Head: Normocephalic and atraumatic.     Right Ear: Tympanic membrane, ear canal and external ear normal.     Left Ear: Tympanic membrane, ear canal and external ear normal.  Eyes:     General: No scleral icterus.       Right eye: No discharge.        Left eye: No discharge.     Conjunctiva/sclera: Conjunctivae normal.  Cardiovascular:     Rate and Rhythm: Normal rate and regular rhythm.  Pulmonary:     Effort: Pulmonary effort is normal. No respiratory distress.     Breath sounds: Normal breath sounds.     Comments: Increased cough with forced expiration.  Musculoskeletal:        General: No swelling or  tenderness.     Cervical back: Neck supple. No tenderness.  Lymphadenopathy:     Cervical: No cervical adenopathy.  Skin:    Findings: No erythema or rash.  Neurological:     Mental Status: He is alert.  Psychiatric:        Mood and Affect: Mood normal.        Behavior: Behavior normal.      Outpatient Encounter Medications as of 05/10/2023  Medication Sig   doxycycline (VIBRA-TABS) 100 MG tablet Take 1 tablet (100 mg total) by mouth 2 (two) times daily.   predniSONE (DELTASONE) 10 MG tablet Take 4 tablets x 1 day and then decrease by 1/2 tablet per day until down to zero mg.   cholecalciferol (VITAMIN D3) 25 MCG (1000 UT) tablet Take 1,000 Units by mouth 3 (three) times a week.    cyanocobalamin 1000 MCG tablet Take 1,000 mcg by mouth daily.    donepezil (ARICEPT) 5 MG tablet Take 5 mg by mouth daily.   fluticasone (FLONASE) 50 MCG/ACT nasal spray Place 2 sprays into both nostrils daily.   Melatonin 1 MG CAPS Take by mouth.   Multiple Vitamin (MULTIVITAMIN) tablet Take 1 tablet by mouth daily. ABC Complete MEN   Oxycodone HCl 10 MG TABS 10 mg 4 (four) times daily   pantoprazole (PROTONIX) 20 MG tablet Take 1 tablet (20 mg total) by mouth daily.   QUEtiapine (SEROQUEL) 300 MG tablet Take 1/2 tablet q hs   rosuvastatin (CRESTOR) 10 MG tablet TAKE 1 TABLET BY MOUTH DAILY   saccharomyces boulardii (FLORASTOR) 250 MG capsule Take 1 capsule (250 mg total) by mouth daily.   tadalafil (CIALIS) 5  MG tablet Take 5 mg by mouth daily.   tamsulosin (FLOMAX) 0.4 MG CAPS capsule Take 0.4 mg by mouth.   valACYclovir (VALTREX) 1000 MG tablet TAKE ONE (1) TABLET BY MOUTH TWO TIMES PER DAY. TAKE FOR ONE DAY AT ONSET OF BREAKOUT.   [DISCONTINUED] ondansetron (ZOFRAN) 4 MG tablet Take 4 mg by mouth every 8 (eight) hours as needed for nausea or vomiting. (Patient not taking: Reported on 04/29/2023)   No facility-administered encounter medications on file as of 05/10/2023.     Lab Results  Component  Value Date   WBC 4.8 04/01/2023   HGB 12.5 (L) 04/01/2023   HCT 36.7 (L) 04/01/2023   PLT 206.0 04/01/2023   GLUCOSE 99 04/01/2023   CHOL 167 04/01/2023   TRIG 99.0 04/01/2023   HDL 73.80 04/01/2023   LDLCALC 73 04/01/2023   ALT 16 04/01/2023   AST 25 04/01/2023   NA 141 04/01/2023   K 4.4 04/01/2023   CL 103 04/01/2023   CREATININE 0.91 04/01/2023   BUN 24 (H) 04/01/2023   CO2 32 04/01/2023   TSH 3.61 07/14/2022   PSA 16.07 (H) 09/29/2022   INR 1.03 05/29/2009   HGBA1C 5.7 04/01/2023    NM GASTRIC EMPTYING Result Date: 11/13/2021 CLINICAL DATA:  Chronic nausea and vomiting. EXAM: NUCLEAR MEDICINE GASTRIC EMPTYING SCAN TECHNIQUE: After oral ingestion of radiolabeled meal, sequential abdominal images were obtained for 4 hours. Percentage of activity emptying the stomach was calculated at 1 hour, 2 hour, 3 hour, and 4 hours. RADIOPHARMACEUTICALS:  2.16 mCi Tc-27m sulfur colloid in standardized meal COMPARISON:  CT August 26, 2021 FINDINGS: Expected location of the stomach in the left upper quadrant. Ingested meal empties the stomach gradually over the course of the study. 60% emptied at 1 hr ( normal >= 10%) 97% emptied at 2 hr ( normal >= 40%) 98% emptied at 3 hr ( normal >= 70%) 99% emptied at 4 hr ( normal >= 90%) IMPRESSION: No scintigraphic evidence of delayed gastric emptying. Electronically Signed   By: Maudry Mayhew M.D.   On: 11/13/2021 15:17       Assessment & Plan:  Acute cough Assessment & Plan: Persistent cough and congestion as outlined. Increased nasal congestion. Increased cough with forced expiration. Symptoms have improved. Extend out abx - doxycycline. Continue probiotic. Prednisone taper as directed. Saline nasal spray and flonase as directed.  Robitussin DM.  Follow.  Call with update.    Recurrent sinusitis  Other orders -     predniSONE; Take 4 tablets x 1 day and then decrease by 1/2 tablet per day until down to zero mg.  Dispense: 18 tablet; Refill: 0 -      Doxycycline Hyclate; Take 1 tablet (100 mg total) by mouth 2 (two) times daily.  Dispense: 14 tablet; Refill: 0     Dale [place], MD

## 2023-05-16 ENCOUNTER — Telehealth: Payer: Self-pay

## 2023-05-16 NOTE — Telephone Encounter (Signed)
Copied from CRM [id number]. Topic: Compliment - Provider (non-sensitive) >> May 13, 2023  5:06 PM Hector Shade B wrote: Date of encounter: 12/17/ Details of compliment: Patient called in and wanted to thank Dr. Lorin Picket for all that she does and let her know that he was feeling much better and to also wish her Happy Holidays Who would the patient like to thank/see rewarded? Dr. Dale [place] On a scale of 1-10, how was your experience? 10  Route to Research officer, political party.

## 2023-05-16 NOTE — Telephone Encounter (Signed)
Noted  

## 2023-05-31 ENCOUNTER — Other Ambulatory Visit (INDEPENDENT_AMBULATORY_CARE_PROVIDER_SITE_OTHER): Payer: Medicare HMO

## 2023-05-31 DIAGNOSIS — D649 Anemia, unspecified: Secondary | ICD-10-CM | POA: Diagnosis not present

## 2023-05-31 LAB — CBC WITH DIFFERENTIAL/PLATELET
Basophils Absolute: 0 10*3/uL (ref 0.0–0.1)
Basophils Relative: 0.4 % (ref 0.0–3.0)
Eosinophils Absolute: 0.1 10*3/uL (ref 0.0–0.7)
Eosinophils Relative: 1.2 % (ref 0.0–5.0)
HCT: 39.2 % (ref 39.0–52.0)
Hemoglobin: 13 g/dL (ref 13.0–17.0)
Lymphocytes Relative: 21.1 % (ref 12.0–46.0)
Lymphs Abs: 1.4 10*3/uL (ref 0.7–4.0)
MCHC: 33.1 g/dL (ref 30.0–36.0)
MCV: 95 fL (ref 78.0–100.0)
Monocytes Absolute: 0.4 10*3/uL (ref 0.1–1.0)
Monocytes Relative: 6.7 % (ref 3.0–12.0)
Neutro Abs: 4.5 10*3/uL (ref 1.4–7.7)
Neutrophils Relative %: 70.6 % (ref 43.0–77.0)
Platelets: 254 10*3/uL (ref 150.0–400.0)
RBC: 4.13 Mil/uL — ABNORMAL LOW (ref 4.22–5.81)
RDW: 13.7 % (ref 11.5–15.5)
WBC: 6.4 10*3/uL (ref 4.0–10.5)

## 2023-05-31 LAB — VITAMIN B12: Vitamin B-12: 1094 pg/mL — ABNORMAL HIGH (ref 211–911)

## 2023-05-31 LAB — IBC + FERRITIN
Ferritin: 84.1 ng/mL (ref 22.0–322.0)
Iron: 136 ug/dL (ref 42–165)
Saturation Ratios: 42.4 % (ref 20.0–50.0)
TIBC: 320.6 ug/dL (ref 250.0–450.0)
Transferrin: 229 mg/dL (ref 212.0–360.0)

## 2023-06-01 ENCOUNTER — Encounter: Payer: Self-pay | Admitting: Internal Medicine

## 2023-06-07 DIAGNOSIS — G894 Chronic pain syndrome: Secondary | ICD-10-CM | POA: Diagnosis not present

## 2023-06-07 DIAGNOSIS — M4802 Spinal stenosis, cervical region: Secondary | ICD-10-CM | POA: Diagnosis not present

## 2023-06-07 DIAGNOSIS — M4721 Other spondylosis with radiculopathy, occipito-atlanto-axial region: Secondary | ICD-10-CM | POA: Diagnosis not present

## 2023-06-07 DIAGNOSIS — M47812 Spondylosis without myelopathy or radiculopathy, cervical region: Secondary | ICD-10-CM | POA: Diagnosis not present

## 2023-06-08 ENCOUNTER — Encounter: Payer: Self-pay | Admitting: Internal Medicine

## 2023-06-08 MED ORDER — QUETIAPINE FUMARATE 300 MG PO TABS: ORAL_TABLET | ORAL | 0 refills | Status: DC

## 2023-06-08 NOTE — Telephone Encounter (Signed)
 Called [facility] pharmacy. He picked up 45 tablets on 04/29/23. It is written for 1/2 tablet at bedtime. He has been taking 1 tablet at bedtime. Pharmacist at [facility] says he has a refill but he is over a month early so insurance is not covering. Pt is wanting us  to send in rx with new directions for 1 tablet at bedtime.

## 2023-06-08 NOTE — Telephone Encounter (Signed)
 Pt has been taking seroquel  one per day. Prior to this rx, it was prescribed 1/2 - 1 tablet q hs. Rx changed to reflect above and sent in [facility].

## 2023-07-25 ENCOUNTER — Telehealth: Payer: Self-pay | Admitting: Internal Medicine

## 2023-07-25 DIAGNOSIS — E7800 Pure hypercholesterolemia, unspecified: Secondary | ICD-10-CM

## 2023-07-25 DIAGNOSIS — R739 Hyperglycemia, unspecified: Secondary | ICD-10-CM

## 2023-07-25 DIAGNOSIS — D649 Anemia, unspecified: Secondary | ICD-10-CM

## 2023-07-25 NOTE — Telephone Encounter (Signed)
 Patient need lab orders.

## 2023-07-27 ENCOUNTER — Encounter: Payer: Self-pay | Admitting: Internal Medicine

## 2023-07-28 ENCOUNTER — Other Ambulatory Visit: Payer: Medicare HMO

## 2023-07-28 DIAGNOSIS — G894 Chronic pain syndrome: Secondary | ICD-10-CM | POA: Diagnosis not present

## 2023-07-28 DIAGNOSIS — M4802 Spinal stenosis, cervical region: Secondary | ICD-10-CM | POA: Diagnosis not present

## 2023-07-28 DIAGNOSIS — M47812 Spondylosis without myelopathy or radiculopathy, cervical region: Secondary | ICD-10-CM | POA: Diagnosis not present

## 2023-07-28 DIAGNOSIS — M4721 Other spondylosis with radiculopathy, occipito-atlanto-axial region: Secondary | ICD-10-CM | POA: Diagnosis not present

## 2023-07-28 NOTE — Telephone Encounter (Signed)
 Appt rescheduled

## 2023-07-31 ENCOUNTER — Encounter: Payer: Self-pay | Admitting: Internal Medicine

## 2023-08-03 ENCOUNTER — Encounter: Payer: Self-pay | Admitting: Internal Medicine

## 2023-08-03 ENCOUNTER — Ambulatory Visit: Payer: Medicare HMO | Admitting: Internal Medicine

## 2023-08-03 VITALS — BP 124/70 | HR 86 | Temp 98.0°F | Resp 16 | Ht 70.0 in | Wt 165.6 lb

## 2023-08-03 DIAGNOSIS — Z0000 Encounter for general adult medical examination without abnormal findings: Secondary | ICD-10-CM | POA: Diagnosis not present

## 2023-08-03 DIAGNOSIS — E559 Vitamin D deficiency, unspecified: Secondary | ICD-10-CM

## 2023-08-03 DIAGNOSIS — Z79899 Other long term (current) drug therapy: Secondary | ICD-10-CM | POA: Diagnosis not present

## 2023-08-03 DIAGNOSIS — E7800 Pure hypercholesterolemia, unspecified: Secondary | ICD-10-CM

## 2023-08-03 DIAGNOSIS — I700 Atherosclerosis of aorta: Secondary | ICD-10-CM

## 2023-08-03 DIAGNOSIS — C61 Malignant neoplasm of prostate: Secondary | ICD-10-CM

## 2023-08-03 DIAGNOSIS — G3184 Mild cognitive impairment, so stated: Secondary | ICD-10-CM

## 2023-08-03 DIAGNOSIS — D649 Anemia, unspecified: Secondary | ICD-10-CM | POA: Diagnosis not present

## 2023-08-03 DIAGNOSIS — R739 Hyperglycemia, unspecified: Secondary | ICD-10-CM

## 2023-08-03 DIAGNOSIS — F320 Major depressive disorder, single episode, mild: Secondary | ICD-10-CM

## 2023-08-03 DIAGNOSIS — F32A Depression, unspecified: Secondary | ICD-10-CM

## 2023-08-03 DIAGNOSIS — R5383 Other fatigue: Secondary | ICD-10-CM

## 2023-08-03 LAB — BASIC METABOLIC PANEL
BUN: 22 mg/dL (ref 6–23)
CO2: 29 meq/L (ref 19–32)
Calcium: 9.4 mg/dL (ref 8.4–10.5)
Chloride: 100 meq/L (ref 96–112)
Creatinine, Ser: 0.75 mg/dL (ref 0.40–1.50)
GFR: 89.74 mL/min (ref >60.00)
Glucose, Bld: 106 mg/dL — ABNORMAL HIGH (ref 70–99)
Potassium: 4.2 meq/L (ref 3.5–5.1)
Sodium: 136 meq/L (ref 135–145)

## 2023-08-03 LAB — LIPID PANEL
Cholesterol: 171 mg/dL (ref 0–200)
HDL: 94.7 mg/dL (ref >39.00)
LDL Cholesterol: 54 mg/dL (ref 0–99)
NonHDL: 76.4
Total CHOL/HDL Ratio: 2
Triglycerides: 110 mg/dL (ref 0.0–149.0)
VLDL: 22 mg/dL (ref 0.0–40.0)

## 2023-08-03 LAB — HEMOGLOBIN A1C: Hgb A1c MFr Bld: 5.6 % (ref 4.6–6.5)

## 2023-08-03 LAB — HEPATIC FUNCTION PANEL
ALT: 15 U/L (ref 0–53)
AST: 23 U/L (ref 0–37)
Albumin: 4.6 g/dL (ref 3.5–5.2)
Alkaline Phosphatase: 107 U/L (ref 39–117)
Bilirubin, Direct: 0.1 mg/dL (ref 0.0–0.3)
Total Bilirubin: 0.4 mg/dL (ref 0.2–1.2)
Total Protein: 7.1 g/dL (ref 6.0–8.3)

## 2023-08-03 LAB — VITAMIN D 25 HYDROXY (VIT D DEFICIENCY, FRACTURES): VITD: 35.68 ng/mL (ref 30.00–100.00)

## 2023-08-03 LAB — TSH: TSH: 2.04 u[IU]/mL (ref 0.35–5.50)

## 2023-08-03 NOTE — Assessment & Plan Note (Signed)
 Physical today 08/03/23.  Colonoscopy 06/30/22.  PSA - being followed by oncology.

## 2023-08-03 NOTE — Assessment & Plan Note (Signed)
 Continue crestor.  Low cholesterol diet and exercise.  Follow lipid panel and liver function tests.

## 2023-08-03 NOTE — Progress Notes (Signed)
 Subjective:    Patient ID: Mike Farley, male    DOB: 1951-05-11, 73 y.o.   MRN: [id number]  Patient here for  Chief Complaint  Patient presents with   Annual Exam    HPI Here for a physical exam. Seeing neurology for [facility] - on aricept.  He is trying to stay active. Seeing oncology for f/u prostate cancer. Last seen 04/18/23. Having some hot flashes. Some decreased energy. Recently moved. Overall feels he is handling stress relatively well. No chest pain or sob reported. No cough or congestion. No abdominal pain or bowel change. Reports increased "gag reflex". Reports no actual dysphagia. Eating ok. Does not occur while eating. No increased acid reflux reported. Discussed further w/up. Wants to monitor.    Past Medical History:  Diagnosis Date   Anxiety    Arthritis    shoulder   Cancer (HCC)    prostate ca - Md just watching   Cervical pain (neck)    limited mobility   Chronic pain    Decreased ROM of neck    Depression    years ago (minor)   Diverticulosis    Duodenal diverticulum    Elevated PSA    Hemorrhoids    Hiatal hernia    History of chicken pox    HLD (hyperlipidemia)    Hx of cold sores    Internal hemorrhoids    Mild cognitive impairment with memory loss    Neuropathy    Past Surgical History:  Procedure Laterality Date   ABDOMINAL EXPOSURE N/A 04/23/2021   Procedure: ABDOMINAL EXPOSURE;  Surgeon: Cephus Shelling, MD;  Location: [facility] OR;  Service: Vascular;  Laterality: N/A;   ANTERIOR FUSION CERVICAL SPINE     4-5 levels; has had 3-4 surgeries for this   ANTERIOR LUMBAR FUSION N/A 04/23/2021   Procedure: Lumbar four-five,  Lumbar five-Sacral one Anterior Lumbar Interbody Fusion;  Surgeon: Dawley, Alan Mulder, DO;  Location: [facility] OR;  Service: Neurosurgery;  Laterality: N/A;   APPENDECTOMY     BACK SURGERY     BACK SURGERY  04/2022   BONE EXCISION Left 09/28/2017   Procedure: BONE EXCISION-TAILORS  EXOSTECTOMY;  Surgeon: Gwyneth Revels, DPM;   Location: [facility];  Service: Podiatry;  Laterality: Left;  IVA LOCAL   CERVICAL DISCECTOMY  2008   C2-C7 Fused    CHOLECYSTECTOMY     COLONOSCOPY  2020   COLONOSCOPY  2019   [facility] Due 03/2022 per patient   ELBOW FRACTURE SURGERY Left 1990   ESOPHAGOGASTRODUODENOSCOPY (EGD) WITH PROPOFOL N/A 10/06/2021   Procedure: ESOPHAGOGASTRODUODENOSCOPY (EGD) WITH PROPOFOL;  Surgeon: Regis Bill, MD;  Location: [facility];  Service: Endoscopy;  Laterality: N/A;   LUMBAR PERCUTANEOUS PEDICLE SCREW 2 LEVEL N/A 04/23/2021   Procedure: LUMBAR PERCUTANEOUS PEDICLE SCREW LUMBAR FOUR-SACRAL ONE;  Surgeon: Bethann Goo, DO;  Location: [facility] OR;  Service: Neurosurgery;  Laterality: N/A;   METATARSAL HEAD EXCISION Left 07/04/2020   Procedure: METATARSAL HEAD EXCISION;  Surgeon: Gwyneth Revels, DPM;  Location: [facility];  Service: Podiatry;  Laterality: Left;   PROSTATE BIOPSY N/A 03/15/2019   Procedure: PROSTATE BIOPSY Uro Nav;  Surgeon: Orson Ape, MD;  Location: [facility];  Service: Urology;  Laterality: N/A;   RADIOLOGY WITH ANESTHESIA N/A 04/22/2020   Procedure: MRI WITH ANESTHESIA BRAIN WITHOUT CONTRAST;  Surgeon: Radiologist, Medication, MD;  Location: [facility] OR;  Service: Radiology;  Laterality: N/A;   REPLACEMENT TOTAL KNEE Right  right knee   ROTATOR CUFF REPAIR Right 2018   with arthroscopy and distal claviculectomy   TONSILLECTOMY AND ADENOIDECTOMY     TOTAL KNEE ARTHROPLASTY Right 2005   x 4   Family History  Problem Relation Age of Onset   Arthritis Mother    Breast cancer Mother    Mental illness Mother    Colon cancer Mother    Bipolar disorder Mother    Depression Mother    Colon polyps Mother    Lung cancer Father    Prostate cancer Father    Alcohol abuse Brother    Kidney disease Brother    Esophageal cancer Neg Hx    Social History   Socioeconomic History   Marital status: Married    Spouse name: Not on file    Number of children: Not on file   Years of education: Not on file   Highest education level: Professional school degree (e.g., MD, DDS, DVM, JD)  Occupational History   Not on file  Tobacco Use   Smoking status: Former   Smokeless tobacco: Former    Types: Chew   Tobacco comments:    Quit 50 yrs ago for smoking and quit chewing 2 months ago  Vaping Use   Vaping status: Never Used  Substance and Sexual Activity   Alcohol use: Yes    Alcohol/week: 7.0 standard drinks of alcohol    Types: 7 Standard drinks or equivalent per week    Comment: beer/wine   Drug use: Never   Sexual activity: Not Currently  Other Topics Concern   Not on file  Social History Narrative   Married   Social Drivers of Health   Financial Resource Strain: Low Risk  (01/17/2023)   Overall Financial Resource Strain (CARDIA)    Difficulty of Paying Living Expenses: Not hard at all  Food Insecurity: No Food Insecurity (01/17/2023)   Hunger Vital Sign    Worried About Running Out of Food in the Last Year: Never true    Ran Out of Food in the Last Year: Never true  Transportation Needs: No Transportation Needs (01/17/2023)   PRAPARE - Administrator, Civil Service (Medical): No    Lack of Transportation (Non-Medical): No  Physical Activity: Sufficiently Active (01/17/2023)   Exercise Vital Sign    Days of Exercise per Week: 4 days    Minutes of Exercise per Session: 40 min  Stress: Stress Concern Present (01/19/2023)   [employer] of Occupational Health - Occupational Stress Questionnaire    Feeling of Stress : Rather much  Social Connections: Socially Integrated (01/17/2023)   Social Connection and Isolation Panel [NHANES]    Frequency of Communication with Friends and Family: More than three times a week    Frequency of Social Gatherings with Friends and Family: More than three times a week    Attends Religious Services: More than 4 times per year    Active Member of [employer] or Organizations:  Yes    Attends Engineer, structural: More than 4 times per year    Marital Status: Married     Review of Systems  Constitutional:  Negative for appetite change and unexpected weight change.  HENT:  Negative for congestion, sinus pressure and sore throat.   Eyes:  Negative for pain and visual disturbance.  Respiratory:  Negative for cough, chest tightness and shortness of breath.   Cardiovascular:  Negative for chest pain, palpitations and leg swelling.  Gastrointestinal:  Negative for abdominal  pain, diarrhea, nausea and vomiting.  Genitourinary:  Negative for difficulty urinating and dysuria.  Musculoskeletal:  Negative for joint swelling and myalgias.  Skin:  Negative for color change and rash.  Neurological:  Negative for dizziness and headaches.  Hematological:  Negative for adenopathy. Does not bruise/bleed easily.  Psychiatric/Behavioral:  Negative for agitation and dysphoric mood.        Objective:     BP 124/70   Pulse 86   Temp 98 F (36.7 C)   Resp 16   Ht 5\' 10"  (1.778 m)   Wt 165 lb 9.6 oz (75.1 kg)   SpO2 99%   BMI 23.76 kg/m  Wt Readings from Last 3 Encounters:  08/03/23 165 lb 9.6 oz (75.1 kg)  05/10/23 168 lb (76.2 kg)  04/05/23 171 lb 12.8 oz (77.9 kg)    Physical Exam Constitutional:      General: He is not in acute distress.    Appearance: Normal appearance. He is well-developed.  HENT:     Head: Normocephalic and atraumatic.     Right Ear: External ear normal.     Left Ear: External ear normal.     Mouth/Throat:     Pharynx: No oropharyngeal exudate or posterior oropharyngeal erythema.  Eyes:     General: No scleral icterus.       Right eye: No discharge.        Left eye: No discharge.     Conjunctiva/sclera: Conjunctivae normal.  Neck:     Thyroid: No thyromegaly.  Cardiovascular:     Rate and Rhythm: Normal rate and regular rhythm.  Pulmonary:     Effort: No respiratory distress.     Breath sounds: Normal breath sounds. No  wheezing.  Abdominal:     General: Bowel sounds are normal.     Palpations: Abdomen is soft.     Tenderness: There is no abdominal tenderness.  Musculoskeletal:        General: No swelling or tenderness.     Cervical back: Neck supple. No tenderness.  Lymphadenopathy:     Cervical: No cervical adenopathy.  Skin:    Findings: No erythema or rash.  Neurological:     Mental Status: He is alert and oriented to person, place, and time.  Psychiatric:        Mood and Affect: Mood normal.        Behavior: Behavior normal.         Outpatient Encounter Medications as of 08/03/2023  Medication Sig   acetaminophen (TYLENOL) 325 MG tablet Take 325 mg by mouth 4 (four) times daily.   methocarbamol (ROBAXIN) 500 MG tablet Take 500 mg by mouth 3 (three) times daily.   cholecalciferol (VITAMIN D3) 25 MCG (1000 UT) tablet Take 1,000 Units by mouth 3 (three) times a week.    cyanocobalamin 1000 MCG tablet Take 1,000 mcg by mouth daily.    donepezil (ARICEPT) 5 MG tablet Take 5 mg by mouth daily.   fluticasone (FLONASE) 50 MCG/ACT nasal spray Place 2 sprays into both nostrils daily.   Melatonin 10 MG TABS Take by mouth.   Multiple Vitamin (MULTIVITAMIN) tablet Take 1 tablet by mouth daily. ABC Complete MEN   Oxycodone HCl 10 MG TABS 10 mg 4 (four) times daily   pantoprazole (PROTONIX) 20 MG tablet Take 1 tablet (20 mg total) by mouth daily.   QUEtiapine (SEROQUEL) 300 MG tablet Take 1/2 -1 tablet q hs   rosuvastatin (CRESTOR) 10 MG tablet TAKE 1 TABLET BY  MOUTH DAILY   tadalafil (CIALIS) 5 MG tablet Take 5 mg by mouth daily.   tamsulosin (FLOMAX) 0.4 MG CAPS capsule Take 0.4 mg by mouth.   valACYclovir (VALTREX) 1000 MG tablet TAKE ONE (1) TABLET BY MOUTH TWO TIMES PER DAY. TAKE FOR ONE DAY AT ONSET OF BREAKOUT.   [DISCONTINUED] doxycycline (VIBRA-TABS) 100 MG tablet Take 1 tablet (100 mg total) by mouth 2 (two) times daily.   [DISCONTINUED] predniSONE (DELTASONE) 10 MG tablet Take 4 tablets x 1  day and then decrease by 1/2 tablet per day until down to zero mg.   [DISCONTINUED] saccharomyces boulardii (FLORASTOR) 250 MG capsule Take 1 capsule (250 mg total) by mouth daily.   No facility-administered encounter medications on file as of 08/03/2023.     Lab Results  Component Value Date   WBC 6.4 05/31/2023   HGB 13.0 05/31/2023   HCT 39.2 05/31/2023   PLT 254.0 05/31/2023   GLUCOSE 106 (H) 08/03/2023   CHOL 171 08/03/2023   TRIG 110.0 08/03/2023   HDL 94.70 08/03/2023   LDLCALC 54 08/03/2023   ALT 15 08/03/2023   AST 23 08/03/2023   NA 136 08/03/2023   K 4.2 08/03/2023   CL 100 08/03/2023   CREATININE 0.75 08/03/2023   BUN 22 08/03/2023   CO2 29 08/03/2023   TSH 2.04 08/03/2023   PSA 16.07 (H) 09/29/2022   INR 1.03 05/29/2009   HGBA1C 5.6 08/03/2023    NM GASTRIC EMPTYING Result Date: 11/13/2021 CLINICAL DATA:  Chronic nausea and vomiting. EXAM: NUCLEAR MEDICINE GASTRIC EMPTYING SCAN TECHNIQUE: After oral ingestion of radiolabeled meal, sequential abdominal images were obtained for 4 hours. Percentage of activity emptying the stomach was calculated at 1 hour, 2 hour, 3 hour, and 4 hours. RADIOPHARMACEUTICALS:  2.16 mCi Tc-58m sulfur colloid in standardized meal COMPARISON:  CT August 26, 2021 FINDINGS: Expected location of the stomach in the left upper quadrant. Ingested meal empties the stomach gradually over the course of the study. 60% emptied at 1 hr ( normal >= 10%) 97% emptied at 2 hr ( normal >= 40%) 98% emptied at 3 hr ( normal >= 70%) 99% emptied at 4 hr ( normal >= 90%) IMPRESSION: No scintigraphic evidence of delayed gastric emptying. Electronically Signed   By: Maudry Mayhew M.D.   On: 11/13/2021 15:17       Assessment & Plan:  Routine general medical examination at a health care facility  Health care maintenance Assessment & Plan: Physical today 08/03/23.  Colonoscopy 06/30/22.  PSA - being followed by oncology.    Hypercholesteremia Assessment &  Plan: Continue crestor.  Low cholesterol diet and exercise.  Follow lipid panel and liver function tests.  Orders: -     Hepatic function panel -     Lipid panel  High risk medication use -     EKG 12-Lead  Anemia, unspecified type Assessment & Plan: Cbc has been followed by oncology. Will check cbc today.   Orders: -     TSH -     Basic metabolic panel  Hyperglycemia Assessment & Plan: Low carb diet and exercise. Follow met b and A1c.   Orders: -     Hemoglobin A1c  Vitamin D insufficiency Assessment & Plan: History of vitamin D deficiency. Check vitamin D level today.   Orders: -     VITAMIN D 25 Hydroxy (Vit-D Deficiency, Fractures)  Prostate cancer [facility]) Assessment & Plan: Being followed by oncology. Under ongoing surveillance.    Mild  depression Assessment & Plan: Continue seroquel. EKG - today OK. Contiinue current medication regimen. Appears to be doing relatively well. Follow.    Mild cognitive impairment Assessment & Plan: Saw neurology. Continue aricept. Follow.    Major depressive disorder, single episode, mild (HCC) Assessment & Plan: Continue seroquel. No changes. EKG OK.    Aortic atherosclerosis (HCC) Assessment & Plan: Continue crestor.    Other fatigue Assessment & Plan: Increased fatigue. Feel related to recent prostate cancer treatment. Discussed. Will check routine labs as outlined.  Follow.       Dale La Marque, MD

## 2023-08-04 ENCOUNTER — Encounter: Payer: Self-pay | Admitting: Internal Medicine

## 2023-08-06 ENCOUNTER — Encounter: Payer: Self-pay | Admitting: Internal Medicine

## 2023-08-06 DIAGNOSIS — R5383 Other fatigue: Secondary | ICD-10-CM | POA: Insufficient documentation

## 2023-08-06 NOTE — Assessment & Plan Note (Signed)
 Increased fatigue. Feel related to recent prostate cancer treatment. Discussed. Will check routine labs as outlined.  Follow.

## 2023-08-06 NOTE — Assessment & Plan Note (Signed)
 Low-carb diet and exercise.  Follow met b and A1c.

## 2023-08-06 NOTE — Assessment & Plan Note (Signed)
 Cbc has been followed by oncology. Will check cbc today.

## 2023-08-06 NOTE — Assessment & Plan Note (Signed)
 Saw neurology. Continue aricept. Follow.

## 2023-08-06 NOTE — Assessment & Plan Note (Signed)
 Continue crestor

## 2023-08-06 NOTE — Assessment & Plan Note (Signed)
 Continue seroquel. EKG - today OK. Contiinue current medication regimen. Appears to be doing relatively well. Follow.

## 2023-08-06 NOTE — Assessment & Plan Note (Signed)
 Continue seroquel. No changes. EKG OK.

## 2023-08-06 NOTE — Assessment & Plan Note (Signed)
 History of vitamin D deficiency. Check vitamin D level today.

## 2023-08-06 NOTE — Assessment & Plan Note (Signed)
 Being followed by oncology. Under ongoing surveillance.

## 2023-08-21 ENCOUNTER — Encounter: Payer: Self-pay | Admitting: Internal Medicine

## 2023-08-22 ENCOUNTER — Encounter: Payer: Self-pay | Admitting: Internal Medicine

## 2023-08-22 ENCOUNTER — Ambulatory Visit (INDEPENDENT_AMBULATORY_CARE_PROVIDER_SITE_OTHER): Admitting: Internal Medicine

## 2023-08-22 ENCOUNTER — Other Ambulatory Visit

## 2023-08-22 VITALS — BP 120/74 | HR 88 | Ht 70.0 in | Wt 164.6 lb

## 2023-08-22 DIAGNOSIS — R21 Rash and other nonspecific skin eruption: Secondary | ICD-10-CM | POA: Diagnosis not present

## 2023-08-22 NOTE — Assessment & Plan Note (Addendum)
-  Patient presents today with itching and a rash over the last 3 weeks. -On exam, patient has noted to have an erythematous maculopapular rash over his, chest, upper arms -Etiology behind his rash remains uncertain.  Patient states that he has not changed his soap or shampoo or fragrances.  He does state that he is unsure of the detergent that his wife uses.  He also moved into a new house approximately 1 and half months ago. -I suspect that he likely has an irritant contact dermatitis. -I asked him to discuss with his wife whether there was a change in the detergent that she normally uses or even if she uses the same detergent whether she picked up a new fragrance.  If so, I encouraged him to wash his bedding and clothes with fragrance free or previous detergent -I encouraged him to use Zyrtec/Allegra during the day for itching as well as Benadryl at night -Patient also encouraged to use moisturizing lotion or calamine lotion to help with his itching -If your symptoms do not improve or worsen please contact us for further evaluation

## 2023-08-22 NOTE — Telephone Encounter (Signed)
 Spoke with pt and scheduled him with Dr. Heide Spark today.

## 2023-08-22 NOTE — Patient Instructions (Addendum)
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  The exact cause of this is uncertain at this time but it could be secondary to your detergent.  Please clarify with her wife asked to whether there was a change in the detergent use -Please wash your clothes and bed sheets and bed spreads with a previously used or fragrance free detergent -You can use Zyrtec or Allegra in the morning for itching as well as Benadryl at night -You can use moisturizing cream like Dove with itching as well as calamine lotion -Please contact us if the rash worsens or if your symptoms do not improve

## 2023-08-22 NOTE — Progress Notes (Signed)
 Acute Office Visit  Subjective:     Patient ID: Mike Farley, male    DOB: [birth year], 73 y.o.   MRN: [id number]  Chief Complaint  Patient presents with   itching    All over body itching    HPI Patient is in today for itching and rash for the last 3 weeks.  Patient states that he moved to a new house approximately 1-1/2 months ago but for the last 3 weeks he has noted itching especially over his torso and upper arms as well as his legs.  He is also noted a raised red rash in these areas.  He has tried conservative measures like Benadryl as well as various creams without any improvement.  He states that the itching is worse at night and is better in the morning.  No itching over his face or scalp or hands and forearms.  No fevers or chills.  No new soaps or shampoos.  No new fragrances.  He is unsure if his wife is using a new detergent.  Of note, patient states that he hit his left elbow against a door and has bruising.  Review of Systems  Constitutional: Negative.   Respiratory: Negative.    Cardiovascular: Negative.   Gastrointestinal: Negative.   Skin:  Positive for itching and rash.  Neurological: Negative.         Objective:    BP 120/74   Pulse 88   Ht 5\' 10"  (1.778 m)   Wt 164 lb 9.6 oz (74.7 kg)   SpO2 96%   BMI 23.62 kg/m    Physical Exam Constitutional:      Appearance: Normal appearance.  HENT:     Head: Normocephalic and atraumatic.  Cardiovascular:     Rate and Rhythm: Normal rate.     Heart sounds: Normal heart sounds.  Pulmonary:     Effort: Pulmonary effort is normal. No respiratory distress.     Breath sounds: Normal breath sounds. No wheezing.  Skin:    Comments: Patient noted to have a maculopapular erythematous rash with excoriations noted over his arms, chest, back.  No burrows or lesions noted in the webs of his fingers  He is also noted to have ecchymosis over his left elbow  Neurological:     Mental Status: He is alert and  oriented to person, place, and time.  Psychiatric:        Mood and Affect: Mood normal.        Behavior: Behavior normal.     No results found for any visits on 08/22/23.      Assessment & Plan:   Problem List Items Addressed This Visit       Musculoskeletal and Integument   Rash - Primary   -Patient presents today with itching and a rash over the last 3 weeks. -On exam, patient has noted to have an erythematous maculopapular rash over his, chest, upper arms -Etiology behind his rash remains uncertain.  Patient states that he has not changed his soap or shampoo or fragrances.  He does state that he is unsure of the detergent that his wife uses.  He also moved into a new house approximately 1 and half months ago. -I suspect that he likely has an irritant contact dermatitis. -I asked him to discuss with his wife whether there was a change in the detergent that she normally uses or even if she uses the same detergent whether she picked up a new fragrance.  If so,  I encouraged him to wash his bedding and clothes with fragrance free or previous detergent -I encouraged him to use Zyrtec/Allegra during the day for itching as well as Benadryl at night -Patient also encouraged to use moisturizing lotion or calamine lotion to help with his itching -If your symptoms do not improve or worsen please contact us for further evaluation       No orders of the defined types were placed in this encounter.   No follow-ups on file.  Earl Lagos, MD

## 2023-09-02 ENCOUNTER — Other Ambulatory Visit: Payer: Self-pay | Admitting: Internal Medicine

## 2023-09-02 NOTE — Telephone Encounter (Signed)
 Rx ok'd for seroquel #90 with one refill.

## 2023-09-10 ENCOUNTER — Encounter: Payer: Self-pay | Admitting: Internal Medicine

## 2023-09-12 ENCOUNTER — Other Ambulatory Visit: Payer: Self-pay

## 2023-09-12 ENCOUNTER — Ambulatory Visit (INDEPENDENT_AMBULATORY_CARE_PROVIDER_SITE_OTHER): Admitting: Family Medicine

## 2023-09-12 VITALS — BP 130/60 | HR 78 | Temp 98.9°F | Resp 20 | Ht 70.0 in | Wt 163.2 lb

## 2023-09-12 DIAGNOSIS — R0982 Postnasal drip: Secondary | ICD-10-CM | POA: Diagnosis not present

## 2023-09-12 DIAGNOSIS — R051 Acute cough: Secondary | ICD-10-CM

## 2023-09-12 DIAGNOSIS — J069 Acute upper respiratory infection, unspecified: Secondary | ICD-10-CM | POA: Diagnosis not present

## 2023-09-12 DIAGNOSIS — J320 Chronic maxillary sinusitis: Secondary | ICD-10-CM | POA: Diagnosis not present

## 2023-09-12 MED ORDER — PANTOPRAZOLE SODIUM 20 MG PO TBEC: 20.0000 mg | DELAYED_RELEASE_TABLET | Freq: Every day | ORAL | 3 refills | Status: AC

## 2023-09-12 MED ORDER — BENZONATATE 100 MG PO CAPS: 200.0000 mg | ORAL_CAPSULE | Freq: Three times a day (TID) | ORAL | 0 refills | Status: DC | PRN

## 2023-09-12 MED ORDER — AZELASTINE HCL 0.1 % NA SOLN: 2.0000 | Freq: Two times a day (BID) | NASAL | 12 refills | Status: DC

## 2023-09-12 MED ORDER — FLUTICASONE PROPIONATE 50 MCG/ACT NA SUSP: 2.0000 | Freq: Two times a day (BID) | NASAL | 6 refills | Status: DC

## 2023-09-12 MED ORDER — LEVOCETIRIZINE DIHYDROCHLORIDE 5 MG PO TABS: 5.0000 mg | ORAL_TABLET | Freq: Every evening | ORAL | 1 refills | Status: DC

## 2023-09-12 NOTE — Progress Notes (Signed)
 SUBJECTIVE:   Chief Complaint  Patient presents with   Cough    Over a week   Sinus Problem   HPI Presents for acute visit  Discussed the use of AI scribe software for clinical note transcription with the patient, who gave verbal consent to proceed.  History of Present Illness Mike Farley is a 73 year old male who presents with respiratory symptoms and sinus congestion.  His symptoms began approximately a week to ten days ago, coinciding with his usual pollen-related issues during this time of year. Three to four days ago, he developed a productive cough, bringing up light green and brown phlegm. The cough is described as deep in his chest and was initially frequent, but has become less productive after performing postural drainage techniques.  He experiences significant chest congestion and feels generally unwell. No recent fever, chest pain, or difficulty swallowing. He mentions some sinus pressure and postnasal drip, but not much nasal drainage currently. He has a history of allergies and has been using Flonase , Mucinex, and an expectorant to manage his symptoms. He also takes six Tylenol  a day for a major neck problem, along with oxycodone .  He has a history of smoking during college but quit over fifty years ago. He drinks approximately a gallon of water daily, as recommended when taking Mucinex. He has not been around anyone sick recently and has not tested for COVID-19, as he did not believe he had enough symptoms to warrant testing.  He has a standing prescription for Valtrex , which he takes as needed, and has been applying Abreva. He is concerned about his symptoms due to an upcoming trip to [place] on Thursday and wants to feel better before then.    PERTINENT PMH / PSH: As above  OBJECTIVE:  BP 130/60   Pulse 78   Temp 98.9 F (37.2 C)   Resp 20   Ht 5\' 10"  (1.778 m)   Wt 163 lb 4 oz (74 kg)   SpO2 97%   BMI 23.42 kg/m    Physical Exam Vitals  reviewed.  Constitutional:      General: He is not in acute distress.    Appearance: Normal appearance. He is normal weight. He is not ill-appearing, toxic-appearing or diaphoretic.  HENT:     Right Ear: Tympanic membrane, ear canal and external ear normal.     Left Ear: Tympanic membrane, ear canal and external ear normal.     Nose: Nose normal. No congestion.     Mouth/Throat:     Mouth: Mucous membranes are moist.     Pharynx: Postnasal drip present. No oropharyngeal exudate or posterior oropharyngeal erythema.  Eyes:     General:        Right eye: No discharge.        Left eye: No discharge.  Neck:     Thyroid : No thyromegaly or thyroid  tenderness.  Cardiovascular:     Rate and Rhythm: Normal rate and regular rhythm.     Heart sounds: Normal heart sounds.  Pulmonary:     Effort: Pulmonary effort is normal.     Breath sounds: Normal breath sounds. No wheezing or rhonchi.  Abdominal:     General: Bowel sounds are normal.  Musculoskeletal:        General: Normal range of motion.     Cervical back: Normal range of motion.  Lymphadenopathy:     Cervical: No cervical adenopathy.  Skin:    General: Skin is warm and dry.  Neurological:     Mental Status: He is alert and oriented to person, place, and time. Mental status is at baseline.  Psychiatric:        Mood and Affect: Mood normal.        Behavior: Behavior normal.        Thought Content: Thought content normal.        Judgment: Judgment normal.           08/22/2023    1:10 PM 08/03/2023   12:37 PM 04/29/2023   11:50 AM 01/19/2023   11:31 AM 03/11/2022   11:48 AM  Depression screen PHQ 2/9  Decreased Interest 3 1 0 2 1  Down, Depressed, Hopeless 3 1 1 2 1   PHQ - 2 Score 6 2 1 4 2   Altered sleeping 1 0 0 1 2  Tired, decreased energy 1 3 3 2 1   Change in appetite 3 0 0 2 1  Feeling bad or failure about yourself  1 0 0 0 0  Trouble concentrating 1 0 1 1 1   Moving slowly or fidgety/restless 2 0 0 0 0  Suicidal  thoughts 0 0 0 0 0  PHQ-9 Score 15 5 5 10 7   Difficult doing work/chores Very difficult Very difficult Not difficult at all Somewhat difficult Somewhat difficult       No data to display          ASSESSMENT/PLAN:  Post-nasal drip Assessment & Plan: Allergic rhinitis exacerbated by seasonal pollen, with sinus pressure and postnasal drip. Pale nasal mucosa observed. - Continue Flonase  two sprays twice a day. - Prescribe Astelin  nasal spray, two sprays twice a day. - Prescribe Xyzal  at night. - Advise to monitor symptoms and report if not improved by Wednesday. - Instruct to seek medical attention if fever, shortness of breath, or wheezing develop.  Orders: -     Fluticasone  Propionate; Place 2 sprays into both nostrils in the morning and at bedtime.  Dispense: 16 g; Refill: 6 -     Azelastine  HCl; Place 2 sprays into both nostrils 2 (two) times daily. Use in each nostril as directed  Dispense: 30 mL; Refill: 12 -     Levocetirizine Dihydrochloride ; Take 1 tablet (5 mg total) by mouth every evening.  Dispense: 30 tablet; Refill: 1  URI, acute Assessment & Plan: Symptoms consistent with acute viral URI, improving with less productive cough. No fever, chest pain, or shortness of breath. Oxygen saturation 97%. - Symptom management - Advise to monitor symptoms and report if not improved by Wednesday. - Instruct to seek medical attention if fever, shortness of breath, or wheezing develop.   Acute cough -     Benzonatate ; Take 2 capsules (200 mg total) by mouth 3 (three) times daily as needed.  Dispense: 20 capsule; Refill: 0  Maxillary sinusitis, unspecified chronicity Assessment & Plan: No improvement 3 days after recent office visit Prescription sent for Doxycycline  BID x 5 days  Orders: -     Doxycycline  Hyclate; Take 1 tablet (100 mg total) by mouth 2 (two) times daily for 5 days.  Dispense: 10 tablet; Refill: 0          PDMP reviewed  Return if symptoms worsen or  fail to improve, for PCP.  Valli Gaw, MD

## 2023-09-12 NOTE — Telephone Encounter (Signed)
Pt scheduled with Dr Volanda Napoleon.

## 2023-09-12 NOTE — Patient Instructions (Addendum)
 It was a pleasure meeting you today. Thank you for allowing me to take part in your health care.  Our goals for today as we discussed include:  Use Flonase  nasal spray 2 sprays two times a day Start Astelin  nasal spray 2 sprays two times a day Start Xyzal  5 mg at night Tessalon  perles 3 times a day as needed  If you have any fevers, shortness of breath, wheezing please notify MD in 3 days and will discuss if needing further treatment.   You can take Tylenol  and/or Ibuprofen as needed for fever reduction and pain relief.   For cough: honey 1/2 to 1 teaspoon (you can dilute the honey in water or another fluid).  You can also use guaifenesin and dextromethorphan for cough. You can use a humidifier for chest congestion and cough.  If you don't have a humidifier, you can sit in the bathroom with the hot shower running.      For sore throat: try warm salt water gargles, cepacol lozenges, throat spray, warm tea or water with lemon/honey, popsicles or ice, or OTC cold relief medicine for throat discomfort.   For congestion: take a daily anti-histamine like Xyzal .  You can also use Flonase  1-2 sprays in each nostril daily.   It is important to stay hydrated: drink plenty of fluids (water, gatorade/powerade/pedialyte, juices, or teas) to keep your throat moisturized and help further relieve irritation/discomfort.    This is a list of the screening recommended for you and due dates:  Health Maintenance  Topic Date Due   COVID-19 Vaccine (8 - Pfizer risk 2024-25 season) 09/14/2023   Flu Shot  12/23/2023   Medicare Annual Wellness Visit  01/19/2024   DTaP/Tdap/Td vaccine (3 - Td or Tdap) 10/08/2030   Pneumonia Vaccine  Completed   Hepatitis C Screening  Completed   Zoster (Shingles) Vaccine  Completed   HPV Vaccine  Aged Out   Meningitis B Vaccine  Aged Out   Colon Cancer Screening  Discontinued      If you have any questions or concerns, please do not hesitate to call the office at [phone].  I look forward to our next visit and until then take care and stay safe.  Regards,   Valli Gaw, MD   [facility]

## 2023-09-14 ENCOUNTER — Encounter: Payer: Self-pay | Admitting: Family Medicine

## 2023-09-14 MED ORDER — DOXYCYCLINE HYCLATE 100 MG PO TABS: 100.0000 mg | ORAL_TABLET | Freq: Two times a day (BID) | ORAL | 0 refills | Status: DC

## 2023-09-17 ENCOUNTER — Encounter: Payer: Self-pay | Admitting: Family Medicine

## 2023-09-18 ENCOUNTER — Encounter: Payer: Self-pay | Admitting: Family Medicine

## 2023-09-18 DIAGNOSIS — J069 Acute upper respiratory infection, unspecified: Secondary | ICD-10-CM | POA: Insufficient documentation

## 2023-09-18 DIAGNOSIS — J320 Chronic maxillary sinusitis: Secondary | ICD-10-CM | POA: Insufficient documentation

## 2023-09-18 DIAGNOSIS — R0982 Postnasal drip: Secondary | ICD-10-CM | POA: Insufficient documentation

## 2023-09-18 NOTE — Assessment & Plan Note (Signed)
 No improvement 3 days after recent office visit Prescription sent for Doxycycline  BID x 5 days

## 2023-09-18 NOTE — Assessment & Plan Note (Deleted)
 No improvement 3 days after office visit Prescription sent for Doxycycline  BID x 5 days

## 2023-09-18 NOTE — Assessment & Plan Note (Signed)
 Symptoms consistent with acute viral URI, improving with less productive cough. No fever, chest pain, or shortness of breath. Oxygen saturation 97%. - Symptom management - Advise to monitor symptoms and report if not improved by Wednesday. - Instruct to seek medical attention if fever, shortness of breath, or wheezing develop.

## 2023-09-18 NOTE — Assessment & Plan Note (Signed)
 Allergic rhinitis exacerbated by seasonal pollen, with sinus pressure and postnasal drip. Pale nasal mucosa observed. - Continue Flonase  two sprays twice a day. - Prescribe Astelin  nasal spray, two sprays twice a day. - Prescribe Xyzal  at night. - Advise to monitor symptoms and report if not improved by Wednesday. - Instruct to seek medical attention if fever, shortness of breath, or wheezing develop.

## 2023-09-19 ENCOUNTER — Ambulatory Visit (INDEPENDENT_AMBULATORY_CARE_PROVIDER_SITE_OTHER)

## 2023-09-19 ENCOUNTER — Ambulatory Visit: Admitting: Family Medicine

## 2023-09-19 ENCOUNTER — Encounter: Payer: Self-pay | Admitting: Family Medicine

## 2023-09-19 VITALS — BP 130/78 | HR 70 | Temp 98.6°F | Resp 20 | Ht 70.0 in | Wt 162.0 lb

## 2023-09-19 DIAGNOSIS — U071 COVID-19: Secondary | ICD-10-CM

## 2023-09-19 DIAGNOSIS — J069 Acute upper respiratory infection, unspecified: Secondary | ICD-10-CM | POA: Diagnosis not present

## 2023-09-19 DIAGNOSIS — R059 Cough, unspecified: Secondary | ICD-10-CM | POA: Diagnosis not present

## 2023-09-19 DIAGNOSIS — Z981 Arthrodesis status: Secondary | ICD-10-CM | POA: Diagnosis not present

## 2023-09-19 LAB — POCT INFLUENZA A/B
Influenza A, POC: NEGATIVE
Influenza B, POC: NEGATIVE

## 2023-09-19 LAB — POC COVID19 BINAXNOW: SARS Coronavirus 2 Ag: POSITIVE — AB

## 2023-09-19 MED ORDER — HYDROCOD POLI-CHLORPHE POLI ER 10-8 MG/5ML PO SUER: 5.0000 mL | Freq: Two times a day (BID) | ORAL | 0 refills | Status: DC | PRN

## 2023-09-19 MED ORDER — ALBUTEROL SULFATE HFA 108 (90 BASE) MCG/ACT IN AERS
2.0000 | INHALATION_SPRAY | Freq: Four times a day (QID) | RESPIRATORY_TRACT | 0 refills | Status: DC | PRN
Start: 2023-09-19 — End: 2023-12-14

## 2023-09-19 NOTE — Progress Notes (Signed)
 SUBJECTIVE:   Chief Complaint  Patient presents with   Cough    X 10-14 days   HPI Presents for acute visit  Discussed the use of AI scribe software for clinical note transcription with the patient, who gave verbal consent to proceed.  History of Present Illness Mike Farley is a 73 year old male who presents with persistent cough following a COVID-19 infection.  He has been experiencing a persistent cough for at least twenty days, describing it as severe and productive of phlegm, though he struggles to expectorate effectively. The cough has not improved despite completing a course of doxycycline  yesterday and has progressively worsened over time.  He was diagnosed with COVID-19 and has been symptomatic for approximately fourteen to twenty days. Initially, he had nasal congestion, which improved with treatment, but the cough persisted. No significant shortness of breath or wheezing. Oxygen saturation is 99%.  He experiences diarrhea, which he attributes to the doxycycline  treatment, and did not take probiotics during the antibiotic course. No fever, but mentions occasional chills and feeling cold or warm at times.  His past medical history includes chronic neck pain managed with oxycodone  four times a day and a history of spine disease with multiple fusions, including a double lumbar fusion. He has not been exposed to anyone with COVID-19 to his knowledge, and no one around him, including his wife, has developed similar symptoms.    PERTINENT PMH / PSH: As above  OBJECTIVE:  BP 130/78   Pulse 70   Temp 98.6 F (37 C)   Resp 20   Ht 5\' 10"  (1.778 m)   Wt 162 lb (73.5 kg)   SpO2 99%   BMI 23.24 kg/m    Physical Exam Vitals reviewed.  Constitutional:      General: He is not in acute distress.    Appearance: Normal appearance. He is normal weight. He is not ill-appearing, toxic-appearing or diaphoretic.  HENT:     Nose: Congestion and rhinorrhea present.  Eyes:      General:        Right eye: No discharge.        Left eye: No discharge.  Cardiovascular:     Rate and Rhythm: Normal rate and regular rhythm.     Heart sounds: Normal heart sounds.  Pulmonary:     Effort: Pulmonary effort is normal.     Breath sounds: Normal breath sounds. No wheezing or rhonchi.  Musculoskeletal:        General: Normal range of motion.     Cervical back: Normal range of motion.  Skin:    General: Skin is warm and dry.  Neurological:     Mental Status: He is alert and oriented to person, place, and time. Mental status is at baseline.  Psychiatric:        Mood and Affect: Mood normal.        Behavior: Behavior normal.        Thought Content: Thought content normal.        Judgment: Judgment normal.           09/19/2023    3:16 PM 08/22/2023    1:10 PM 08/03/2023   12:37 PM 04/29/2023   11:50 AM 01/19/2023   11:31 AM  Depression screen PHQ 2/9  Decreased Interest 2 3 1  0 2  Down, Depressed, Hopeless 1 3 1 1 2   PHQ - 2 Score 3 6 2 1 4   Altered sleeping 2 1 0 0  1  Tired, decreased energy 3 1 3 3 2   Change in appetite 1 3 0 0 2  Feeling bad or failure about yourself  0 1 0 0 0  Trouble concentrating 1 1 0 1 1  Moving slowly or fidgety/restless 1 2 0 0 0  Suicidal thoughts 0 0 0 0 0  PHQ-9 Score 11 15 5 5 10   Difficult doing work/chores Somewhat difficult Very difficult Very difficult Not difficult at all Somewhat difficult      09/19/2023    3:16 PM  GAD 7 : Generalized Anxiety Score  Nervous, Anxious, on Edge 1  Control/stop worrying 0  Worry too much - different things 0  Trouble relaxing 1  Restless 0  Easily annoyed or irritable 1  Afraid - awful might happen 0  Total GAD 7 Score 3  Anxiety Difficulty Somewhat difficult    ASSESSMENT/PLAN:  COVID Assessment & Plan: Persistent cough and shortness of breath likely due to positive COVID-19. Oxygen saturation at 99%. Antiviral treatment not recommended due to symptom duration. In no acute  respiratory distress and hemodynamically stable. - Provide symptomatic treatment for cough. - Prescribe inhaler for shortness of breath. - Prescribe cough syrup.  Orders: -     DG Chest 2 View -     POC COVID-19 BinaxNow -     POCT Influenza A/B -     Albuterol  Sulfate HFA; Inhale 2 puffs into the lungs every 6 (six) hours as needed for wheezing or shortness of breath.  Dispense: 8 g; Refill: 0 -     Hydrocod Poli-Chlorphe Poli ER; Take 5 mLs by mouth every 12 (twelve) hours as needed for cough.  Dispense: 50 mL; Refill: 0  URI, acute Assessment & Plan: Recently treated with doxycycline  for pneumonia and sinus infection. Symptoms persist, further antibiotics not recommended. Diarrhea likely from doxycycline , probiotics advised. Continues to have persistent cough.  COVID test today positive. - Review chest x-ray to rule out persistent pneumonia. - Recommend over-the-counter probiotics for 14 days.    PDMP reviewed  Return if symptoms worsen or fail to improve, for PCP.  Valli Gaw, MD

## 2023-09-19 NOTE — Patient Instructions (Signed)
 It was a pleasure meeting you today. Thank you for allowing me to take part in your health care.  Our goals for today as we discussed include:  COVID positive  Albuterol 1-2 puffs every 6 hours as needed for shortness of breath or wheezing Tussionex cough syrup two times a day as needed.  Use only when needed  Continue nasal sprays as previously prescribed  You can take Tylenol  and/or Ibuprofen as needed for fever reduction and pain relief.   For cough: honey 1/2 to 1 teaspoon (you can dilute the honey in water or another fluid).  You can also use guaifenesin and dextromethorphan for cough. You can use a humidifier for chest congestion and cough.  If you don't have a humidifier, you can sit in the bathroom with the hot shower running.      For sore throat: try warm salt water gargles, cepacol lozenges, throat spray, warm tea or water with lemon/honey, popsicles or ice, or OTC cold relief medicine for throat discomfort.   For congestion: take a daily anti-histamine like Xyzal   You can also use Flonase  1-2 sprays in each nostril daily.   It is important to stay hydrated: drink plenty of fluids (water, gatorade/powerade/pedialyte, juices, or teas) to keep your throat moisturized and help further relieve irritation/discomfort.    If you have any questions or concerns, please do not hesitate to call the office at [phone].  I look forward to our next visit and until then take care and stay safe.  Regards,   Valli Gaw, MD   [facility]

## 2023-09-20 ENCOUNTER — Encounter: Payer: Self-pay | Admitting: Family Medicine

## 2023-09-20 NOTE — Progress Notes (Signed)
 Noted.

## 2023-09-22 ENCOUNTER — Encounter: Payer: Self-pay | Admitting: Family Medicine

## 2023-09-25 ENCOUNTER — Encounter: Payer: Self-pay | Admitting: Family Medicine

## 2023-09-25 DIAGNOSIS — U071 COVID-19: Secondary | ICD-10-CM | POA: Insufficient documentation

## 2023-09-25 NOTE — Assessment & Plan Note (Signed)
 Recently treated with doxycycline  for pneumonia and sinus infection. Symptoms persist, further antibiotics not recommended. Diarrhea likely from doxycycline , probiotics advised. Continues to have persistent cough.  COVID test today positive. - Review chest x-ray to rule out persistent pneumonia. - Recommend over-the-counter probiotics for 14 days.

## 2023-09-25 NOTE — Assessment & Plan Note (Signed)
 Persistent cough and shortness of breath likely due to positive COVID-19. Oxygen saturation at 99%. Antiviral treatment not recommended due to symptom duration. In no acute respiratory distress and hemodynamically stable. - Provide symptomatic treatment for cough. - Prescribe inhaler for shortness of breath. - Prescribe cough syrup.

## 2023-09-27 ENCOUNTER — Encounter: Payer: Self-pay | Admitting: Internal Medicine

## 2023-09-28 NOTE — Telephone Encounter (Signed)
 Patient is aware. No other acute symptoms. Does not want b12 level checked. Will let me know if he decides he would like a f/u appt.

## 2023-09-28 NOTE — Telephone Encounter (Signed)
 Fatigue is common after covid infection. I do not mind checking a B12 level and seeing if he needs B12. Also, if he feels he needs to be evaluated can schedule an appt to f/u with me. Please confirm no other acute symptoms.

## 2023-09-30 ENCOUNTER — Other Ambulatory Visit: Payer: Self-pay | Admitting: Internal Medicine

## 2023-10-18 DIAGNOSIS — C61 Malignant neoplasm of prostate: Secondary | ICD-10-CM | POA: Diagnosis not present

## 2023-10-27 DIAGNOSIS — M4802 Spinal stenosis, cervical region: Secondary | ICD-10-CM | POA: Diagnosis not present

## 2023-10-27 DIAGNOSIS — M47812 Spondylosis without myelopathy or radiculopathy, cervical region: Secondary | ICD-10-CM | POA: Diagnosis not present

## 2023-10-27 DIAGNOSIS — M4721 Other spondylosis with radiculopathy, occipito-atlanto-axial region: Secondary | ICD-10-CM | POA: Diagnosis not present

## 2023-10-27 DIAGNOSIS — G894 Chronic pain syndrome: Secondary | ICD-10-CM | POA: Diagnosis not present

## 2023-11-07 ENCOUNTER — Other Ambulatory Visit: Payer: Self-pay | Admitting: Internal Medicine

## 2023-11-15 DIAGNOSIS — M48061 Spinal stenosis, lumbar region without neurogenic claudication: Secondary | ICD-10-CM | POA: Diagnosis not present

## 2023-11-17 ENCOUNTER — Other Ambulatory Visit: Payer: Self-pay | Admitting: Neurosurgery

## 2023-11-17 DIAGNOSIS — M48061 Spinal stenosis, lumbar region without neurogenic claudication: Secondary | ICD-10-CM

## 2023-11-18 ENCOUNTER — Encounter: Payer: Self-pay | Admitting: Neurosurgery

## 2023-11-29 ENCOUNTER — Telehealth: Payer: Self-pay | Admitting: Internal Medicine

## 2023-11-29 DIAGNOSIS — E7800 Pure hypercholesterolemia, unspecified: Secondary | ICD-10-CM

## 2023-11-29 DIAGNOSIS — D649 Anemia, unspecified: Secondary | ICD-10-CM

## 2023-11-29 DIAGNOSIS — R739 Hyperglycemia, unspecified: Secondary | ICD-10-CM

## 2023-11-29 NOTE — Telephone Encounter (Signed)
 Lab order needed

## 2023-11-30 ENCOUNTER — Ambulatory Visit
Admission: RE | Admit: 2023-11-30 | Discharge: 2023-11-30 | Disposition: A | Discharge status: Home / Self Care | Source: Ambulatory Visit | Attending: Neurosurgery | Admitting: Neurosurgery

## 2023-11-30 DIAGNOSIS — Z981 Arthrodesis status: Secondary | ICD-10-CM | POA: Diagnosis not present

## 2023-11-30 DIAGNOSIS — M48061 Spinal stenosis, lumbar region without neurogenic claudication: Secondary | ICD-10-CM

## 2023-11-30 MED ORDER — GADOPICLENOL 0.5 MMOL/ML IV SOLN
7.0000 mL | Freq: Once | INTRAVENOUS | Status: AC | PRN
  Administered 2023-11-30: 7 mL via INTRAVENOUS

## 2023-11-30 NOTE — Telephone Encounter (Signed)
Order placed for future labs

## 2023-12-12 ENCOUNTER — Other Ambulatory Visit (INDEPENDENT_AMBULATORY_CARE_PROVIDER_SITE_OTHER)

## 2023-12-12 DIAGNOSIS — R739 Hyperglycemia, unspecified: Secondary | ICD-10-CM | POA: Diagnosis not present

## 2023-12-12 DIAGNOSIS — D649 Anemia, unspecified: Secondary | ICD-10-CM | POA: Diagnosis not present

## 2023-12-12 DIAGNOSIS — E7800 Pure hypercholesterolemia, unspecified: Secondary | ICD-10-CM

## 2023-12-12 DIAGNOSIS — M48061 Spinal stenosis, lumbar region without neurogenic claudication: Secondary | ICD-10-CM | POA: Diagnosis not present

## 2023-12-12 LAB — CBC WITH DIFFERENTIAL/PLATELET
Basophils Absolute: 0 K/uL (ref 0.0–0.1)
Basophils Relative: 0.8 % (ref 0.0–3.0)
Eosinophils Absolute: 0.6 K/uL (ref 0.0–0.7)
Eosinophils Relative: 12.2 % — ABNORMAL HIGH (ref 0.0–5.0)
HCT: 35.8 % — ABNORMAL LOW (ref 39.0–52.0)
Hemoglobin: 12.1 g/dL — ABNORMAL LOW (ref 13.0–17.0)
Lymphocytes Relative: 31.3 % (ref 12.0–46.0)
Lymphs Abs: 1.4 K/uL (ref 0.7–4.0)
MCHC: 33.8 g/dL (ref 30.0–36.0)
MCV: 93 fl (ref 78.0–100.0)
Monocytes Absolute: 0.4 K/uL (ref 0.1–1.0)
Monocytes Relative: 8.2 % (ref 3.0–12.0)
Neutro Abs: 2.2 K/uL (ref 1.4–7.7)
Neutrophils Relative %: 47.5 % (ref 43.0–77.0)
Platelets: 240 K/uL (ref 150.0–400.0)
RBC: 3.85 Mil/uL — ABNORMAL LOW (ref 4.22–5.81)
RDW: 13.9 % (ref 11.5–15.5)
WBC: 4.6 K/uL (ref 4.0–10.5)

## 2023-12-12 LAB — LIPID PANEL
Cholesterol: 163 mg/dL (ref 0–200)
HDL: 79.7 mg/dL (ref >39.00)
LDL Cholesterol: 72 mg/dL (ref 0–99)
NonHDL: 83.27
Total CHOL/HDL Ratio: 2
Triglycerides: 56 mg/dL (ref 0.0–149.0)
VLDL: 11.2 mg/dL (ref 0.0–40.0)

## 2023-12-12 LAB — HEPATIC FUNCTION PANEL
ALT: 12 U/L (ref 0–53)
AST: 19 U/L (ref 0–37)
Albumin: 4.2 g/dL (ref 3.5–5.2)
Alkaline Phosphatase: 84 U/L (ref 39–117)
Bilirubin, Direct: 0.1 mg/dL (ref 0.0–0.3)
Total Bilirubin: 0.5 mg/dL (ref 0.2–1.2)
Total Protein: 6.5 g/dL (ref 6.0–8.3)

## 2023-12-12 LAB — BASIC METABOLIC PANEL WITH GFR
BUN: 12 mg/dL (ref 6–23)
CO2: 30 meq/L (ref 19–32)
Calcium: 9.2 mg/dL (ref 8.4–10.5)
Chloride: 103 meq/L (ref 96–112)
Creatinine, Ser: 0.71 mg/dL (ref 0.40–1.50)
GFR: 91 mL/min (ref >60.00)
Glucose, Bld: 94 mg/dL (ref 70–99)
Potassium: 4.4 meq/L (ref 3.5–5.1)
Sodium: 140 meq/L (ref 135–145)

## 2023-12-12 LAB — HEMOGLOBIN A1C: Hgb A1c MFr Bld: 5.6 % (ref 4.6–6.5)

## 2023-12-13 ENCOUNTER — Ambulatory Visit (INDEPENDENT_AMBULATORY_CARE_PROVIDER_SITE_OTHER)

## 2023-12-13 ENCOUNTER — Ambulatory Visit: Payer: Self-pay | Admitting: Internal Medicine

## 2023-12-13 ENCOUNTER — Other Ambulatory Visit: Payer: Self-pay | Admitting: Internal Medicine

## 2023-12-13 DIAGNOSIS — D649 Anemia, unspecified: Secondary | ICD-10-CM | POA: Diagnosis not present

## 2023-12-13 DIAGNOSIS — M4721 Other spondylosis with radiculopathy, occipito-atlanto-axial region: Secondary | ICD-10-CM | POA: Diagnosis not present

## 2023-12-13 DIAGNOSIS — Z79891 Long term (current) use of opiate analgesic: Secondary | ICD-10-CM | POA: Diagnosis not present

## 2023-12-13 DIAGNOSIS — M4802 Spinal stenosis, cervical region: Secondary | ICD-10-CM | POA: Diagnosis not present

## 2023-12-13 DIAGNOSIS — M47812 Spondylosis without myelopathy or radiculopathy, cervical region: Secondary | ICD-10-CM | POA: Diagnosis not present

## 2023-12-13 DIAGNOSIS — G894 Chronic pain syndrome: Secondary | ICD-10-CM | POA: Diagnosis not present

## 2023-12-13 LAB — IBC + FERRITIN
Ferritin: 53.7 ng/mL (ref 22.0–322.0)
Iron: 59 ug/dL (ref 42–165)
Saturation Ratios: 18.4 % — ABNORMAL LOW (ref 20.0–50.0)
TIBC: 320.6 ug/dL (ref 250.0–450.0)
Transferrin: 229 mg/dL (ref 212.0–360.0)

## 2023-12-13 NOTE — Progress Notes (Signed)
Order placed for add on lab.  °

## 2023-12-14 ENCOUNTER — Ambulatory Visit (INDEPENDENT_AMBULATORY_CARE_PROVIDER_SITE_OTHER): Admitting: Internal Medicine

## 2023-12-14 ENCOUNTER — Encounter: Payer: Self-pay | Admitting: Internal Medicine

## 2023-12-14 VITALS — BP 120/74 | HR 88 | Temp 98.3°F | Resp 16 | Ht 70.0 in | Wt 162.8 lb

## 2023-12-14 DIAGNOSIS — F320 Major depressive disorder, single episode, mild: Secondary | ICD-10-CM

## 2023-12-14 DIAGNOSIS — I700 Atherosclerosis of aorta: Secondary | ICD-10-CM | POA: Diagnosis not present

## 2023-12-14 DIAGNOSIS — D649 Anemia, unspecified: Secondary | ICD-10-CM | POA: Diagnosis not present

## 2023-12-14 DIAGNOSIS — M542 Cervicalgia: Secondary | ICD-10-CM | POA: Diagnosis not present

## 2023-12-14 DIAGNOSIS — R5383 Other fatigue: Secondary | ICD-10-CM | POA: Diagnosis not present

## 2023-12-14 DIAGNOSIS — G8929 Other chronic pain: Secondary | ICD-10-CM | POA: Diagnosis not present

## 2023-12-14 DIAGNOSIS — G3184 Mild cognitive impairment, so stated: Secondary | ICD-10-CM

## 2023-12-14 DIAGNOSIS — C61 Malignant neoplasm of prostate: Secondary | ICD-10-CM

## 2023-12-14 DIAGNOSIS — R739 Hyperglycemia, unspecified: Secondary | ICD-10-CM

## 2023-12-14 DIAGNOSIS — E7800 Pure hypercholesterolemia, unspecified: Secondary | ICD-10-CM

## 2023-12-14 NOTE — Assessment & Plan Note (Signed)
 Continue crestor

## 2023-12-14 NOTE — Assessment & Plan Note (Signed)
 Just saw neurosurgery as outlined.  Recommended starting meloxicam .  Discussed possible side effects and risk of medication.  Will monitor.  Continue follow-up with neurosurgery.

## 2023-12-14 NOTE — Assessment & Plan Note (Signed)
 Has been a persistent issue for him.  He appears to be doing better.  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  He is walking 5 miles a day now.  Exercise him.  Follow.

## 2023-12-14 NOTE — Assessment & Plan Note (Signed)
 Continue low-carb diet and exercise.  Recent A1c within normal limits at 5.6.  Follow.

## 2023-12-14 NOTE — Assessment & Plan Note (Signed)
 Continues on Seroquel .  Appears to be doing well.  Exercising.  Followed.

## 2023-12-14 NOTE — Assessment & Plan Note (Signed)
 Continue crestor .  Low cholesterol diet and exercise.  Follow lipid panel and liver function tests. Discussed recent labs. No changes in medication today.

## 2023-12-14 NOTE — Progress Notes (Signed)
 Subjective:    Patient ID: Mike Farley, male    DOB: [birth year], 73 y.o.   MRN: [id number]  Patient here for  Chief Complaint  Patient presents with   Medical Management of Chronic Issues    HPI Here for a scheduled follow up - follow up regarding anemia, hypercholesterolemia and hyperglycemia. Has seen neurology - f/u [facility]. Continues on aricept . Saw [facility] - Monday - Dr Cabbell. Started on meloxicam .  This was started for his neck pain discussed monitoring for possible side effects.  Discussed trying to limit use of meloxicam .  He is walking now - 5 miles per day. Exercising.  No chest pain or shortness of breath reported.  No abdominal pain or bowel changes reported.  He is doing breathing exercises.  Overall feels better.  Still with some decreased energy.  Discussed recent labs.  Hemoglobin relatively stable.   Past Medical History:  Diagnosis Date   Anxiety    Arthritis    shoulder   Cancer (HCC)    prostate ca - Md just watching   Cervical pain (neck)    limited mobility   Chronic pain    Decreased ROM of neck    Depression    years ago (minor)   Diverticulosis    Duodenal diverticulum    Elevated PSA    Hemorrhoids    Hiatal hernia    History of chicken pox    HLD (hyperlipidemia)    Hx of cold sores    Internal hemorrhoids    Mild cognitive impairment with memory loss    Neuropathy    Past Surgical History:  Procedure Laterality Date   ABDOMINAL EXPOSURE N/A 04/23/2021   Procedure: ABDOMINAL EXPOSURE;  Surgeon: Gretta Lonni PARAS, MD;  Location: [facility];  Service: Vascular;  Laterality: N/A;   ANTERIOR FUSION CERVICAL SPINE     4-5 levels; has had 3-4 surgeries for this   ANTERIOR LUMBAR FUSION N/A 04/23/2021   Procedure: Lumbar four-five,  Lumbar five-Sacral one Anterior Lumbar Interbody Fusion;  Surgeon: Dawley, Lani BROCKS, DO;  Location: [facility];  Service: Neurosurgery;  Laterality: N/A;   APPENDECTOMY     BACK SURGERY     BACK SURGERY  04/2022   BONE  EXCISION Left 09/28/2017   Procedure: BONE EXCISION-TAILORS  EXOSTECTOMY;  Surgeon: Ashley Soulier, DPM;  Location: [facility];  Service: Podiatry;  Laterality: Left;  IVA LOCAL   CERVICAL DISCECTOMY  2008   C2-C7 Fused    CHOLECYSTECTOMY     COLONOSCOPY  2020   COLONOSCOPY  2019   [facility] Due 03/2022 per patient   ELBOW FRACTURE SURGERY Left 1990   ESOPHAGOGASTRODUODENOSCOPY (EGD) WITH PROPOFOL  N/A 10/06/2021   Procedure: ESOPHAGOGASTRODUODENOSCOPY (EGD) WITH PROPOFOL ;  Surgeon: Maryruth Ole DASEN, MD;  Location: [facility];  Service: Endoscopy;  Laterality: N/A;   LUMBAR PERCUTANEOUS PEDICLE SCREW 2 LEVEL N/A 04/23/2021   Procedure: LUMBAR PERCUTANEOUS PEDICLE SCREW LUMBAR FOUR-SACRAL ONE;  Surgeon: Carollee Lani BROCKS, DO;  Location: [facility];  Service: Neurosurgery;  Laterality: N/A;   METATARSAL HEAD EXCISION Left 07/04/2020   Procedure: METATARSAL HEAD EXCISION;  Surgeon: Ashley Soulier, DPM;  Location: [facility];  Service: Podiatry;  Laterality: Left;   PROSTATE BIOPSY N/A 03/15/2019   Procedure: PROSTATE BIOPSY Uro Nav;  Surgeon: Kassie Ozell SAUNDERS, MD;  Location: [facility];  Service: Urology;  Laterality: N/A;   RADIOLOGY WITH ANESTHESIA N/A 04/22/2020   Procedure: MRI WITH ANESTHESIA BRAIN WITHOUT CONTRAST;  Surgeon: Radiologist, Medication, MD;  Location: [facility];  Service: Radiology;  Laterality: N/A;   REPLACEMENT TOTAL KNEE Right    right knee   ROTATOR CUFF REPAIR Right 2018   with arthroscopy and distal claviculectomy   TONSILLECTOMY AND ADENOIDECTOMY     TOTAL KNEE ARTHROPLASTY Right 2005   x 4   Family History  Problem Relation Age of Onset   Arthritis Mother    Breast cancer Mother    Mental illness Mother    Colon cancer Mother    Bipolar disorder Mother    Depression Mother    Colon polyps Mother    Lung cancer Father    Prostate cancer Father    Alcohol abuse Brother    Kidney disease Brother    Esophageal cancer Neg Hx     Social History   Socioeconomic History   Marital status: Married    Spouse name: Not on file   Number of children: Not on file   Years of education: Not on file   Highest education level: Bachelor's degree (e.g., BA, AB, BS)  Occupational History   Not on file  Tobacco Use   Smoking status: Former   Smokeless tobacco: Former    Types: Chew   Tobacco comments:    Quit 50 yrs ago for smoking and quit chewing 2 months ago  Vaping Use   Vaping status: Never Used  Substance and Sexual Activity   Alcohol use: Yes    Alcohol/week: 7.0 standard drinks of alcohol    Types: 7 Standard drinks or equivalent per week    Comment: beer/wine   Drug use: Never   Sexual activity: Not Currently  Other Topics Concern   Not on file  Social History Narrative   Married   Social Drivers of Health   Financial Resource Strain: Low Risk  (09/12/2023)   Overall Financial Resource Strain (CARDIA)    Difficulty of Paying Living Expenses: Not hard at all  Food Insecurity: No Food Insecurity (09/12/2023)   Hunger Vital Sign    Worried About Running Out of Food in the Last Year: Never true    Ran Out of Food in the Last Year: Never true  Transportation Needs: No Transportation Needs (09/12/2023)   PRAPARE - Administrator, Civil Service (Medical): No    Lack of Transportation (Non-Medical): No  Physical Activity: Sufficiently Active (09/12/2023)   Exercise Vital Sign    Days of Exercise per Week: 4 days    Minutes of Exercise per Session: 50 min  Stress: Stress Concern Present (09/12/2023)   [employer] of Occupational Health - Occupational Stress Questionnaire    Feeling of Stress : Rather much  Social Connections: Socially Integrated (09/12/2023)   Social Connection and Isolation Panel    Frequency of Communication with Friends and Family: More than three times a week    Frequency of Social Gatherings with Friends and Family: Three times a week    Attends Religious Services:  More than 4 times per year    Active Member of Clubs or Organizations: Yes    Attends Banker Meetings: More than 4 times per year    Marital Status: Married     Review of Systems  Constitutional:  Positive for fatigue. Negative for appetite change and unexpected weight change.  HENT:  Negative for congestion and sinus pressure.   Respiratory:  Negative for cough, chest tightness and shortness of breath.   Cardiovascular:  Negative for chest pain,  palpitations and leg swelling.  Gastrointestinal:  Negative for abdominal pain, diarrhea, nausea and vomiting.  Genitourinary:  Negative for difficulty urinating and dysuria.  Musculoskeletal:  Positive for neck pain. Negative for joint swelling and myalgias.  Skin:  Negative for color change and rash.  Neurological:  Negative for dizziness and headaches.  Psychiatric/Behavioral:  Negative for agitation and dysphoric mood.        Objective:     BP 120/74   Pulse 88   Temp 98.3 F (36.8 C)   Resp 16   Ht 5' 10 (1.778 m)   Wt 162 lb 12.8 oz (73.8 kg)   SpO2 98%   BMI 23.36 kg/m  Wt Readings from Last 3 Encounters:  12/14/23 162 lb 12.8 oz (73.8 kg)  09/19/23 162 lb (73.5 kg)  09/12/23 163 lb 4 oz (74 kg)    Physical Exam Vitals reviewed.  Constitutional:      General: He is not in acute distress.    Appearance: Normal appearance. He is well-developed.  HENT:     Head: Normocephalic and atraumatic.     Right Ear: External ear normal.     Left Ear: External ear normal.     Mouth/Throat:     Pharynx: No oropharyngeal exudate or posterior oropharyngeal erythema.  Eyes:     General: No scleral icterus.       Right eye: No discharge.        Left eye: No discharge.     Conjunctiva/sclera: Conjunctivae normal.  Cardiovascular:     Rate and Rhythm: Normal rate and regular rhythm.  Pulmonary:     Effort: Pulmonary effort is normal. No respiratory distress.     Breath sounds: Normal breath sounds.  Abdominal:      General: Bowel sounds are normal.     Palpations: Abdomen is soft.     Tenderness: There is no abdominal tenderness.  Musculoskeletal:        General: No swelling or tenderness.     Cervical back: Neck supple. No tenderness.  Lymphadenopathy:     Cervical: No cervical adenopathy.  Skin:    Findings: No erythema or rash.  Neurological:     Mental Status: He is alert.  Psychiatric:        Mood and Affect: Mood normal.        Behavior: Behavior normal.         Outpatient Encounter Medications as of 12/14/2023  Medication Sig   meloxicam  (MOBIC ) 7.5 MG tablet Take 7.5 mg by mouth daily.   acetaminophen  (TYLENOL ) 325 MG tablet Take 325 mg by mouth 4 (four) times daily.   cholecalciferol (VITAMIN D3) 25 MCG (1000 UT) tablet Take 1,000 Units by mouth 3 (three) times a week.    cyanocobalamin  1000 MCG tablet Take 1,000 mcg by mouth daily.    donepezil  (ARICEPT ) 5 MG tablet Take 5 mg by mouth daily.   Melatonin 10 MG TABS Take by mouth.   methocarbamol  (ROBAXIN ) 500 MG tablet Take 500 mg by mouth 3 (three) times daily.   Multiple Vitamin (MULTIVITAMIN) tablet Take 1 tablet by mouth daily. ABC Complete MEN   Oxycodone  HCl 10 MG TABS 10 mg 4 (four) times daily   pantoprazole  (PROTONIX ) 20 MG tablet Take 1 tablet (20 mg total) by mouth daily.   QUEtiapine  (SEROQUEL ) 300 MG tablet TAKE 1/2 TO 1 TABLET BY MOUTH AT BEDTIME   rosuvastatin  (CRESTOR ) 10 MG tablet TAKE 1 TABLET BY MOUTH DAILY   tadalafil (CIALIS)  5 MG tablet Take 5 mg by mouth daily.   tamsulosin  (FLOMAX ) 0.4 MG CAPS capsule Take 0.4 mg by mouth.   valACYclovir  (VALTREX ) 1000 MG tablet TAKE ONE (1) TABLET BY MOUTH TWO TIMES PER DAY. TAKE FOR ONE DAY AT ONSET OF BREAKOUT.   [DISCONTINUED] albuterol  (VENTOLIN  HFA) 108 (90 Base) MCG/ACT inhaler Inhale 2 puffs into the lungs every 6 (six) hours as needed for wheezing or shortness of breath.   [DISCONTINUED] azelastine  (ASTELIN ) 0.1 % nasal spray Place 2 sprays into both nostrils 2  (two) times daily. Use in each nostril as directed   [DISCONTINUED] benzonatate  (TESSALON  PERLES) 100 MG capsule Take 2 capsules (200 mg total) by mouth 3 (three) times daily as needed.   [DISCONTINUED] chlorpheniramine-HYDROcodone  (TUSSIONEX) 10-8 MG/5ML Take 5 mLs by mouth every 12 (twelve) hours as needed for cough.   [DISCONTINUED] fluticasone  (FLONASE ) 50 MCG/ACT nasal spray Place 2 sprays into both nostrils in the morning and at bedtime.   [DISCONTINUED] levocetirizine (XYZAL ) 5 MG tablet Take 1 tablet (5 mg total) by mouth every evening.   No facility-administered encounter medications on file as of 12/14/2023.     Lab Results  Component Value Date   WBC 4.6 12/12/2023   HGB 12.1 (L) 12/12/2023   HCT 35.8 (L) 12/12/2023   PLT 240.0 12/12/2023   GLUCOSE 94 12/12/2023   CHOL 163 12/12/2023   TRIG 56.0 12/12/2023   HDL 79.70 12/12/2023   LDLCALC 72 12/12/2023   ALT 12 12/12/2023   AST 19 12/12/2023   NA 140 12/12/2023   K 4.4 12/12/2023   CL 103 12/12/2023   CREATININE 0.71 12/12/2023   BUN 12 12/12/2023   CO2 30 12/12/2023   TSH 2.04 08/03/2023   PSA 16.07 (H) 09/29/2022   INR 1.03 05/29/2009   HGBA1C 5.6 12/12/2023    MR Lumbar Spine W Wo Contrast Result Date: 12/01/2023 EXAM: MRI LUMBAR SPINE WITHOUT AND WITH CONTRAST TECHNIQUE: Multiplanar and multiecho pulse sequences of the lumbar spine were obtained without and with intravenous contrast. CONTRAST:  7  mL of vueway  IV COMPARISON:  MRI of the lumbar spine dated 02/22/2021 FINDINGS: Segmentation: Standard. Alignment:  Physiologic lumbar alignment is maintained. Vertebrae: Operative changes of anterior and posterior fusion at L4 through S1. Degenerative endplate marrow changes at a few levels. Intraosseous hemangioma at T12. No compression fractures. Conus medullaris and cauda equina: The conus medullaris terminates at the level of L1-L2. The distal spinal cord signal intensity is normal. Paraspinal and other soft tissues:  Renal cysts bilaterally. The visualized aorta is normal. Disc levels: L1-L2: Mild disc bulge. Moderate bilateral facet arthropathy. No neuroforaminal stenosis. No spinal canal stenosis. L2-L3: Disc bulge with right subarticular protrusion. Moderate bilateral facet arthropathy. Moderate right and mild left neuroforaminal stenosis. Mild spinal canal stenosis. L3-L4: Disc bulge. Moderate bilateral facet arthropathy. Moderate bilateral neuroforaminal stenosis. Moderate spinal canal stenosis. L4-L5: Interbody fusion. Facets are fused. Mild bilateral neuroforaminal stenosis. Mild spinal canal stenosis. L5-S1: Interbody fusion. Moderate bilateral facet arthropathy. Mild bilateral neuroforaminal stenosis. No spinal canal stenosis. IMPRESSION: 1. Operative changes of anterior and posterior fusion at L4 through S1. No residual stenosis at L4-L5 and L5-S1. 2. Moderate spinal canal stenosis and moderate bilateral neuroforaminal stenosis at L3-L4 secondary to disc bulging and facet arthropathy 3. Moderate right neuroforaminal stenosis at L2-L3. Electronically Signed   By: Clem Savory M.D.   On: 12/01/2023 10:49       Assessment & Plan:  Anemia, unspecified type Assessment & Plan: CBCs  have previously been followed by oncology.  Recent CBC here revealed a hemoglobin of 12.1. relatively stable. Reviewed iron studies. Follow cbc.   Orders: -     CBC with Differential/Platelet; Future -     IBC + Ferritin; Future -     Vitamin B12; Future  Hypercholesteremia Assessment & Plan: Continue crestor .  Low cholesterol diet and exercise.  Follow lipid panel and liver function tests. Discussed recent labs. No changes in medication today.   Orders: -     Basic metabolic panel with GFR; Future -     Hepatic function panel; Future -     Lipid panel; Future  Hyperglycemia Assessment & Plan: Continue low-carb diet and exercise.  Recent A1c within normal limits at 5.6.  Follow.  Orders: -     Hemoglobin A1c;  Future  Aortic atherosclerosis (HCC) Assessment & Plan: Continue crestor .    Chronic neck pain (Primary Area of Pain) Assessment & Plan: Just saw neurosurgery as outlined.  Recommended starting meloxicam .  Discussed possible side effects and risk of medication.  Will monitor.  Continue follow-up with neurosurgery.   Other fatigue Assessment & Plan: Has been a persistent issue for him.  He appears to be doing better.  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  He is walking 5 miles a day now.  Exercise him.  Follow.   Major depressive disorder, single episode, mild (HCC) Assessment & Plan: Continues on Seroquel .  Appears to be doing well.  Exercising.  Followed.   Mild cognitive impairment Assessment & Plan: Saw neurology. Continue aricept . Follow.    Prostate cancer [facility]) Assessment & Plan: Being followed by oncology. Following - active surveillance.       Allena Hamilton, MD

## 2023-12-14 NOTE — Assessment & Plan Note (Signed)
 Saw neurology. Continue aricept. Follow.

## 2023-12-14 NOTE — Assessment & Plan Note (Signed)
 Being followed by oncology. Following - active surveillance.

## 2023-12-14 NOTE — Assessment & Plan Note (Signed)
 CBCs have previously been followed by oncology.  Recent CBC here revealed a hemoglobin of 12.1. relatively stable. Reviewed iron studies. Follow cbc.

## 2023-12-21 ENCOUNTER — Other Ambulatory Visit

## 2024-01-31 ENCOUNTER — Ambulatory Visit (INDEPENDENT_AMBULATORY_CARE_PROVIDER_SITE_OTHER): Payer: Medicare HMO | Admitting: *Deleted

## 2024-01-31 VITALS — Ht 70.0 in | Wt 158.0 lb

## 2024-01-31 DIAGNOSIS — Z0000 Encounter for general adult medical examination without abnormal findings: Secondary | ICD-10-CM | POA: Diagnosis not present

## 2024-01-31 NOTE — Progress Notes (Signed)
 Subjective:   Mike Farley is a 73 y.o. who presents for a Medicare Wellness preventive visit.  As a reminder, Annual Wellness Visits don't include a physical exam, and some assessments may be limited, especially if this visit is performed virtually. We may recommend an in-person follow-up visit with your provider if needed.  Visit Complete: Virtual I connected with  Mike Farley on 01/31/24 by a audio enabled telemedicine application and verified that I am speaking with the correct person using two identifiers.  Patient Location: Home  Provider Location: Home Office  I discussed the limitations of evaluation and management by telemedicine. The patient expressed understanding and agreed to proceed.  Vital Signs: Because this visit was a virtual/telehealth visit, some criteria may be missing or patient reported. Any vitals not documented were not able to be obtained and vitals that have been documented are patient reported.  VideoDeclined- This patient declined Librarian, academic. Therefore the visit was completed with audio only.  Persons Participating in Visit: Patient.  AWV Questionnaire: No: Patient Medicare AWV questionnaire was not completed prior to this visit.  Cardiac Risk Factors include: advanced age (>53men, >43 women);dyslipidemia;male gender     Objective:    Today's Vitals   01/31/24 1012 01/31/24 1013  Weight: 158 lb (71.7 kg)   Height: 5' 10 (1.778 m)   PainSc:  6    Body mass index is 22.67 kg/m.     01/31/2024   10:31 AM 01/19/2023   11:37 AM 11/19/2021   11:14 AM 10/06/2021    6:55 AM 04/24/2021    2:00 PM 04/23/2021    6:00 PM 04/21/2021    2:16 PM  Advanced Directives  Does Patient Have a Medical Advance Directive? Yes Yes Yes No  Yes Yes  Type of Estate agent of [place];Living will Healthcare Power of [place];Living will Healthcare Power of [place];Living will  Healthcare Power of  [employer] of [place];Living will Healthcare Power of [place];Living will  Does patient want to make changes to medical advance directive?   No - Patient declined  No - Patient declined    Copy of Healthcare Power of Attorney in Chart? No - copy requested No - copy requested No - copy requested        Current Medications (verified) Outpatient Encounter Medications as of 01/31/2024  Medication Sig   acetaminophen  (TYLENOL ) 325 MG tablet Take 325 mg by mouth 4 (four) times daily.   cholecalciferol (VITAMIN D3) 25 MCG (1000 UT) tablet Take 1,000 Units by mouth 3 (three) times a week.    cyanocobalamin  1000 MCG tablet Take 1,000 mcg by mouth daily.    donepezil  (ARICEPT ) 5 MG tablet Take 5 mg by mouth daily.   Melatonin 10 MG TABS Take by mouth.   meloxicam  (MOBIC ) 7.5 MG tablet Take 7.5 mg by mouth daily. (Patient taking differently: Take 7.5 mg by mouth daily as needed.)   methocarbamol  (ROBAXIN ) 500 MG tablet Take 500 mg by mouth 3 (three) times daily. (Patient taking differently: Take 500 mg by mouth 3 (three) times daily as needed.)   Multiple Vitamin (MULTIVITAMIN) tablet Take 1 tablet by mouth daily. ABC Complete MEN   Oxycodone  HCl 10 MG TABS 10 mg 4 (four) times daily   pantoprazole  (PROTONIX ) 20 MG tablet Take 1 tablet (20 mg total) by mouth daily.   QUEtiapine  (SEROQUEL ) 300 MG tablet TAKE 1/2 TO 1 TABLET BY MOUTH AT BEDTIME   rosuvastatin  (CRESTOR ) 10 MG tablet  TAKE 1 TABLET BY MOUTH DAILY   tadalafil (CIALIS) 5 MG tablet Take 5 mg by mouth daily.   tamsulosin  (FLOMAX ) 0.4 MG CAPS capsule Take 0.4 mg by mouth.   valACYclovir  (VALTREX ) 1000 MG tablet TAKE ONE (1) TABLET BY MOUTH TWO TIMES PER DAY. TAKE FOR ONE DAY AT ONSET OF BREAKOUT.   No facility-administered encounter medications on file as of 01/31/2024.    Allergies (verified) Morphine, Amoxicillin, and Fentanyl    History: Past Medical History:  Diagnosis Date   Anxiety    Arthritis    shoulder   Cancer  (HCC)    prostate ca - Md just watching   Cervical pain (neck)    limited mobility   Chronic pain    Decreased ROM of neck    Depression    years ago (minor)   Diverticulosis    Duodenal diverticulum    Elevated PSA    Hemorrhoids    Hiatal hernia    History of chicken pox    HLD (hyperlipidemia)    Hx of cold sores    Internal hemorrhoids    Mild cognitive impairment with memory loss    Neuropathy    Past Surgical History:  Procedure Laterality Date   ABDOMINAL EXPOSURE N/A 04/23/2021   Procedure: ABDOMINAL EXPOSURE;  Surgeon: Gretta Lonni PARAS, MD;  Location: [facility];  Service: Vascular;  Laterality: N/A;   ANTERIOR FUSION CERVICAL SPINE     4-5 levels; has had 3-4 surgeries for this   ANTERIOR LUMBAR FUSION N/A 04/23/2021   Procedure: Lumbar four-five,  Lumbar five-Sacral one Anterior Lumbar Interbody Fusion;  Surgeon: Dawley, Lani BROCKS, DO;  Location: [facility] OR;  Service: Neurosurgery;  Laterality: N/A;   APPENDECTOMY     BACK SURGERY     BACK SURGERY  04/2022   BONE EXCISION Left 09/28/2017   Procedure: BONE EXCISION-TAILORS  EXOSTECTOMY;  Surgeon: Ashley Soulier, DPM;  Location: [facility];  Service: Podiatry;  Laterality: Left;  IVA LOCAL   CERVICAL DISCECTOMY  2008   C2-C7 Fused    CHOLECYSTECTOMY     COLONOSCOPY  2020   COLONOSCOPY  2019   [facility] Due 03/2022 per patient   ELBOW FRACTURE SURGERY Left 1990   ESOPHAGOGASTRODUODENOSCOPY (EGD) WITH PROPOFOL  N/A 10/06/2021   Procedure: ESOPHAGOGASTRODUODENOSCOPY (EGD) WITH PROPOFOL ;  Surgeon: Maryruth Ole DASEN, MD;  Location: [facility];  Service: Endoscopy;  Laterality: N/A;   LUMBAR PERCUTANEOUS PEDICLE SCREW 2 LEVEL N/A 04/23/2021   Procedure: LUMBAR PERCUTANEOUS PEDICLE SCREW LUMBAR FOUR-SACRAL ONE;  Surgeon: Carollee Lani BROCKS, DO;  Location: [facility];  Service: Neurosurgery;  Laterality: N/A;   METATARSAL HEAD EXCISION Left 07/04/2020   Procedure: METATARSAL HEAD EXCISION;   Surgeon: Ashley Soulier, DPM;  Location: [facility];  Service: Podiatry;  Laterality: Left;   PROSTATE BIOPSY N/A 03/15/2019   Procedure: PROSTATE BIOPSY Uro Nav;  Surgeon: Kassie Ozell SAUNDERS, MD;  Location: [facility];  Service: Urology;  Laterality: N/A;   RADIOLOGY WITH ANESTHESIA N/A 04/22/2020   Procedure: MRI WITH ANESTHESIA BRAIN WITHOUT CONTRAST;  Surgeon: Radiologist, Medication, MD;  Location: [facility];  Service: Radiology;  Laterality: N/A;   REPLACEMENT TOTAL KNEE Right    right knee   ROTATOR CUFF REPAIR Right 2018   with arthroscopy and distal claviculectomy   TONSILLECTOMY AND ADENOIDECTOMY     TOTAL KNEE ARTHROPLASTY Right 2005   x 4   Family History  Problem Relation Age of Onset   Arthritis Mother  Breast cancer Mother    Mental illness Mother    Colon cancer Mother    Bipolar disorder Mother    Depression Mother    Colon polyps Mother    Lung cancer Father    Prostate cancer Father    Alcohol abuse Brother    Kidney disease Brother    Esophageal cancer Neg Hx    Social History   Socioeconomic History   Marital status: Married    Spouse name: Not on file   Number of children: Not on file   Years of education: Not on file   Highest education level: Bachelor's degree (e.g., BA, AB, BS)  Occupational History   Not on file  Tobacco Use   Smoking status: Former   Smokeless tobacco: Former    Types: Chew   Tobacco comments:    Quit 50 yrs ago for smoking and quit chewing 2 months ago  Vaping Use   Vaping status: Never Used  Substance and Sexual Activity   Alcohol use: Yes    Alcohol/week: 7.0 standard drinks of alcohol    Types: 7 Standard drinks or equivalent per week    Comment: beer/wine   Drug use: Never   Sexual activity: Not Currently  Other Topics Concern   Not on file  Social History Narrative   Married   Social Drivers of Health   Financial Resource Strain: Low Risk  (01/31/2024)   Overall Financial Resource Strain (CARDIA)    Difficulty of  Paying Living Expenses: Not hard at all  Food Insecurity: No Food Insecurity (01/31/2024)   Hunger Vital Sign    Worried About Running Out of Food in the Last Year: Never true    Ran Out of Food in the Last Year: Never true  Transportation Needs: No Transportation Needs (01/31/2024)   PRAPARE - Administrator, Civil Service (Medical): No    Lack of Transportation (Non-Medical): No  Physical Activity: Sufficiently Active (01/31/2024)   Exercise Vital Sign    Days of Exercise per Week: 5 days    Minutes of Exercise per Session: 60 min  Stress: No Stress Concern Present (01/31/2024)   [employer] of Occupational Health - Occupational Stress Questionnaire    Feeling of Stress: Only a little  Social Connections: Socially Integrated (01/31/2024)   Social Connection and Isolation Panel    Frequency of Communication with Friends and Family: More than three times a week    Frequency of Social Gatherings with Friends and Family: More than three times a week    Attends Religious Services: More than 4 times per year    Active Member of [employer] or Organizations: Yes    Attends Engineer, structural: More than 4 times per year    Marital Status: Married    Tobacco Counseling Counseling given: Not Answered Tobacco comments: Quit 50 yrs ago for smoking and quit chewing 2 months ago    Clinical Intake:  Pre-visit preparation completed: Yes  Pain : 0-10 Pain Score: 6  Pain Type: Chronic pain Pain Location: Neck Pain Descriptors / Indicators: Aching Pain Onset: More than a month ago Pain Frequency: Constant     BMI - recorded: 22.67 Nutritional Status: BMI of 19-24  Normal Nutritional Risks: None Diabetes: No  Lab Results  Component Value Date   HGBA1C 5.6 12/12/2023   HGBA1C 5.6 08/03/2023   HGBA1C 5.7 04/01/2023     How often do you need to have someone help you when you read instructions,  pamphlets, or other written materials from your doctor or pharmacy?: 1 -  Never  Interpreter Needed?: No  Information entered by :: R. Ariston Grandison LPN   Activities of Daily Living     01/31/2024   10:15 AM  In your present state of health, do you have any difficulty performing the following activities:  Hearing? 1  Vision? 0  Difficulty concentrating or making decisions? 1  Walking or climbing stairs? 0  Dressing or bathing? 0  Doing errands, shopping? 0  Preparing Food and eating ? N  Using the Toilet? N  In the past six months, have you accidently leaked urine? N  Do you have problems with loss of bowel control? N  Managing your Medications? N  Managing your Finances? N  Housekeeping or managing your Housekeeping? N    Patient Care Team: Glendia Shad, MD as PCP - General (Internal Medicine) Pyrtle, Gordy HERO, MD as Consulting Physician (Gastroenterology) Evangeline Bernardino Ancona, MD as Referring Physician (Radiation Oncology) Casimir Rodgers SAILOR, MD as Referring Physician (Urology) Dawley, Lani BROCKS, DO (Inactive) as Consulting Physician Gillie Duncans, MD as Consulting Physician (Neurosurgery)  I have updated your Care Teams any recent Medical Services you may have received from other providers in the past year.     Assessment:   This is a routine wellness examination for Khasir.  Hearing/Vision screen Hearing Screening - Comments:: Some issues Vision Screening - Comments:: glasses   Goals Addressed             This Visit's Progress    Patient Stated       [facility]       Depression Screen     01/31/2024   10:25 AM 09/19/2023    3:16 PM 08/22/2023    1:10 PM 08/03/2023   12:37 PM 04/29/2023   11:50 AM 01/19/2023   11:31 AM 03/11/2022   11:48 AM  PHQ 2/9 Scores  PHQ - 2 Score 1 3 6 2 1 4 2   PHQ- 9 Score 3 11 15 5 5 10 7     Fall Risk     01/31/2024   10:18 AM 08/22/2023    1:10 PM 01/17/2023   11:43 AM 03/11/2022   11:48 AM 12/16/2021    8:17 AM  Fall Risk   Falls in the past year? 0 0 0 0 0  Number falls in past  yr: 0 0 0 0 0  Injury with Fall? 0 0 0 0 0  Risk for fall due to : No Fall Risks No Fall Risks No Fall Risks No Fall Risks No Fall Risks  Follow up Falls evaluation completed;Falls prevention discussed Falls evaluation completed Falls prevention discussed;Falls evaluation completed Falls evaluation completed  Falls evaluation completed      Data saved with a previous flowsheet row definition    MEDICARE RISK AT HOME:  Medicare Risk at Home Any stairs in or around the home?: Yes If so, are there any without handrails?: No Home free of loose throw rugs in walkways, pet beds, electrical cords, etc?: Yes Adequate lighting in your home to reduce risk of falls?: Yes Life alert?: No Use of a cane, walker or w/c?: No Grab bars in the bathroom?: Yes Shower chair or bench in shower?: No Elevated toilet seat or a handicapped toilet?: Yes  TIMED UP AND GO:  Was the test performed?  No  Cognitive Function: 6CIT completed    10/06/2017   11:38 AM  MMSE -  Mini Mental State Exam  Orientation to time 5  Orientation to Place 5  Registration 3  Attention/ Calculation 5  Recall 3  Language- name 2 objects 2  Language- repeat 1  Language- follow 3 step command 3  Language- read & follow direction 1  Write a sentence 1  Copy design 1  Total score 30        01/31/2024   10:31 AM 01/19/2023   11:37 AM 11/02/2019    3:02 PM  6CIT Screen  What Year? 0 points 0 points 0 points  What month? 0 points 0 points 0 points  What time? 0 points 0 points   Count back from 20 0 points 0 points   Months in reverse 0 points 0 points 0 points  Repeat phrase 0 points 0 points 0 points  Total Score 0 points 0 points     Immunizations Immunization History  Administered Date(s) Administered    sv, Bivalent, Protein Subunit Rsvpref,pf (Abrysvo) 03/12/2022   INFLUENZA, HIGH DOSE SEASONAL PF 03/12/2020, 02/25/2021, 02/24/2022, 03/16/2023   Influenza-Unspecified 05/04/2017, 03/13/2018, 02/19/2019    Moderna Covid-19 Fall Seasonal Vaccine 20yrs & older 03/16/2023   PFIZER Comirnaty(Gray Top)Covid-19 Tri-Sucrose Vaccine 03/29/2020, 09/23/2020, 02/24/2022   PFIZER(Purple Top)SARS-COV-2 Vaccination 05/28/2019, 06/18/2019, 03/29/2020   Pneumococcal Conjugate-13 06/27/2015, 02/05/2021   Pneumococcal Polysaccharide-23 05/26/2012, 06/24/2017   Tdap 10/21/2015, 10/07/2020   Zoster Recombinant(Shingrix) 05/08/2019, 09/04/2019    Screening Tests Health Maintenance  Topic Date Due   Influenza Vaccine  12/23/2023   Medicare Annual Wellness (AWV)  01/19/2024   COVID-19 Vaccine (8 - Pfizer risk 2024-25 season) 01/23/2024   DTaP/Tdap/Td (3 - Td or Tdap) 10/08/2030   Pneumococcal Vaccine: 50+ Years  Completed   Hepatitis C Screening  Completed   Zoster Vaccines- Shingrix  Completed   HPV VACCINES  Aged Out   Meningococcal B Vaccine  Aged Out   Colonoscopy  Discontinued    Health Maintenance Items Addressed: Discussed the need to update flu ad covid vaccines.  Additional Screening:  Vision Screening: Recommended annual ophthalmology exams for early detection of glaucoma and other disorders of the eye. Is the patient up to date with their annual eye exam?  Yes  Who is the provider or what is the name of the office in which the patient attends annual eye exams? [facility]  Dental Screening: Recommended annual dental exams for proper oral hygiene  Community Resource Referral / Chronic Care Management: CRR required this visit?  No   CCM required this visit?  No   Plan:    I have personally reviewed and noted the following in the patient's chart:   Medical and social history Use of alcohol, tobacco or illicit drugs  Current medications and supplements including opioid prescriptions. Patient is currently taking opioid prescriptions. Information provided to patient regarding non-opioid alternatives. Patient advised to discuss non-opioid treatment plan with their provider. Functional  ability and status Nutritional status Physical activity Advanced directives List of other physicians Hospitalizations, surgeries, and ER visits in previous 12 months Vitals Screenings to include cognitive, depression, and falls Referrals and appointments  In addition, I have reviewed and discussed with patient certain preventive protocols, quality metrics, and best practice recommendations. A written personalized care plan for preventive services as well as general preventive health recommendations were provided to patient.   Angeline Fredericks, LPN   [id number]   After Visit Summary: (MyChart) Due to this being a telephonic visit, the after visit summary with patients personalized plan was offered to  patient via MyChart   Notes: Nothing significant to report at this time.

## 2024-01-31 NOTE — Patient Instructions (Signed)
 Mike Farley,  Thank you for taking the time for your Medicare Wellness Visit. I appreciate your continued commitment to your health goals. Please review the care plan we discussed, and feel free to reach out if I can assist you further.  Medicare recommends these wellness visits once per year to help you and your care team stay ahead of potential health issues. These visits are designed to focus on prevention, allowing your provider to concentrate on managing your acute and chronic conditions during your regular appointments.  Please note that Annual Wellness Visits do not include a physical exam. Some assessments may be limited, especially if the visit was conducted virtually. If needed, we may recommend a separate in-person follow-up with your provider.  Ongoing Care Seeing your primary care provider every 3 to 6 months helps us  monitor your health and provide consistent, personalized care. Remember to get your flu and covid vaccines as discussed,  Referrals If a referral was made during today's visit and you haven't received any updates within two weeks, please contact the referred provider directly to check on the status.  Recommended Screenings:  Health Maintenance  Topic Date Due   Flu Shot  12/23/2023   COVID-19 Vaccine (8 - Pfizer risk 2024-25 season) 01/23/2024   Medicare Annual Wellness Visit  01/30/2025   DTaP/Tdap/Td vaccine (3 - Td or Tdap) 10/08/2030   Pneumococcal Vaccine for age over 47  Completed   Hepatitis C Screening  Completed   Zoster (Shingles) Vaccine  Completed   HPV Vaccine  Aged Out   Meningitis B Vaccine  Aged Out   Colon Cancer Screening  Discontinued       01/31/2024   10:31 AM  Advanced Directives  Does Patient Have a Medical Advance Directive? Yes  Type of Estate agent of [place];Living will  Copy of Healthcare Power of Attorney in Chart? No - copy requested   Advance Care Planning is important because it: Ensures you  receive medical care that aligns with your values, goals, and preferences. Provides guidance to your family and loved ones, reducing the emotional burden of decision-making during critical moments.  Vision: Annual vision screenings are recommended for early detection of glaucoma, cataracts, and diabetic retinopathy. These exams can also reveal signs of chronic conditions such as diabetes and high blood pressure.  Dental: Annual dental screenings help detect early signs of oral cancer, gum disease, and other conditions linked to overall health, including heart disease and diabetes.  Please see the attached documents for additional preventive care recommendations.  Managing Pain Without Opioids Opioids are strong medicines used to treat moderate to severe pain. For some people, especially those who have long-term (chronic) pain, opioids may not be the best choice for pain management due to: Side effects like nausea, constipation, and sleepiness. The risk of addiction (opioid use disorder). The longer you take opioids, the greater your risk of addiction. Pain that lasts for more than 3 months is called chronic pain. Managing chronic pain usually requires more than one approach and is often provided by a team of health care providers working together (multidisciplinary approach). Pain management may be done at a pain management center or pain clinic. How to manage pain without the use of opioids Use non-opioid medicines Non-opioid medicines for pain may include: Over-the-counter or prescription non-steroidal anti-inflammatory drugs (NSAIDs). These may be the first medicines used for pain. They work well for muscle and bone pain, and they reduce swelling. Acetaminophen . This over-the-counter medicine may work well for  milder pain but not swelling. Antidepressants. These may be used to treat chronic pain. A certain type of antidepressant (tricyclics) is often used. These medicines are given in lower  doses for pain than when used for depression. Anticonvulsants. These are usually used to treat seizures but may also reduce nerve (neuropathic) pain. Muscle relaxants. These relieve pain caused by sudden muscle tightening (spasms). You may also use a pain medicine that is applied to the skin as a patch, cream, or gel (topical analgesic), such as a numbing medicine. These may cause fewer side effects than medicines taken by mouth. Do certain therapies as directed Some therapies can help with pain management. They include: Physical therapy. You will do exercises to gain strength and flexibility. A physical therapist may teach you exercises to move and stretch parts of your body that are weak, stiff, or painful. You can learn these exercises at physical therapy visits and practice them at home. Physical therapy may also involve: Massage. Heat wraps or applying heat or cold to affected areas. Electrical signals that interrupt pain signals (transcutaneous electrical nerve stimulation, TENS). Weak lasers that reduce pain and swelling (low-level laser therapy). Signals from your body that help you learn to regulate pain (biofeedback). Occupational therapy. This helps you to learn ways to function at home and work with less pain. Recreational therapy. This involves trying new activities or hobbies, such as a physical activity or drawing. Mental health therapy, including: Cognitive behavioral therapy (CBT). This helps you learn coping skills for dealing with pain. Acceptance and commitment therapy (ACT) to change the way you think and react to pain. Relaxation therapies, including muscle relaxation exercises and mindfulness-based stress reduction. Pain management counseling. This may be individual, family, or group counseling.  Receive medical treatments Medical treatments for pain management include: Nerve block injections. These may include a pain blocker and anti-inflammatory medicines. You may have  injections: Near the spine to relieve chronic back or neck pain. Into joints to relieve back or joint pain. Into nerve areas that supply a painful area to relieve body pain. Into muscles (trigger point injections) to relieve some painful muscle conditions. A medical device placed near your spine to help block pain signals and relieve nerve pain or chronic back pain (spinal cord stimulation device). Acupuncture. Follow these instructions at home Medicines Take over-the-counter and prescription medicines only as told by your health care provider. If you are taking pain medicine, ask your health care providers about possible side effects to watch out for. Do not drive or use heavy machinery while taking prescription opioid pain medicine. Lifestyle  Do not use drugs or alcohol to reduce pain. If you drink alcohol, limit how much you have to: 0-1 drink a day for women who are not pregnant. 0-2 drinks a day for men. Know how much alcohol is in a drink. In the [place] one drink equals one 12 oz bottle of beer (355 mL), one 5 oz glass of wine (148 mL), or one 1 oz glass of hard liquor (44 mL). Do not use any products that contain nicotine or tobacco. These products include cigarettes, chewing tobacco, and vaping devices, such as e-cigarettes. If you need help quitting, ask your health care provider. Eat a healthy diet and maintain a healthy weight. Poor diet and excess weight may make pain worse. Eat foods that are high in fiber. These include fresh fruits and vegetables, whole grains, and beans. Limit foods that are high in fat and processed sugars, such as fried and sweet  foods. Exercise regularly. Exercise lowers stress and may help relieve pain. Ask your health care provider what activities and exercises are safe for you. If your health care provider approves, join an exercise class that combines movement and stress reduction. Examples include yoga and tai chi. Get enough sleep. Lack of sleep may  make pain worse. Lower stress as much as possible. Practice stress reduction techniques as told by your therapist. General instructions Work with all your pain management providers to find the treatments that work best for you. You are an important member of your pain management team. There are many things you can do to reduce pain on your own. Consider joining an online or in-person support group for people who have chronic pain. Keep all follow-up visits. This is important. Where to find more information You can find more information about managing pain without opioids from: American Academy of Pain Medicine: painmed.org Institute for Chronic Pain: instituteforchronicpain.org American Chronic Pain Association: theacpa.org Contact a health care provider if: You have side effects from pain medicine. Your pain gets worse or does not get better with treatments or home therapy. You are struggling with anxiety or depression. Summary Many types of pain can be managed without opioids. Chronic pain may respond better to pain management without opioids. Pain is best managed when you and a team of health care providers work together. Pain management without opioids may include non-opioid medicines, medical treatments, physical therapy, mental health therapy, and lifestyle changes. Tell your health care providers if your pain gets worse or is not being managed well enough. This information is not intended to replace advice given to you by your health care provider. Make sure you discuss any questions you have with your health care provider. Document Revised: 08/20/2020 Document Reviewed: 08/20/2020 Elsevier Patient Education  2024 [employer].

## 2024-02-01 DIAGNOSIS — G894 Chronic pain syndrome: Secondary | ICD-10-CM | POA: Diagnosis not present

## 2024-02-01 DIAGNOSIS — M4802 Spinal stenosis, cervical region: Secondary | ICD-10-CM | POA: Diagnosis not present

## 2024-02-01 DIAGNOSIS — M4721 Other spondylosis with radiculopathy, occipito-atlanto-axial region: Secondary | ICD-10-CM | POA: Diagnosis not present

## 2024-02-01 DIAGNOSIS — M47812 Spondylosis without myelopathy or radiculopathy, cervical region: Secondary | ICD-10-CM | POA: Diagnosis not present

## 2024-02-15 ENCOUNTER — Encounter: Payer: Self-pay | Admitting: Internal Medicine

## 2024-02-16 ENCOUNTER — Encounter: Payer: Self-pay | Admitting: Internal Medicine

## 2024-02-16 ENCOUNTER — Telehealth: Admitting: Family

## 2024-02-16 DIAGNOSIS — J069 Acute upper respiratory infection, unspecified: Secondary | ICD-10-CM | POA: Diagnosis not present

## 2024-02-16 MED ORDER — DOXYCYCLINE HYCLATE 100 MG PO TABS: 100.0000 mg | ORAL_TABLET | Freq: Two times a day (BID) | ORAL | 0 refills | Status: DC

## 2024-02-16 MED ORDER — BENZONATATE 100 MG PO CAPS: 100.0000 mg | ORAL_CAPSULE | Freq: Three times a day (TID) | ORAL | 0 refills | Status: DC | PRN

## 2024-02-16 NOTE — Addendum Note (Signed)
 Addended by: LAVELL LYE A on: 02/16/2024 05:22 PM   Modules accepted: Orders

## 2024-02-16 NOTE — Telephone Encounter (Signed)
 Copied from CRM [id number]. Topic: Clinical - Medical Advice >> Feb 16, 2024 10:55 AM Franky GRADE wrote: Reason for CRM: Patient's spouse is calling to speak with Dr.Scott's medical assistant to ask regarding over the counter medication he can take for Congestion and cough.

## 2024-02-16 NOTE — Telephone Encounter (Signed)
 See if he can do a virtual visit tomorrow as we discussed.

## 2024-02-16 NOTE — Telephone Encounter (Signed)
 Patient called wanting abx today. Advised would need visit today. Pt is going to do a virtual urgent care visit. If not able to do so- will keep appt with Dr Glendia. Pt was ok with this plan

## 2024-02-16 NOTE — Progress Notes (Signed)
Approximately 5 minutes was spent documenting and reviewing patient's chart.

## 2024-02-16 NOTE — Telephone Encounter (Signed)
 Copied from CRM [id number]. Topic: Clinical - Medical Advice >> Feb 16, 2024  1:21 PM Terri G wrote: Reason for CRM: Patient is calling back because she wanted Dr.Scott nurse Trish to contact her, advised they are seeing patients at the moment. Cal was able to schedule that virtual appointment tomorrow. But patient wife still wants that call today if possible . Callback number  [phone]

## 2024-02-16 NOTE — Progress Notes (Signed)

## 2024-02-17 ENCOUNTER — Ambulatory Visit: Admitting: Internal Medicine

## 2024-02-20 ENCOUNTER — Ambulatory Visit: Admitting: Internal Medicine

## 2024-02-22 ENCOUNTER — Encounter: Payer: Self-pay | Admitting: Internal Medicine

## 2024-02-22 ENCOUNTER — Other Ambulatory Visit: Payer: Self-pay | Admitting: Internal Medicine

## 2024-02-23 ENCOUNTER — Encounter: Payer: Self-pay | Admitting: Internal Medicine

## 2024-02-23 MED ORDER — LEVOCETIRIZINE DIHYDROCHLORIDE 5 MG PO TABS: 5.0000 mg | ORAL_TABLET | Freq: Every evening | ORAL | 0 refills | Status: DC

## 2024-02-23 MED ORDER — QUETIAPINE FUMARATE 300 MG PO TABS: 150.0000 mg | ORAL_TABLET | Freq: Every day | ORAL | 1 refills | Status: DC

## 2024-02-23 NOTE — Telephone Encounter (Signed)
 Rx sent in for seroquel  and xyzal . Notified via my chart.

## 2024-02-23 NOTE — Telephone Encounter (Signed)
 Pt requesting a refill for xyzal , not on current med list

## 2024-02-23 NOTE — Telephone Encounter (Signed)
 Seroquel  - already refilled.

## 2024-02-24 ENCOUNTER — Ambulatory Visit (INDEPENDENT_AMBULATORY_CARE_PROVIDER_SITE_OTHER)

## 2024-02-24 ENCOUNTER — Ambulatory Visit: Admitting: Nurse Practitioner

## 2024-02-24 ENCOUNTER — Telehealth: Payer: Self-pay

## 2024-02-24 ENCOUNTER — Encounter: Payer: Self-pay | Admitting: Nurse Practitioner

## 2024-02-24 ENCOUNTER — Ambulatory Visit: Payer: Self-pay | Admitting: Nurse Practitioner

## 2024-02-24 VITALS — BP 128/72 | HR 102 | Temp 99.0°F | Ht 70.0 in | Wt 159.6 lb

## 2024-02-24 DIAGNOSIS — J302 Other seasonal allergic rhinitis: Secondary | ICD-10-CM

## 2024-02-24 DIAGNOSIS — J069 Acute upper respiratory infection, unspecified: Secondary | ICD-10-CM

## 2024-02-24 DIAGNOSIS — R059 Cough, unspecified: Secondary | ICD-10-CM | POA: Diagnosis not present

## 2024-02-24 LAB — CBC
HCT: 37.9 % — ABNORMAL LOW (ref 39.0–52.0)
Hemoglobin: 12.7 g/dL — ABNORMAL LOW (ref 13.0–17.0)
MCHC: 33.5 g/dL (ref 30.0–36.0)
MCV: 92.4 fl (ref 78.0–100.0)
Platelets: 246 K/uL (ref 150.0–400.0)
RBC: 4.1 Mil/uL — ABNORMAL LOW (ref 4.22–5.81)
RDW: 13.2 % (ref 11.5–15.5)
WBC: 6.6 K/uL (ref 4.0–10.5)

## 2024-02-24 MED ORDER — PREDNISONE 20 MG PO TABS: 40.0000 mg | ORAL_TABLET | Freq: Every day | ORAL | 0 refills | Status: AC

## 2024-02-24 NOTE — Progress Notes (Signed)
 Please call pt  The chest x ray is clear. I have sent prednisone , he do not have to change the antibiotic.  If symptoms not improving he should let us  know.

## 2024-02-24 NOTE — Progress Notes (Signed)
 Established Patient Office Visit  Subjective:  Patient ID: Mike Farley, male    DOB: 1950-06-01  Age: 73 y.o. MRN: [id number]  CC:  Chief Complaint  Patient presents with   Acute Visit    Sinus chest congestion Started 2 weeks ago as sinus now moved into his chest Productive cough sometimes   Discussed the use of a AI scribe software for clinical note transcription with the patient, who gave verbal consent to proceed.  HPI  Mike Farley presents for acute visit for URI symptoms.   He has experienced respiratory symptoms for two weeks, starting with sinus congestion and progressing to yellowish nasal discharge and brownish-yellow sputum.  He was seen by e-visit on 02/16/2024 and was started on doxycycline  and Tessalon  Perles.  He has been taking the medication and he is feeling about 50% better. He denies fever, chest pain, headaches, ear pain, vomiting, or nausea. He reports significant sinus pressure, chest congestion, wheezing, post-nasal drip, and occasional shortness of breath.  Patient also would like to get tested for allergies and is requesting referral to allergist.   HPI   Past Medical History:  Diagnosis Date   Anxiety    Arthritis    shoulder   Cancer (HCC)    prostate ca - Md just watching   Cervical pain (neck)    limited mobility   Chronic pain    Decreased ROM of neck    Depression    years ago (minor)   Diverticulosis    Duodenal diverticulum    Elevated PSA    Hemorrhoids    Hiatal hernia    History of chicken pox    HLD (hyperlipidemia)    Hx of cold sores    Internal hemorrhoids    Mild cognitive impairment with memory loss    Neuropathy     Past Surgical History:  Procedure Laterality Date   ABDOMINAL EXPOSURE N/A 04/23/2021   Procedure: ABDOMINAL EXPOSURE;  Surgeon: Gretta Lonni PARAS, MD;  Location: [facility];  Service: Vascular;  Laterality: N/A;   ANTERIOR FUSION CERVICAL SPINE     4-5 levels; has had 3-4 surgeries for this    ANTERIOR LUMBAR FUSION N/A 04/23/2021   Procedure: Lumbar four-five,  Lumbar five-Sacral one Anterior Lumbar Interbody Fusion;  Surgeon: Dawley, Lani BROCKS, DO;  Location: [facility] OR;  Service: Neurosurgery;  Laterality: N/A;   APPENDECTOMY     BACK SURGERY     BACK SURGERY  04/2022   BONE EXCISION Left 09/28/2017   Procedure: BONE EXCISION-TAILORS  EXOSTECTOMY;  Surgeon: Ashley Soulier, DPM;  Location: [facility];  Service: Podiatry;  Laterality: Left;  IVA LOCAL   CERVICAL DISCECTOMY  2008   C2-C7 Fused    CHOLECYSTECTOMY     COLONOSCOPY  2020   COLONOSCOPY  2019   Octaviano Rend [facility] Due 03/2022 per patient   ELBOW FRACTURE SURGERY Left 1990   ESOPHAGOGASTRODUODENOSCOPY (EGD) WITH PROPOFOL  N/A 10/06/2021   Procedure: ESOPHAGOGASTRODUODENOSCOPY (EGD) WITH PROPOFOL ;  Surgeon: Maryruth Ole DASEN, MD;  Location: [facility];  Service: Endoscopy;  Laterality: N/A;   LUMBAR PERCUTANEOUS PEDICLE SCREW 2 LEVEL N/A 04/23/2021   Procedure: LUMBAR PERCUTANEOUS PEDICLE SCREW LUMBAR FOUR-SACRAL ONE;  Surgeon: Carollee Lani BROCKS, DO;  Location: [facility];  Service: Neurosurgery;  Laterality: N/A;   METATARSAL HEAD EXCISION Left 07/04/2020   Procedure: METATARSAL HEAD EXCISION;  Surgeon: Ashley Soulier, DPM;  Location: [facility];  Service: Podiatry;  Laterality: Left;   PROSTATE BIOPSY  N/A 03/15/2019   Procedure: PROSTATE BIOPSY Uro Nav;  Surgeon: Kassie Ozell SAUNDERS, MD;  Location: [facility];  Service: Urology;  Laterality: N/A;   RADIOLOGY WITH ANESTHESIA N/A 04/22/2020   Procedure: MRI WITH ANESTHESIA BRAIN WITHOUT CONTRAST;  Surgeon: Radiologist, Medication, MD;  Location: [facility] OR;  Service: Radiology;  Laterality: N/A;   REPLACEMENT TOTAL KNEE Right    right knee   ROTATOR CUFF REPAIR Right 2018   with arthroscopy and distal claviculectomy   TONSILLECTOMY AND ADENOIDECTOMY     TOTAL KNEE ARTHROPLASTY Right 2005   x 4    Family History  Problem Relation Age of Onset    Arthritis Mother    Breast cancer Mother    Mental illness Mother    Colon cancer Mother    Bipolar disorder Mother    Depression Mother    Colon polyps Mother    Lung cancer Father    Prostate cancer Father    Alcohol abuse Brother    Kidney disease Brother    Esophageal cancer Neg Hx     Social History   Socioeconomic History   Marital status: Married    Spouse name: Not on file   Number of children: Not on file   Years of education: Not on file   Highest education level: Bachelor's degree (e.g., BA, AB, BS)  Occupational History   Not on file  Tobacco Use   Smoking status: Former   Smokeless tobacco: Former    Types: Chew   Tobacco comments:    Quit 50 yrs ago for smoking and quit chewing 2 months ago  Vaping Use   Vaping status: Never Used  Substance and Sexual Activity   Alcohol use: Yes    Alcohol/week: 7.0 standard drinks of alcohol    Types: 7 Standard drinks or equivalent per week    Comment: beer/wine   Drug use: Never   Sexual activity: Not Currently  Other Topics Concern   Not on file  Social History Narrative   Married   Social Drivers of Health   Financial Resource Strain: Low Risk  (02/23/2024)   Overall Financial Resource Strain (CARDIA)    Difficulty of Paying Living Expenses: Not hard at all  Food Insecurity: No Food Insecurity (02/23/2024)   Hunger Vital Sign    Worried About Running Out of Food in the Last Year: Never true    Ran Out of Food in the Last Year: Never true  Transportation Needs: Unknown (02/23/2024)   PRAPARE - Administrator, Civil Service (Medical): Not on file    Lack of Transportation (Non-Medical): No  Physical Activity: Sufficiently Active (02/23/2024)   Exercise Vital Sign    Days of Exercise per Week: 4 days    Minutes of Exercise per Session: 60 min  Stress: No Stress Concern Present (02/23/2024)   [employer] of Occupational Health - Occupational Stress Questionnaire    Feeling of Stress: Only a  little  Social Connections: Socially Integrated (02/23/2024)   Social Connection and Isolation Panel    Frequency of Communication with Friends and Family: More than three times a week    Frequency of Social Gatherings with Friends and Family: More than three times a week    Attends Religious Services: More than 4 times per year    Active Member of [employer] or Organizations: Yes    Attends Engineer, structural: More than 4 times per year    Marital Status: Married  Catering manager Violence:  Not At Risk (01/31/2024)   Humiliation, Afraid, Rape, and Kick questionnaire    Fear of Current or Ex-Partner: No    Emotionally Abused: No    Physically Abused: No    Sexually Abused: No     Outpatient Medications Prior to Visit  Medication Sig Dispense Refill   acetaminophen  (TYLENOL ) 325 MG tablet Take 325 mg by mouth 4 (four) times daily.     benzonatate  (TESSALON  PERLES) 100 MG capsule Take 1 capsule (100 mg total) by mouth 3 (three) times daily as needed. 20 capsule 0   cholecalciferol (VITAMIN D3) 25 MCG (1000 UT) tablet Take 1,000 Units by mouth 3 (three) times a week.      cyanocobalamin  1000 MCG tablet Take 1,000 mcg by mouth daily.      donepezil  (ARICEPT ) 5 MG tablet Take 5 mg by mouth daily.     doxycycline  (VIBRA -TABS) 100 MG tablet Take 1 tablet (100 mg total) by mouth 2 (two) times daily. 20 tablet 0   levocetirizine (XYZAL ) 5 MG tablet Take 1 tablet (5 mg total) by mouth every evening. 90 tablet 0   Melatonin 10 MG TABS Take by mouth.     meloxicam  (MOBIC ) 7.5 MG tablet Take 7.5 mg by mouth daily. (Patient taking differently: Take 7.5 mg by mouth daily as needed.)     methocarbamol  (ROBAXIN ) 500 MG tablet Take 500 mg by mouth 3 (three) times daily. (Patient taking differently: Take 500 mg by mouth 3 (three) times daily as needed.)     Multiple Vitamin (MULTIVITAMIN) tablet Take 1 tablet by mouth daily. ABC Complete MEN     Oxycodone  HCl 10 MG TABS 10 mg 4 (four) times daily      pantoprazole  (PROTONIX ) 20 MG tablet Take 1 tablet (20 mg total) by mouth daily. 90 tablet 3   QUEtiapine  (SEROQUEL ) 300 MG tablet Take 0.5-1 tablets (150-300 mg total) by mouth at bedtime. 90 tablet 1   rosuvastatin  (CRESTOR ) 10 MG tablet TAKE 1 TABLET BY MOUTH DAILY 90 tablet 3   tadalafil (CIALIS) 5 MG tablet Take 5 mg by mouth daily.     tamsulosin  (FLOMAX ) 0.4 MG CAPS capsule Take 0.4 mg by mouth.     valACYclovir  (VALTREX ) 1000 MG tablet TAKE ONE (1) TABLET BY MOUTH TWO TIMES PER DAY. TAKE FOR ONE DAY AT ONSET OF BREAKOUT. 60 tablet 0   No facility-administered medications prior to visit.    Allergies  Allergen Reactions   Morphine Itching and Rash   Amoxicillin     REACTION: abdominal distention and churning pain in abdomen. Did it involve swelling of the face/tongue/throat, SOB, or low BP? No Did it involve sudden or severe rash/hives, skin peeling, or any reaction on the inside of your mouth or nose? No Did you need to seek medical attention at a hospital or doctor's office? No When did it last happen?      40+ years ago If all above answers are "NO", may proceed with cephalosporin use.    Fentanyl  Other (See Comments)    Severe headache    ROS Review of Systems  HENT:  Positive for congestion and sinus pain. Negative for ear pain.   Respiratory:  Positive for cough and wheezing.   Gastrointestinal:  Negative for nausea and vomiting.  Neurological:  Negative for headaches.   Negative unless indicated in HPI.    Objective:    Physical Exam HENT:     Right Ear: Tympanic membrane normal. Tympanic membrane is not erythematous.  Left Ear: Tympanic membrane normal. Tympanic membrane is not erythematous.     Nose:     Right Turbinates: Not enlarged.     Left Turbinates: Not enlarged.     Right Sinus: No maxillary sinus tenderness or frontal sinus tenderness.     Left Sinus: No maxillary sinus tenderness or frontal sinus tenderness.     Mouth/Throat:     Mouth:  Mucous membranes are moist.     Pharynx: Postnasal drip present. No pharyngeal swelling, oropharyngeal exudate or posterior oropharyngeal erythema.     Tonsils: No tonsillar exudate.  Cardiovascular:     Rate and Rhythm: Normal rate and regular rhythm.  Pulmonary:     Effort: Pulmonary effort is normal.     Breath sounds: Normal breath sounds. No stridor. No wheezing.  Neurological:     General: No focal deficit present.     Mental Status: He is alert and oriented to person, place, and time. Mental status is at baseline.  Psychiatric:        Mood and Affect: Mood normal.        Behavior: Behavior normal.        Thought Content: Thought content normal.        Judgment: Judgment normal.     BP 128/72   Pulse (!) 102   Temp 99 F (37.2 C)   Ht 5' 10 (1.778 m)   Wt 159 lb 9.6 oz (72.4 kg)   SpO2 98%   BMI 22.90 kg/m  Wt Readings from Last 3 Encounters:  02/24/24 159 lb 9.6 oz (72.4 kg)  01/31/24 158 lb (71.7 kg)  12/14/23 162 lb 12.8 oz (73.8 kg)     Health Maintenance  Topic Date Due   Mammogram  Never done   COVID-19 Vaccine (8 - Pfizer risk 2024-25 season) 03/10/2024 (Originally 01/23/2024)   Influenza Vaccine  08/21/2024 (Originally 12/23/2023)   Medicare Annual Wellness (AWV)  01/30/2025   DTaP/Tdap/Td (3 - Td or Tdap) 10/08/2030   Pneumococcal Vaccine: 50+ Years  Completed   Hepatitis C Screening  Completed   Zoster Vaccines- Shingrix  Completed   Meningococcal B Vaccine  Aged Out   Colonoscopy  Discontinued    There are no preventive care reminders to display for this patient.  Lab Results  Component Value Date   TSH 2.04 08/03/2023   Lab Results  Component Value Date   WBC 6.6 02/24/2024   HGB 12.7 (L) 02/24/2024   HCT 37.9 (L) 02/24/2024   MCV 92.4 02/24/2024   PLT 246.0 02/24/2024   Lab Results  Component Value Date   NA 140 12/12/2023   K 4.4 12/12/2023   CO2 30 12/12/2023   GLUCOSE 94 12/12/2023   BUN 12 12/12/2023   CREATININE 0.71  12/12/2023   BILITOT 0.5 12/12/2023   ALKPHOS 84 12/12/2023   AST 19 12/12/2023   ALT 12 12/12/2023   PROT 6.5 12/12/2023   ALBUMIN  4.2 12/12/2023   CALCIUM  9.2 12/12/2023   ANIONGAP 4 (L) 04/24/2021   GFR 91.00 12/12/2023   Lab Results  Component Value Date   CHOL 163 12/12/2023   Lab Results  Component Value Date   HDL 79.70 12/12/2023   Lab Results  Component Value Date   LDLCALC 72 12/12/2023   Lab Results  Component Value Date   TRIG 56.0 12/12/2023   Lab Results  Component Value Date   CHOLHDL 2 12/12/2023   Lab Results  Component Value Date   HGBA1C 5.6 12/12/2023  Assessment & Plan:  URI with cough and congestion Assessment & Plan: Symptoms improved by 50% on doxycycline . No fever, chest pain, or headaches. Lungs clear, oxygen saturation 98%.  Patient reported intermittent shortness of breath and wheezing.  Given symptoms will order stat chest x-ray to rule out pneumonia. -Chest x-ray negative for any infection or mass. -Advised patient to complete the antibiotic, Tessalon  Perles. -Will add prednisone  40 mg once a day for 5 days. -Continue Mucinex to thin secretions and facilitate expectoration. -Continue Flonase  for nasal congestion management. -Patient will let us  know if symptoms not improving.    Orders: -     DG Chest 2 View -     CBC  Seasonal allergies Assessment & Plan: Symptoms likely related to high ragweed levels, including sinus congestion and post-nasal drip. - Refer to allergist for further evaluation and management of seasonal allergies.  Orders: -     Ambulatory referral to ENT  Other orders -     predniSONE ; Take 2 tablets (40 mg total) by mouth daily with breakfast for 5 days.  Dispense: 10 tablet; Refill: 0    Follow-up: No follow-ups on file.   Eaton Folmar, NP

## 2024-02-24 NOTE — Telephone Encounter (Signed)
 Copied from CRM #[id number]. Topic: Clinical - Medication Question >> Feb 24, 2024  2:43 PM Burnard DEL wrote: Reason for CRM: Patient called in stating that at his visit today with provider Vincente splinter stated that prednisone  was [facility].Pharmacy doesn't have prescription that was supposed to be sent over.   [facility] - [place], [place] - [address]  Phone: [phone] Fax: [phone]

## 2024-02-27 DIAGNOSIS — J302 Other seasonal allergic rhinitis: Secondary | ICD-10-CM | POA: Insufficient documentation

## 2024-02-27 DIAGNOSIS — J069 Acute upper respiratory infection, unspecified: Secondary | ICD-10-CM | POA: Insufficient documentation

## 2024-02-27 NOTE — Progress Notes (Incomplete)
 Established Patient Office Visit  Subjective:  Patient ID: Mike Farley, male    DOB: [birth year]  Age: 73 y.o. MRN: [id number]  CC:  Chief Complaint  Patient presents with  . Acute Visit    Sinus chest congestion Started 2 weeks ago as sinus now moved into his chest Productive cough sometimes   Discussed the use of a AI scribe software for clinical note transcription with the patient, who gave verbal consent to proceed.  HPI  Mike Farley presents for acute visit for URI symptoms.   He has experienced respiratory symptoms for two weeks, starting with sinus congestion and progressing to yellowish nasal discharge and brownish-yellow sputum.  He was seen by e-visit on 02/16/2024 and was started on doxycycline    URI  Associated symptoms include congestion, coughing, sinus pain and wheezing. Pertinent negatives include no ear pain, headaches, nausea or vomiting.     Past Medical History:  Diagnosis Date  . Anxiety   . Arthritis    shoulder  . Cancer [facility])    prostate ca - Md just watching  . Cervical pain (neck)    limited mobility  . Chronic pain   . Decreased ROM of neck   . Depression    years ago (minor)  . Diverticulosis   . Duodenal diverticulum   . Elevated PSA   . Hemorrhoids   . Hiatal hernia   . History of chicken pox   . HLD (hyperlipidemia)   . Hx of cold sores   . Internal hemorrhoids   . Mild cognitive impairment with memory loss   . Neuropathy     Past Surgical History:  Procedure Laterality Date  . ABDOMINAL EXPOSURE N/A 04/23/2021   Procedure: ABDOMINAL EXPOSURE;  Surgeon: Gretta Lonni PARAS, MD;  Location: [facility] OR;  Service: Vascular;  Laterality: N/A;  . ANTERIOR FUSION CERVICAL SPINE     4-5 levels; has had 3-4 surgeries for this  . ANTERIOR LUMBAR FUSION N/A 04/23/2021   Procedure: Lumbar four-five,  Lumbar five-Sacral one Anterior Lumbar Interbody Fusion;  Surgeon: Dawley, Lani BROCKS, DO;  Location: [facility] OR;  Service: Neurosurgery;   Laterality: N/A;  . APPENDECTOMY    . BACK SURGERY    . BACK SURGERY  04/2022  . BONE EXCISION Left 09/28/2017   Procedure: BONE EXCISION-TAILORS  EXOSTECTOMY;  Surgeon: Ashley Soulier, DPM;  Location: [facility];  Service: Podiatry;  Laterality: Left;  IVA LOCAL  . CERVICAL DISCECTOMY  2008   C2-C7 Fused   . CHOLECYSTECTOMY    . COLONOSCOPY  2020  . COLONOSCOPY  2019   Octaviano Rend [facility] Due 03/2022 per patient  . ELBOW FRACTURE SURGERY Left 1990  . ESOPHAGOGASTRODUODENOSCOPY (EGD) WITH PROPOFOL  N/A 10/06/2021   Procedure: ESOPHAGOGASTRODUODENOSCOPY (EGD) WITH PROPOFOL ;  Surgeon: Maryruth Ole DASEN, MD;  Location: [facility] END[facility]COPY;  Service: Endoscopy;  Laterality: N/A;  . LUMBAR PERCUTANEOUS PEDICLE SCREW 2 LEVEL N/A 04/23/2021   Procedure: LUMBAR PERCUTANEOUS PEDICLE SCREW LUMBAR FOUR-SACRAL ONE;  Surgeon: Carollee Lani BROCKS, DO;  Location: [facility] OR;  Service: Neurosurgery;  Laterality: N/A;  . METATARSAL HEAD EXCISION Left 07/04/2020   Procedure: METATARSAL HEAD EXCISION;  Surgeon: Ashley Soulier, DPM;  Location: [facility] ORS;  Service: Podiatry;  Laterality: Left;  . PROSTATE BIOPSY N/A 03/15/2019   Procedure: PROSTATE BIOPSY Uro Nav;  Surgeon: Kassie Ozell SAUNDERS, MD;  Location: [facility] ORS;  Service: Urology;  Laterality: N/A;  . RADIOLOGY WITH ANESTHESIA N/A 04/22/2020   Procedure: MRI WITH  ANESTHESIA BRAIN WITHOUT CONTRAST;  Surgeon: Radiologist, Medication, MD;  Location: [facility] OR;  Service: Radiology;  Laterality: N/A;  . REPLACEMENT TOTAL KNEE Right    right knee  . ROTATOR CUFF REPAIR Right 2018   with arthroscopy and distal claviculectomy  . TONSILLECTOMY AND ADENOIDECTOMY    . TOTAL KNEE ARTHROPLASTY Right 2005   x 4    Family History  Problem Relation Age of Onset  . Arthritis Mother   . Breast cancer Mother   . Mental illness Mother   . Colon cancer Mother   . Bipolar disorder Mother   . Depression Mother   . Colon polyps Mother   . Lung cancer  Father   . Prostate cancer Father   . Alcohol abuse Brother   . Kidney disease Brother   . Esophageal cancer Neg Hx     Social History   Socioeconomic History  . Marital status: Married    Spouse name: Not on file  . Number of children: Not on file  . Years of education: Not on file  . Highest education level: Bachelor's degree (e.g., BA, AB, BS)  Occupational History  . Not on file  Tobacco Use  . Smoking status: Former  . Smokeless tobacco: Former    Types: Chew  . Tobacco comments:    Quit 50 yrs ago for smoking and quit chewing 2 months ago  Vaping Use  . Vaping status: Never Used  Substance and Sexual Activity  . Alcohol use: Yes    Alcohol/week: 7.0 standard drinks of alcohol    Types: 7 Standard drinks or equivalent per week    Comment: beer/wine  . Drug use: Never  . Sexual activity: Not Currently  Other Topics Concern  . Not on file  Social History Narrative   Married   Social Drivers of Health   Financial Resource Strain: Low Risk  (02/23/2024)   Overall Financial Resource Strain (CARDIA)   . Difficulty of Paying Living Expenses: Not hard at all  Food Insecurity: No Food Insecurity (02/23/2024)   Hunger Vital Sign   . Worried About Programme researcher, broadcasting/film/video in the Last Year: Never true   . Ran Out of Food in the Last Year: Never true  Transportation Needs: Unknown (02/23/2024)   PRAPARE - Transportation   . Lack of Transportation (Medical): Not on file   . Lack of Transportation (Non-Medical): No  Physical Activity: Sufficiently Active (02/23/2024)   Exercise Vital Sign   . Days of Exercise per Week: 4 days   . Minutes of Exercise per Session: 60 min  Stress: No Stress Concern Present (02/23/2024)   [employer] of Occupational Health - Occupational Stress Questionnaire   . Feeling of Stress: Only a little  Social Connections: Socially Integrated (02/23/2024)   Social Connection and Isolation Panel   . Frequency of Communication with Friends and Family:  More than three times a week   . Frequency of Social Gatherings with Friends and Family: More than three times a week   . Attends Religious Services: More than 4 times per year   . Active Member of Clubs or Organizations: Yes   . Attends Banker Meetings: More than 4 times per year   . Marital Status: Married  Catering manager Violence: Not At Risk (01/31/2024)   Humiliation, Afraid, Rape, and Kick questionnaire   . Fear of Current or Ex-Partner: No   . Emotionally Abused: No   . Physically Abused: No   . Sexually  Abused: No     Outpatient Medications Prior to Visit  Medication Sig Dispense Refill  . acetaminophen  (TYLENOL ) 325 MG tablet Take 325 mg by mouth 4 (four) times daily.    . benzonatate  (TESSALON  PERLES) 100 MG capsule Take 1 capsule (100 mg total) by mouth 3 (three) times daily as needed. 20 capsule 0  . cholecalciferol (VITAMIN D3) 25 MCG (1000 UT) tablet Take 1,000 Units by mouth 3 (three) times a week.     . cyanocobalamin  1000 MCG tablet Take 1,000 mcg by mouth daily.     . donepezil  (ARICEPT ) 5 MG tablet Take 5 mg by mouth daily.    . doxycycline  (VIBRA -TABS) 100 MG tablet Take 1 tablet (100 mg total) by mouth 2 (two) times daily. 20 tablet 0  . levocetirizine (XYZAL ) 5 MG tablet Take 1 tablet (5 mg total) by mouth every evening. 90 tablet 0  . Melatonin 10 MG TABS Take by mouth.    . meloxicam  (MOBIC ) 7.5 MG tablet Take 7.5 mg by mouth daily. (Patient taking differently: Take 7.5 mg by mouth daily as needed.)    . methocarbamol  (ROBAXIN ) 500 MG tablet Take 500 mg by mouth 3 (three) times daily. (Patient taking differently: Take 500 mg by mouth 3 (three) times daily as needed.)    . Multiple Vitamin (MULTIVITAMIN) tablet Take 1 tablet by mouth daily. ABC Complete MEN    . Oxycodone  HCl 10 MG TABS 10 mg 4 (four) times daily    . pantoprazole  (PROTONIX ) 20 MG tablet Take 1 tablet (20 mg total) by mouth daily. 90 tablet 3  . QUEtiapine  (SEROQUEL ) 300 MG tablet  Take 0.5-1 tablets (150-300 mg total) by mouth at bedtime. 90 tablet 1  . rosuvastatin  (CRESTOR ) 10 MG tablet TAKE 1 TABLET BY MOUTH DAILY 90 tablet 3  . tadalafil (CIALIS) 5 MG tablet Take 5 mg by mouth daily.    . tamsulosin  (FLOMAX ) 0.4 MG CAPS capsule Take 0.4 mg by mouth.    . valACYclovir  (VALTREX ) 1000 MG tablet TAKE ONE (1) TABLET BY MOUTH TWO TIMES PER DAY. TAKE FOR ONE DAY AT ONSET OF BREAKOUT. 60 tablet 0   No facility-administered medications prior to visit.    Allergies  Allergen Reactions  . Morphine Itching and Rash  . Amoxicillin     REACTION: abdominal distention and churning pain in abdomen. Did it involve swelling of the face/tongue/throat, SOB, or low BP? No Did it involve sudden or severe rash/hives, skin peeling, or any reaction on the inside of your mouth or nose? No Did you need to seek medical attention at a hospital or doctor's office? No When did it last happen?      40+ years ago If all above answers are "NO", may proceed with cephalosporin use.   . Fentanyl  Other (See Comments)    Severe headache    ROS Review of Systems  HENT:  Positive for congestion and sinus pain. Negative for ear pain.   Respiratory:  Positive for cough and wheezing.   Gastrointestinal:  Negative for nausea and vomiting.  Neurological:  Negative for headaches.   Negative unless indicated in HPI.    Objective:    Physical Exam  BP 128/72   Pulse (!) 102   Temp 99 F (37.2 C)   Ht 5' 10 (1.778 m)   Wt 159 lb 9.6 oz (72.4 kg)   SpO2 98%   BMI 22.90 kg/m  Wt Readings from Last 3 Encounters:  02/24/24 159 lb 9.6  oz (72.4 kg)  01/31/24 158 lb (71.7 kg)  12/14/23 162 lb 12.8 oz (73.8 kg)     Health Maintenance  Topic Date Due  . COVID-19 Vaccine (8 - Pfizer risk 2024-25 season) 03/10/2024 (Originally 01/23/2024)  . Influenza Vaccine  08/21/2024 (Originally 12/23/2023)  . Medicare Annual Wellness (AWV)  01/30/2025  . DTaP/Tdap/Td (3 - Td or Tdap) 10/08/2030  .  Pneumococcal Vaccine: 50+ Years  Completed  . Hepatitis C Screening  Completed  . Zoster Vaccines- Shingrix  Completed  . HPV VACCINES  Aged Out  . Meningococcal B Vaccine  Aged Out  . Colonoscopy  Discontinued    There are no preventive care reminders to display for this patient.  Lab Results  Component Value Date   TSH 2.04 08/03/2023   Lab Results  Component Value Date   WBC 4.6 12/12/2023   HGB 12.1 (L) 12/12/2023   HCT 35.8 (L) 12/12/2023   MCV 93.0 12/12/2023   PLT 240.0 12/12/2023   Lab Results  Component Value Date   NA 140 12/12/2023   K 4.4 12/12/2023   CO2 30 12/12/2023   GLUCOSE 94 12/12/2023   BUN 12 12/12/2023   CREATININE 0.71 12/12/2023   BILITOT 0.5 12/12/2023   ALKPHOS 84 12/12/2023   AST 19 12/12/2023   ALT 12 12/12/2023   PROT 6.5 12/12/2023   ALBUMIN  4.2 12/12/2023   CALCIUM  9.2 12/12/2023   ANIONGAP 4 (L) 04/24/2021   GFR 91.00 12/12/2023   Lab Results  Component Value Date   CHOL 163 12/12/2023   Lab Results  Component Value Date   HDL 79.70 12/12/2023   Lab Results  Component Value Date   LDLCALC 72 12/12/2023   Lab Results  Component Value Date   TRIG 56.0 12/12/2023   Lab Results  Component Value Date   CHOLHDL 2 12/12/2023   Lab Results  Component Value Date   HGBA1C 5.6 12/12/2023      Assessment & Plan:  There are no diagnoses linked to this encounter.  Follow-up: No follow-ups on file.   Winta Barcelo, NP

## 2024-02-27 NOTE — Assessment & Plan Note (Signed)
 Symptoms likely related to high ragweed levels, including sinus congestion and post-nasal drip. - Refer to allergist for further evaluation and management of seasonal allergies.

## 2024-02-27 NOTE — Assessment & Plan Note (Signed)
 Symptoms improved by 50% on doxycycline . No fever, chest pain, or headaches. Lungs clear, oxygen saturation 98%.  Patient reported intermittent shortness of breath and wheezing.  Given symptoms will order stat chest x-ray to rule out pneumonia. -Chest x-ray negative for any infection or mass. -Advised patient to complete the antibiotic, Tessalon  Perles. -Will add prednisone  40 mg once a day for 5 days. -Continue Mucinex to thin secretions and facilitate expectoration. -Continue Flonase  for nasal congestion management. -Patient will let us  know if symptoms not improving.

## 2024-02-28 ENCOUNTER — Encounter: Payer: Self-pay | Admitting: Internal Medicine

## 2024-02-28 DIAGNOSIS — R197 Diarrhea, unspecified: Secondary | ICD-10-CM

## 2024-02-28 NOTE — Telephone Encounter (Signed)
 Patient says no other symptoms. He was taking pepto and now using imodium. Last stool was about 9 AM. No vomiting, he is able to eat and drink. Do you want to see him or can we order stool study for C-diff.?

## 2024-02-28 NOTE — Telephone Encounter (Signed)
 Order signed - order for c.diff.

## 2024-02-28 NOTE — Telephone Encounter (Signed)
 Need to confirm if any vomiting or other symptoms. Also, given mention of black stool, see if he has taken pepto bismol. This can turn the stool black.  If has not taken and having black stool, needs to be evaluated. Also given recent abx, will need to check stool for c.diff. continue to stay hydrated.

## 2024-02-28 NOTE — Telephone Encounter (Signed)
 Patient is going to switch to kaopectate. He is already doing probiotic and aware of instructions below. I have pended a C-diff order for you and advised patient that he can come pick stool kit up tomorrow and [facility] after [facility]. (Send this back to me as a reminder to prepare stool kit for him in the morning)

## 2024-02-28 NOTE — Telephone Encounter (Signed)
 I am ok to check stool for c.diff if he is doing ok.  Continue to stay hydrated. Bland foods. Advance as tolerated. Would hold on taking imodium. If needs something for loose stool, would use kaopectate. Also start a probiotic daily.

## 2024-02-29 ENCOUNTER — Other Ambulatory Visit
Admission: RE | Admit: 2024-02-29 | Discharge: 2024-02-29 | Disposition: A | Discharge status: Home / Self Care | Source: Ambulatory Visit | Attending: Internal Medicine | Admitting: Internal Medicine

## 2024-02-29 ENCOUNTER — Telehealth: Payer: Self-pay | Admitting: Internal Medicine

## 2024-02-29 DIAGNOSIS — R197 Diarrhea, unspecified: Secondary | ICD-10-CM | POA: Diagnosis not present

## 2024-02-29 LAB — C DIFFICILE QUICK SCREEN W PCR REFLEX
C Diff antigen: NEGATIVE
C Diff interpretation: NOT DETECTED
C Diff toxin: NEGATIVE

## 2024-02-29 NOTE — Telephone Encounter (Signed)
 Stool kit placed up front. Pt is aware.

## 2024-02-29 NOTE — Telephone Encounter (Signed)
 Error

## 2024-02-29 NOTE — Telephone Encounter (Signed)
 FYI Stool kit has been pick up.

## 2024-03-01 ENCOUNTER — Ambulatory Visit: Payer: Self-pay | Admitting: Internal Medicine

## 2024-03-05 ENCOUNTER — Encounter: Payer: Self-pay | Admitting: Internal Medicine

## 2024-03-05 NOTE — Telephone Encounter (Signed)
 Called patient to offer him an appointment. This has been going on for a month. Denies any acute symptoms. He [place] and will call once he returns if he feels an appointment is necessary.

## 2024-03-06 ENCOUNTER — Encounter: Payer: Self-pay | Admitting: Internal Medicine

## 2024-03-06 NOTE — Telephone Encounter (Signed)
 His last hgb had improved some from previous check and is near normal. I feel his fatigue is a combination of being sick and from not being active. It appears had discussion yesterday and offered him an appt. He had reported [place]. Please confirm symptoms and see if feels needs to be evaluated.

## 2024-03-08 NOTE — Telephone Encounter (Signed)
 If he is feeling better, hold on iron for now. Keep us  posted and let us  know if needs anything.

## 2024-03-10 NOTE — Telephone Encounter (Signed)
 Reviewed. He has an upcoming appt with me. He is currently not in town. Has already been advised to go go urgent care or wic for evaluation.  Also, please call and notify - if has not been evaluated acutely, when he returns home, [facility] has a walk in clinic for orthopedic emergencies and can walk in there without an appt.

## 2024-03-12 NOTE — Telephone Encounter (Signed)
 Called pt. He is aware. He states he is doing so much better over the weekend so he don't need to to to the [facility] at this time.

## 2024-03-18 ENCOUNTER — Encounter: Payer: Self-pay | Admitting: Internal Medicine

## 2024-03-19 ENCOUNTER — Ambulatory Visit: Admitting: Internal Medicine

## 2024-03-19 ENCOUNTER — Encounter: Payer: Self-pay | Admitting: Internal Medicine

## 2024-03-19 VITALS — BP 116/60 | HR 95 | Temp 98.9°F | Ht 70.0 in | Wt 161.0 lb

## 2024-03-19 DIAGNOSIS — Z0000 Encounter for general adult medical examination without abnormal findings: Secondary | ICD-10-CM

## 2024-03-19 DIAGNOSIS — C61 Malignant neoplasm of prostate: Secondary | ICD-10-CM

## 2024-03-19 DIAGNOSIS — D649 Anemia, unspecified: Secondary | ICD-10-CM | POA: Diagnosis not present

## 2024-03-19 DIAGNOSIS — F32A Depression, unspecified: Secondary | ICD-10-CM

## 2024-03-19 DIAGNOSIS — R739 Hyperglycemia, unspecified: Secondary | ICD-10-CM

## 2024-03-19 DIAGNOSIS — E7800 Pure hypercholesterolemia, unspecified: Secondary | ICD-10-CM

## 2024-03-19 DIAGNOSIS — Z23 Encounter for immunization: Secondary | ICD-10-CM | POA: Diagnosis not present

## 2024-03-19 DIAGNOSIS — J069 Acute upper respiratory infection, unspecified: Secondary | ICD-10-CM

## 2024-03-19 LAB — HEPATIC FUNCTION PANEL
ALT: 16 U/L (ref 0–53)
AST: 23 U/L (ref 0–37)
Albumin: 4.5 g/dL (ref 3.5–5.2)
Alkaline Phosphatase: 111 U/L (ref 39–117)
Bilirubin, Direct: 0.1 mg/dL (ref 0.0–0.3)
Total Bilirubin: 0.5 mg/dL (ref 0.2–1.2)
Total Protein: 6.8 g/dL (ref 6.0–8.3)

## 2024-03-19 LAB — CBC WITH DIFFERENTIAL/PLATELET
Basophils Absolute: 0 K/uL (ref 0.0–0.1)
Basophils Relative: 0.5 % (ref 0.0–3.0)
Eosinophils Absolute: 0.3 K/uL (ref 0.0–0.7)
Eosinophils Relative: 4.1 % (ref 0.0–5.0)
HCT: 37.2 % — ABNORMAL LOW (ref 39.0–52.0)
Hemoglobin: 12.4 g/dL — ABNORMAL LOW (ref 13.0–17.0)
Lymphocytes Relative: 23.7 % (ref 12.0–46.0)
Lymphs Abs: 1.5 K/uL (ref 0.7–4.0)
MCHC: 33.3 g/dL (ref 30.0–36.0)
MCV: 92.2 fl (ref 78.0–100.0)
Monocytes Absolute: 0.5 K/uL (ref 0.1–1.0)
Monocytes Relative: 7.4 % (ref 3.0–12.0)
Neutro Abs: 4 K/uL (ref 1.4–7.7)
Neutrophils Relative %: 64.3 % (ref 43.0–77.0)
Platelets: 260 K/uL (ref 150.0–400.0)
RBC: 4.04 Mil/uL — ABNORMAL LOW (ref 4.22–5.81)
RDW: 13.4 % (ref 11.5–15.5)
WBC: 6.2 K/uL (ref 4.0–10.5)

## 2024-03-19 LAB — HEMOGLOBIN A1C: Hgb A1c MFr Bld: 6 % (ref 4.6–6.5)

## 2024-03-19 LAB — IBC + FERRITIN
Ferritin: 68.1 ng/mL (ref 22.0–322.0)
Iron: 68 ug/dL (ref 42–165)
Saturation Ratios: 21.9 % (ref 20.0–50.0)
TIBC: 310.8 ug/dL (ref 250.0–450.0)
Transferrin: 222 mg/dL (ref 212.0–360.0)

## 2024-03-19 LAB — BASIC METABOLIC PANEL WITH GFR
BUN: 15 mg/dL (ref 6–23)
CO2: 30 meq/L (ref 19–32)
Calcium: 9 mg/dL (ref 8.4–10.5)
Chloride: 99 meq/L (ref 96–112)
Creatinine, Ser: 0.94 mg/dL (ref 0.40–1.50)
GFR: 80.26 mL/min (ref >60.00)
Glucose, Bld: 100 mg/dL — ABNORMAL HIGH (ref 70–99)
Potassium: 4.4 meq/L (ref 3.5–5.1)
Sodium: 137 meq/L (ref 135–145)

## 2024-03-19 LAB — LIPID PANEL
Cholesterol: 173 mg/dL (ref 0–200)
HDL: 89.2 mg/dL (ref >39.00)
LDL Cholesterol: 67 mg/dL (ref 0–99)
NonHDL: 83.83
Total CHOL/HDL Ratio: 2
Triglycerides: 86 mg/dL (ref 0.0–149.0)
VLDL: 17.2 mg/dL (ref 0.0–40.0)

## 2024-03-19 LAB — VITAMIN B12: Vitamin B-12: 1129 pg/mL — ABNORMAL HIGH (ref 211–911)

## 2024-03-19 NOTE — Telephone Encounter (Signed)
Pt scheduled at 11am

## 2024-03-19 NOTE — Progress Notes (Signed)
 Subjective:    Patient ID: Mike Farley, male    DOB: [birth year], 73 y.o.   MRN: [id number]  Patient here for  Chief Complaint  Patient presents with   Medical Management of Chronic Issues    HPI Here for a work in appt - was seen 02/24/24 - acute appt - treated for URI. Had previously been seen 02/16/24 - was treated with doxycycline  and tessalon  perles. At the 02/24/24 appt - CXR negative. Prednisone  added. Continue mucinex and flonase . Per review, was referred to ENT for further evaluation and allergy testing. Has appt with Dr Herminio 03/26/24. Of note, had increased GI issues with doxycycline . Better now. He has been taking aricept . Has f/u with Dr Maree 03/27/24. Stable. Recent right elbow swelling. Feels related to pressure washing/overuse. Resolved with exercises. He is walking 4-5 miles per day. No chest pain reported. Overall doing better.    Past Medical History:  Diagnosis Date   Anxiety    Arthritis    shoulder   Cancer (HCC)    prostate ca - Md just watching   Cervical pain (neck)    limited mobility   Chronic pain    Decreased ROM of neck    Depression    years ago (minor)   Diverticulosis    Duodenal diverticulum    Elevated PSA    Hemorrhoids    Hiatal hernia    History of chicken pox    HLD (hyperlipidemia)    Hx of cold sores    Internal hemorrhoids    Mild cognitive impairment with memory loss    Neuropathy    Past Surgical History:  Procedure Laterality Date   ABDOMINAL EXPOSURE N/A 04/23/2021   Procedure: ABDOMINAL EXPOSURE;  Surgeon: Gretta Lonni PARAS, MD;  Location: [facility];  Service: Vascular;  Laterality: N/A;   ANTERIOR FUSION CERVICAL SPINE     4-5 levels; has had 3-4 surgeries for this   ANTERIOR LUMBAR FUSION N/A 04/23/2021   Procedure: Lumbar four-five,  Lumbar five-Sacral one Anterior Lumbar Interbody Fusion;  Surgeon: Dawley, Lani BROCKS, DO;  Location: [facility] OR;  Service: Neurosurgery;  Laterality: N/A;   APPENDECTOMY     BACK SURGERY      BACK SURGERY  04/2022   BONE EXCISION Left 09/28/2017   Procedure: BONE EXCISION-TAILORS  EXOSTECTOMY;  Surgeon: Ashley Soulier, DPM;  Location: [facility];  Service: Podiatry;  Laterality: Left;  IVA LOCAL   CERVICAL DISCECTOMY  2008   C2-C7 Fused    CHOLECYSTECTOMY     COLONOSCOPY  2020   COLONOSCOPY  2019   Octav[facility] Due 03/2022 per patient   ELBOW FRACTURE SURGERY Left 1990   ESOPHAGOGASTRODUODENOSCOPY (EGD) WITH PROPOFOL  N/A 10/06/2021   Procedure: ESOPHAGOGASTRODUODENOSCOPY (EGD) WITH PROPOFOL ;  Surgeon: Maryruth Ole DASEN, MD;  Location: [facility];  Service: Endoscopy;  Laterality: N/A;   LUMBAR PERCUTANEOUS PEDICLE SCREW 2 LEVEL N/A 04/23/2021   Procedure: LUMBAR PERCUTANEOUS PEDICLE SCREW LUMBAR FOUR-SACRAL ONE;  Surgeon: Carollee Lani BROCKS, DO;  Location: [facility];  Service: Neurosurgery;  Laterality: N/A;   METATARSAL HEAD EXCISION Left 07/04/2020   Procedure: METATARSAL HEAD EXCISION;  Surgeon: Ashley Soulier, DPM;  Location: [facility];  Service: Podiatry;  Laterality: Left;   PROSTATE BIOPSY N/A 03/15/2019   Procedure: PROSTATE BIOPSY Uro Nav;  Surgeon: Kassie Ozell SAUNDERS, MD;  Location: [facility];  Service: Urology;  Laterality: N/A;   RADIOLOGY WITH ANESTHESIA N/A 04/22/2020   Procedure: MRI WITH ANESTHESIA BRAIN  WITHOUT CONTRAST;  Surgeon: Radiologist, Medication, MD;  Location: [facility];  Service: Radiology;  Laterality: N/A;   REPLACEMENT TOTAL KNEE Right    right knee   ROTATOR CUFF REPAIR Right 2018   with arthroscopy and distal claviculectomy   TONSILLECTOMY AND ADENOIDECTOMY     TOTAL KNEE ARTHROPLASTY Right 2005   x 4   Family History  Problem Relation Age of Onset   Arthritis Mother    Breast cancer Mother    Mental illness Mother    Colon cancer Mother    Bipolar disorder Mother    Depression Mother    Colon polyps Mother    Lung cancer Father    Prostate cancer Father    Alcohol abuse Brother    Kidney disease Brother     Esophageal cancer Neg Hx    Social History   Socioeconomic History   Marital status: Married    Spouse name: Not on file   Number of children: Not on file   Years of education: Not on file   Highest education level: Bachelor's degree (e.g., BA, AB, BS)  Occupational History   Not on file  Tobacco Use   Smoking status: Former   Smokeless tobacco: Former    Types: Chew   Tobacco comments:    Quit 50 yrs ago for smoking and quit chewing 2 months ago  Vaping Use   Vaping status: Never Used  Substance and Sexual Activity   Alcohol use: Yes    Alcohol/week: 7.0 standard drinks of alcohol    Types: 7 Standard drinks or equivalent per week    Comment: beer/wine   Drug use: Never   Sexual activity: Not Currently  Other Topics Concern   Not on file  Social History Narrative   Married   Social Drivers of Health   Financial Resource Strain: Low Risk  (03/18/2024)   Overall Financial Resource Strain (CARDIA)    Difficulty of Paying Living Expenses: Not hard at all  Food Insecurity: No Food Insecurity (03/18/2024)   Hunger Vital Sign    Worried About Running Out of Food in the Last Year: Never true    Ran Out of Food in the Last Year: Never true  Transportation Needs: No Transportation Needs (03/18/2024)   PRAPARE - Administrator, Civil Service (Medical): No    Lack of Transportation (Non-Medical): No  Physical Activity: Sufficiently Active (03/18/2024)   Exercise Vital Sign    Days of Exercise per Week: 5 days    Minutes of Exercise per Session: 40 min  Stress: No Stress Concern Present (02/23/2024)   [employer] of Occupational Health - Occupational Stress Questionnaire    Feeling of Stress: Only a little  Social Connections: Socially Integrated (03/18/2024)   Social Connection and Isolation Panel    Frequency of Communication with Friends and Family: More than three times a week    Frequency of Social Gatherings with Friends and Family: Three times a  week    Attends Religious Services: More than 4 times per year    Active Member of Clubs or Organizations: Yes    Attends Banker Meetings: More than 4 times per year    Marital Status: Married     Review of Systems  Constitutional:  Negative for fever and unexpected weight change.  HENT:  Negative for congestion and sinus pressure.   Respiratory:  Negative for cough, chest tightness and shortness of breath.   Cardiovascular:  Negative for chest pain,  palpitations and leg swelling.  Gastrointestinal:  Negative for abdominal pain, nausea and vomiting.       GI symptoms improved.   Genitourinary:  Negative for difficulty urinating and dysuria.  Musculoskeletal:  Negative for myalgias.       Previous elbow swelling - resolved.   Skin:  Negative for color change and rash.  Neurological:  Negative for dizziness and headaches.  Psychiatric/Behavioral:  Negative for agitation and dysphoric mood.        Objective:     BP 116/60   Pulse 95   Temp 98.9 F (37.2 C) (Oral)   Ht 5' 10 (1.778 m)   Wt 161 lb (73 kg)   SpO2 95%   BMI 23.10 kg/m  Wt Readings from Last 3 Encounters:  03/19/24 161 lb (73 kg)  02/24/24 159 lb 9.6 oz (72.4 kg)  01/31/24 158 lb (71.7 kg)    Physical Exam Vitals reviewed.  Constitutional:      General: He is not in acute distress.    Appearance: Normal appearance. He is well-developed.  HENT:     Head: Normocephalic and atraumatic.     Right Ear: External ear normal.     Left Ear: External ear normal.     Mouth/Throat:     Pharynx: No oropharyngeal exudate or posterior oropharyngeal erythema.  Eyes:     General: No scleral icterus.       Right eye: No discharge.        Left eye: No discharge.     Conjunctiva/sclera: Conjunctivae normal.  Cardiovascular:     Rate and Rhythm: Normal rate and regular rhythm.  Pulmonary:     Effort: Pulmonary effort is normal. No respiratory distress.     Breath sounds: Normal breath sounds.   Abdominal:     General: Bowel sounds are normal.     Palpations: Abdomen is soft.     Tenderness: There is no abdominal tenderness.  Musculoskeletal:        General: No swelling or tenderness.     Cervical back: Neck supple. No tenderness.  Lymphadenopathy:     Cervical: No cervical adenopathy.  Skin:    Findings: No erythema or rash.  Neurological:     Mental Status: He is alert.  Psychiatric:        Mood and Affect: Mood normal.        Behavior: Behavior normal.         Outpatient Encounter Medications as of 03/19/2024  Medication Sig   acetaminophen  (TYLENOL ) 325 MG tablet Take 325 mg by mouth 4 (four) times daily.   benzonatate  (TESSALON  PERLES) 100 MG capsule Take 1 capsule (100 mg total) by mouth 3 (three) times daily as needed.   cholecalciferol (VITAMIN D3) 25 MCG (1000 UT) tablet Take 1,000 Units by mouth 3 (three) times a week.    cyanocobalamin  1000 MCG tablet Take 1,000 mcg by mouth daily.    donepezil  (ARICEPT ) 5 MG tablet Take 5 mg by mouth daily.   doxycycline  (VIBRA -TABS) 100 MG tablet Take 1 tablet (100 mg total) by mouth 2 (two) times daily.   levocetirizine (XYZAL ) 5 MG tablet Take 1 tablet (5 mg total) by mouth every evening.   Melatonin 10 MG TABS Take by mouth.   meloxicam  (MOBIC ) 7.5 MG tablet Take 7.5 mg by mouth daily. (Patient taking differently: Take 7.5 mg by mouth daily as needed.)   methocarbamol  (ROBAXIN ) 500 MG tablet Take 500 mg by mouth 3 (three) times daily. (Patient  taking differently: Take 500 mg by mouth 3 (three) times daily as needed.)   Multiple Vitamin (MULTIVITAMIN) tablet Take 1 tablet by mouth daily. ABC Complete MEN   Oxycodone  HCl 10 MG TABS 10 mg 4 (four) times daily   pantoprazole  (PROTONIX ) 20 MG tablet Take 1 tablet (20 mg total) by mouth daily.   QUEtiapine  (SEROQUEL ) 300 MG tablet Take 0.5-1 tablets (150-300 mg total) by mouth at bedtime.   rosuvastatin  (CRESTOR ) 10 MG tablet TAKE 1 TABLET BY MOUTH DAILY   tadalafil  (CIALIS) 5 MG tablet Take 5 mg by mouth daily.   tamsulosin  (FLOMAX ) 0.4 MG CAPS capsule Take 0.4 mg by mouth.   valACYclovir  (VALTREX ) 1000 MG tablet TAKE ONE (1) TABLET BY MOUTH TWO TIMES PER DAY. TAKE FOR ONE DAY AT ONSET OF BREAKOUT.   No facility-administered encounter medications on file as of 03/19/2024.     Lab Results  Component Value Date   WBC 6.2 03/19/2024   HGB 12.4 (L) 03/19/2024   HCT 37.2 (L) 03/19/2024   PLT 260.0 03/19/2024   GLUCOSE 100 (H) 03/19/2024   CHOL 173 03/19/2024   TRIG 86.0 03/19/2024   HDL 89.20 03/19/2024   LDLCALC 67 03/19/2024   ALT 16 03/19/2024   AST 23 03/19/2024   NA 137 03/19/2024   K 4.4 03/19/2024   CL 99 03/19/2024   CREATININE 0.94 03/19/2024   BUN 15 03/19/2024   CO2 30 03/19/2024   TSH 2.04 08/03/2023   PSA 16.07 (H) 09/29/2022   INR 1.03 05/29/2009   HGBA1C 6.0 03/19/2024       Assessment & Plan:  Hypercholesteremia Assessment & Plan: Continue crestor .  Low cholesterol diet and exercise. Check lipid panel today.   Orders: -     Lipid panel -     Hepatic function panel -     Basic metabolic panel with GFR  Health care maintenance  Need for influenza vaccination -     Flu vaccine HIGH DOSE PF(Fluzone Trivalent)  Anemia, unspecified type Assessment & Plan: Recent hgb slightly decreased. Recheck cbc today. Also check iron studies and B12.   Orders: -     Vitamin B12 -     IBC + Ferritin -     CBC with Differential/Platelet  Hyperglycemia Assessment & Plan: Continue low-carb diet and exercise.  Check A1c today.   Orders: -     Hemoglobin A1c  URI with cough and congestion Assessment & Plan: Recently treated with doxycycline  and prednisone . GI side effect with doxycycline . Overall doing better. Symptoms improved. Has appt with Dr Herminio 03/27/24.    Prostate cancer [facility]) Assessment & Plan: Being followed by oncology. Following - active surveillance.    Mild depression Assessment & Plan: Continue  seroquel . Overall stable. Follow.       Allena Hamilton, MD

## 2024-03-20 ENCOUNTER — Ambulatory Visit: Payer: Self-pay | Admitting: Internal Medicine

## 2024-03-20 DIAGNOSIS — J309 Allergic rhinitis, unspecified: Secondary | ICD-10-CM | POA: Diagnosis not present

## 2024-03-20 DIAGNOSIS — J301 Allergic rhinitis due to pollen: Secondary | ICD-10-CM | POA: Diagnosis not present

## 2024-03-20 DIAGNOSIS — R0981 Nasal congestion: Secondary | ICD-10-CM | POA: Diagnosis not present

## 2024-03-25 ENCOUNTER — Encounter: Payer: Self-pay | Admitting: Internal Medicine

## 2024-03-25 DIAGNOSIS — D649 Anemia, unspecified: Secondary | ICD-10-CM

## 2024-03-25 NOTE — Assessment & Plan Note (Signed)
 Recently treated with doxycycline  and prednisone . GI side effect with doxycycline . Overall doing better. Symptoms improved. Has appt with Dr Herminio 03/27/24.

## 2024-03-25 NOTE — Assessment & Plan Note (Signed)
 Continue crestor . Low cholesterol diet and exercise. Check lipid panel today.

## 2024-03-25 NOTE — Assessment & Plan Note (Signed)
 Recent hgb slightly decreased. Recheck cbc today. Also check iron studies and B12.

## 2024-03-25 NOTE — Assessment & Plan Note (Signed)
 Continue low-carb diet and exercise.  Check A1c today.

## 2024-03-25 NOTE — Assessment & Plan Note (Signed)
 Continue seroquel . Overall stable. Follow.

## 2024-03-25 NOTE — Assessment & Plan Note (Signed)
 Being followed by oncology. Following - active surveillance.

## 2024-03-26 NOTE — Telephone Encounter (Signed)
 Can decrease b12 and iron to every other day. Will follow.  Recheck cbc and iron studies in 4-6 weeks.

## 2024-03-27 DIAGNOSIS — Z1331 Encounter for screening for depression: Secondary | ICD-10-CM | POA: Diagnosis not present

## 2024-03-27 DIAGNOSIS — G3184 Mild cognitive impairment, so stated: Secondary | ICD-10-CM | POA: Diagnosis not present

## 2024-03-28 DIAGNOSIS — M4721 Other spondylosis with radiculopathy, occipito-atlanto-axial region: Secondary | ICD-10-CM | POA: Diagnosis not present

## 2024-03-28 DIAGNOSIS — M47812 Spondylosis without myelopathy or radiculopathy, cervical region: Secondary | ICD-10-CM | POA: Diagnosis not present

## 2024-03-28 DIAGNOSIS — G894 Chronic pain syndrome: Secondary | ICD-10-CM | POA: Diagnosis not present

## 2024-03-28 DIAGNOSIS — M4802 Spinal stenosis, cervical region: Secondary | ICD-10-CM | POA: Diagnosis not present

## 2024-03-29 DIAGNOSIS — J301 Allergic rhinitis due to pollen: Secondary | ICD-10-CM | POA: Diagnosis not present

## 2024-04-02 DIAGNOSIS — J301 Allergic rhinitis due to pollen: Secondary | ICD-10-CM | POA: Diagnosis not present

## 2024-04-05 DIAGNOSIS — J301 Allergic rhinitis due to pollen: Secondary | ICD-10-CM | POA: Diagnosis not present

## 2024-04-09 DIAGNOSIS — J301 Allergic rhinitis due to pollen: Secondary | ICD-10-CM | POA: Diagnosis not present

## 2024-04-12 DIAGNOSIS — J301 Allergic rhinitis due to pollen: Secondary | ICD-10-CM | POA: Diagnosis not present

## 2024-04-23 ENCOUNTER — Other Ambulatory Visit (INDEPENDENT_AMBULATORY_CARE_PROVIDER_SITE_OTHER)

## 2024-04-23 ENCOUNTER — Other Ambulatory Visit

## 2024-04-23 DIAGNOSIS — D649 Anemia, unspecified: Secondary | ICD-10-CM | POA: Diagnosis not present

## 2024-04-23 LAB — IBC + FERRITIN
Ferritin: 46.1 ng/mL (ref 22.0–322.0)
Iron: 53 ug/dL (ref 42–165)
Saturation Ratios: 16.8 % — ABNORMAL LOW (ref 20.0–50.0)
TIBC: 315 ug/dL (ref 250.0–450.0)
Transferrin: 225 mg/dL (ref 212.0–360.0)

## 2024-04-23 LAB — CBC WITH DIFFERENTIAL/PLATELET
Basophils Absolute: 0 K/uL (ref 0.0–0.1)
Basophils Relative: 0.6 % (ref 0.0–3.0)
Eosinophils Absolute: 0.2 K/uL (ref 0.0–0.7)
Eosinophils Relative: 2.7 % (ref 0.0–5.0)
HCT: 36.8 % — ABNORMAL LOW (ref 39.0–52.0)
Hemoglobin: 12.5 g/dL — ABNORMAL LOW (ref 13.0–17.0)
Lymphocytes Relative: 25.7 % (ref 12.0–46.0)
Lymphs Abs: 1.9 K/uL (ref 0.7–4.0)
MCHC: 33.9 g/dL (ref 30.0–36.0)
MCV: 93.3 fl (ref 78.0–100.0)
Monocytes Absolute: 0.6 K/uL (ref 0.1–1.0)
Monocytes Relative: 8.3 % (ref 3.0–12.0)
Neutro Abs: 4.5 K/uL (ref 1.4–7.7)
Neutrophils Relative %: 62.7 % (ref 43.0–77.0)
Platelets: 225 K/uL (ref 150.0–400.0)
RBC: 3.95 Mil/uL — ABNORMAL LOW (ref 4.22–5.81)
RDW: 14.3 % (ref 11.5–15.5)
WBC: 7.2 K/uL (ref 4.0–10.5)

## 2024-04-24 ENCOUNTER — Ambulatory Visit: Payer: Self-pay | Admitting: Internal Medicine

## 2024-04-25 ENCOUNTER — Ambulatory Visit: Admitting: Internal Medicine

## 2024-04-25 ENCOUNTER — Encounter: Payer: Self-pay | Admitting: Internal Medicine

## 2024-04-25 VITALS — BP 122/76 | HR 79 | Temp 98.9°F | Ht 70.0 in | Wt 164.0 lb

## 2024-04-25 DIAGNOSIS — D649 Anemia, unspecified: Secondary | ICD-10-CM

## 2024-04-25 DIAGNOSIS — G3184 Mild cognitive impairment, so stated: Secondary | ICD-10-CM | POA: Diagnosis not present

## 2024-04-25 DIAGNOSIS — C61 Malignant neoplasm of prostate: Secondary | ICD-10-CM

## 2024-04-25 DIAGNOSIS — R739 Hyperglycemia, unspecified: Secondary | ICD-10-CM | POA: Diagnosis not present

## 2024-04-25 DIAGNOSIS — E7800 Pure hypercholesterolemia, unspecified: Secondary | ICD-10-CM

## 2024-04-25 DIAGNOSIS — F32A Depression, unspecified: Secondary | ICD-10-CM

## 2024-04-25 DIAGNOSIS — R110 Nausea: Secondary | ICD-10-CM

## 2024-04-25 DIAGNOSIS — J301 Allergic rhinitis due to pollen: Secondary | ICD-10-CM | POA: Diagnosis not present

## 2024-04-25 NOTE — Progress Notes (Unsigned)
 Subjective:    Patient ID: Mike Farley, male    DOB: 08-27-1950, 73 y.o.   MRN: [id number]  Patient here for  Chief Complaint  Patient presents with   Medical Management of Chronic Issues    HPI Here for a scheduled follow up - follow up regarding anemia, hypercholesterolemia and hyperglycemia. Had f/u with neurology 03/27/24 - f/u [facility]. Aricept  increased to 10mg  q day. Had f/u [facility] - prostate cancer. Tamsulosin  decreased to .4mg  q day. Recommended f/u psa and testosterone  in 6 months. Has had intermittent GI issues. Noticed approximately one month ago - nausea. Some loose stool/diarrhea. Decreased appetite. Eating bland foods. Previously on doxycycline  - intolerance. Feels this aggravated GI issues. Also, was using nicotine - feels this might have been aggravating. Has stopped. Appears to be doing better. Trying to stay active. Breathing stable. No increased cough or congestion.    Past Medical History:  Diagnosis Date   Anxiety    Arthritis    shoulder   Cancer (HCC)    prostate ca - Md just watching   Cervical pain (neck)    limited mobility   Chronic pain    Decreased ROM of neck    Depression    years ago (minor)   Diverticulosis    Duodenal diverticulum    Elevated PSA    Hemorrhoids    Hiatal hernia    History of chicken pox    HLD (hyperlipidemia)    Hx of cold sores    Internal hemorrhoids    Mild cognitive impairment with memory loss    Neuropathy    Past Surgical History:  Procedure Laterality Date   ABDOMINAL EXPOSURE N/A 04/23/2021   Procedure: ABDOMINAL EXPOSURE;  Surgeon: Gretta Lonni PARAS, MD;  Location: [facility];  Service: Vascular;  Laterality: N/A;   ANTERIOR FUSION CERVICAL SPINE     4-5 levels; has had 3-4 surgeries for this   ANTERIOR LUMBAR FUSION N/A 04/23/2021   Procedure: Lumbar four-five,  Lumbar five-Sacral one Anterior Lumbar Interbody Fusion;  Surgeon: Dawley, Lani BROCKS, DO;  Location: [facility];  Service: Neurosurgery;  Laterality: N/A;    APPENDECTOMY     BACK SURGERY     BACK SURGERY  04/2022   BONE EXCISION Left 09/28/2017   Procedure: BONE EXCISION-TAILORS  EXOSTECTOMY;  Surgeon: Ashley Soulier, DPM;  Location: [facility];  Service: Podiatry;  Laterality: Left;  IVA LOCAL   CERVICAL DISCECTOMY  2008   C2-C7 Fused    CHOLECYSTECTOMY     COLONOSCOPY  2020   COLONOSCOPY  2019   Octaviano Rend [facility] Due 03/2022 per patient   ELBOW FRACTURE SURGERY Left 1990   ESOPHAGOGASTRODUODENOSCOPY (EGD) WITH PROPOFOL  N/A 10/06/2021   Procedure: ESOPHAGOGASTRODUODENOSCOPY (EGD) WITH PROPOFOL ;  Surgeon: Maryruth Ole DASEN, MD;  Location: [facility];  Service: Endoscopy;  Laterality: N/A;   LUMBAR PERCUTANEOUS PEDICLE SCREW 2 LEVEL N/A 04/23/2021   Procedure: LUMBAR PERCUTANEOUS PEDICLE SCREW LUMBAR FOUR-SACRAL ONE;  Surgeon: Carollee Lani BROCKS, DO;  Location: [facility];  Service: Neurosurgery;  Laterality: N/A;   METATARSAL HEAD EXCISION Left 07/04/2020   Procedure: METATARSAL HEAD EXCISION;  Surgeon: Ashley Soulier, DPM;  Location: [facility];  Service: Podiatry;  Laterality: Left;   PROSTATE BIOPSY N/A 03/15/2019   Procedure: PROSTATE BIOPSY Uro Nav;  Surgeon: Kassie Ozell SAUNDERS, MD;  Location: [facility];  Service: Urology;  Laterality: N/A;   RADIOLOGY WITH ANESTHESIA N/A 04/22/2020   Procedure: MRI WITH ANESTHESIA BRAIN WITHOUT CONTRAST;  Surgeon: Radiologist, Medication, MD;  Location: [facility];  Service: Radiology;  Laterality: N/A;   REPLACEMENT TOTAL KNEE Right    right knee   ROTATOR CUFF REPAIR Right 2018   with arthroscopy and distal claviculectomy   TONSILLECTOMY AND ADENOIDECTOMY     TOTAL KNEE ARTHROPLASTY Right 2005   x 4   Family History  Problem Relation Age of Onset   Arthritis Mother    Breast cancer Mother    Mental illness Mother    Colon cancer Mother    Bipolar disorder Mother    Depression Mother    Colon polyps Mother    Lung cancer Father    Prostate cancer Father    Alcohol abuse  Brother    Kidney disease Brother    Esophageal cancer Neg Hx    Social History   Socioeconomic History   Marital status: Married    Spouse name: Not on file   Number of children: Not on file   Years of education: Not on file   Highest education level: Bachelor's degree (e.g., BA, AB, BS)  Occupational History   Not on file  Tobacco Use   Smoking status: Former   Smokeless tobacco: Former    Types: Chew   Tobacco comments:    Quit 50 yrs ago for smoking and quit chewing 2 months ago  Vaping Use   Vaping status: Never Used  Substance and Sexual Activity   Alcohol use: Yes    Alcohol/week: 7.0 standard drinks of alcohol    Types: 7 Standard drinks or equivalent per week    Comment: beer/wine   Drug use: Never   Sexual activity: Not Currently  Other Topics Concern   Not on file  Social History Narrative   Married   Social Drivers of Health   Financial Resource Strain: Low Risk  (03/18/2024)   Overall Financial Resource Strain (CARDIA)    Difficulty of Paying Living Expenses: Not hard at all  Food Insecurity: No Food Insecurity (03/18/2024)   Hunger Vital Sign    Worried About Running Out of Food in the Last Year: Never true    Ran Out of Food in the Last Year: Never true  Transportation Needs: No Transportation Needs (03/18/2024)   PRAPARE - Administrator, Civil Service (Medical): No    Lack of Transportation (Non-Medical): No  Physical Activity: Sufficiently Active (03/18/2024)   Exercise Vital Sign    Days of Exercise per Week: 5 days    Minutes of Exercise per Session: 40 min  Stress: No Stress Concern Present (02/23/2024)   [employer] of Occupational Health - Occupational Stress Questionnaire    Feeling of Stress: Only a little  Social Connections: Socially Integrated (03/18/2024)   Social Connection and Isolation Panel    Frequency of Communication with Friends and Family: More than three times a week    Frequency of Social Gatherings with  Friends and Family: Three times a week    Attends Religious Services: More than 4 times per year    Active Member of Clubs or Organizations: Yes    Attends Banker Meetings: More than 4 times per year    Marital Status: Married     Review of Systems  Constitutional:  Negative for fever and unexpected weight change.  HENT:  Negative for congestion and sinus pressure.   Respiratory:  Negative for cough, chest tightness and shortness of breath.   Cardiovascular:  Negative for chest pain, palpitations and leg  swelling.  Gastrointestinal:        GI symptoms as outlined. Appears to be doing better.   Genitourinary:  Negative for difficulty urinating and dysuria.  Musculoskeletal:  Negative for joint swelling and myalgias.  Skin:  Negative for color change and rash.  Neurological:  Negative for dizziness.       No increased headaches.   Psychiatric/Behavioral:  Negative for agitation and dysphoric mood.        Objective:     BP 122/76   Pulse 79   Temp 98.9 F (37.2 C) (Oral)   Ht 5' 10 (1.778 m)   Wt 164 lb (74.4 kg)   SpO2 98%   BMI 23.53 kg/m  Wt Readings from Last 3 Encounters:  04/25/24 164 lb (74.4 kg)  03/19/24 161 lb (73 kg)  02/24/24 159 lb 9.6 oz (72.4 kg)    Physical Exam Vitals reviewed.  Constitutional:      General: He is not in acute distress.    Appearance: Normal appearance. He is well-developed.  HENT:     Head: Normocephalic and atraumatic.     Right Ear: External ear normal.     Left Ear: External ear normal.     Mouth/Throat:     Pharynx: No oropharyngeal exudate or posterior oropharyngeal erythema.  Eyes:     General: No scleral icterus.       Right eye: No discharge.        Left eye: No discharge.     Conjunctiva/sclera: Conjunctivae normal.  Cardiovascular:     Rate and Rhythm: Normal rate and regular rhythm.  Pulmonary:     Effort: Pulmonary effort is normal. No respiratory distress.     Breath sounds: Normal breath sounds.   Abdominal:     General: Bowel sounds are normal.     Palpations: Abdomen is soft.     Tenderness: There is no abdominal tenderness.  Musculoskeletal:        General: No swelling or tenderness.     Cervical back: Neck supple. No tenderness.  Lymphadenopathy:     Cervical: No cervical adenopathy.  Skin:    Findings: No erythema or rash.  Neurological:     Mental Status: He is alert.  Psychiatric:        Mood and Affect: Mood normal.        Behavior: Behavior normal.         Outpatient Encounter Medications as of 04/25/2024  Medication Sig   acetaminophen  (TYLENOL ) 325 MG tablet Take 325 mg by mouth 4 (four) times daily.   cholecalciferol (VITAMIN D3) 25 MCG (1000 UT) tablet Take 1,000 Units by mouth 3 (three) times a week.    cyanocobalamin  1000 MCG tablet Take 1,000 mcg by mouth daily.    donepezil  (ARICEPT ) 5 MG tablet Take 5 mg by mouth daily.   levocetirizine (XYZAL ) 5 MG tablet Take 1 tablet (5 mg total) by mouth every evening.   Melatonin 10 MG TABS Take by mouth.   meloxicam  (MOBIC ) 7.5 MG tablet Take 7.5 mg by mouth daily. (Patient taking differently: Take 7.5 mg by mouth daily as needed.)   methocarbamol  (ROBAXIN ) 500 MG tablet Take 500 mg by mouth 3 (three) times daily. (Patient taking differently: Take 500 mg by mouth 3 (three) times daily as needed.)   Multiple Vitamin (MULTIVITAMIN) tablet Take 1 tablet by mouth daily. ABC Complete MEN   Oxycodone  HCl 10 MG TABS 10 mg 4 (four) times daily   pantoprazole  (PROTONIX ) 20  MG tablet Take 1 tablet (20 mg total) by mouth daily.   QUEtiapine  (SEROQUEL ) 300 MG tablet Take 0.5-1 tablets (150-300 mg total) by mouth at bedtime.   rosuvastatin  (CRESTOR ) 10 MG tablet TAKE 1 TABLET BY MOUTH DAILY   tadalafil (CIALIS) 5 MG tablet Take 5 mg by mouth daily.   tamsulosin  (FLOMAX ) 0.4 MG CAPS capsule Take 0.4 mg by mouth.   valACYclovir  (VALTREX ) 1000 MG tablet TAKE ONE (1) TABLET BY MOUTH TWO TIMES PER DAY. TAKE FOR ONE DAY AT ONSET OF  BREAKOUT.   [DISCONTINUED] benzonatate  (TESSALON  PERLES) 100 MG capsule Take 1 capsule (100 mg total) by mouth 3 (three) times daily as needed.   [DISCONTINUED] doxycycline  (VIBRA -TABS) 100 MG tablet Take 1 tablet (100 mg total) by mouth 2 (two) times daily.   No facility-administered encounter medications on file as of 04/25/2024.     Lab Results  Component Value Date   WBC 7.2 04/23/2024   HGB 12.5 (L) 04/23/2024   HCT 36.8 (L) 04/23/2024   PLT 225.0 04/23/2024   GLUCOSE 100 (H) 03/19/2024   CHOL 173 03/19/2024   TRIG 86.0 03/19/2024   HDL 89.20 03/19/2024   LDLCALC 67 03/19/2024   ALT 16 03/19/2024   AST 23 03/19/2024   NA 137 03/19/2024   K 4.4 03/19/2024   CL 99 03/19/2024   CREATININE 0.94 03/19/2024   BUN 15 03/19/2024   CO2 30 03/19/2024   TSH 2.04 08/03/2023   PSA 16.07 (H) 09/29/2022   INR 1.03 05/29/2009   HGBA1C 6.0 03/19/2024       Assessment & Plan:  Prostate cancer [facility]) Assessment & Plan: Being followed by oncology. Following - active surveillance.    Anemia, unspecified type Assessment & Plan: Follow [facility].   Orders: -     IBC + Ferritin; Future  Hypercholesteremia Assessment & Plan: Continue crestor .  Low cholesterol diet and exercise. Follow lipid panel.  Lab Results  Component Value Date   CHOL 173 03/19/2024   HDL 89.20 03/19/2024   LDLCALC 67 03/19/2024   TRIG 86.0 03/19/2024   CHOLHDL 2 03/19/2024     Orders: -     Basic metabolic panel with GFR; Future -     Hepatic function panel; Future -     Lipid panel; Future  Hyperglycemia Assessment & Plan: Continue low-carb diet and exercise. Follow A1c.  Lab Results  Component Value Date   HGBA1C 6.0 03/19/2024     Orders: -     Hemoglobin A1c; Future  Nausea Assessment & Plan: Continues on protonix . Avoid doxycycline . Off nicotine now - feels may have been aggravating GI symptoms. Plans f/u with GI.    Mild depression Assessment & Plan: Continue seroquel . Overall stable.  Follow.    Mild cognitive impairment Assessment & Plan: Seeing neurology. Aricept  just increased to 10mg  q day. Follow.       Allena Hamilton, MD

## 2024-04-29 ENCOUNTER — Encounter: Payer: Self-pay | Admitting: Internal Medicine

## 2024-04-29 NOTE — Assessment & Plan Note (Signed)
 Continue seroquel . Overall stable. Follow.

## 2024-04-29 NOTE — Assessment & Plan Note (Signed)
 Continue crestor .  Low cholesterol diet and exercise. Follow lipid panel.  Lab Results  Component Value Date   CHOL 173 03/19/2024   HDL 89.20 03/19/2024   LDLCALC 67 03/19/2024   TRIG 86.0 03/19/2024   CHOLHDL 2 03/19/2024

## 2024-04-29 NOTE — Assessment & Plan Note (Signed)
 Being followed by oncology. Following - active surveillance.

## 2024-04-29 NOTE — Assessment & Plan Note (Signed)
 Seeing neurology. Aricept  just increased to 10mg  q day. Follow.

## 2024-04-29 NOTE — Assessment & Plan Note (Signed)
 Follow [facility].

## 2024-04-29 NOTE — Assessment & Plan Note (Signed)
 Continue low-carb diet and exercise. Follow A1c.  Lab Results  Component Value Date   HGBA1C 6.0 03/19/2024

## 2024-04-29 NOTE — Assessment & Plan Note (Signed)
 Continues on protonix . Avoid doxycycline . Off nicotine now - feels may have been aggravating GI symptoms. Plans f/u with GI.

## 2024-05-02 DIAGNOSIS — J301 Allergic rhinitis due to pollen: Secondary | ICD-10-CM | POA: Diagnosis not present

## 2024-05-15 ENCOUNTER — Telehealth: Payer: Self-pay

## 2024-05-15 MED ORDER — QUETIAPINE FUMARATE 300 MG PO TABS: 150.0000 mg | ORAL_TABLET | Freq: Every day | ORAL | 0 refills | Status: AC

## 2024-05-15 NOTE — Telephone Encounter (Signed)
 Copied from CRM [id number]. Topic: Clinical - Prescription Issue >> May 15, 2024 10:05 AM Charolett L wrote: Reason for CRM: Patient went out of town and left medication home. He stated he just needs 7 pills of the QUEtiapine  (SEROQUEL ) 300 MG tablet [id number] - [place], [place] - [address] [place] [address] Phone: [phone] Fax: [phone] Hours: Not open 24 hours

## 2024-05-15 NOTE — Telephone Encounter (Signed)
 Patient is aware that the Seroquel  has been called in at [facility] [place].

## 2024-05-15 NOTE — Telephone Encounter (Signed)
 Rx sent in for seroquel  #7 tablets to [facility] [place]

## 2024-05-23 ENCOUNTER — Other Ambulatory Visit: Payer: Self-pay | Admitting: Internal Medicine

## 2024-05-25 NOTE — Telephone Encounter (Signed)
 Phone call to pt, he is not currently having an outbreak but his supply of medication to use as needed is expired.  Refill sent in.

## 2024-06-18 ENCOUNTER — Other Ambulatory Visit: Payer: Self-pay | Admitting: Internal Medicine

## 2024-07-11 ENCOUNTER — Ambulatory Visit: Admitting: Internal Medicine

## 2024-08-03 ENCOUNTER — Other Ambulatory Visit

## 2024-08-07 ENCOUNTER — Encounter: Admitting: Internal Medicine

## 2025-02-05 ENCOUNTER — Ambulatory Visit
